# Patient Record
Sex: Male | Born: 1935 | Race: White | Hispanic: No | Marital: Married | Smoking: Former smoker
Health system: Southern US, Community
[De-identification: ages and names within clinical notes are randomized; demographics above are authoritative.]

## PROBLEM LIST (undated history)

## (undated) DIAGNOSIS — Z860100 Personal history of colon polyps, unspecified: Secondary | ICD-10-CM

## (undated) DIAGNOSIS — E782 Mixed hyperlipidemia: Secondary | ICD-10-CM

## (undated) DIAGNOSIS — J449 Chronic obstructive pulmonary disease, unspecified: Secondary | ICD-10-CM

## (undated) DIAGNOSIS — M1990 Unspecified osteoarthritis, unspecified site: Secondary | ICD-10-CM

## (undated) DIAGNOSIS — Q619 Cystic kidney disease, unspecified: Secondary | ICD-10-CM

## (undated) DIAGNOSIS — R079 Chest pain, unspecified: Secondary | ICD-10-CM

## (undated) DIAGNOSIS — I2510 Atherosclerotic heart disease of native coronary artery without angina pectoris: Secondary | ICD-10-CM

## (undated) DIAGNOSIS — Z8719 Personal history of other diseases of the digestive system: Secondary | ICD-10-CM

## (undated) DIAGNOSIS — I219 Acute myocardial infarction, unspecified: Secondary | ICD-10-CM

## (undated) DIAGNOSIS — J42 Unspecified chronic bronchitis: Secondary | ICD-10-CM

## (undated) DIAGNOSIS — C4490 Unspecified malignant neoplasm of skin, unspecified: Secondary | ICD-10-CM

## (undated) DIAGNOSIS — R55 Syncope and collapse: Secondary | ICD-10-CM

## (undated) DIAGNOSIS — G4730 Sleep apnea, unspecified: Secondary | ICD-10-CM

## (undated) DIAGNOSIS — K219 Gastro-esophageal reflux disease without esophagitis: Secondary | ICD-10-CM

## (undated) HISTORY — DX: Personal history of colonic polyps: Z86.010

## (undated) HISTORY — DX: Mixed hyperlipidemia: E78.2

## (undated) HISTORY — DX: Chest pain, unspecified: R07.9

## (undated) HISTORY — PX: CORONARY ANGIOPLASTY: SHX604

## (undated) HISTORY — DX: Personal history of colon polyps, unspecified: Z86.0100

## (undated) HISTORY — PX: SKIN CANCER EXCISION: SHX779

---

## 1996-09-09 HISTORY — PX: CHOLECYSTECTOMY OPEN: SUR202

## 1996-09-09 HISTORY — PX: COLECTOMY: SHX59

## 1999-10-22 ENCOUNTER — Encounter (INDEPENDENT_AMBULATORY_CARE_PROVIDER_SITE_OTHER): Payer: Self-pay | Admitting: Specialist

## 1999-10-22 ENCOUNTER — Other Ambulatory Visit: Admission: RE | Admit: 1999-10-22 | Discharge: 1999-10-22 | Payer: Self-pay | Admitting: Gastroenterology

## 1999-12-05 ENCOUNTER — Inpatient Hospital Stay (HOSPITAL_COMMUNITY): Admission: RE | Admit: 1999-12-05 | Discharge: 1999-12-10 | Payer: Self-pay | Admitting: General Surgery

## 1999-12-05 ENCOUNTER — Encounter (INDEPENDENT_AMBULATORY_CARE_PROVIDER_SITE_OTHER): Payer: Self-pay | Admitting: Specialist

## 2000-01-29 ENCOUNTER — Ambulatory Visit (HOSPITAL_COMMUNITY): Admission: RE | Admit: 2000-01-29 | Discharge: 2000-01-30 | Payer: Self-pay | Admitting: Urology

## 2004-01-27 ENCOUNTER — Encounter: Admission: RE | Admit: 2004-01-27 | Discharge: 2004-01-27 | Payer: Self-pay | Admitting: Orthopedic Surgery

## 2008-04-05 ENCOUNTER — Encounter: Admission: RE | Admit: 2008-04-05 | Discharge: 2008-04-05 | Payer: Self-pay | Admitting: Family Medicine

## 2008-04-25 ENCOUNTER — Ambulatory Visit (HOSPITAL_COMMUNITY): Admission: RE | Admit: 2008-04-25 | Discharge: 2008-04-25 | Payer: Self-pay | Admitting: Urology

## 2008-12-13 ENCOUNTER — Encounter (INDEPENDENT_AMBULATORY_CARE_PROVIDER_SITE_OTHER): Payer: Self-pay | Admitting: *Deleted

## 2009-01-09 ENCOUNTER — Ambulatory Visit: Payer: Self-pay | Admitting: Gastroenterology

## 2009-01-25 ENCOUNTER — Ambulatory Visit: Payer: Self-pay | Admitting: Gastroenterology

## 2010-06-14 ENCOUNTER — Ambulatory Visit: Payer: Self-pay | Admitting: Internal Medicine

## 2010-06-14 DIAGNOSIS — Z86010 Personal history of colonic polyps: Secondary | ICD-10-CM

## 2010-06-14 LAB — CONVERTED CEMR LAB
Albumin: 4.2 g/dL (ref 3.5–5.2)
Alkaline Phosphatase: 64 units/L (ref 39–117)
Basophils Absolute: 0 10*3/uL (ref 0.0–0.1)
Basophils Relative: 0.7 % (ref 0.0–3.0)
CO2: 28 meq/L (ref 19–32)
Calcium: 9.4 mg/dL (ref 8.4–10.5)
Chloride: 103 meq/L (ref 96–112)
Cholesterol: 188 mg/dL (ref 0–200)
Eosinophils Absolute: 0.1 10*3/uL (ref 0.0–0.7)
Glucose, Bld: 100 mg/dL — ABNORMAL HIGH (ref 70–99)
HCT: 41.3 % (ref 39.0–52.0)
Hemoglobin: 14 g/dL (ref 13.0–17.0)
Lymphocytes Relative: 41.5 % (ref 12.0–46.0)
Lymphs Abs: 2.1 10*3/uL (ref 0.7–4.0)
MCHC: 34 g/dL (ref 30.0–36.0)
MCV: 97.9 fL (ref 78.0–100.0)
Monocytes Absolute: 0.4 10*3/uL (ref 0.1–1.0)
Neutro Abs: 2.3 10*3/uL (ref 1.4–7.7)
RBC: 4.22 M/uL (ref 4.22–5.81)
RDW: 13.7 % (ref 11.5–14.6)
Sodium: 138 meq/L (ref 135–145)
TSH: 1.7 microintl units/mL (ref 0.35–5.50)
Total Protein: 6.7 g/dL (ref 6.0–8.3)

## 2010-08-16 ENCOUNTER — Encounter: Payer: Self-pay | Admitting: Internal Medicine

## 2010-09-09 DIAGNOSIS — I219 Acute myocardial infarction, unspecified: Secondary | ICD-10-CM

## 2010-09-09 HISTORY — PX: CORONARY ARTERY BYPASS GRAFT: SHX141

## 2010-09-09 HISTORY — DX: Acute myocardial infarction, unspecified: I21.9

## 2010-09-09 HISTORY — PX: CARDIAC CATHETERIZATION: SHX172

## 2010-09-25 ENCOUNTER — Inpatient Hospital Stay (HOSPITAL_COMMUNITY)
Admission: EM | Admit: 2010-09-25 | Discharge: 2010-10-01 | Payer: Self-pay | Source: Home / Self Care | Attending: Thoracic Surgery (Cardiothoracic Vascular Surgery) | Admitting: Thoracic Surgery (Cardiothoracic Vascular Surgery)

## 2010-09-26 ENCOUNTER — Encounter: Payer: Self-pay | Admitting: Thoracic Surgery (Cardiothoracic Vascular Surgery)

## 2010-09-26 LAB — CK TOTAL AND CKMB (NOT AT ARMC)
CK, MB: 6.2 ng/mL (ref 0.3–4.0)
Relative Index: 3.1 — ABNORMAL HIGH (ref 0.0–2.5)
Total CK: 197 U/L (ref 7–232)

## 2010-09-26 LAB — CBC
HCT: 39.1 % (ref 39.0–52.0)
Hemoglobin: 13.3 g/dL (ref 13.0–17.0)
MCH: 31.6 pg (ref 26.0–34.0)
MCHC: 34 g/dL (ref 30.0–36.0)
MCV: 92.9 fL (ref 78.0–100.0)
Platelets: 164 10*3/uL (ref 150–400)
RBC: 4.21 MIL/uL — ABNORMAL LOW (ref 4.22–5.81)
RDW: 12.9 % (ref 11.5–15.5)
WBC: 5.4 10*3/uL (ref 4.0–10.5)

## 2010-09-26 LAB — DIFFERENTIAL
Basophils Absolute: 0.1 10*3/uL (ref 0.0–0.1)
Basophils Relative: 1 % (ref 0–1)
Eosinophils Absolute: 0.2 10*3/uL (ref 0.0–0.7)
Eosinophils Relative: 3 % (ref 0–5)
Lymphocytes Relative: 35 % (ref 12–46)
Lymphs Abs: 1.9 10*3/uL (ref 0.7–4.0)
Monocytes Absolute: 0.5 10*3/uL (ref 0.1–1.0)
Monocytes Relative: 9 % (ref 3–12)
Neutro Abs: 2.8 10*3/uL (ref 1.7–7.7)
Neutrophils Relative %: 52 % (ref 43–77)

## 2010-09-26 LAB — BASIC METABOLIC PANEL
BUN: 13 mg/dL (ref 6–23)
CO2: 28 mEq/L (ref 19–32)
Calcium: 9 mg/dL (ref 8.4–10.5)
Chloride: 103 mEq/L (ref 96–112)
Creatinine, Ser: 0.77 mg/dL (ref 0.4–1.5)
GFR calc Af Amer: >60 mL/min (ref >60)
GFR calc non Af Amer: >60 mL/min (ref >60)
Glucose, Bld: 117 mg/dL — ABNORMAL HIGH (ref 70–99)
Potassium: 4 mEq/L (ref 3.5–5.1)
Sodium: 138 mEq/L (ref 135–145)

## 2010-09-26 LAB — PROTIME-INR
INR: 0.97 (ref 0.00–1.49)
Prothrombin Time: 13.1 seconds (ref 11.6–15.2)

## 2010-09-26 LAB — APTT: aPTT: 33 seconds (ref 24–37)

## 2010-09-26 LAB — TROPONIN I: Troponin I: 0.08 ng/mL — ABNORMAL HIGH (ref 0.00–0.06)

## 2010-09-28 ENCOUNTER — Encounter: Payer: Self-pay | Admitting: Cardiovascular Disease

## 2010-10-01 LAB — GLUCOSE, CAPILLARY
Glucose-Capillary: 105 mg/dL — ABNORMAL HIGH (ref 70–99)
Glucose-Capillary: 107 mg/dL — ABNORMAL HIGH (ref 70–99)
Glucose-Capillary: 127 mg/dL — ABNORMAL HIGH (ref 70–99)
Glucose-Capillary: 98 mg/dL (ref 70–99)
Glucose-Capillary: 151 mg/dL — ABNORMAL HIGH (ref 70–99)

## 2010-10-01 LAB — POCT I-STAT 4, (NA,K, GLUC, HGB,HCT): Hemoglobin: 8.8 g/dL — ABNORMAL LOW (ref 13.0–17.0)

## 2010-10-01 LAB — POCT I-STAT 3, ART BLOOD GAS (G3+)
Acid-Base Excess: 1 mmol/L (ref 0.0–2.0)
Bicarbonate: 26.4 mEq/L — ABNORMAL HIGH (ref 20.0–24.0)
Bicarbonate: 25.9 mEq/L — ABNORMAL HIGH (ref 20.0–24.0)
O2 Saturation: 99 %
Patient temperature: 37.4
TCO2: 28 mmol/L (ref 0–100)
pCO2 arterial: 43.1 mmHg (ref 35.0–45.0)
pH, Arterial: 7.385 (ref 7.350–7.450)
pO2, Arterial: 161 mmHg — ABNORMAL HIGH (ref 80.0–100.0)

## 2010-10-01 LAB — BASIC METABOLIC PANEL
BUN: 11 mg/dL (ref 6–23)
BUN: 11 mg/dL (ref 6–23)
CO2: 26 mEq/L (ref 19–32)
CO2: 27 mEq/L (ref 19–32)
Calcium: 8.4 mg/dL (ref 8.4–10.5)
Calcium: 8.6 mg/dL (ref 8.4–10.5)
Chloride: 103 mEq/L (ref 96–112)
Creatinine, Ser: 0.86 mg/dL (ref 0.4–1.5)
GFR calc Af Amer: >60 mL/min (ref >60)
GFR calc non Af Amer: >60 mL/min (ref >60)
Glucose, Bld: 123 mg/dL — ABNORMAL HIGH (ref 70–99)
Glucose, Bld: 135 mg/dL — ABNORMAL HIGH (ref 70–99)
Potassium: 4 mEq/L (ref 3.5–5.1)
Sodium: 140 mEq/L (ref 135–145)
Sodium: 139 mEq/L (ref 135–145)

## 2010-10-01 LAB — BLOOD GAS, ARTERIAL
Acid-Base Excess: 2.4 mmol/L — ABNORMAL HIGH (ref 0.0–2.0)
Bicarbonate: 26.8 mEq/L — ABNORMAL HIGH (ref 20.0–24.0)
Drawn by: 331001
FIO2: 0.21 %
O2 Saturation: 80.3 %
Patient temperature: 98.6
TCO2: 28.1 mmol/L (ref 0–100)
pCO2 arterial: 44.2 mmHg (ref 35.0–45.0)
pH, Arterial: 7.399 (ref 7.350–7.450)
pO2, Arterial: 44.5 mmHg — ABNORMAL LOW (ref 80.0–100.0)

## 2010-10-01 LAB — COMPREHENSIVE METABOLIC PANEL
ALT: 17 U/L (ref 0–53)
AST: 19 U/L (ref 0–37)
Albumin: 4.2 g/dL (ref 3.5–5.2)
Alkaline Phosphatase: 63 U/L (ref 39–117)
BUN: 14 mg/dL (ref 6–23)
CO2: 29 mEq/L (ref 19–32)
Calcium: 9.6 mg/dL (ref 8.4–10.5)
Chloride: 101 mEq/L (ref 96–112)
Creatinine, Ser: 0.94 mg/dL (ref 0.4–1.5)
GFR calc Af Amer: >60 mL/min (ref >60)
GFR calc non Af Amer: >60 mL/min (ref >60)
Glucose, Bld: 122 mg/dL — ABNORMAL HIGH (ref 70–99)
Potassium: 4.7 mEq/L (ref 3.5–5.1)
Sodium: 141 mEq/L (ref 135–145)
Total Bilirubin: 0.7 mg/dL (ref 0.3–1.2)
Total Protein: 6.9 g/dL (ref 6.0–8.3)

## 2010-10-01 LAB — URINALYSIS, ROUTINE W REFLEX MICROSCOPIC
Bilirubin Urine: NEGATIVE
Hgb urine dipstick: NEGATIVE
Ketones, ur: NEGATIVE mg/dL
Nitrite: NEGATIVE
Protein, ur: NEGATIVE mg/dL
Specific Gravity, Urine: 1.012 (ref 1.005–1.030)
Urine Glucose, Fasting: NEGATIVE mg/dL
Urobilinogen, UA: 0.2 mg/dL (ref 0.0–1.0)
pH: 6 (ref 5.0–8.0)

## 2010-10-01 LAB — CBC
HCT: 42.8 % (ref 39.0–52.0)
HCT: 25.9 % — ABNORMAL LOW (ref 39.0–52.0)
HCT: 25.4 % — ABNORMAL LOW (ref 39.0–52.0)
HCT: 29 % — ABNORMAL LOW (ref 39.0–52.0)
HCT: 27.8 % — ABNORMAL LOW (ref 39.0–52.0)
HCT: 29.8 % — ABNORMAL LOW (ref 39.0–52.0)
Hemoglobin: 14.4 g/dL (ref 13.0–17.0)
Hemoglobin: 8.8 g/dL — ABNORMAL LOW (ref 13.0–17.0)
Hemoglobin: 8.6 g/dL — ABNORMAL LOW (ref 13.0–17.0)
Hemoglobin: 9.4 g/dL — ABNORMAL LOW (ref 13.0–17.0)
Hemoglobin: 10.2 g/dL — ABNORMAL LOW (ref 13.0–17.0)
MCH: 31.5 pg (ref 26.0–34.0)
MCH: 31.7 pg (ref 26.0–34.0)
MCH: 31.5 pg (ref 26.0–34.0)
MCH: 31.4 pg (ref 26.0–34.0)
MCH: 31.8 pg (ref 26.0–34.0)
MCH: 32.5 pg (ref 26.0–34.0)
MCHC: 33.6 g/dL (ref 30.0–36.0)
MCHC: 34 g/dL (ref 30.0–36.0)
MCHC: 33.9 g/dL (ref 30.0–36.0)
MCHC: 33.8 g/dL (ref 30.0–36.0)
MCHC: 33.8 g/dL (ref 30.0–36.0)
MCHC: 34.2 g/dL (ref 30.0–36.0)
MCV: 93.7 fL (ref 78.0–100.0)
MCV: 93.2 fL (ref 78.0–100.0)
MCV: 93 fL (ref 78.0–100.0)
MCV: 92.9 fL (ref 78.0–100.0)
MCV: 93.9 fL (ref 78.0–100.0)
MCV: 94.9 fL (ref 78.0–100.0)
Platelets: 183 10*3/uL (ref 150–400)
Platelets: 87 10*3/uL — ABNORMAL LOW (ref 150–400)
Platelets: 109 10*3/uL — ABNORMAL LOW (ref 150–400)
Platelets: 117 10*3/uL — ABNORMAL LOW (ref 150–400)
RBC: 4.57 MIL/uL (ref 4.22–5.81)
RBC: 2.78 MIL/uL — ABNORMAL LOW (ref 4.22–5.81)
RBC: 3.14 MIL/uL — ABNORMAL LOW (ref 4.22–5.81)
RDW: 12.9 % (ref 11.5–15.5)
RDW: 12.8 % (ref 11.5–15.5)
RDW: 12.8 % (ref 11.5–15.5)
RDW: 12.9 % (ref 11.5–15.5)
RDW: 13.1 % (ref 11.5–15.5)
RDW: 13.3 % (ref 11.5–15.5)
WBC: 5.2 10*3/uL (ref 4.0–10.5)
WBC: 4.9 10*3/uL (ref 4.0–10.5)
WBC: 7.2 10*3/uL (ref 4.0–10.5)
WBC: 8.4 10*3/uL (ref 4.0–10.5)

## 2010-10-01 LAB — LIPID PANEL
Cholesterol: 161 mg/dL (ref 0–200)
Cholesterol: 178 mg/dL (ref 0–200)
HDL: 46 mg/dL (ref >39)
HDL: 52 mg/dL (ref >39)
LDL Cholesterol: 109 mg/dL — ABNORMAL HIGH (ref 0–99)
LDL Cholesterol: 106 mg/dL — ABNORMAL HIGH (ref 0–99)
Total CHOL/HDL Ratio: 3.5 RATIO
Total CHOL/HDL Ratio: 3.4 RATIO
Triglycerides: 30 mg/dL (ref <150)
Triglycerides: 99 mg/dL (ref <150)
VLDL: 6 mg/dL (ref 0–40)
VLDL: 20 mg/dL (ref 0–40)

## 2010-10-01 LAB — HEMOGLOBIN A1C
Hgb A1c MFr Bld: 6.4 % — ABNORMAL HIGH (ref <5.7)
Hgb A1c MFr Bld: 6.2 % — ABNORMAL HIGH (ref <5.7)
Mean Plasma Glucose: 137 mg/dL — ABNORMAL HIGH (ref <117)
Mean Plasma Glucose: 131 mg/dL — ABNORMAL HIGH (ref <117)

## 2010-10-01 LAB — POCT I-STAT, CHEM 8
BUN: 9 mg/dL (ref 6–23)
BUN: 14 mg/dL (ref 6–23)
Calcium, Ion: 1.22 mmol/L (ref 1.12–1.32)
Calcium, Ion: 1.31 mmol/L (ref 1.12–1.32)
Chloride: 106 mEq/L (ref 96–112)
Chloride: 100 mEq/L (ref 96–112)
Creatinine, Ser: 0.6 mg/dL (ref 0.4–1.5)
Glucose, Bld: 145 mg/dL — ABNORMAL HIGH (ref 70–99)
TCO2: 25 mmol/L (ref 0–100)

## 2010-10-01 LAB — PREPARE PLATELETS: Unit division: 0

## 2010-10-01 LAB — MAGNESIUM: Magnesium: 2.2 mg/dL (ref 1.5–2.5)

## 2010-10-01 LAB — TYPE AND SCREEN
ABO/RH(D): O POS
Antibody Screen: NEGATIVE

## 2010-10-01 LAB — CARDIAC PANEL(CRET KIN+CKTOT+MB+TROPI)
CK, MB: 5.5 ng/mL — ABNORMAL HIGH (ref 0.3–4.0)
Relative Index: 3.3 — ABNORMAL HIGH (ref 0.0–2.5)
Total CK: 168 U/L (ref 7–232)
Troponin I: 0.19 ng/mL — ABNORMAL HIGH (ref 0.00–0.06)

## 2010-10-01 LAB — HEMOGLOBIN AND HEMATOCRIT, BLOOD
HCT: 26.6 % — ABNORMAL LOW (ref 39.0–52.0)
Hemoglobin: 9 g/dL — ABNORMAL LOW (ref 13.0–17.0)

## 2010-10-01 LAB — ABO/RH: ABO/RH(D): O POS

## 2010-10-01 LAB — PROTIME-INR
INR: 1.37 (ref 0.00–1.49)
Prothrombin Time: 17.1 seconds — ABNORMAL HIGH (ref 11.6–15.2)

## 2010-10-01 LAB — PLATELET COUNT: Platelets: 99 10*3/uL — ABNORMAL LOW (ref 150–400)

## 2010-10-01 LAB — APTT: aPTT: 37 seconds (ref 24–37)

## 2010-10-01 NOTE — Op Note (Signed)
Michael Johnston, Michael Johnston             ACCOUNT NO.:  [id number]  MEDICAL RECORD NO.:  [id number]          PATIENT TYPE:  INP  LOCATION:  2313                         FACILITY:  [facility]  PHYSICIAN:  Salvatore Decent. Cornelius Moras, M.D. DATE OF BIRTH:  25-Jan-1932  DATE OF PROCEDURE:  09/27/2010 DATE OF DISCHARGE:                              OPERATIVE REPORT   PREOPERATIVE DIAGNOSIS:  Severe two-vessel coronary artery disease with acute coronary syndrome.  POSTOPERATIVE DIAGNOSIS:  Severe two-vessel coronary artery disease with acute coronary syndrome.  PROCEDURE:  Median sternotomy for coronary artery bypass grafting x3 (left internal mammary artery to distal left anterior descending coronary artery, saphenous vein graft to first diagonal branch, saphenous vein graft to first obtuse marginal branch, endoscopic saphenous vein harvest from right thigh).  SURGEON:  Salvatore Decent. Cornelius Moras, MD  ASSISTANT:  Rowe Clack, PA-C  ANESTHESIA:  Guadalupe Maple, MD  BRIEF CLINICAL NOTE:  The patient is a 77 year old gentleman with no previous cardiac risk factors who presents with unstable angina.  The patient is admitted to the hospital and cardiac enzymes ruled in for a mild non-ST-segment elevation myocardial infarction.  Cardiac catheterization performed by Dr. Lorine Bears demonstrates severe two- vessel coronary artery disease with preserved left ventricular function. Coronary anatomy is relatively unfavorable for percutaneous coronary intervention.  A full consultation note has been dictated previously. The patient and his family understand and accept all associated risks of surgery and desire to proceed as described.  OPERATIVE FINDINGS: 1. Normal left ventricular function. 2. Good-quality left internal mammary artery and saphenous vein     conduit for grafting. 3. Good-quality target vessels for grafting.  OPERATIVE PROCEDURE IN DETAIL:  The patient was brought to the operating room on the  above-mentioned date and central monitoring was established by the Anesthesia Team under the care and direction of Dr. Kipp Brood. Specifically, a Swan-Ganz catheter was placed through the right internal jugular approach.  A radial arterial line was placed.  Intravenous antibiotics were administered.  Following induction with general endotracheal anesthesia, a Foley catheter was placed.  The patient's chest, abdomen, both groins, and both lower extremities were prepared and draped in a sterile manner.  Baseline transesophageal echocardiogram was performed by Dr. Noreene Larsson.  This demonstrates normal left ventricular function.  No other significant abnormalities are noted.  A median sternotomy incision was performed and the left internal mammary artery was dissected from the chest wall and prepared for bypass grafting.  The left internal mammary artery was a good-quality conduit. Simultaneously, saphenous vein was obtained from the patient's right thigh using endoscopic vein harvest technique through a small incision made just below the right knee.  The saphenous vein was a good-quality conduit.  After the saphenous vein has been removed, the small incision just below the knee was closed with absorbable suture.  Following systemic heparinization, the left internal mammary artery was transected distally and noted to have excellent flow.  The pericardium was opened.  The ascending aorta was normal in appearance.  The ascending aorta and right atrium were cannulated for cardiopulmonary bypass.  Cardiopulmonary bypass was begun and the surface of the heart was  inspected.  Distal target vessels were selected for coronary bypass grafting.  A temperature probe was placed in the left ventricular septum.  A cardioplegic cannula was placed in the ascending aorta.  The patient is allowed to cool passively to 32 degrees systemic temperature.  The aortic crossclamp was applied and cold  blood cardioplegia was delivered in antegrade fashion through the aortic root. Iced saline slush was applied for topical hypothermia.  The initial cardioplegic arrest and myocardial cooling was notably excellent. Repeat doses of cardioplegia are administered intermittently throughout the entire crossclamp portion of the operation through the aortic root and down the subsequently placed vein graft to maintain completely flat electrocardiogram and left ventricular septal myocardial temperature below 15 degrees centigrade.  Myocardial protection was felt to be excellent.  The following distal coronary anastomoses were performed: 1. The first obtuse marginal branch of the left circumflex coronary     artery was grafted with a saphenous vein graft in an end-to-side     fashion.  This vessel was a large bifurcating obtuse marginal     branch.  The medial subbranch was grafted.  The site of distal     grafting measures 2 mm in diameter and was of good quality. 2. The first diagonal branch of the left anterior descending coronary     artery was grafted with a saphenous vein graft in an end-to-side     fashion.  This vessel measured 1.5 mm in diameter and was a fair-     quality target vessel for grafting.  There was diffuse plaque     posteriorly. 3. The distal left anterior descending coronary artery was grafted     with left internal mammary artery in an end-to-side fashion.  This     vessel measures 2.0 mm in diameter and was a good-quality target     vessel for grafting.  Both proximal saphenous vein anastomoses were performed directly to the ascending aorta prior to removal of the aortic crossclamp.  The left ventricular septal temperature rises rapidly with reperfusion of the left internal mammary artery graft.  The aortic crossclamp was removed after total crossclamp time of 52 minutes.  The heart was cardioverted and normal sinus rhythm was resumed.  All proximal and distal  coronary anastomoses were inspected for hemostasis and appropriate graft orientation.  Epicardial pacing wires were fixed to the right ventricular outflow tract into the right atrial appendage.  The patient was rewarmed to 37 degrees centigrade temperature.  The patient was weaned from cardiopulmonary bypass without difficulty.  The patient's rhythm at separation from bypass is normal sinus rhythm.  Atrial pacing was employed to increase the heart rate.  No inotropic support was required.  Total cardiopulmonary bypass time for the operation was 68 minutes.  Followup transesophageal echocardiogram performed by Dr. Noreene Larsson after separation from bypass demonstrates normal left ventricular function.  The venous and arterial cannulae were removed uneventfully.  Protamine was administered to reverse anticoagulation.  The mediastinum and the left chest were irrigated with saline solution.  Meticulous surgical hemostasis was ascertained.  There was mild coagulopathy.  The patient was transfused 1 pack of platelets due to coagulopathy with thrombocytopenia.  The mediastinum and left pleural space were drained with 3 chest tubes placed through separate stab incisions inferiorly.  The median sternotomy was closed in routine fashion.  The sternum was closed using double-strength sternal wire.  Soft tissues anterior to the sternum were closed in multiple layers and the skin was closed with a  running subcuticular skin closure.  The patient tolerated the procedure well and was transported to Surgical Intensive Care Unit in stable condition.  There were no intraoperative complications.  All sponge, instrument, and needle counts were verified correct at completion of the operation.     Salvatore Decent. Cornelius Moras, M.D.     CHO/MEDQ  D:  09/27/2010  T:  09/28/2010  Job:  [id number]  cc:   Lorine Bears, MD Noralyn Pick. Eden Emms, MD, [facility] Gordy Savers, MD  Electronically Signed by Tressie Stalker M.D. on  10/01/2010 07:49:33 AM

## 2010-10-02 LAB — BASIC METABOLIC PANEL
BUN: 12 mg/dL (ref 6–23)
BUN: 16 mg/dL (ref 6–23)
BUN: 18 mg/dL (ref 6–23)
BUN: 18 mg/dL (ref 6–23)
CO2: 28 mEq/L (ref 19–32)
CO2: 29 mEq/L (ref 19–32)
CO2: 29 mEq/L (ref 19–32)
CO2: 29 mEq/L (ref 19–32)
Calcium: 8.2 mg/dL — ABNORMAL LOW (ref 8.4–10.5)
Calcium: 8.4 mg/dL (ref 8.4–10.5)
Calcium: 8.4 mg/dL (ref 8.4–10.5)
Chloride: 101 mEq/L (ref 96–112)
Chloride: 102 mEq/L (ref 96–112)
Chloride: 103 mEq/L (ref 96–112)
Chloride: 104 mEq/L (ref 96–112)
Creatinine, Ser: 0.81 mg/dL (ref 0.4–1.5)
Creatinine, Ser: 0.95 mg/dL (ref 0.4–1.5)
Creatinine, Ser: 0.88 mg/dL (ref 0.4–1.5)
Creatinine, Ser: 0.86 mg/dL (ref 0.4–1.5)
GFR calc Af Amer: >60 mL/min (ref >60)
GFR calc Af Amer: >60 mL/min (ref >60)
GFR calc Af Amer: >60 mL/min (ref >60)
GFR calc non Af Amer: >60 mL/min (ref >60)
GFR calc non Af Amer: >60 mL/min (ref >60)
GFR calc non Af Amer: >60 mL/min (ref >60)
Glucose, Bld: 123 mg/dL — ABNORMAL HIGH (ref 70–99)
Glucose, Bld: 162 mg/dL — ABNORMAL HIGH (ref 70–99)
Glucose, Bld: 178 mg/dL — ABNORMAL HIGH (ref 70–99)
Potassium: 4 mEq/L (ref 3.5–5.1)
Potassium: 4.3 mEq/L (ref 3.5–5.1)
Potassium: 4.1 mEq/L (ref 3.5–5.1)
Sodium: 137 mEq/L (ref 135–145)
Sodium: 138 mEq/L (ref 135–145)
Sodium: 138 mEq/L (ref 135–145)

## 2010-10-02 LAB — POCT I-STAT 4, (NA,K, GLUC, HGB,HCT)
Glucose, Bld: 100 mg/dL — ABNORMAL HIGH (ref 70–99)
Glucose, Bld: 159 mg/dL — ABNORMAL HIGH (ref 70–99)
HCT: 36 % — ABNORMAL LOW (ref 39.0–52.0)
HCT: 36 % — ABNORMAL LOW (ref 39.0–52.0)
HCT: 26 % — ABNORMAL LOW (ref 39.0–52.0)
HCT: 28 % — ABNORMAL LOW (ref 39.0–52.0)
Hemoglobin: 12.2 g/dL — ABNORMAL LOW (ref 13.0–17.0)
Hemoglobin: 8.5 g/dL — ABNORMAL LOW (ref 13.0–17.0)
Hemoglobin: 8.8 g/dL — ABNORMAL LOW (ref 13.0–17.0)
Potassium: 4.1 mEq/L (ref 3.5–5.1)
Potassium: 4.2 mEq/L (ref 3.5–5.1)
Potassium: 4.8 mEq/L (ref 3.5–5.1)
Sodium: 138 mEq/L (ref 135–145)
Sodium: 137 mEq/L (ref 135–145)
Sodium: 136 mEq/L (ref 135–145)

## 2010-10-02 LAB — CBC
HCT: 25 % — ABNORMAL LOW (ref 39.0–52.0)
HCT: 29.1 % — ABNORMAL LOW (ref 39.0–52.0)
HCT: 26.5 % — ABNORMAL LOW (ref 39.0–52.0)
Hemoglobin: 8.3 g/dL — ABNORMAL LOW (ref 13.0–17.0)
Hemoglobin: 9.5 g/dL — ABNORMAL LOW (ref 13.0–17.0)
Hemoglobin: 9.1 g/dL — ABNORMAL LOW (ref 13.0–17.0)
Hemoglobin: 8.9 g/dL — ABNORMAL LOW (ref 13.0–17.0)
MCH: 31.8 pg (ref 26.0–34.0)
MCH: 31.3 pg (ref 26.0–34.0)
MCH: 31.7 pg (ref 26.0–34.0)
MCH: 32 pg (ref 26.0–34.0)
MCHC: 33.2 g/dL (ref 30.0–36.0)
MCHC: 32.6 g/dL (ref 30.0–36.0)
MCHC: 33.6 g/dL (ref 30.0–36.0)
MCV: 95.8 fL (ref 78.0–100.0)
MCV: 95.7 fL (ref 78.0–100.0)
MCV: 94.1 fL (ref 78.0–100.0)
MCV: 95.3 fL (ref 78.0–100.0)
Platelets: 98 10*3/uL — ABNORMAL LOW (ref 150–400)
Platelets: 118 10*3/uL — ABNORMAL LOW (ref 150–400)
Platelets: 115 10*3/uL — ABNORMAL LOW (ref 150–400)
Platelets: 135 10*3/uL — ABNORMAL LOW (ref 150–400)
RBC: 2.61 MIL/uL — ABNORMAL LOW (ref 4.22–5.81)
RBC: 3.04 MIL/uL — ABNORMAL LOW (ref 4.22–5.81)
RBC: 2.87 MIL/uL — ABNORMAL LOW (ref 4.22–5.81)
RBC: 2.78 MIL/uL — ABNORMAL LOW (ref 4.22–5.81)
RDW: 13.3 % (ref 11.5–15.5)
RDW: 13.1 % (ref 11.5–15.5)
RDW: 13 % (ref 11.5–15.5)
WBC: 7.1 10*3/uL (ref 4.0–10.5)
WBC: 7 10*3/uL (ref 4.0–10.5)
WBC: 5.8 10*3/uL (ref 4.0–10.5)

## 2010-10-02 LAB — GLUCOSE, CAPILLARY: Glucose-Capillary: 160 mg/dL — ABNORMAL HIGH (ref 70–99)

## 2010-10-02 LAB — POCT I-STAT 3, ART BLOOD GAS (G3+)
TCO2: 26 mmol/L (ref 0–100)
pH, Arterial: 7.424 (ref 7.350–7.450)

## 2010-10-04 LAB — POCT ACTIVATED CLOTTING TIME: Activated Clotting Time: 270 seconds

## 2010-10-11 NOTE — Letter (Signed)
Summary: [facility]  [facility]   Imported By: Lennie Odor 08/29/2010 14:57:17  _____________________________________________________________________  External Attachment:    Type:   Image     Comment:   External Document

## 2010-10-11 NOTE — Letter (Signed)
Summary: [facility]  [facility]   Imported By: Maryln Gottron 09/28/2010 13:25:09  _____________________________________________________________________  External Attachment:    Type:   Image     Comment:   External Document

## 2010-10-11 NOTE — Assessment & Plan Note (Signed)
Summary: NEW MEDICARE PT//[facility]   Vital Signs:  Patient profile:   77 year old male Height:      71.5 inches Weight:      201 pounds BMI:     27.74 Temp:     98.2 degrees F oral BP sitting:   110 / 74  (right arm) Cuff size:   regular  Vitals Entered By: Duard Brady LPN (June 14, 2010 1:04 PM) CC: new to establish  Is Patient Diabetic? No   CC:  new to establish .  History of Present Illness: 34 -year-old patient who is seen today to establish with our practice.  He enjoys excellent health and takes no chronic medications.  He has not followed previously by [facility] family practice.  No concerns or complaints today.  Here for Medicare AWV:  1.   Risk factors based on Past M, S, F history:  no cardiovascular risk factors 2.   Physical Activities: remains quite active, plays occasional golf 3.   Depression/mood: no history of depression or mood disorder 4.   Hearing: no deficits 5.   ADL's: independent in all aspects of daily living 6.   Fall Risk: low 7.   Home Safety: no problems identified 8.   Height, weight, &visual acuity:height and weight are stable.  No change in visual acuity 9.   Counseling: heart healthy diet regular.  Exercise encouraged 10.   Labs ordered based on risk factors:  laboratory profile will be reviewed 11.           Referral Coordination- urology follow-up later this month as scheduled 12.           Care Plan- heart healthy diet, exercise regimen encouraged 13.            Cognitive Assessment- alert and oriented, with normal affect.  No history of cognitive impairment able to handle all executive functions without difficulty   Preventive Screening-Counseling & Management  Alcohol-Tobacco     Smoking Status: never  Allergies (verified): No Known Drug Allergies  Past History:  Past Medical History: Colonic polyps, hx of  Past Surgical History: status post partial colectomy and cholecystectomy, 1998 Derrell Lolling) colonoscopy.  2009  Jarold Motto) history penile implant Earlene Plater)  Family History: Reviewed history and no changes required.  father died age 55 mother died age 70 one sister died in her 24s due to lung cancer  Social History: Reviewed history and no changes required. Married Never Smoked ( no tobacco since teenage years) 6 children 15 grandchildren and 3 great-grandchildren Smoking Status:  never  Review of Systems  The patient denies anorexia, fever, weight loss, weight gain, vision loss, decreased hearing, hoarseness, chest pain, syncope, dyspnea on exertion, peripheral edema, prolonged cough, headaches, hemoptysis, abdominal pain, melena, hematochezia, severe indigestion/heartburn, hematuria, incontinence, genital sores, muscle weakness, suspicious skin lesions, transient blindness, difficulty walking, depression, unusual weight change, abnormal bleeding, enlarged lymph nodes, angioedema, breast masses, and testicular masses.    Physical Exam  General:  Well-developed,well-nourished,in no acute distress; alert,appropriate and cooperative throughout examination Head:  Normocephalic and atraumatic without obvious abnormalities. No apparent alopecia or balding. Eyes:  No corneal or conjunctival inflammation noted. EOMI. Perrla. Funduscopic exam benign, without hemorrhages, exudates or papilledema. Vision grossly normal. Ears:  External ear exam shows no significant lesions or deformities.  Otoscopic examination reveals clear canals, tympanic membranes are intact bilaterally without bulging, retraction, inflammation or discharge. Hearing is grossly normal bilaterally. Mouth:  Oral mucosa and oropharynx without lesions or exudates.  Teeth  in good repair. Neck:  No deformities, masses, or tenderness noted. Chest Wall:  No deformities, masses, tenderness or gynecomastia noted. Breasts:  No masses or gynecomastia noted Lungs:  Normal respiratory effort, chest expands symmetrically. Lungs are clear to  auscultation, no crackles or wheezes. Heart:  Normal rate and regular rhythm. S1 and S2 normal without gallop, murmur, click, rub or other extra sounds. Abdomen:  Bowel sounds positive,abdomen soft and non-tender without masses, organomegaly or hernias noted. Msk:  No deformity or scoliosis noted of thoracic or lumbar spine.   Pulses:  R and L carotid,radial,femoral,dorsalis pedis and posterior tibial pulses are full and equal bilaterally Extremities:  No clubbing, cyanosis, edema, or deformity noted with normal full range of motion of all joints.   Neurologic:  alert & oriented X3, strength normal in all extremities, sensation intact to light touch, gait normal, and finger-to-nose normal.   Skin:  Intact without suspicious lesions or rashes Cervical Nodes:  No lymphadenopathy noted Axillary Nodes:  No palpable lymphadenopathy Inguinal Nodes:  No significant adenopathy Psych:  Cognition and judgment appear intact. Alert and cooperative with normal attention span and concentration. No apparent delusions, illusions, hallucinations   Impression & Recommendations:  Problem # 1:  Preventive Health Care (ICD-V70.0)  Orders: Medicare -1st Annual Wellness Visit [phone]) Venipuncture (95621) TLB-BMP (Basic Metabolic Panel-BMET) (80048-METABOL) TLB-CBC Platelet - w/Differential (85025-CBCD) TLB-Hepatic/Liver Function Pnl (80076-HEPATIC) TLB-TSH (Thyroid Stimulating Hormone) (84443-TSH) TLB-Cholesterol, Total (82465-CHO) Specimen Handling (30865)  Complete Medication List: 1)  No Current Rx Meds   Other Orders: Influenza Vaccine MCR (78469)  Patient Instructions: 1)  urology follow-up as scheduled 2)  Limit your Sodium (Salt). 3)  It is important that you exercise regularly at least 20 minutes 5 times a week. If you develop chest pain, have severe difficulty breathing, or feel very tired , stop exercising immediately and seek medical attention.   Immunizations Administered:  Influenza  Vaccine # 1:    Vaccine Type: Fluvax MCR    Site: left deltoid    Mfr: GlaxoSmithKline    Dose: 0.5 ml    Route: IM    Given by: Duard Brady LPN    Exp. Date: 03/09/2011    Lot #: [id number]    VIS given: 04/03/10 version given June 14, 2010.    Physician counseled: yes  Flu Vaccine Consent Questions:    Do you have a history of severe allergic reactions to this vaccine? no    Any prior history of allergic reactions to egg and/or gelatin? no    Do you have a sensitivity to the preservative Thimersol? no    Do you have a past history of Guillan-Barre Syndrome? no    Do you currently have an acute febrile illness? no    Have you ever had a severe reaction to latex? no    Vaccine information given and explained to patient? yes

## 2010-10-19 ENCOUNTER — Ambulatory Visit
Admission: RE | Admit: 2010-10-19 | Discharge: 2010-10-19 | Disposition: A | Discharge status: Home / Self Care | Payer: MEDICARE | Source: Ambulatory Visit | Attending: Thoracic Surgery (Cardiothoracic Vascular Surgery) | Admitting: Thoracic Surgery (Cardiothoracic Vascular Surgery)

## 2010-10-19 ENCOUNTER — Other Ambulatory Visit: Payer: Self-pay | Admitting: Thoracic Surgery (Cardiothoracic Vascular Surgery)

## 2010-10-19 ENCOUNTER — Encounter: Payer: Self-pay | Admitting: Cardiovascular Disease

## 2010-10-19 ENCOUNTER — Encounter (INDEPENDENT_AMBULATORY_CARE_PROVIDER_SITE_OTHER): Payer: Self-pay | Admitting: Thoracic Surgery (Cardiothoracic Vascular Surgery)

## 2010-10-19 DIAGNOSIS — Z951 Presence of aortocoronary bypass graft: Secondary | ICD-10-CM

## 2010-10-19 DIAGNOSIS — I2510 Atherosclerotic heart disease of native coronary artery without angina pectoris: Secondary | ICD-10-CM

## 2010-10-19 NOTE — Assessment & Plan Note (Signed)
OFFICE VISIT  Michael, Johnston DOB:  August 24, 1932                                        October 19, 2010 CHART #:  [id number]  HISTORY OF PRESENT ILLNESS:  Michael Johnston returns for routine followup, status post coronary artery bypass grafting x3 on September 27, 2010, for severe two-vessel coronary artery disease with acute coronary syndrome. His postoperative recovery has been uncomplicated.  Since hospital discharge he is continued to improve uneventfully and he returns for routine followup today.  He has not yet seen Dr. Eden Emms, for followup office visit, but he has an appointment scheduled next week.  Medications remain unchanged from the time of hospital discharge and include aspirin, Lopressor, and Crestor.  Michael Johnston has not been using any sort of pain relievers other than Tylenol.  PHYSICAL EXAMINATION:  GENERAL:  Notable for a well-appearing male. VITAL SIGNS:  Blood pressure 111/76, pulse 62 and regular, and oxygen saturation 97% on room air. HEENT:  Unrevealing. CHEST:  Examination of chest is notable for median sternotomy incision that is healing nicely.  The sternum is stable on palpation.  Breath sounds are clear to auscultation and symmetrical bilaterally.  No wheezes, rales, or rhonchi noted. CARDIOVASCULAR:  Regular rate and rhythm.  No murmurs, rubs, or gallops are appreciated. ABDOMEN:  Soft and nontender. EXTREMITIES:  Warm and well perfused.  The small incision around the right knee from endoscopic vein harvest is healing nicely.  There is no lower extremity edema.  The remainder of his physical exam is unremarkable.  DIAGNOSTIC TEST:  Chest x-ray performed today at [facility], is reviewed.  This reveals clear lung fields bilaterally.  There are no significant pleural effusions.  All the sternal wires appear intact.  No other abnormalities are noted.  IMPRESSION:  Excellent progress following recent coronary  artery bypass grafting.  PLAN:  I have encouraged Michael Johnston to continue to gradually increase his physical activity with his primary limitation at this point remaining that he refrain from heavy lifting or strenuous use of his arms or shoulders for least another 2 months.  I have strongly encouraged him to get involve in the cardiac rehab program.  I think this would do him some good.  We have not made any changes in his current medications.  All of his questions have been addressed.  Overall he seems to be getting along quite nicely.  In the future, Michael Johnston, will call or return to see [place] as needed.  Salvatore Decent. Cornelius Moras, M.D. Electronically Signed  CHO/MEDQ  D:  10/19/2010  T:  10/19/2010  Job:  [id number]  cc:   Noralyn Pick. Eden Emms, MD, [facility] Lorine Bears, MD Gordy Savers, MD

## 2010-10-22 ENCOUNTER — Encounter: Payer: Self-pay | Admitting: Thoracic Surgery (Cardiothoracic Vascular Surgery)

## 2010-10-22 DIAGNOSIS — R079 Chest pain, unspecified: Secondary | ICD-10-CM | POA: Insufficient documentation

## 2010-10-22 DIAGNOSIS — K219 Gastro-esophageal reflux disease without esophagitis: Secondary | ICD-10-CM | POA: Insufficient documentation

## 2010-10-23 ENCOUNTER — Encounter (INDEPENDENT_AMBULATORY_CARE_PROVIDER_SITE_OTHER): Payer: MEDICARE | Admitting: Cardiovascular Disease

## 2010-10-23 ENCOUNTER — Encounter: Payer: Self-pay | Admitting: Cardiovascular Disease

## 2010-10-23 DIAGNOSIS — E782 Mixed hyperlipidemia: Secondary | ICD-10-CM | POA: Insufficient documentation

## 2010-10-23 DIAGNOSIS — I2510 Atherosclerotic heart disease of native coronary artery without angina pectoris: Secondary | ICD-10-CM

## 2010-10-31 NOTE — Assessment & Plan Note (Signed)
Summary: eph/2 weeks/per dr owens/post open heart surgery/mt appt conf...   CC:  check up.  History of Present Illness: Michael Johnston is seen post hosptial D/C for SSCP/SEMI after getting injections at the dentist office  September 27, 2010.  He was taken to the operating room wherehe underwent coronary artery bypass grafting x3, the following graftswere placed:    Left internal mammary artery to the distal LAD, coronary artery a     saphenous vein graft to the first diagonal branch, and a saphenous     vein graft to the first obtuse marginal branch.  He had endoscopic     saphenous vein harvest from the right thigh.  He tolerated     procedure well, was taken to the surgical intensive care unit in     stable condition.  Operative findings showed normal left     ventricular function, good-quality conduits, good quality targets.  Post op course was unremarkable.  Has had some bronchitis.  Needs lipid and liver in 3 months.  Denies SSCP, palpitaitons, edema or dyspnea.  Has seen Dr. Cornelius Moras in F/u with normal post op CXR  Current Problems (verified): 1)  Chest Pain  (ICD-786.50) 2)  Hiatal Hernia, Hx of  (ICD-V12.79) 3)  Preventive Health Care  (ICD-V70.0) 4)  Colonic Polyps, Hx of  (ICD-V12.72)  Current Medications (verified): 1)  Crestor 10 Mg Tabs (Rosuvastatin Calcium) .... Take One Tablet By Mouth Daily. 2)  Aspirin Ec 325 Mg Tbec (Aspirin) .... Take One Tablet By Mouth Daily 3)  Metoprolol Tartrate 25 Mg Tabs (Metoprolol Tartrate) .... Take One Tablet By Mouth Twice A Day  Allergies (verified): No Known Drug Allergies  Past History:  Past Medical History: Last updated: 10/22/2010 CHEST PAIN  HIATAL HERNIA, HX OF PREVENTIVE HEALTH CARE  COLONIC POLYPS, HX OF   Past Surgical History: Last updated: 10/22/2010 Median sternotomy for coronary artery bypass grafting x3  (left internal mammary artery to distal left anterior descending  coronary artery, saphenous vein graft to first  diagonal branch,  saphenous vein graft to first obtuse marginal branch, endoscopic  saphenous vein harvest from right thigh). SURGEON:  Salvatore Decent. Cornelius Moras, MD  ASSISTANT:  Rowe Clack, PA-C  ANESTHESIA:  Guadalupe Maple, MD   status post partial colectomy and cholecystectomy, 1998 Derrell Lolling) colonoscopy.  2009 Jarold Motto) history penile implant Earlene Plater)  Family History: Last updated: 2010/07/07  father died age 77 mother died age 51 one sister died in her 74s due to lung cancer  Social History: Last updated: July 07, 2010 Married Never Smoked ( no tobacco since teenage years) 6 children 15 grandchildren and 3 great-grandchildren  Review of Systems       Denies fever, malais, weight loss, blurry vision, decreased visual acuity, cough, sputum, SOB, hemoptysis, pleuritic pain, palpitaitons, heartburn, abdominal pain, melena, lower extremity edema, claudication, or rash.   Vital Signs:  Patient profile:   77 year old male Height:      71 inches Weight:      199 pounds BMI:     27.86 Pulse rate:   61 / minute Resp:     14 per minute BP sitting:   119 / 73  (left arm)  Vitals Entered By: Kem Parkinson (October 23, 2010 1:32 PM)  Physical Exam  General:  Affect appropriate Healthy:  appears stated age HEENT: normal Neck supple with no adenopathy JVP normal no bruits no thyromegaly Lungs clear with no wheezing and good diaphragmatic motion Heart:  S1/S2 no murmur,rub, gallop or  click PMI normal Abdomen: benighn, BS positve, no tenderness, no AAA no bruit.  No HSM or HJR Distal pulses intact with no bruits No edema Neuro non-focal Skin warm and dry Rigth vein harvest site A    Impression & Recommendations:  Problem # 1:  CHEST PAIN (ICD-786.50) Assessment Unchanged Severe 2VD wit CABG 1/19 by Dr Cornelius Moras.  Continue ASA and BB His updated medication list for this problem includes:    Aspirin Ec 325 Mg Tbec (Aspirin) .Marland Kitchen... Take one tablet by mouth daily    Metoprolol  Tartrate 25 Mg Tabs (Metoprolol tartrate) .Marland Kitchen... Take one tablet by mouth twice a day  Problem # 2:  MIXED HYPERLIPIDEMIA (ICD-272.2) Continue statin.  Target LDL 75 or less.  Labs in 3 months His updated medication list for this problem includes:    Crestor 10 Mg Tabs (Rosuvastatin calcium) .Marland Kitchen... Take one tablet by mouth daily.  Patient Instructions: 1)  Your physician recommends that you schedule a follow-up appointment in: 3 MONTHS

## 2010-11-01 NOTE — Procedures (Signed)
NAMEBERNABE, DORCE             ACCOUNT NO.:  [id number]  MEDICAL RECORD NO.:  [id number]          PATIENT TYPE:  INP  LOCATION:  6531                         FACILITY:  [facility]  PHYSICIAN:  Lorine Bears, MD     DATE OF BIRTH:  [birth year]  DATE OF PROCEDURE:  09/26/2010 DATE OF DISCHARGE:                           CARDIAC CATHETERIZATION   PROCEDURES PERFORMED: 1. Left heart catheterization. 2. Coronary angiography. 3. Left ventricular angiography. 4. LAD pressure flow wire analysis.  DIAGNOSIS AND CLINICAL HISTORY:  Mr. Current is a 77 year old gentleman with no previous cardiac history.  He is a previous smoker and otherwise does not have any other chronic medical conditions.  He presented to the emergency room with classic symptoms of substernal chest tightness, which lasted for more than an hour.  His electrocardiogram showed no acute changes.  He was ultimately found to have elevated troponin consistent with non-ST elevation myocardial infarction.  Due to his presentation, cardiac catheterization and possible coronary intervention were recommended.  Risks, benefits, and alternatives were discussed with the patient.  ACCESS:  Right radial artery.  STUDY DETAILS:  A standard informed consent was obtained.  The patient was given fentanyl and Versed for sedation.  The right radial area was prepped in a sterile fashion.  It was anesthetized with 1% lidocaine.  A 5-French sheath was placed in the right radial artery.  He was given 4000 units of unfractionated heparin intravenously.  3 mg of verapamil was given through the arterial sheath.  Coronary angiography was performed with TIG catheter, which involved the left main coronary artery, but could not engage the right coronary artery with it.  Thus, for the right coronary artery, I used a JR-4 catheter.  A Pigtail catheter was used for LV angiography.  All catheter exchanges were done over the wire.  Left ventricular  angiography was performed in the RAO 30 position.  Central pressures were recorded in the left ventricle and aorta.  LAD pressure flow wire analysis details:  The patient was found to have borderline significant distal left main stenosis as well as ostial LAD disease.  Thus, it was decided to proceed with a pressure flow wire analysis.  The sheath was exchanged to a 6-French sheath.  He was given additional 3000 units of unfractionated heparin to achieve a therapeutic ACT.  An XB 3.0 guiding catheter was used to engage the left main coronary artery.  A Volcano pressure wire was used in the usual fashion to cross the lesion relatively easily.  The patient was started on IV adenosine 140 mcg/kg per minute.  Interrogation revealed an FFR ratio of 0.77, which indicates physiologic significance.  STUDY FINDINGS:  Hemodynamic findings:  Aortic pressure was 129/68 with a mean pressure of 93 mmHg.  Left ventricular pressure was 134/15 with a left ventricular end-diastolic pressure of 20 mmHg.  Coronary angiography:  Left main coronary artery:  The vessel was normal in size and tapers significantly in the distal segment.  Distally, there is 50% stenosis leading to the left circumflex and LAD.  Left anterior descending artery:  The vessel was normal in size and moderately calcified in the  proximal segment.  In the ostial and proximal LAD, there is a 70% stenosis.  In the mid LAD, there is a diffuse 50% disease.  The distal LAD is relatively free of any significant disease.  First diagonal is a large-sized vessel with diffuse 80% ostial and proximal stenosis.  The vessel gives three medium- sized branches.  It is suitable for grafting.  The second diagonal is a small-sized vessel with an 80% ostial stenosis.  Third diagonal is very small.  Left circumflex artery:  The vessel was normal in size and mildly to moderately calcified in the proximal segment.  It has mild diffuse atherosclerosis.   OM1 is a small-sized vessel and free of significant disease.  OM2 is a very large size vessel and branches into two medium- size vessels.  There is a 90% stenosis in the mid segment before it branches.  Third obtuse marginal is normal size vessel and free of significant disease.  Right coronary artery:  The vessel was normal in size.  There was 40% tubular stenosis in the mid segment and otherwise no obstructive disease.  The vessel is mildly calcified in the mid segment.  STUDY CONCLUSIONS: 1. Significant two-vessel coronary artery disease with borderline     significant left main stenosis. 2. 70% ostial/ proximal LAD stenosis, which was interrogated with     a pressure flow wire.  The FFR ratio was 0.77, which indicates     physiologic significance. 3. Normal LV systolic function.  RECOMMENDATIONS:  Due to the presence of borderline significant left main stenosis as well as ostial and proximal LAD disease, I recommend evaluation for coronary artery bypass graft surgery, which will likely give him the best long-term results.  Targets for grafting include LAD, first diagonal, OM2, and possibly OM3.     Lorine Bears, MD    MA/MEDQ  D:  09/26/2010  T:  09/27/2010  Job:  [id number]  Electronically Signed by Lorine Bears MD on 11/01/2010 11:11:23 AM

## 2010-11-06 NOTE — Progress Notes (Signed)
Summary: [facility]: Office Visit  [facility]: Office Visit   Imported By: Earl Many 10/30/2010 18:09:59  _____________________________________________________________________  External Attachment:    Type:   Image     Comment:   External Document

## 2010-11-12 NOTE — Op Note (Signed)
NAMEAVERIE, MEINER             ACCOUNT NO.:  [id number]  MEDICAL RECORD NO.:  [id number]           PATIENT TYPE:  LOCATION:                                 FACILITY:  PHYSICIAN:  Guadalupe Maple, M.D.  DATE OF BIRTH:  December 30, 1931  DATE OF PROCEDURE:  09/27/2010 DATE OF DISCHARGE:                              OPERATIVE REPORT   PROCEDURE:  Intraoperative transesophageal echocardiography.  Mr. Jacque Garrels is a 77 year old white male admitted on September 26, 2010, with chest pain.  He was found to have severe two-vessel coronary artery disease and normal left ventricular function.  He is now scheduled to undergo coronary artery bypass grafting by Dr. Cornelius Moras. Intraoperative transesophageal echocardiography was requested to evaluate the left and right ventricular function and to serve as a monitor for intraoperative volume status and to assess for any valvular pathology.  The patient was brought to the operating room at [facility] and general anesthesia was induced without difficulty.  The trachea was intubated without difficulty.  Following orogastric suctioning, the transesophageal echocardiography probe was then inserted into the esophagus without difficulty.  IMPRESSION:  Prebypass findings: 1. Aortic valve.  The aortic valve was trileaflet.  The leaflets     opened normally.  There was no restriction to opening and there was     no aortic insufficiency.  No calcification of the leaflets. 2. Mitral valve.  The mitral leaflets coapted well without prolapse or     fluttering.  There was trace mitral insufficiency and a normal-     appearing mitral valve. 3. Left ventricle.  There was normal-appearing left ventricular     function with good contractility in all segments interrogated.     Ejection fraction was estimated at 60-65%.  There was normal left     ventricular wall thickness which measured 0.8-0.95 cm at the mid     papillary level in the short axis view at  end-diastole.  Left     ventricular end-diastolic diameter in the short axis view at the     mid papillary level measured 4.74 cm, biventricular end-systolic     diameter measured 3.45 cm.  There was no thrombus noted in the left     ventricular apex. 4. Right ventricle.  There was normal-appearing right ventricular     function with good contractility of the right ventricular free     wall. 5. Tricuspid valve.  There was trace tricuspid insufficiency and     structurally normal-appearing tricuspid valve. 6. Interatrial septum.  Interatrial septum was intact without evidence     of patent foramen ovale or atrial septal defect by color Doppler     and bubble study. 7. Left atrium.  There was normal-appearing left atrial size with no     thrombus noted in the left atrium or left atrial appendage. 8. Ascending aorta.  The ascending aorta showed mild thickening with     no severe atheromatous disease noted and     no aneurysmal dilatation of the aortic root or proximal ascending     aorta. 9. Descending aorta.  There was mild atheromatous disease noted  in the     descending aorta.  The aorta measured 2.5 cm in diameter.          ______________________________ Guadalupe Maple, M.D.     DCJ/MEDQ  D:  09/27/2010  T:  09/28/2010  Job:  [id number]  Electronically Signed by Kipp Brood M.D. on 11/12/2010 01:55:00 PM

## 2010-12-06 NOTE — Letter (Signed)
Summary: [facility] Office Visit Note    TCT Office Visit Note   Imported By: Roderic Ovens 11/28/2010 10:45:04  _____________________________________________________________________  External Attachment:    Type:   Image     Comment:   External Document

## 2011-01-02 ENCOUNTER — Other Ambulatory Visit: Payer: Self-pay | Admitting: *Deleted

## 2011-01-02 ENCOUNTER — Telehealth: Payer: Self-pay | Admitting: Cardiovascular Disease

## 2011-01-02 MED ORDER — METOPROLOL TARTRATE 25 MG PO TABS: 25.0000 mg | ORAL_TABLET | Freq: Two times a day (BID) | ORAL | Status: DC

## 2011-01-02 NOTE — Telephone Encounter (Signed)
Lopressor 25 mg. [facility] in [place] , [place]

## 2011-01-02 NOTE — Telephone Encounter (Signed)
Walk in Pt Form " Needs Refill on Metoprololl 25mg " sent to Message Nurse 01/02/11/KM

## 2011-01-22 ENCOUNTER — Encounter: Payer: Self-pay | Admitting: *Deleted

## 2011-01-22 ENCOUNTER — Encounter: Payer: Self-pay | Admitting: Cardiovascular Disease

## 2011-01-25 NOTE — Op Note (Signed)
[place]  Patient:    Michael Johnston, Michael Johnston                    MRN: [id number] Proc. Date: 01/29/00 Adm. Date:  [id number] Disc. Date: [id number] Attending:  Nelma Rothman Iii CC:         Vernon Prey, M.D., [place], [place].                           Operative Report  PREOPERATIVE DIAGNOSIS:   Organic impotence.  POSTOPERATIVE DIAGNOSIS:  Organic impotence.  PROCEDURE:  Placement of infrapubic Mentor Alpha I penile prosthesis with 18 cm  cylinders, 2 cm rear-tip extenders bilaterally.  SURGEON:  Lucrezia Starch. Ovidio Hanger, M.D.  ANESTHESIA:  General endotracheal anesthesia  ESTIMATED BLOOD LOSS:  150 cc.  TUBES:  #16-French Foley.  COMPLICATIONS:  None.  INDICATIONS:  The patient is a very nice 77 year old white male who presents with organic impotence.  He has proven blood flow problems to that area.  He has attempted multiple therapies, including Viagra, vacuum erection device, injection therapy, etc. with prostaglandin; and none were to satisfaction.  We have had extensive discussions regarding prosthesis.  He has seen the various ones demonstrated, prototypical models.  After understanding the risks, benefits and  alternatives, he has elected to proceed with placement of the Mentor Alpha I prosthesis, 3-piece inflatable.  DESCRIPTION OF PROCEDURE:  The patient was placed in supine position after proper general endotracheal anesthesia, was prepped and draped with a full 10 minute scrub and prep with Betadine, both scrub and solution. A 16-French Foley catheter was  inserted and the bladder was drained.  A transverse infrapubic incision was made approximately 5 cm in length, and sharp dissection was carried down to the subcutaneous tissue.  The retropubic fascia was then dissected out, as was the corpora of the penis bilaterally.  Incision was made in the linea alba and the space of Retzius was entered, and a pouch was created for the  reservoir.  Next, the two corpora were incised. Each were serially dilated with Hegar dilators to 13 [place], and were measured in length. It was noted that they were approximately 0 cm in length, with 9 cm proximally and 11 cm distally.  It was felt an 18 cm Alpha I penile prosthesis was probably appropriate, with 2 cm RTEs bilaterally.  This was felt to be the appropriate size. Again, it was remeasured several times, and all sizes corroborated.  A scrotal pouch was created on the right side (he is right-handed) and this was created in a sub-Dartos fashion.  The penile prosthesis was then placed with the furlough device in seated position distally and proximally. It was inflated and noted to seat well.  There was absolutely no buckling noted, and we were comfortable with their position and functionality. he corporotomies were then closed with running 3-0 PDS suture, and the fascial incision for the reservoir pump was closed in a similar manner. The scrotal pump was placed in a right sub-Dartos pouch, and noted to be in good position. Appropriate connections were made, 65 cc was placed in the reservoir but it only took 40 cc to completely inflate the prosthesis, and no back-pressure was noted to be on the syringe after the reservoir had been inflated.  Irrigation was performed with antibiotic solution. Good hemostasis was noted to be present. Subcutaneous  tissue was approximated with interrupted 3-0 chromic catgut.  Skin was closed with skin staples. The device was inflated and deflated, and noted to be in excellent position.  All wounds were dressed sterilely. Foley catheter was maintained in position. The bladder was drained, urine was clear.  The patient was taken to the recovery room, stable. DD:  01/29/00 TD:  02/03/00 Job: [id number]

## 2011-01-25 NOTE — H&P (Signed)
[place]  Patient:    Michael Johnston, Michael Johnston                    MRN: [id number] Adm. Date:  [id number] Attending:  Nelma Rothman Iii                         History and Physical  CHIEF COMPLAINT:  "I cant have erections."  HISTORY OF PRESENT ILLNESS:  Mr. Rindfleisch is a very nice 77 year old white male who has had progressive organic impotence.  He has undergone treatment with Viagra, injection therapy, vacuum erection device, and other options and has not been satisfied with any of these options.  He has considered all options carefully and has elected to proceed with a 3-piece penile prosthesis.  We have undergone extensive discussion.  His wife is aware of the current situation and is okay with it, and he has looked at various devices and picked the Mentor 3-piece inflatable penile prosthesis.  PAST MEDICAL HISTORY:  ALLERGIES:  No known allergies.  MEDICATIONS:  None.  ILLNESSES:  He recently had a colon resection.  He has had gallbladder surgery also.  He has a history of a hiatal hernia and has cysts of the kidney, but is otherwise without significant problems.  SOCIAL HISTORY:  Negative smoker, negative drinker.  FAMILY HISTORY:  Not pertinent.  REVIEW OF SYSTEMS:  He has no shortness of breath, dyspnea on exertion, chest pain, or GI complaints and he is without significant pain, diaphoresis, diabetes, etc.  PHYSICAL EXAMINATION:  VITAL SIGNS:  Blood pressure 126/72, pulse 68, respirations 18, temperature 97.4 degrees Fahrenheit.  GENERAL:  He is well-nourished, well-developed, in no acute distress. Oriented x 3.  HEENT:  PERRLA.  Ears, nose and throat clear.  NECK:  Without masses or thyromegaly.  CHEST:  Clear anterior and posterior, without rales or rhonchi.  ABDOMEN:  Soft and nontender with no mass or organomegaly.  EXTREMITIES:  Normal.  NEUROLOGIC:  Intact.  SKIN:  Normal.  GENITALIA:  Penis, meatus, scrotum,  testicle, adnexa, anus, and perineum normal.  RECTAL:  Rectal vault is empty.  Prostate is 25 gm, benign.  IMPRESSION:  Organic impotence.  PLAN:  Mentor penile 3-piece inflatable penile prosthesis implantation. DD:  01/29/00 TD:  01/29/00 Job: [id number]

## 2011-01-28 ENCOUNTER — Encounter: Payer: Self-pay | Admitting: Cardiovascular Disease

## 2011-01-28 ENCOUNTER — Ambulatory Visit (INDEPENDENT_AMBULATORY_CARE_PROVIDER_SITE_OTHER): Payer: Medicare Other | Admitting: Cardiovascular Disease

## 2011-01-28 VITALS — BP 130/74 | HR 56 | Resp 12 | Ht 72.0 in | Wt 192.0 lb

## 2011-01-28 DIAGNOSIS — E782 Mixed hyperlipidemia: Secondary | ICD-10-CM

## 2011-01-28 DIAGNOSIS — E785 Hyperlipidemia, unspecified: Secondary | ICD-10-CM

## 2011-01-28 DIAGNOSIS — Z8249 Family history of ischemic heart disease and other diseases of the circulatory system: Secondary | ICD-10-CM | POA: Insufficient documentation

## 2011-01-28 MED ORDER — SIMVASTATIN 40 MG PO TABS: 40.0000 mg | ORAL_TABLET | Freq: Every evening | ORAL | Status: DC

## 2011-01-28 NOTE — Patient Instructions (Signed)
Your physician recommends that you schedule a follow-up appointment in: YEAR WITH DR [facility]   Your physician has recommended you make the following change in your medication: STOP CRESTOR START SIMVASTATIN  40 MG 1 EVERY DAY   Your physician recommends that you return for lab work in: FASTING  LIPID LIVER IN 6 MONTHS DX 272.4 V58.69

## 2011-01-28 NOTE — Assessment & Plan Note (Signed)
Change to generic statin  Labs in 6 months with Dr Kirtland Bouchard or [place]

## 2011-01-28 NOTE — Assessment & Plan Note (Signed)
Stable no angina  Continue asa and beta blocker

## 2011-01-28 NOTE — Progress Notes (Signed)
Michael Johnston is seen post hosptial D/C for SSCP/SEMI after getting injections at the dentist office  September 27, 2010.  He was taken to the operating room wherehe underwent coronary artery bypass grafting x3, the following graftswere placed:    Left internal mammary artery to the distal LAD, coronary artery a     saphenous vein graft to the first diagonal branch, and a saphenous     vein graft to the first obtuse marginal branch.  He had endoscopic     saphenous vein harvest from the right thigh.  He tolerated     procedure well, was taken to the surgical intensive care unit in     stable condition.  Operative findings showed normal left     ventricular function, good-quality conduits, good quality targets.  Post op course was unremarkable.  Has had some bronchitis.  Needs lipid and liver in 3 months.  Denies SSCP, palpitaitons, edema or dyspnea.  Has seen Dr. Cornelius Johnston in F/u with normal post op CXR  Has some arthritic right shoulder pain.  Wants to golf and due some tilling in the yeard which I thought was ok.    Will switch to generic statin as crestor too expensive  ROS: Denies fever, malais, weight loss, blurry vision, decreased visual acuity, cough, sputum, SOB, hemoptysis, pleuritic pain, palpitaitons, heartburn, abdominal pain, melena, lower extremity edema, claudication, or rash.   General: Affect appropriate Healthy:  appears stated age HEENT: normal Neck supple with no adenopathy JVP normal no bruits no thyromegaly Lungs clear with no wheezing and good diaphragmatic motion Heart:  S1/S2 no murmur,rub, gallop or click PMI normal Abdomen: benighn, BS positve, no tenderness, no AAA no bruit.  No HSM or HJR Distal pulses intact with no bruits No edema Neuro non-focal Skin warm and dry No muscular weakness   Current Outpatient Prescriptions  Medication Sig Dispense Refill  . aspirin 325 MG tablet Take 325 mg by mouth daily.        . metoprolol tartrate (LOPRESSOR) 25 MG tablet Take  1 tablet (25 mg total) by mouth 2 (two) times daily.  60 tablet  11  . DISCONTD: rosuvastatin (CRESTOR) 10 MG tablet Take 10 mg by mouth daily.        . simvastatin (ZOCOR) 40 MG tablet Take 1 tablet (40 mg total) by mouth every evening.  30 tablet  11    Allergies  Review of patient's allergies indicates no known allergies.  Electrocardiogram:  Assessment and Plan

## 2011-07-05 ENCOUNTER — Encounter: Payer: Self-pay | Admitting: Internal Medicine

## 2011-07-05 ENCOUNTER — Ambulatory Visit (INDEPENDENT_AMBULATORY_CARE_PROVIDER_SITE_OTHER): Payer: Medicare Other | Admitting: Internal Medicine

## 2011-07-05 VITALS — BP 120/80 | HR 60 | Temp 97.6°F | Resp 16 | Ht 72.0 in | Wt 194.0 lb

## 2011-07-05 DIAGNOSIS — E782 Mixed hyperlipidemia: Secondary | ICD-10-CM

## 2011-07-05 DIAGNOSIS — Z8249 Family history of ischemic heart disease and other diseases of the circulatory system: Secondary | ICD-10-CM

## 2011-07-05 DIAGNOSIS — Z0000 Encounter for general adult medical examination without abnormal findings: Secondary | ICD-10-CM

## 2011-07-05 DIAGNOSIS — Z23 Encounter for immunization: Secondary | ICD-10-CM

## 2011-07-05 DIAGNOSIS — E785 Hyperlipidemia, unspecified: Secondary | ICD-10-CM

## 2011-07-05 DIAGNOSIS — Z86010 Personal history of colonic polyps: Secondary | ICD-10-CM

## 2011-07-05 DIAGNOSIS — I2510 Atherosclerotic heart disease of native coronary artery without angina pectoris: Secondary | ICD-10-CM

## 2011-07-05 LAB — COMPREHENSIVE METABOLIC PANEL
Albumin: 4.2 g/dL (ref 3.5–5.2)
Alkaline Phosphatase: 59 U/L (ref 39–117)
BUN: 21 mg/dL (ref 6–23)
Calcium: 9.2 mg/dL (ref 8.4–10.5)
Chloride: 107 mEq/L (ref 96–112)
Glucose, Bld: 107 mg/dL — ABNORMAL HIGH (ref 70–99)
Potassium: 5.2 mEq/L — ABNORMAL HIGH (ref 3.5–5.1)
Sodium: 142 mEq/L (ref 135–145)
Total Protein: 6.8 g/dL (ref 6.0–8.3)

## 2011-07-05 LAB — LIPID PANEL
Cholesterol: 119 mg/dL (ref 0–200)
VLDL: 9.8 mg/dL (ref 0.0–40.0)

## 2011-07-05 LAB — CBC WITH DIFFERENTIAL/PLATELET
Eosinophils Absolute: 0.2 10*3/uL (ref 0.0–0.7)
Eosinophils Relative: 3.1 % (ref 0.0–5.0)
Lymphocytes Relative: 35 % (ref 12.0–46.0)
MCHC: 33.6 g/dL (ref 30.0–36.0)
MCV: 98.4 fl (ref 78.0–100.0)
Monocytes Absolute: 0.5 10*3/uL (ref 0.1–1.0)
Neutrophils Relative %: 52.3 % (ref 43.0–77.0)
Platelets: 173 10*3/uL (ref 150.0–400.0)
WBC: 5 10*3/uL (ref 4.5–10.5)

## 2011-07-05 LAB — TSH: TSH: 0.78 u[IU]/mL (ref 0.35–5.50)

## 2011-07-05 MED ORDER — SIMVASTATIN 40 MG PO TABS: 40.0000 mg | ORAL_TABLET | Freq: Every evening | ORAL | Status: DC

## 2011-07-05 MED ORDER — METOPROLOL TARTRATE 25 MG PO TABS: 25.0000 mg | ORAL_TABLET | Freq: Two times a day (BID) | ORAL | Status: DC

## 2011-07-05 NOTE — Patient Instructions (Signed)
Limit your sodium (Salt) intake    It is important that you exercise regularly, at least 20 minutes 3 to 4 times per week.  If you develop chest pain or shortness of breath seek  medical attention.  Return in one year for follow-up   

## 2011-07-05 NOTE — Progress Notes (Signed)
Subjective:    Patient ID: Michael Johnston, male    DOB: 02-13-1932, 77 y.o.   MRN: [id number]  HPI  77 year old patient who is seen today for a wellness exam. He is followed closely by cardiology and is status post CABG in January of this year. No concerns or complaints.  He is followed by urology semiannually as well as cardiology and GI Here for Medicare AWV:   1. Risk factors based on Past M, S, F history:  cardiovascular risk factors include dyslipidemia and remote tobacco use. He has known coronary artery disease status post CABG 2. Physical Activities: remains quite active, plays occasional golf  3. Depression/mood: no history of depression or mood disorder  4. Hearing: no deficits  5. ADL's: independent in all aspects of daily living  6. Fall Risk: low  7. Home Safety: no problems identified  8. Height, weight, &visual acuity:height and weight are stable. No change in visual acuity  9. Counseling: heart healthy diet regular. Exercise encouraged  10. Labs ordered based on risk factors: laboratory profile will be reviewed  11. Referral Coordination- urology follow-up later this month as scheduled  12. Care Plan- heart healthy diet, exercise regimen encouraged  13. Cognitive Assessment- alert and oriented, with normal affect. No history of cognitive impairment able to handle all executive functions without difficulty  Preventive Screening-Counseling & Management   Alcohol-Tobacco  Smoking Status: remote   Allergies (verified):  No Known Drug Allergies   Past History:  Past Medical History:  Colonic polyps, hx of  Past Surgical History:  status post partial colectomy and cholecystectomy, 1998 Derrell Lolling)  colonoscopy. 2009 Jarold Motto)  history penile implant Earlene Plater)   Family History:  Reviewed history and no changes required.  father died age 21  mother died age 39  one sister died in her 59s due to lung cancer   Social History:  Reviewed history and no changes required.   Married  Never Smoked ( no tobacco since teenage years)  6 children 15 grandchildren and 3 great-grandchildren  Smoking Status: never       Review of Systems  Constitutional: Negative for fever, chills, activity change, appetite change and fatigue.  HENT: Negative for hearing loss, ear pain, congestion, rhinorrhea, sneezing, mouth sores, trouble swallowing, neck pain, neck stiffness, dental problem, voice change, sinus pressure and tinnitus.   Eyes: Negative for photophobia, pain, redness and visual disturbance.  Respiratory: Negative for apnea, cough, choking, chest tightness, shortness of breath and wheezing.   Cardiovascular: Negative for chest pain, palpitations and leg swelling.  Gastrointestinal: Negative for nausea, vomiting, abdominal pain, diarrhea, constipation, blood in stool, abdominal distention, anal bleeding and rectal pain.  Genitourinary: Negative for dysuria, urgency, frequency, hematuria, flank pain, decreased urine volume, discharge, penile swelling, scrotal swelling, difficulty urinating, genital sores and testicular pain.  Musculoskeletal: Negative for myalgias, back pain, joint swelling, arthralgias and gait problem.  Skin: Negative for color change, rash and wound.  Neurological: Negative for dizziness, tremors, seizures, syncope, facial asymmetry, speech difficulty, weakness, light-headedness, numbness and headaches.  Hematological: Negative for adenopathy. Does not bruise/bleed easily.  Psychiatric/Behavioral: Negative for suicidal ideas, hallucinations, behavioral problems, confusion, sleep disturbance, self-injury, dysphoric mood, decreased concentration and agitation. The patient is not nervous/anxious.        Objective:   Physical Exam  Constitutional: He appears well-developed and well-nourished.  HENT:  Head: Normocephalic and atraumatic.  Right Ear: External ear normal.  Left Ear: External ear normal.  Nose: Nose normal.  Mouth/Throat: Oropharynx is  clear and moist.  Eyes: Conjunctivae and EOM are normal. Pupils are equal, round, and reactive to light. No scleral icterus.  Neck: Normal range of motion. Neck supple. No JVD present. No thyromegaly present.  Cardiovascular: Regular rhythm, normal heart sounds and intact distal pulses.  Exam reveals no gallop and no friction rub.   No murmur heard. Pulmonary/Chest: Effort normal and breath sounds normal. He exhibits no tenderness.  Abdominal: Soft. Bowel sounds are normal. He exhibits no distension and no mass. There is no tenderness.       Liver is palpable on inspiration  Genitourinary:       Status post penile implant  Musculoskeletal: Normal range of motion. He exhibits no edema and no tenderness.  Lymphadenopathy:    He has no cervical adenopathy.  Neurological: He is alert. He has normal reflexes. No cranial nerve deficit. Coordination normal.       Diminished sensation distal to the knees  Skin: Skin is warm and dry. No rash noted.       Status post recent cryotherapy helix left ear Status post biopsy left nares. Scheduled for Mohs surgery  Psychiatric: He has a normal mood and affect. His behavior is normal.          Assessment & Plan:   Preventive health care. Laboratory update including lipid panel will be reviewed Coronary artery disease status post CABG. Followup cardiology Dyslipidemia. Lipid profile reviewed  Recheck one year

## 2011-07-08 ENCOUNTER — Telehealth: Payer: Self-pay | Admitting: *Deleted

## 2011-07-08 NOTE — Telephone Encounter (Signed)
Spoke with pt wife, dr Eden Emms had received a call from pts dentist and he may need some dental work done. The last time the pt was with the dentist he had a MI. Per dr Eden Emms pt needs to take imdur 30 mg daily starting two days prior and day of dental appt. According to pts wife he is not needing anything at this time, they will call if needed in the future Michael Johnston

## 2011-07-15 ENCOUNTER — Telehealth: Payer: Self-pay | Admitting: Internal Medicine

## 2011-07-15 NOTE — Telephone Encounter (Signed)
Pt requesting results of lab work.

## 2011-07-15 NOTE — Telephone Encounter (Signed)
Attempt to call - ans mach at hm# - LMTCB if questions - labs WNL

## 2011-08-08 ENCOUNTER — Ambulatory Visit (INDEPENDENT_AMBULATORY_CARE_PROVIDER_SITE_OTHER): Payer: Medicare Other | Admitting: Internal Medicine

## 2011-08-08 ENCOUNTER — Encounter: Payer: Self-pay | Admitting: Internal Medicine

## 2011-08-08 VITALS — BP 110/70 | Temp 97.9°F | Wt 196.0 lb

## 2011-08-08 DIAGNOSIS — M25559 Pain in unspecified hip: Secondary | ICD-10-CM

## 2011-08-08 DIAGNOSIS — M25552 Pain in left hip: Secondary | ICD-10-CM

## 2011-08-08 NOTE — Patient Instructions (Signed)
Take 400-600 mg of ibuprofen ( Advil, Motrin) with food every 4 to 6 hours as needed for pain relief or control of fever  You  may move around, but avoid painful motions and activities.  Apply ice to the sore area for 15 to 20 minutes 3 or 4 times daily for the next two to 3 days.  Call or return to clinic prn if these symptoms worsen or fail to improve as anticipated.  

## 2011-08-08 NOTE — Progress Notes (Signed)
  Subjective:    Patient ID: Michael Johnston, male    DOB: 25-Nov-1931, 77 y.o.   MRN: [id number]  HPI  77 year old patient who has a history of dyslipidemia and coronary artery disease. He presents with a two-week history of intermittent left hip pain. Symptoms intensified yesterday morning but he still walks 1 mile. Today he had a 2 mile walk. He has had no falls or trauma. He has been taking no medications no history of prior pain    Review of Systems  Musculoskeletal: Gait problem: left hip pain.       Objective:   Physical Exam  Constitutional: He appears well-developed and well-nourished. No distress.  Musculoskeletal: Normal range of motion. He exhibits no edema and no tenderness.       No tenderness over the left hip area Range of motion of the left hip appear to be intact. Straight leg testing negative          Assessment & Plan:   Left hip pain. We'll attempt to moderate his activities. We'll try to follow and he'll short term. If he does not improve we'll consider radiologic evaluation

## 2011-08-19 DIAGNOSIS — N529 Male erectile dysfunction, unspecified: Secondary | ICD-10-CM | POA: Insufficient documentation

## 2011-10-16 ENCOUNTER — Telehealth: Payer: Self-pay | Admitting: Internal Medicine

## 2011-10-16 NOTE — Telephone Encounter (Signed)
Pt states he had this problem about 4 to 6 years ago and would like to have another MRI.  Pt states he would like to have an MRI to see if disc have bulged more. Pt would like to hold off on office visit until Dr. Amador Cunas advises.  Pt offered ov.

## 2011-10-16 NOTE — Telephone Encounter (Signed)
Patient called and is having some back pain.  Patient would like a referral for an MRI.  Please advise.

## 2011-10-16 NOTE — Telephone Encounter (Signed)
Left a message for pt to return call 

## 2011-10-16 NOTE — Telephone Encounter (Signed)
Please schedule ROV 

## 2011-11-11 ENCOUNTER — Telehealth: Payer: Self-pay | Admitting: Cardiovascular Disease

## 2011-11-11 NOTE — Telephone Encounter (Signed)
Pt's wife calling re pt has dentist appt before tooth extraction does he need med? uses [facility] [place]

## 2011-11-11 NOTE — Telephone Encounter (Signed)
SPOKE WITH WIFE AFTER REVIEWING PT'S RECORD  HAS ONLY HAD CABG NO VALVE REPAIR OR REPLACEMENTS DOES NOT REQUIRE  ANTIBIOTIC THERAPY AT THIS TIME  PRIOR TO  TOOTH EXTRACTION./CY

## 2011-12-19 ENCOUNTER — Other Ambulatory Visit: Payer: Self-pay

## 2011-12-19 DIAGNOSIS — E785 Hyperlipidemia, unspecified: Secondary | ICD-10-CM

## 2011-12-19 MED ORDER — METOPROLOL TARTRATE 25 MG PO TABS: 25.0000 mg | ORAL_TABLET | Freq: Two times a day (BID) | ORAL | Status: DC

## 2011-12-19 MED ORDER — SIMVASTATIN 40 MG PO TABS: 40.0000 mg | ORAL_TABLET | Freq: Every evening | ORAL | Status: DC

## 2011-12-24 ENCOUNTER — Telehealth: Payer: Self-pay | Admitting: Cardiovascular Disease

## 2011-12-24 NOTE — Telephone Encounter (Signed)
[facility] [place] [facility] for refill of Clobetasol solution.

## 2011-12-25 ENCOUNTER — Other Ambulatory Visit: Payer: Self-pay | Admitting: Internal Medicine

## 2011-12-25 NOTE — Telephone Encounter (Signed)
PHARMACISTS NOTIFIED TO CALL [facility]D FOR  CLOBETASOL REFILL./CY

## 2012-01-17 ENCOUNTER — Encounter: Payer: Self-pay | Admitting: *Deleted

## 2012-01-17 ENCOUNTER — Ambulatory Visit: Payer: Medicare Other | Admitting: Cardiovascular Disease

## 2012-01-20 ENCOUNTER — Encounter: Payer: Self-pay | Admitting: Cardiovascular Disease

## 2012-01-20 ENCOUNTER — Ambulatory Visit (INDEPENDENT_AMBULATORY_CARE_PROVIDER_SITE_OTHER): Payer: Medicare Other | Admitting: Cardiovascular Disease

## 2012-01-20 VITALS — BP 129/65 | HR 55 | Wt 195.0 lb

## 2012-01-20 DIAGNOSIS — I2510 Atherosclerotic heart disease of native coronary artery without angina pectoris: Secondary | ICD-10-CM

## 2012-01-20 DIAGNOSIS — Z8719 Personal history of other diseases of the digestive system: Secondary | ICD-10-CM

## 2012-01-20 DIAGNOSIS — Z8249 Family history of ischemic heart disease and other diseases of the circulatory system: Secondary | ICD-10-CM

## 2012-01-20 DIAGNOSIS — E782 Mixed hyperlipidemia: Secondary | ICD-10-CM

## 2012-01-20 NOTE — Assessment & Plan Note (Signed)
Cholesterol is at goal.  Continue current dose of statin and diet Rx.  No myalgias or side effects.  F/U  LFT's in 6 months. Lab Results  Component Value Date   LDLCALC 51 07/05/2011   Will have F/U chol and liver in 10/13

## 2012-01-20 NOTE — Patient Instructions (Signed)
Your physician wants you to follow-up in: YEAR WITH DR NISHAN  You will receive a reminder letter in the mail two months in advance. If you don't receive a letter, please call our office to schedule the follow-up appointment.  Your physician recommends that you continue on your current medications as directed. Please refer to the Current Medication list given to you today. 

## 2012-01-20 NOTE — Assessment & Plan Note (Signed)
Stable.  Continue as needed H2 blockers. Avoid carbs and late night meals with recumbancy

## 2012-01-20 NOTE — Assessment & Plan Note (Signed)
No angina Has nitro.  CABG 1/12  RCA not bypassed  ASA

## 2012-01-20 NOTE — Progress Notes (Signed)
Patient ID: Michael Johnston, male   DOB: 08/10/1932, 78 y.o.   MRN: [id number] Michael Johnston is seen for F/U CAD  SEMI after getting injections at the dentist office September 27, 2010. Subsequently found to have 3VD needing CABG: Note the RCA was not grafted  1/12 Left internal mammary artery to the distal LAD, coronary artery a  saphenous vein graft to the first diagonal branch, and a saphenous  vein graft to the first obtuse marginal branch. He had endoscopic  saphenous vein harvest from the right thigh. He tolerated  procedure well, was taken to the surgical intensive care unit in  stable condition. Operative findings showed normal left  ventricular function, good-quality conduits, good quality targets.  ROS: Denies fever, malais, weight loss, blurry vision, decreased visual acuity, cough, sputum, SOB, hemoptysis, pleuritic pain, palpitaitons, heartburn, abdominal pain, melena, lower extremity edema, claudication, or rash.  All other systems reviewed and negative  Changed to generic statin last visit.  General: Affect appropriate Healthy:  appears stated age HEENT: normal Neck supple with no adenopathy JVP normal no bruits no thyromegaly Lungs clear with no wheezing and good diaphragmatic motion Heart:  S1/S2 no murmur, no rub, gallop or click PMI normal Abdomen: benighn, BS positve, no tenderness, no AAA no bruit.  No HSM or HJR Distal pulses intact with no bruits No edema Neuro non-focal Skin warm and dry No muscular weakness   Current Outpatient Prescriptions  Medication Sig Dispense Refill  . aspirin 325 MG tablet Take 325 mg by mouth daily.        . clobetasol (TEMOVATE) 0.05 % external solution APPLY TO SCALP EVERY DAY  50 mL  3  . ketoconazole (NIZORAL) 2 % shampoo As directed      . metoprolol tartrate (LOPRESSOR) 25 MG tablet Take 1 tablet (25 mg total) by mouth 2 (two) times daily.  60 tablet  5  . simvastatin (ZOCOR) 40 MG tablet Take 1 tablet (40 mg total) by mouth every  evening.  30 tablet  5    Allergies  Review of patient's allergies indicates no known allergies.  Electrocardiogram:  SR rate 56  Normal ECG  Assessment and Plan

## 2012-05-14 ENCOUNTER — Inpatient Hospital Stay (HOSPITAL_COMMUNITY)
Admission: EM | Admit: 2012-05-14 | Discharge: 2012-05-16 | DRG: 641 | Disposition: A | Discharge status: Home / Self Care | Payer: Medicare Other | Attending: Internal Medicine | Admitting: Internal Medicine

## 2012-05-14 ENCOUNTER — Emergency Department (HOSPITAL_COMMUNITY): Payer: Medicare Other

## 2012-05-14 ENCOUNTER — Encounter (HOSPITAL_COMMUNITY): Payer: Self-pay | Admitting: *Deleted

## 2012-05-14 DIAGNOSIS — Z860100 Personal history of colon polyps, unspecified: Secondary | ICD-10-CM

## 2012-05-14 DIAGNOSIS — I9589 Other hypotension: Secondary | ICD-10-CM | POA: Diagnosis present

## 2012-05-14 DIAGNOSIS — R079 Chest pain, unspecified: Secondary | ICD-10-CM

## 2012-05-14 DIAGNOSIS — Z79899 Other long term (current) drug therapy: Secondary | ICD-10-CM

## 2012-05-14 DIAGNOSIS — Z951 Presence of aortocoronary bypass graft: Secondary | ICD-10-CM

## 2012-05-14 DIAGNOSIS — E785 Hyperlipidemia, unspecified: Secondary | ICD-10-CM

## 2012-05-14 DIAGNOSIS — Z8249 Family history of ischemic heart disease and other diseases of the circulatory system: Secondary | ICD-10-CM

## 2012-05-14 DIAGNOSIS — Z8719 Personal history of other diseases of the digestive system: Secondary | ICD-10-CM

## 2012-05-14 DIAGNOSIS — E860 Dehydration: Principal | ICD-10-CM | POA: Diagnosis present

## 2012-05-14 DIAGNOSIS — Y92009 Unspecified place in unspecified non-institutional (private) residence as the place of occurrence of the external cause: Secondary | ICD-10-CM

## 2012-05-14 DIAGNOSIS — I2510 Atherosclerotic heart disease of native coronary artery without angina pectoris: Secondary | ICD-10-CM | POA: Diagnosis present

## 2012-05-14 DIAGNOSIS — I6529 Occlusion and stenosis of unspecified carotid artery: Secondary | ICD-10-CM | POA: Diagnosis present

## 2012-05-14 DIAGNOSIS — R55 Syncope and collapse: Secondary | ICD-10-CM

## 2012-05-14 DIAGNOSIS — Z9049 Acquired absence of other specified parts of digestive tract: Secondary | ICD-10-CM

## 2012-05-14 DIAGNOSIS — Z7982 Long term (current) use of aspirin: Secondary | ICD-10-CM

## 2012-05-14 DIAGNOSIS — I658 Occlusion and stenosis of other precerebral arteries: Secondary | ICD-10-CM | POA: Diagnosis present

## 2012-05-14 DIAGNOSIS — X30XXXA Exposure to excessive natural heat, initial encounter: Secondary | ICD-10-CM | POA: Diagnosis present

## 2012-05-14 DIAGNOSIS — Z87891 Personal history of nicotine dependence: Secondary | ICD-10-CM

## 2012-05-14 DIAGNOSIS — E782 Mixed hyperlipidemia: Secondary | ICD-10-CM | POA: Diagnosis present

## 2012-05-14 DIAGNOSIS — Z9089 Acquired absence of other organs: Secondary | ICD-10-CM

## 2012-05-14 DIAGNOSIS — I959 Hypotension, unspecified: Secondary | ICD-10-CM

## 2012-05-14 LAB — URINALYSIS, ROUTINE W REFLEX MICROSCOPIC
Hgb urine dipstick: NEGATIVE
Specific Gravity, Urine: 1.015 (ref 1.005–1.030)
Urobilinogen, UA: 1 mg/dL (ref 0.0–1.0)
pH: 7 (ref 5.0–8.0)

## 2012-05-14 LAB — COMPREHENSIVE METABOLIC PANEL
ALT: 15 U/L (ref 0–53)
AST: 19 U/L (ref 0–37)
Albumin: 3.9 g/dL (ref 3.5–5.2)
Alkaline Phosphatase: 67 U/L (ref 39–117)
Chloride: 105 mEq/L (ref 96–112)
Creatinine, Ser: 1.01 mg/dL (ref 0.50–1.35)
Potassium: 4.1 mEq/L (ref 3.5–5.1)
Sodium: 142 mEq/L (ref 135–145)
Total Bilirubin: 0.3 mg/dL (ref 0.3–1.2)

## 2012-05-14 LAB — POCT I-STAT TROPONIN I: Troponin i, poc: 0 ng/mL (ref 0.00–0.08)

## 2012-05-14 LAB — CBC WITH DIFFERENTIAL/PLATELET
Basophils Absolute: 0 10*3/uL (ref 0.0–0.1)
Lymphocytes Relative: 18 % (ref 12–46)
Lymphs Abs: 1.3 10*3/uL (ref 0.7–4.0)
Neutro Abs: 5.7 10*3/uL (ref 1.7–7.7)
Neutrophils Relative %: 78 % — ABNORMAL HIGH (ref 43–77)
Platelets: 131 10*3/uL — ABNORMAL LOW (ref 150–400)
RBC: 4.14 MIL/uL — ABNORMAL LOW (ref 4.22–5.81)
RDW: 13.3 % (ref 11.5–15.5)
WBC: 7.3 10*3/uL (ref 4.0–10.5)

## 2012-05-14 LAB — PROTIME-INR: Prothrombin Time: 13.9 seconds (ref 11.6–15.2)

## 2012-05-14 LAB — PRO B NATRIURETIC PEPTIDE: Pro B Natriuretic peptide (BNP): 205.3 pg/mL (ref 0–450)

## 2012-05-14 MED ORDER — ASPIRIN 325 MG PO TABS
325.0000 mg | ORAL_TABLET | Freq: Every day | ORAL | Status: DC
  Administered 2012-05-14 – 2012-05-16 (×3): 325 mg via ORAL
  Filled 2012-05-14 (×3): qty 1

## 2012-05-14 MED ORDER — SODIUM CHLORIDE 0.9 % IV BOLUS (SEPSIS)
1000.0000 mL | INTRAVENOUS | Status: AC
  Administered 2012-05-14: 1000 mL via INTRAVENOUS

## 2012-05-14 MED ORDER — SIMVASTATIN 40 MG PO TABS
40.0000 mg | ORAL_TABLET | Freq: Every evening | ORAL | Status: DC
  Administered 2012-05-14 – 2012-05-15 (×2): 40 mg via ORAL
  Filled 2012-05-14 (×4): qty 1

## 2012-05-14 MED ORDER — ALUM & MAG HYDROXIDE-SIMETH 200-200-20 MG/5ML PO SUSP
30.0000 mL | Freq: Four times a day (QID) | ORAL | Status: DC | PRN
  Administered 2012-05-15: 30 mL via ORAL
  Filled 2012-05-14: qty 30

## 2012-05-14 MED ORDER — ONDANSETRON HCL 4 MG PO TABS: 4.0000 mg | ORAL_TABLET | Freq: Four times a day (QID) | ORAL | Status: DC | PRN

## 2012-05-14 MED ORDER — ONDANSETRON HCL 4 MG/2ML IJ SOLN
INTRAMUSCULAR | Status: AC
  Filled 2012-05-14: qty 2

## 2012-05-14 MED ORDER — SODIUM CHLORIDE 0.9 % IV SOLN
INTRAVENOUS | Status: AC
  Administered 2012-05-14: 22:00:00 via INTRAVENOUS

## 2012-05-14 MED ORDER — ONDANSETRON HCL 4 MG/2ML IJ SOLN: 4.0000 mg | Freq: Once | INTRAMUSCULAR | Status: DC

## 2012-05-14 MED ORDER — SODIUM CHLORIDE 0.9 % IJ SOLN
3.0000 mL | Freq: Two times a day (BID) | INTRAMUSCULAR | Status: DC
  Administered 2012-05-14: 3 mL via INTRAVENOUS

## 2012-05-14 MED ORDER — HEPARIN SODIUM (PORCINE) 5000 UNIT/ML IJ SOLN
5000.0000 [IU] | Freq: Three times a day (TID) | INTRAMUSCULAR | Status: DC
  Administered 2012-05-14 – 2012-05-16 (×5): 5000 [IU] via SUBCUTANEOUS
  Filled 2012-05-14 (×8): qty 1

## 2012-05-14 MED ORDER — NALOXONE HCL 0.4 MG/ML IJ SOLN: 0.4000 mg | Freq: Once | INTRAMUSCULAR | Status: DC

## 2012-05-14 MED ORDER — ONDANSETRON HCL 4 MG/2ML IJ SOLN: 4.0000 mg | Freq: Four times a day (QID) | INTRAMUSCULAR | Status: DC | PRN

## 2012-05-14 MED ORDER — SODIUM CHLORIDE 0.9 % IV SOLN
INTRAVENOUS | Status: DC
  Administered 2012-05-15: 16:00:00 via INTRAVENOUS

## 2012-05-14 MED ORDER — METOPROLOL TARTRATE 25 MG PO TABS
25.0000 mg | ORAL_TABLET | Freq: Two times a day (BID) | ORAL | Status: DC
  Administered 2012-05-14 – 2012-05-16 (×4): 25 mg via ORAL
  Filled 2012-05-14 (×5): qty 1

## 2012-05-14 NOTE — ED Notes (Signed)
EDP/Resident at bedside. 

## 2012-05-14 NOTE — ED Provider Notes (Signed)
History     CSN: [id number]  Arrival date & time 05/14/12  1640   First MD Initiated Contact with Patient 05/14/12 1649      Chief Complaint  Patient presents with  . Loss of Consciousness    (Consider location/radiation/quality/duration/timing/severity/associated sxs/prior treatment) Patient is a 78 y.o. male presenting with syncope. The history is provided by the patient.  Loss of Consciousness This is a new problem. The current episode started today. Episode frequency: twice. The problem has been resolved. Associated symptoms include nausea. Pertinent negatives include no abdominal pain, chest pain, coughing, fever, headaches, neck pain, numbness or vomiting. The symptoms are aggravated by exertion. He has tried nothing for the symptoms. The treatment provided no relief.    Past Medical History  Diagnosis Date  . COLONIC POLYPS, HX OF   . Mixed hyperlipidemia   . CHEST PAIN   . HIATAL HERNIA, HX OF     Past Surgical History  Procedure Date  . Coronary artery bypass graft     Median sternotomy for coronary artery bypass grafting x3  (left internal mammary artery to distal left anterior  descending  coronary artery, saphenous vein graft to first diagonal branch,  saphenous vein graft to first  obtuse marginal branch, endoscopic  saphenous vein harvest from right thigh). SURGEON:  Salvatore Decent.  Cornelius Moras, MD  ASSISTANT:  Rowe Clack, PA-C  ANESTHESIA:  Guadalupe Maple, MD   . Status post partial colectomy and cholecystectomy, 1998 (ingram)     Family History  Problem Relation Age of Onset  . Lung cancer Sister     History  Substance Use Topics  . Smoking status: Former Games developer  . Smokeless tobacco: Never Used  . Alcohol Use: No      Review of Systems  Constitutional: Negative for fever.  HENT: Negative for rhinorrhea, drooling and neck pain.   Eyes: Negative for pain.  Respiratory: Negative for cough and shortness of breath.   Cardiovascular: Positive for syncope.  Negative for chest pain and leg swelling.  Gastrointestinal: Positive for nausea. Negative for vomiting, abdominal pain and diarrhea.  Genitourinary: Negative for dysuria and hematuria.  Musculoskeletal: Negative for gait problem.  Skin: Negative for color change.  Neurological: Negative for numbness and headaches.  Hematological: Negative for adenopathy.  Psychiatric/Behavioral: Negative for behavioral problems.  All other systems reviewed and are negative.    Allergies  Review of patient's allergies indicates no known allergies.  Home Medications   No current outpatient prescriptions on file.  BP 142/62  Pulse 73  Temp 97.7 F (36.5 C) (Oral)  Resp 18  Ht 6' (1.829 m)  Wt 188 lb 11.4 oz (85.6 kg)  BMI 25.59 kg/m2  SpO2 99%  Physical Exam  Nursing note and vitals reviewed. Constitutional: He is oriented to person, place, and time. He appears well-developed and well-nourished.  HENT:  Head: Normocephalic and atraumatic.  Right Ear: External ear normal.  Left Ear: External ear normal.  Nose: Nose normal.  Mouth/Throat: Oropharynx is clear and moist. No oropharyngeal exudate.  Eyes: Conjunctivae and EOM are normal. Pupils are equal, round, and reactive to light.  Neck: Normal range of motion. Neck supple.  Cardiovascular: Normal rate, regular rhythm, normal heart sounds and intact distal pulses.  Exam reveals no gallop and no friction rub.   No murmur heard. Pulmonary/Chest: Effort normal and breath sounds normal. No respiratory distress. He has no wheezes.  Abdominal: Soft. Bowel sounds are normal. He exhibits no distension. There is no  tenderness. There is no rebound and no guarding.  Musculoskeletal: Normal range of motion. He exhibits no edema and no tenderness.  Neurological: He is alert and oriented to person, place, and time. He has normal strength. No cranial nerve deficit or sensory deficit. Coordination normal.  Skin: Skin is warm and dry.  Psychiatric: He has a  normal mood and affect. His behavior is normal.    ED Course  Procedures (including critical care time)  Labs Reviewed  CBC WITH DIFFERENTIAL - Abnormal; Notable for the following:    RBC 4.14 (*)     Platelets 131 (*)     Neutrophils Relative 78 (*)     All other components within normal limits  COMPREHENSIVE METABOLIC PANEL - Abnormal; Notable for the following:    Glucose, Bld 115 (*)     BUN 25 (*)     GFR calc non Af Amer 70 (*)     GFR calc Af Amer 81 (*)     All other components within normal limits  PRO B NATRIURETIC PEPTIDE  LACTIC ACID, PLASMA  PROTIME-INR  URINALYSIS, ROUTINE W REFLEX MICROSCOPIC  POCT I-STAT TROPONIN I  CBC  BASIC METABOLIC PANEL  TROPONIN I  TROPONIN I  MAGNESIUM  PHOSPHORUS   Ct Head Wo Contrast  05/14/2012  *RADIOLOGY REPORT*  Clinical Data: Loss of consciousness.  Unresponsive.  CT HEAD WITHOUT CONTRAST  Technique:  Contiguous axial images were obtained from the base of the skull through the vertex without contrast.  Comparison: None.  Findings: No acute cortical infarct, hemorrhage, mass lesion is present.  Mild generalized atrophy is likely within normal limits for age.  The ventricles are of normal size.  No significant extra- axial fluid collection is present.  Atherosclerotic calcifications are present within the cavernous carotid arteries and at the dural margin of the vertebral arteries bilaterally.  The paranasal sinuses and mastoid air cells are clear.  The osseous skull is intact.  IMPRESSION:  1.  Normal CT appearance the brain. 2.  Atherosclerosis.   Original Report Authenticated By: Jamesetta Orleans. MATTERN, M.D.    Dg Chest Port 1 View  05/14/2012  *RADIOLOGY REPORT*  Clinical Data: Syncope  PORTABLE CHEST - 1 VIEW  Comparison: 10/19/2010  Findings: Increased interstitial markings without frank interstitial edema.  No focal consolidation. No pleural effusion or pneumothorax.  Cardiomediastinal silhouette is within normal limits.  Postsurgical changes related to prior CABG.  IMPRESSION: Increased interstitial markings without frank interstitial edema.   Original Report Authenticated By: Charline Bills, M.D.      1. Syncope   2. Other and unspecified hyperlipidemia   3. CAD (coronary artery disease)   4. Mixed hyperlipidemia      Date: 05/14/2012  Rate: 70  Rhythm: normal sinus rhythm  QRS Axis: left  Intervals: QT prolonged  ST/T Wave abnormalities: Non-specific ST changes  Conduction Disutrbances:none  Narrative Interpretation: Prominent ST elev in V2-V5 now much less prominent   Old EKG Reviewed: changes noted    MDM  12:41 AM 78 y.o. male w hx of CABG on ASA pw syncope. Per EMS report pt was outside mowing today and upon return inside he had witnessed syncope while resting on the couch. His wife states he was unarousable. EMS notes the pt was talkitive en route and again syncopized. Pt AFVSS here, mentating well on exam. Ddx includes syncope, seizure, heat exhaustion, dehydration, orthostatic hypotension. Will get labs, IVF, CT head.   Will admit to hospitalist to workup syncope.  Clinical Impression 1. Syncope   2. Other and unspecified hyperlipidemia   3. CAD (coronary artery disease)   4. Mixed hyperlipidemia        Purvis Sheffield, MD 05/15/12 0041

## 2012-05-14 NOTE — H&P (Signed)
[facility]                                                                                    Patient Demographics  Michael Johnston, is a 78 y.o. male  CSN: [id number]  MRN: [id number]  DOB - [birth year]  Admit Date - 05/14/2012  Outpatient Primary MD for the patient is [name], MD   With History of -  Past Medical History  Diagnosis Date  . COLONIC POLYPS, HX OF   . Mixed hyperlipidemia   . CHEST PAIN   . HIATAL HERNIA, HX OF       Past Surgical History  Procedure Date  . Coronary artery bypass graft     Median sternotomy for coronary artery bypass grafting x3  (left internal mammary artery to distal left anterior  descending  coronary artery, saphenous vein graft to first diagonal branch,  saphenous vein graft to first  obtuse marginal branch, endoscopic  saphenous vein harvest from right thigh). SURGEON:  Salvatore Decent.  Cornelius Moras, MD  ASSISTANT:  Rowe Clack, PA-C  ANESTHESIA:  Guadalupe Maple, MD   . Status post partial colectomy and cholecystectomy, 1998 (ingram)     in for   Chief Complaint  Patient presents with  . Loss of Consciousness     HPI  Michael Johnston  is a 78 y.o. male, with significant past medical history of coronary artery status post CABG, presents 4 episodes of loss of consciousness/syncope, patient was accompanied by his family at bedside, he reports he has been working at his yard this morning for a few hours, as well reports he has been mowing the yard with right long lower for couple hours, and reports when he entered home sat on the chair he felt lightheaded and dizzy and then he does not remember what happened, family were at the bedside and they reported they thought he wasn't sleeping as patient started to snore, when they tried to wake him up he wouldn't arouse which prompted them to call EMS, patient was given fluid by paramedics where he started to become more responsive, and it's unclear if patient was hypotensive  or not in ambulance, on presentation to ED patient's symptoms totally resolved where he was back at baseline, patient denies any focal neurological deficits, any weakness, tingling or numbness, as well as there was no tongue biting urinary or stool incontinence, or seizure-like activity, patient was giving total of 1 L of normal saline in ED which he reports he is feeling much better now, patient denies any chest pain or chest pressure or palpitation or shortness of breath, CT brain did not show any acute finding, and as well EKG did not show any acute changes.    Review of Systems    In addition to the HPI above,  No Fever-chills, No Headache, No changes with Vision or hearing, No problems swallowing food or Liquids, No Chest pain, Cough or Shortness of Breath, No Abdominal pain, No Nausea or Vommitting, Bowel movements are regular, No Blood in stool or Urine, No dysuria, No new skin rashes or bruises, No new joints pains-aches,  No new weakness, tingling, numbness in  any extremity, patient had an episode of loss of consciousness. No recent weight gain or loss, No polyuria, polydypsia or polyphagia, No significant Mental Stressors.  A full 10 point Review of Systems was done, except as stated above, all other Review of Systems were negative.   Social History History  Substance Use Topics  . Smoking status: Former Games developer  . Smokeless tobacco: Never Used  . Alcohol Use: No   \ Family History Family History  Problem Relation Age of Onset  . Lung cancer Sister    Prior to Admission medications   Medication Sig Start Date End Date Taking? Authorizing Provider  aspirin 325 MG tablet Take 325 mg by mouth daily.     Yes Historical Provider, MD  clobetasol (TEMOVATE) 0.05 % external solution Apply 1 application topically 2 (two) times daily.   Yes Historical Provider, MD  ketoconazole (NIZORAL) 2 % shampoo Apply 1 application topically once a week. As directed 11/04/11  Yes Historical  Provider, MD  metoprolol tartrate (LOPRESSOR) 25 MG tablet Take 25 mg by mouth 2 (two) times daily. 12/19/11 12/18/12 Yes Gordy Savers, MD  simvastatin (ZOCOR) 40 MG tablet Take 40 mg by mouth every evening. 12/19/11 12/18/12 Yes Gordy Savers, MD    No Known Allergies  Physical Exam  Vitals  Blood pressure 142/70, pulse 71, temperature 97.4 F (36.3 C), temperature source Oral, resp. rate 17, SpO2 99.00%.   1. General well-nourished elderly male lying in bed in NAD,   2. Normal affect and insight, Not Suicidal or Homicidal, Awake Alert, Oriented X 3.  3. No F.N deficits, ALL C.Nerves Intact, Strength 5/5 all 4 extremities, Sensation intact all 4 extremities, Plantars down going.  4. Ears and Eyes appear Normal, Conjunctivae clear, PERRLA. Moist Oral Mucosa.  5. Supple Neck, No JVD, No cervical lymphadenopathy appriciated, No Carotid Bruits.  6. Symmetrical Chest wall movement, Good air movement bilaterally, CTAB.  7. RRR, No Gallops, Rubs or Murmurs, No Parasternal Heave.  8. Positive Bowel Sounds, Abdomen Soft, Non tender, No organomegaly appriciated,No rebound -guarding or rigidity.  9.  No Cyanosis, Normal Skin Turgor, No Skin Rash or Bruise.  10. Good muscle tone,  joints appear normal , no effusions, Normal ROM.  11. No Palpable Lymph Nodes in Neck or Axillae    Data Review  CBC  Lab 05/14/12 1712  WBC 7.3  HGB 13.5  HCT 39.7  PLT 131*  MCV 95.9  MCH 32.6  MCHC 34.0  RDW 13.3  LYMPHSABS 1.3  MONOABS 0.2  EOSABS 0.1  BASOSABS 0.0  BANDABS --   ------------------------------------------------------------------------------------------------------------------  Chemistries   Lab 05/14/12 1712  NA 142  K 4.1  CL 105  CO2 25  GLUCOSE 115*  BUN 25*  CREATININE 1.01  CALCIUM 9.4  MG --  AST 19  ALT 15  ALKPHOS 67  BILITOT 0.3    ------------------------------------------------------------------------------------------------------------------ CrCl is unknown because both a height and weight (above a minimum accepted value) are required for this calculation. ------------------------------------------------------------------------------------------------------------------ No results found for this basename: TSH,T4TOTAL,FREET3,T3FREE,THYROIDAB in the last 72 hours   Coagulation profile  Lab 05/14/12 1712  INR 1.05  PROTIME --   ------------------------------------------------------------------------------------------------------------------- No results found for this basename: DDIMER:2 in the last 72 hours -------------------------------------------------------------------------------------------------------------------  Cardiac Enzymes No results found for this basename: CK:3,CKMB:3,TROPONINI:3,MYOGLOBIN:3 in the last 168 hours ------------------------------------------------------------------------------------------------------------------ No components found with this basename: POCBNP:3   ---------------------------------------------------------------------------------------------------------------  Urinalysis    Component Value Date/Time   COLORURINE YELLOW 05/14/2012 1802  APPEARANCEUR CLEAR 05/14/2012 1802   LABSPEC 1.015 05/14/2012 1802   PHURINE 7.0 05/14/2012 1802   GLUCOSEU NEGATIVE 05/14/2012 1802   HGBUR NEGATIVE 05/14/2012 1802   BILIRUBINUR NEGATIVE 05/14/2012 1802   KETONESUR NEGATIVE 05/14/2012 1802   PROTEINUR NEGATIVE 05/14/2012 1802   UROBILINOGEN 1.0 05/14/2012 1802   NITRITE NEGATIVE 05/14/2012 1802   LEUKOCYTESUR NEGATIVE 05/14/2012 1802    ----------------------------------------------------------------------------------------------------------------   Imaging results:   Ct Head Wo Contrast  05/14/2012  *RADIOLOGY REPORT*  Clinical Data: Loss of consciousness.  Unresponsive.  CT HEAD WITHOUT  CONTRAST  Technique:  Contiguous axial images were obtained from the base of the skull through the vertex without contrast.  Comparison: None.  Findings: No acute cortical infarct, hemorrhage, mass lesion is present.  Mild generalized atrophy is likely within normal limits for age.  The ventricles are of normal size.  No significant extra- axial fluid collection is present.  Atherosclerotic calcifications are present within the cavernous carotid arteries and at the dural margin of the vertebral arteries bilaterally.  The paranasal sinuses and mastoid air cells are clear.  The osseous skull is intact.  IMPRESSION:  1.  Normal CT appearance the brain. 2.  Atherosclerosis.   Original Report Authenticated By: Jamesetta Orleans. MATTERN, M.D.    Dg Chest Port 1 View  05/14/2012  *RADIOLOGY REPORT*  Clinical Data: Syncope  PORTABLE CHEST - 1 VIEW  Comparison: 10/19/2010  Findings: Increased interstitial markings without frank interstitial edema.  No focal consolidation. No pleural effusion or pneumothorax.  Cardiomediastinal silhouette is within normal limits. Postsurgical changes related to prior CABG.  IMPRESSION: Increased interstitial markings without frank interstitial edema.   Original Report Authenticated By: Charline Bills, M.D.     EKG reviewed showing ventricular rhythm at 70 beats per minute, normal sinus rhythm, no new ST or T wave changes.  Assessment & Plan  Active Problems:  Syncope  CAD (coronary artery disease)  Mixed hyperlipidemia    1. Syncope: This is most likely related to exertion or heat syncope, will check orthostasis, we'll continue with fluid resuscitation, as well we'll admit patient to telemetry unit, monitor for any arrhythmias, will check 2-D echo was, and will check bilateral carotid Dopplers, and we'll cycle 3 sets of troponins.  2. Coronary artery disease: Patient denies any chest pain, chest pressure or shortness of breath or palpitation, has negative troponins no EKG  changes, we'll continue him on aspirin beta blockers and statin, and if patient is orthostatic we'll have to hold his metoprolol.  3. Hyperlipidemia. Will continue patient on statin.   DVT Prophylaxis Heparin   AM Labs Ordered, also please review Full Orders  Family Communication: Admission, patients condition and plan of care including tests being ordered have been discussed with the patient and family at bedside who indicate understanding and agree with the plan and Code Status.  Code Status full code  Disposition Plan: Home  Time spent in minutes : 50 minutes  Condition GUARDED  Randol Kern, Fread Kottke M.D on 05/14/2012 at 8:41 PM   [facility] Office  [phone]

## 2012-05-14 NOTE — ED Notes (Addendum)
Per EMS pt was outside working all day and noted by family to be unresponsive, but breathing on his own. EMS arrived and noted the same. Pt was given fluid bolus and became more responsive. En route patient head of bed was raised and patient then became unresponsive. Another fluid bolus was given. On arrival to ED patient is a/ox3. EMS CBG 103.

## 2012-05-15 DIAGNOSIS — R55 Syncope and collapse: Secondary | ICD-10-CM

## 2012-05-15 LAB — CBC
HCT: 38.4 % — ABNORMAL LOW (ref 39.0–52.0)
Hemoglobin: 13 g/dL (ref 13.0–17.0)
MCH: 32.6 pg (ref 26.0–34.0)
MCHC: 33.9 g/dL (ref 30.0–36.0)
MCV: 96.2 fL (ref 78.0–100.0)
Platelets: 167 10*3/uL (ref 150–400)
RBC: 3.99 MIL/uL — ABNORMAL LOW (ref 4.22–5.81)
RDW: 13.4 % (ref 11.5–15.5)
WBC: 7.6 10*3/uL (ref 4.0–10.5)

## 2012-05-15 LAB — PHOSPHORUS: Phosphorus: 4.1 mg/dL (ref 2.3–4.6)

## 2012-05-15 LAB — BASIC METABOLIC PANEL
CO2: 30 mEq/L (ref 19–32)
GFR calc non Af Amer: 84 mL/min — ABNORMAL LOW (ref >90)
Glucose, Bld: 100 mg/dL — ABNORMAL HIGH (ref 70–99)
Potassium: 5.1 mEq/L (ref 3.5–5.1)
Sodium: 143 mEq/L (ref 135–145)

## 2012-05-15 LAB — MAGNESIUM: Magnesium: 2.3 mg/dL (ref 1.5–2.5)

## 2012-05-15 LAB — TROPONIN I: Troponin I: <0.3 ng/mL (ref <0.30)

## 2012-05-15 NOTE — Progress Notes (Signed)
*  PRELIMINARY RESULTS* Vascular Ultrasound Carotid Duplex (Doppler) has been completed.  Preliminary findings: Right= 40-59% ICA stenosis by velocity only. No significant plaque morphology. Left= 40-59% ICA stenosis. Bilateral antegrade vertebral flow.  Farrel Demark, RDMS, RVT  05/15/2012, 10:18 AM

## 2012-05-15 NOTE — Progress Notes (Signed)
  Echocardiogram 2D Echocardiogram has been performed.  Michael Johnston 05/15/2012, 6:11 PM

## 2012-05-15 NOTE — Progress Notes (Addendum)
[facility] PROGRESS NOTE  Michael Johnston:[id number] DOB: Feb 25, 1932 DOA: 05/14/2012 PCP: [name], MD  Assessment/Plan: Active Problems:  Mixed hyperlipidemia  Syncope  CAD (coronary artery disease)  1. Syncope: Possibly secondary to physical exertion, dehydration or heat syncope. Patient has had no prior such episodes. He denies any symptoms today. Cardiac enzymes are negative. Will follow 2-D echo. Carotid Doppler showed bilateral 40-59% ICA stenosis (will discuss with [facility]). Orthostatics negative. 2. CAD, status post CABG: Asymptomatic of chest pain or dyspnea. Cardiac enzymes negative. Continue metoprolol. 3. Hyperlipidemia: Continue statins.  Code Status: Full Family Communication: Discussed in detail with patient's son Mr. [name], updated care and answered questions. Disposition Plan: Home after completing workup.   Brief narrative: 78 y.o. male, with significant past medical history of coronary artery status post CABG, presents 4 episodes of loss of consciousness/syncope. He reports he has been working at his yard on morning of admission for a few hours, as well reports he has been mowing the yard for couple hours, and reports when he entered home sat on the chair he felt lightheaded and dizzy and then he does not remember what happened, family were at the bedside and they reported they thought he wasn't sleeping as patient started to snore, when they tried to wake him up he wouldn't arouse which prompted them to call EMS, patient was given fluid by paramedics where he started to become more responsive, and it's unclear if patient was hypotensive or not in ambulance, on presentation to ED patient's symptoms totally resolved where he was back at baseline, patient denies any focal neurological deficits, any weakness, tingling or numbness, as well as there was no tongue biting urinary or stool incontinence, or seizure-like activity, patient was giving total of 1 L  of normal saline in ED which he reports he is feeling much better now, patient denies any chest pain or chest pressure or palpitation or shortness of breath, CT brain did not show any acute finding, and as well EKG did not show any acute changes.   Consultants:  None  Procedures:  None  Antibiotics:  None  HPI/Subjective: Denies complaints. No chest pain, dyspnea, palpitations, headache, dizziness or lightheadedness. Great appetite.  Objective: Filed Vitals:   05/14/12 2047 05/14/12 2104 05/15/12 0414 05/15/12 1349  BP: 153/79 142/62 139/78 118/55  Pulse: 79 73  62  Temp:  97.7 F (36.5 C)  98.1 F (36.7 C)  TempSrc:  Oral  Oral  Resp:  18  18  Height:  6' (1.829 m)    Weight:  85.6 kg (188 lb 11.4 oz)    SpO2:  99% 98% 99%    Intake/Output Summary (Last 24 hours) at 05/15/12 1656 Last data filed at 05/15/12 0730  Gross per 24 hour  Intake    240 ml  Output      0 ml  Net    240 ml   Filed Weights   05/14/12 2104  Weight: 85.6 kg (188 lb 11.4 oz)    Exam:   General exam: Moderately built and nourished male patient sitting up in chair without any distress.  Respiratory system: Clear to auscultation. No increased work of breathing.  Cardiovascular system: S1-S2 heard, regular rate and rhythm. No JVD, murmurs, carotid bruits, gallops or pedal edema. Telemetry shows sinus bradycardia greater than 55 to sinus rhythm in the 70s.  Gastrointestinal system: Abdomen is nondistended, soft and nontender. Normal bowel sounds heard.  Central nervous system: Alert and oriented x3. No focal  neurological deficits.  Extremities: Symmetric 5 x 5 power.  Data Reviewed: Basic Metabolic Panel:  Lab 05/15/12 1610 05/14/12 1712  NA 143 142  K 5.1 4.1  CL 107 105  CO2 30 25  GLUCOSE 100* 115*  BUN 21 25*  CREATININE 0.79 1.01  CALCIUM 9.2 9.4  MG 2.3 --  PHOS 4.1 --   Liver Function Tests:  Lab 05/14/12 1712  AST 19  ALT 15  ALKPHOS 67  BILITOT 0.3  PROT 6.5    ALBUMIN 3.9   No results found for this basename: LIPASE:5,AMYLASE:5 in the last 168 hours No results found for this basename: AMMONIA:5 in the last 168 hours CBC:  Lab 05/15/12 0650 05/14/12 1712  WBC 7.6 7.3  NEUTROABS -- 5.7  HGB 13.0 13.5  HCT 38.4* 39.7  MCV 96.2 95.9  PLT 167 131*   Cardiac Enzymes:  Lab 05/15/12 0650 05/15/12 0056  CKTOTAL -- --  CKMB -- --  CKMBINDEX -- --  TROPONINI <0.30 <0.30   BNP (last 3 results)  Basename 05/14/12 1714  PROBNP 205.3   CBG: No results found for this basename: GLUCAP:5 in the last 168 hours  No results found for this or any previous visit (from the past 240 hour(s)).   Studies: Ct Head Wo Contrast  05/14/2012  *RADIOLOGY REPORT*  Clinical Data: Loss of consciousness.  Unresponsive.  CT HEAD WITHOUT CONTRAST  Technique:  Contiguous axial images were obtained from the base of the skull through the vertex without contrast.  Comparison: None.  Findings: No acute cortical infarct, hemorrhage, mass lesion is present.  Mild generalized atrophy is likely within normal limits for age.  The ventricles are of normal size.  No significant extra- axial fluid collection is present.  Atherosclerotic calcifications are present within the cavernous carotid arteries and at the dural margin of the vertebral arteries bilaterally.  The paranasal sinuses and mastoid air cells are clear.  The osseous skull is intact.  IMPRESSION:  1.  Normal CT appearance the brain. 2.  Atherosclerosis.   Original Report Authenticated By: Jamesetta Orleans. MATTERN, M.D.    Dg Chest Port 1 View  05/14/2012  *RADIOLOGY REPORT*  Clinical Data: Syncope  PORTABLE CHEST - 1 VIEW  Comparison: 10/19/2010  Findings: Increased interstitial markings without frank interstitial edema.  No focal consolidation. No pleural effusion or pneumothorax.  Cardiomediastinal silhouette is within normal limits. Postsurgical changes related to prior CABG.  IMPRESSION: Increased interstitial markings  without frank interstitial edema.   Original Report Authenticated By: Charline Bills, M.D.    Carotid Dopplers  Preliminary findings: Right= 40-59% ICA stenosis by velocity only. No significant plaque morphology. Left= 40-59% ICA stenosis. Bilateral antegrade vertebral flow.  Scheduled Meds:    . sodium chloride   Intravenous STAT  . aspirin  325 mg Oral Daily  . heparin  5,000 Units Subcutaneous Q8H  . metoprolol tartrate  25 mg Oral BID  . ondansetron (ZOFRAN) IV  4 mg Intravenous Once  . simvastatin  40 mg Oral QPM  . sodium chloride  1,000 mL Intravenous STAT  . sodium chloride  3 mL Intravenous Q12H  . DISCONTD: naloxone (NARCAN) injection  0.4 mg Intravenous Once   Continuous Infusions:    . sodium chloride 75 mL/hr at 05/15/12 1539    Time spent: 20 minutes.    [facility]  [facility] Pager [phone]. If 8PM-8AM, please contact night-coverage at [email or web address], password [facility] 05/15/2012, 4:56 PM  LOS: 1 day

## 2012-05-16 DIAGNOSIS — E860 Dehydration: Principal | ICD-10-CM | POA: Diagnosis present

## 2012-05-16 DIAGNOSIS — I959 Hypotension, unspecified: Secondary | ICD-10-CM | POA: Diagnosis not present

## 2012-05-16 NOTE — Discharge Summary (Signed)
Physician Discharge Summary  Michael Johnston WGN:[id number] DOB: [birth year] DOA: 05/14/2012  PCP: [name], MD  Admit date: 05/14/2012 Discharge date: 05/16/2012  Recommendations for Outpatient Follow-up:  1. Followup with Primary Medical Doctor in 5 days from hospital discharge. PCP may consider performing outpatient EEG. 2. [place] cardiology offices will arrange for outpatient heart monitor. Discussed with [facility] cardiology. 3. Followup with Dr. Charlton Haws: Patient has a prior appointment for October 2013. 4. Recommend repeating carotid Dopplers in 6 months.  Discharge Diagnoses:  Active Problems:  Mixed hyperlipidemia  Syncope  CAD (coronary artery disease)   Discharge Condition: Improved and stable.  Diet recommendation: Heart healthy.  Filed Weights   05/14/12 2104  Weight: 85.6 kg (188 lb 11.4 oz)    History of present illness:  78 year old male with history of CAD, CABG was brought to the emergency department by EMS with history of syncope. 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 This was about noon time. He then came in, took a glass of water and sat on a reclining chair. He started snoring but his wife realized that he was not responding. EMS was called. When they got there apparently he was pale, breathing shallow and had systolic blood pressure in the 60s. They've placed him on oxygen and he started coming around. He was placed in the ambulance and en route he passed out again and apparently his blood pressure was not recordable. He was resuscitated with IV fluids. By the time he arrived to the emergency department, his symptoms had totally resolved and he was back to baseline. He denied symptoms and was admitted for further evaluation and management. He apparently had urinary incontinence during this episode.  Hospital Course:  1. Syncope: Possibly due to  hypotension from dehydration and heat exposure. Admitted to telemetry without any arrhythmia alarms. Cardiac enzymes cycled and negative. EKG did not show acute changes. CT head negative. Carotid Dopplers showed bilateral 40-59% stenosis-discussed with vascular surgeon on call who indicated that this was less likely to be the primary cause for his symptoms and recommended outpatient followup with repeat carotid Dopplers in 6 months. 2-D echo shows  LVEF of 50-55%. Seizures seem less likely but may consider outpatient EEG. Obviously if patient has recurrent episodes then he will need further workup such as MRI brain and EEG. Discussed with [facility] cardiology who will assist with arranging outpatient heart monitor. Treated with IV fluids. 2. Hypotension: Likely secondary to heat exposure and dehydration. Resolved with IV fluids. No orthostatic blood pressure changes in hospital. 3. CAD, status post CABG: Asymptomatic of chest pain or dyspnea. Cardiac enzymes negative. Continue metoprolol. 4. Hyperlipidemia: Continue statins.  Procedures:  None  Consultations:  None  Discharge Exam:  Complaints: Patient denies complaints and is eager to go home. No dizziness, lightheadedness, chest pain or dyspnea. Ambulated halls yesterday without complaints.  Filed Vitals:   05/15/12 1349 05/15/12 1958 05/15/12 2111 05/16/12 0510  BP: 118/55 126/58  129/72  Pulse: 62 57 61 56  Temp: 98.1 F (36.7 C) 97.8 F (36.6 C)  97.7 F (36.5 C)  TempSrc: Oral Oral  Oral  Resp: 18 20  18   Height:      Weight:      SpO2: 99% 98%  98%    General exam: Moderately built and nourished male, comfortable.  Respiratory system: Clear to auscultation. No increased work of breathing.  Cardiovascular  system: S1-S2 heard, regular rate and rhythm. No JVD, murmurs, carotid bruits, gallops or pedal edema. Telemetry shows sinus bradycardia greater than 55 to sinus rhythm in the 60s.  Gastrointestinal system: Abdomen is  nondistended, soft and nontender. Normal bowel sounds heard.  Central nervous system: Alert and oriented x3. No focal neurological deficits.  Extremities: Symmetric 5 x 5 power.  Discharge Instructions  Discharge Orders    Future Appointments: Provider: Department: Dept Phone: Center:   07/07/2012 8:45 AM Gordy Savers, MD [facility] [phone] [facility]     Future Orders Please Complete By Expires   Diet - low sodium heart healthy      Increase activity slowly      Driving Restrictions      Comments:   Do not drive until seen and cleared by Primary Medical Doctor.   Call MD for:  persistant dizziness or light-headedness      Call MD for:  extreme fatigue        Medication List  As of 05/16/2012  1:18 PM   TAKE these medications         aspirin 325 MG tablet   Take 325 mg by mouth daily.      clobetasol 0.05 % external solution   Commonly known as: TEMOVATE   Apply 1 application topically 2 (two) times daily.      ketoconazole 2 % shampoo   Commonly known as: NIZORAL   Apply 1 application topically once a week. As directed      metoprolol tartrate 25 MG tablet   Commonly known as: LOPRESSOR   Take 25 mg by mouth 2 (two) times daily.      simvastatin 40 MG tablet   Commonly known as: ZOCOR   Take 40 mg by mouth every evening.           Follow-up Information    Follow up with [name], MD. Schedule an appointment as soon as possible for a visit in 5 days.   Contact information:   [address] [place] [address] [phone]       Schedule an appointment as soon as possible for a visit with Charlton Haws, MD. (MDs office will call to arrange heart monitor.)    Contact information:   [address] [place] [address] [phone]           The results of significant diagnostics from this hospitalization (including imaging, microbiology, ancillary and  laboratory) are listed below for reference.    Significant Diagnostic Studies: Ct Head Wo Contrast  05/14/2012  *RADIOLOGY REPORT*  Clinical Data: Loss of consciousness.  Unresponsive.  CT HEAD WITHOUT CONTRAST  Technique:  Contiguous axial images were obtained from the base of the skull through the vertex without contrast.  Comparison: None.  Findings: No acute cortical infarct, hemorrhage, mass lesion is present.  Mild generalized atrophy is likely within normal limits for age.  The ventricles are of normal size.  No significant extra- axial fluid collection is present.  Atherosclerotic calcifications are present within the cavernous carotid arteries and at the dural margin of the vertebral arteries bilaterally.  The paranasal sinuses and mastoid air cells are clear.  The osseous skull is intact.  IMPRESSION:  1.  Normal CT appearance the brain. 2.  Atherosclerosis.   Original Report Authenticated By: Jamesetta Orleans. MATTERN, M.D.    Dg Chest Port 1 View  05/14/2012  *RADIOLOGY REPORT*  Clinical Data: Syncope  PORTABLE CHEST - 1 VIEW  Comparison: 10/19/2010  Findings: Increased interstitial markings without frank interstitial edema.  No focal consolidation. No pleural effusion or pneumothorax.  Cardiomediastinal silhouette is within normal limits. Postsurgical changes related to prior CABG.  IMPRESSION: Increased interstitial markings without frank interstitial edema.   Original Report Authenticated By: Charline Bills, M.D.    2-D echo  Study Conclusions  - Left ventricle: The cavity size was normal. Wall thickness was normal. Systolic function was normal. The estimated ejection fraction was in the range of 50% to 55%. Wall motion was normal; there were no regional wall motion abnormalities. Doppler parameters are consistent with abnormal left ventricular relaxation (grade 1 diastolic dysfunction). - Mitral valve: Calcified annulus. - No AS   Carotid Dopplers  Summary: Right = 40-59% internal  carotid artery stenosis, by velocity only. No significant plaque morphology noted.  Left= 40-59% internal carotid artery stenosis, low to mid end of scale. Bilateral antegrade vertebral flow.    Microbiology: No results found for this or any previous visit (from the past 240 hour(s)).   Labs: Basic Metabolic Panel:  Lab 05/15/12 1610 05/14/12 1712  NA 143 142  K 5.1 4.1  CL 107 105  CO2 30 25  GLUCOSE 100* 115*  BUN 21 25*  CREATININE 0.79 1.01  CALCIUM 9.2 9.4  MG 2.3 --  PHOS 4.1 --   Liver Function Tests:  Lab 05/14/12 1712  AST 19  ALT 15  ALKPHOS 67  BILITOT 0.3  PROT 6.5  ALBUMIN 3.9   No results found for this basename: LIPASE:5,AMYLASE:5 in the last 168 hours No results found for this basename: AMMONIA:5 in the last 168 hours CBC:  Lab 05/15/12 0650 05/14/12 1712  WBC 7.6 7.3  NEUTROABS -- 5.7  HGB 13.0 13.5  HCT 38.4* 39.7  MCV 96.2 95.9  PLT 167 131*   Cardiac Enzymes:  Lab 05/15/12 0650 05/15/12 0056  CKTOTAL -- --  CKMB -- --  CKMBINDEX -- --  TROPONINI <0.30 <0.30   BNP: BNP (last 3 results)  Basename 05/14/12 1714  PROBNP 205.3   CBG: No results found for this basename: GLUCAP:5 in the last 168 hours  Other lab data:  1. Pro BNP: 205.3 2. Venous lactate: 2.1 3. UA: Negative for features of urinary tract infection.   Time coordinating discharge: greater than 30 minutes  Signed:  Valeda Corzine  [facility] 05/16/2012, 1:18 PM

## 2012-05-18 NOTE — ED Provider Notes (Signed)
I saw and evaluated the patient, reviewed the resident's note and I agree with the findings and plan.   Dalin Caldera, MD 05/18/12 0817 

## 2012-05-22 ENCOUNTER — Encounter: Payer: Self-pay | Admitting: Internal Medicine

## 2012-05-22 ENCOUNTER — Ambulatory Visit (INDEPENDENT_AMBULATORY_CARE_PROVIDER_SITE_OTHER): Payer: Medicare Other | Admitting: Internal Medicine

## 2012-05-22 VITALS — BP 110/70 | Temp 97.7°F | Wt 193.0 lb

## 2012-05-22 DIAGNOSIS — E860 Dehydration: Secondary | ICD-10-CM

## 2012-05-22 DIAGNOSIS — I6529 Occlusion and stenosis of unspecified carotid artery: Secondary | ICD-10-CM | POA: Insufficient documentation

## 2012-05-22 DIAGNOSIS — Z23 Encounter for immunization: Secondary | ICD-10-CM

## 2012-05-22 DIAGNOSIS — E782 Mixed hyperlipidemia: Secondary | ICD-10-CM

## 2012-05-22 DIAGNOSIS — I2510 Atherosclerotic heart disease of native coronary artery without angina pectoris: Secondary | ICD-10-CM

## 2012-05-22 NOTE — Progress Notes (Signed)
Subjective:    Patient ID: Michael Johnston, male    DOB: 23-May-1932, 78 y.o.   MRN: [id number]  HPI  78 year old patient who is seen today post hospital discharge. He was discharged 7 days ago after an episode of documented hypotension associated with syncope. This occurred after prolonged exposure to midday outdoor ambient temperatures.  He has felt well since his discharge without any orthostatic symptoms. Hospital evaluation included carotid artery duplex study that revealed 40-59% ICA stenoses. 2-D echocardiogram revealed an ejection fraction of 50-55%. No arrhythmia noted on telemetry monitoring. Head CT was negative. He was discharged on his preadmission medications that includes metoprolol 25 mg twice a day. He has coronary artery disease and is status post CABG. He feels that he is back to baseline and has resumed driving. He is scheduled for cardiology followup next month  Past Medical History  Diagnosis Date  . COLONIC POLYPS, HX OF   . Mixed hyperlipidemia   . CHEST PAIN   . HIATAL HERNIA, HX OF     History   Social History  . Marital Status: Married    Spouse Name: N/A    Number of Children: N/A  . Years of Education: N/A   Occupational History  . Not on file.   Social History Main Topics  . Smoking status: Former Games developer  . Smokeless tobacco: Never Used  . Alcohol Use: No  . Drug Use: No  . Sexually Active: Not on file   Other Topics Concern  . Not on file   Social History Narrative  . No narrative on file    Past Surgical History  Procedure Date  . Coronary artery bypass graft     Median sternotomy for coronary artery bypass grafting x3  (left internal mammary artery to distal left anterior  descending  coronary artery, saphenous vein graft to first diagonal branch,  saphenous vein graft to first  obtuse marginal branch, endoscopic  saphenous vein harvest from right thigh). SURGEON:  Salvatore Decent.  Cornelius Moras, MD  ASSISTANT:  Rowe Clack, PA-C  ANESTHESIA:  Guadalupe Maple, MD   . Status post partial colectomy and cholecystectomy, 1998 (ingram)     Family History  Problem Relation Age of Onset  . Lung cancer Sister     No Known Allergies  Current Outpatient Prescriptions on File Prior to Visit  Medication Sig Dispense Refill  . aspirin 325 MG tablet Take 325 mg by mouth daily.        . clobetasol (TEMOVATE) 0.05 % external solution Apply 1 application topically 2 (two) times daily.      Marland Kitchen ketoconazole (NIZORAL) 2 % shampoo Apply 1 application topically once a week. As directed      . metoprolol tartrate (LOPRESSOR) 25 MG tablet Take 25 mg by mouth 2 (two) times daily.      . simvastatin (ZOCOR) 40 MG tablet Take 40 mg by mouth every evening.      Marland Kitchen DISCONTD: metoprolol tartrate (LOPRESSOR) 25 MG tablet Take 1 tablet (25 mg total) by mouth 2 (two) times daily.  60 tablet  5  . DISCONTD: simvastatin (ZOCOR) 40 MG tablet Take 1 tablet (40 mg total) by mouth every evening.  30 tablet  5    BP 110/70  Temp 97.7 F (36.5 C) (Oral)  Wt 193 lb (87.544 kg)       Review of Systems  Constitutional: Negative for fever, chills, appetite change and fatigue.  HENT: Negative for hearing loss, ear  pain, congestion, sore throat, trouble swallowing, neck stiffness, dental problem, voice change and tinnitus.   Eyes: Negative for pain, discharge and visual disturbance.  Respiratory: Negative for cough, chest tightness, wheezing and stridor.   Cardiovascular: Negative for chest pain, palpitations and leg swelling.  Gastrointestinal: Negative for nausea, vomiting, abdominal pain, diarrhea, constipation, blood in stool and abdominal distention.  Genitourinary: Negative for urgency, hematuria, flank pain, discharge, difficulty urinating and genital sores.  Musculoskeletal: Negative for myalgias, back pain, joint swelling, arthralgias and gait problem.  Skin: Negative for rash.  Neurological: Negative for dizziness, syncope, speech difficulty, weakness,  numbness and headaches.  Hematological: Negative for adenopathy. Does not bruise/bleed easily.  Psychiatric/Behavioral: Negative for behavioral problems and dysphoric mood. The patient is not nervous/anxious.        Objective:   Physical Exam  Constitutional: He is oriented to person, place, and time. He appears well-developed.       Blood pressure 130/70 sitting and standing  HENT:  Head: Normocephalic.  Right Ear: External ear normal.  Left Ear: External ear normal.  Eyes: Conjunctivae normal and EOM are normal.  Neck: Normal range of motion.  Cardiovascular: Normal rate and normal heart sounds.   Pulmonary/Chest: Breath sounds normal.  Abdominal: Bowel sounds are normal.  Musculoskeletal: Normal range of motion. He exhibits no edema and no tenderness.  Neurological: He is alert and oriented to person, place, and time.  Psychiatric: He has a normal mood and affect. His behavior is normal.          Assessment & Plan:   History of syncope related to hypotension in the setting of volume contraction Noncritical coronary artery disease CAD stable Dyslipidemia  Patient has followup with cardiology next month. We'll see here in one year or as needed. Will consider follow carotid artery Doppler study in one year  Flu Vac

## 2012-05-22 NOTE — Patient Instructions (Signed)
Limit your sodium (Salt) intake    It is important that you exercise regularly, at least 20 minutes 3 to 4 times per week.  If you develop chest pain or shortness of breath seek  medical attention. 

## 2012-06-08 ENCOUNTER — Ambulatory Visit (INDEPENDENT_AMBULATORY_CARE_PROVIDER_SITE_OTHER): Payer: Medicare Other | Admitting: Family Medicine

## 2012-06-08 ENCOUNTER — Ambulatory Visit (INDEPENDENT_AMBULATORY_CARE_PROVIDER_SITE_OTHER): Payer: Medicare Other | Admitting: Cardiovascular Disease

## 2012-06-08 ENCOUNTER — Encounter: Payer: Self-pay | Admitting: Cardiovascular Disease

## 2012-06-08 ENCOUNTER — Encounter: Payer: Self-pay | Admitting: Family Medicine

## 2012-06-08 VITALS — BP 140/74 | Temp 97.7°F | Wt 193.0 lb

## 2012-06-08 VITALS — BP 148/75 | HR 54 | Wt 193.0 lb

## 2012-06-08 DIAGNOSIS — R55 Syncope and collapse: Secondary | ICD-10-CM

## 2012-06-08 DIAGNOSIS — I2510 Atherosclerotic heart disease of native coronary artery without angina pectoris: Secondary | ICD-10-CM

## 2012-06-08 DIAGNOSIS — E782 Mixed hyperlipidemia: Secondary | ICD-10-CM

## 2012-06-08 DIAGNOSIS — I6529 Occlusion and stenosis of unspecified carotid artery: Secondary | ICD-10-CM

## 2012-06-08 DIAGNOSIS — H6120 Impacted cerumen, unspecified ear: Secondary | ICD-10-CM

## 2012-06-08 NOTE — Progress Notes (Signed)
Patient ID: Michael Johnston, male   DOB: 01-Sep-1932, 78 y.o.   MRN: [id number] History of present illness:  78 year old male with history of CAD, CABG was brought to the emergency department by EMS with history of syncope. Per subsequent history gathered from patient and his son Mr. 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 This was about noon time. He then came in, took a glass of water and sat on a reclining chair. He started snoring but his wife realized that he was not responding. EMS was called. When they got there apparently he was pale, breathing shallow and had systolic blood pressure in the 60s. They've placed him on oxygen and he started coming around. He was placed in the ambulance and en route he passed out again and apparently his blood pressure was not recordable. He was resuscitated with IV fluids. By the time he arrived to the emergency department, his symptoms had totally resolved and he was back to baseline. He denied symptoms and was admitted for further evaluation and management. He apparently had urinary incontinence during this episode.  Hospital Course:  1. Syncope: Possibly due to hypotension from dehydration and heat exposure. Admitted to telemetry without any arrhythmia alarms. Cardiac enzymes cycled and negative. EKG did not show acute changes. CT head negative. Carotid Dopplers showed bilateral 40-59% stenosis-discussed with vascular surgeon on call who indicated that this was less likely to be the primary cause for his symptoms and recommended outpatient followup with repeat carotid Dopplers in 6 months. 2-D echo shows LVEF of 50-55%. Seizures seem less likely but may consider outpatient EEG. Obviously if patient has recurrent episodes then he will need further workup such as MRI brain and EEG. Discussed with [facility] cardiology who will assist with arranging outpatient heart monitor. Treated with IV fluids. 2. Hypotension: Likely secondary to  heat exposure and dehydration. Resolved with IV fluids. No orthostatic blood pressure changes in hospital. 3. CAD, status post CABG: Asymptomatic of chest pain or dyspnea. Cardiac enzymes negative. Continue metoprolol. 4. Hyperlipidemia: Continue statins.   Since D/C doing fine No need for monitor as he has no palpitations.  No chest pain. No recurrence  His left ear "closes up on him"  ROS: Denies fever, malais, weight loss, blurry vision, decreased visual acuity, cough, sputum, SOB, hemoptysis, pleuritic pain, palpitaitons, heartburn, abdominal pain, melena, lower extremity edema, claudication, or rash.  All other systems reviewed and negative  General: Affect appropriate Healthy:  appears stated age HEENT: normal Neck supple with no adenopathy JVP normal no bruits no thyromegaly Lungs clear with no wheezing and good diaphragmatic motion Heart:  S1/S2 no murmur, no rub, gallop or click PMI normal Abdomen: benighn, BS positve, no tenderness, no AAA no bruit.  No HSM or HJR Distal pulses intact with no bruits No edema Neuro non-focal Skin warm and dry No muscular weakness   Current Outpatient Prescriptions  Medication Sig Dispense Refill  . aspirin 325 MG tablet Take 325 mg by mouth daily.        . clobetasol (TEMOVATE) 0.05 % external solution Apply 1 application topically 2 (two) times daily.      Marland Kitchen ketoconazole (NIZORAL) 2 % shampoo Apply 1 application topically once a week. As directed      . metoprolol tartrate (LOPRESSOR) 25 MG tablet Take 25 mg by mouth 2 (two) times daily.      . simvastatin (ZOCOR) 40 MG tablet Take 40 mg by mouth every evening.      Marland Kitchen  DISCONTD: metoprolol tartrate (LOPRESSOR) 25 MG tablet Take 1 tablet (25 mg total) by mouth 2 (two) times daily.  60 tablet  5  . DISCONTD: simvastatin (ZOCOR) 40 MG tablet Take 1 tablet (40 mg total) by mouth every evening.  30 tablet  5    Allergies  Review of patient's allergies indicates no known  allergies.  Electrocardiogram:  Assessment and Plan

## 2012-06-08 NOTE — Progress Notes (Addendum)
Chief Complaint  Patient presents with  . ear irrigation    HPI:  Has R ear problem: -feels like ear is wash out -this sometimes is a problem with him -has had his ears washed out before -reports has had a little dizziness a few times when turns head a certain way all of a sudden  - but saw his doctor and his cardiologist recently -saw cards today and told to come here to clean out his ears -no change in hearing or roaring or pulsing in ears, no pain in ears, mild nasal congestion   Past Medical History  Diagnosis Date  . COLONIC POLYPS, HX OF   . Mixed hyperlipidemia   . CHEST PAIN   . HIATAL HERNIA, HX OF     Family History  Problem Relation Age of Onset  . Lung cancer Sister     History   Social History  . Marital Status: Married    Spouse Name: N/A    Number of Children: N/A  . Years of Education: N/A   Social History Main Topics  . Smoking status: Former Games developer  . Smokeless tobacco: Never Used  . Alcohol Use: No  . Drug Use: No  . Sexually Active: None   Other Topics Concern  . None   Social History Narrative  . None    Current outpatient prescriptions:aspirin 325 MG tablet, Take 325 mg by mouth daily.  , Disp: , Rfl: ;  clobetasol (TEMOVATE) 0.05 % external solution, Apply 1 application topically 2 (two) times daily., Disp: , Rfl: ;  ketoconazole (NIZORAL) 2 % shampoo, Apply 1 application topically once a week. As directed, Disp: , Rfl: ;  metoprolol tartrate (LOPRESSOR) 25 MG tablet, Take 25 mg by mouth 2 (two) times daily., Disp: , Rfl:  simvastatin (ZOCOR) 40 MG tablet, Take 40 mg by mouth every evening., Disp: , Rfl: ;  DISCONTD: metoprolol tartrate (LOPRESSOR) 25 MG tablet, Take 1 tablet (25 mg total) by mouth 2 (two) times daily., Disp: 60 tablet, Rfl: 5;  DISCONTD: simvastatin (ZOCOR) 40 MG tablet, Take 1 tablet (40 mg total) by mouth every evening., Disp: 30 tablet, Rfl: 5  EXAM:  Filed Vitals:   06/08/12 1035  BP: 140/74  Temp: 97.7 F (36.5  C)    There is no height on file to calculate BMI.  GENERAL: vitals reviewed and listed below, alert, oriented, appears well hydrated and in no acute distress  HEENT: atraumatic, conjucntiva clear, no obvious abnormalities on inspection of external nose and ears, soft wax bilaterally in ear canals  NECK: no masses on inspection  MS: moves all extremities without noticeable abnormality  PSYCH: pleasant and cooperative, no obvious depression or anxiety  ASSESSMENT AND PLAN:  Discussed the following assessment and plan:  1. Cerumen impaction  Ear wax removal   -ear wax removed with warm water flushing and curette and patient reports feeling better - pt did not tolerate curette well which cause him to cough so this was stopped with a small amount of cerumen remaining in R ear canal - debrox recommended for this -follow up with PCP if concerns  Orders Placed This Encounter  Procedures  . Ear wax removal    There are no Patient Instructions on file for this visit.  Return to clinic immediately if symptoms worsen or persist or new concerns.  No Follow-up on file.  Kriste Basque R.

## 2012-06-08 NOTE — Assessment & Plan Note (Signed)
No obvious cardiac etiology Observe  Stay hydrated

## 2012-06-08 NOTE — Patient Instructions (Addendum)
-  please mix hydrogen peroxide half and half with distilled water and put 2 drops in R ear daily for ear wax OR you can use debrox ear cleaning solution which you can buy at most drugstores  -please follow up with your doctor if continued symptoms

## 2012-06-08 NOTE — Assessment & Plan Note (Signed)
Cholesterol is at goal.  Continue current dose of statin and diet Rx.  No myalgias or side effects.  F/U  LFT's in 6 months. Lab Results  Component Value Date   LDLCALC 51 07/05/2011

## 2012-06-08 NOTE — Assessment & Plan Note (Signed)
Stable with no angina and good activity level.  Continue medical Rx  

## 2012-06-08 NOTE — Assessment & Plan Note (Signed)
40-59% disease bilaterally not hemodynamically significant.  F/U duplex in 6 months

## 2012-06-08 NOTE — Patient Instructions (Signed)
Your physician recommends that you schedule a follow-up appointment in:  3 MONTHS WITH  DR NISHAN  Your physician recommends that you continue on your current medications as directed. Please refer to the Current Medication list given to you today.  

## 2012-06-30 ENCOUNTER — Other Ambulatory Visit: Payer: Self-pay | Admitting: Internal Medicine

## 2012-07-07 ENCOUNTER — Encounter: Payer: Medicare Other | Admitting: Internal Medicine

## 2012-07-21 ENCOUNTER — Other Ambulatory Visit: Payer: Self-pay | Admitting: Internal Medicine

## 2012-07-21 NOTE — Telephone Encounter (Signed)
Med filled.  

## 2012-07-24 ENCOUNTER — Telehealth: Payer: Self-pay | Admitting: *Deleted

## 2012-07-24 NOTE — Telephone Encounter (Signed)
Following up on Mr. Szwed monitor that was ordered by Dr.Nishan sept 2013. Patient refused monitor. Mr. Durkee wants to wait until he talks to Dr. Eden Emms. Per [facility] office note 06/08/12. patient doesn't need monitor.

## 2012-09-01 ENCOUNTER — Encounter: Payer: Self-pay | Admitting: Cardiology

## 2012-09-03 ENCOUNTER — Ambulatory Visit (INDEPENDENT_AMBULATORY_CARE_PROVIDER_SITE_OTHER): Payer: Medicare Other | Admitting: Cardiovascular Disease

## 2012-09-03 ENCOUNTER — Encounter: Payer: Self-pay | Admitting: Cardiovascular Disease

## 2012-09-03 VITALS — BP 136/76 | HR 57 | Resp 18 | Ht 72.0 in | Wt 202.0 lb

## 2012-09-03 DIAGNOSIS — E782 Mixed hyperlipidemia: Secondary | ICD-10-CM

## 2012-09-03 DIAGNOSIS — I6529 Occlusion and stenosis of unspecified carotid artery: Secondary | ICD-10-CM

## 2012-09-03 NOTE — Assessment & Plan Note (Signed)
Stable with no angina and good activity level.  Continue medical Rx  

## 2012-09-03 NOTE — Assessment & Plan Note (Signed)
Asymptomatic Continue statin and asa  Duplex in 3/14  40-59% bilateral disease

## 2012-09-03 NOTE — Progress Notes (Signed)
Patient ID: Michael Johnston, male   DOB: October 04, 1931, 78 y.o.   MRN: [id number] Michael Johnston is seen for F/U CAD SEMI after getting injections at the dentist office September 27, 2010. Subsequently found to have 3VD needing CABG:  Note the RCA was not grafted   1/12  Left internal mammary artery to the distal LAD, coronary artery a  saphenous vein graft to the first diagonal branch, and a saphenous  vein graft to the first obtuse marginal branch. He had endoscopic  saphenous vein harvest from the right thigh. He tolerated  procedure well, was taken to the surgical intensive care unit in  stable condition. Operative findings showed normal left  ventricular function, good-quality conduits, good quality targets.  Duplex in September showed 40-59% bilateral carotid disease.  Needs lipids and liver done   ROS: Denies fever, malais, weight loss, blurry vision, decreased visual acuity, cough, sputum, SOB, hemoptysis, pleuritic pain, palpitaitons, heartburn, abdominal pain, melena, lower extremity edema, claudication, or rash.  All other systems reviewed and negative  General: Affect appropriate Healthy:  appears stated age HEENT: normal Neck supple with no adenopathy JVP normal no bruits no thyromegaly  S/P sternotomy Lungs clear with no wheezing and good diaphragmatic motion Heart:  S1/S2 no murmur, no rub, gallop or click PMI normal Abdomen: benighn, BS positve, no tenderness, no AAA no bruit.  No HSM or HJR Distal pulses intact with no bruits No edema Neuro non-focal Skin warm and dry No muscular weakness   Current Outpatient Prescriptions  Medication Sig Dispense Refill  . aspirin 325 MG tablet Take 325 mg by mouth daily.        . clobetasol (TEMOVATE) 0.05 % external solution Apply 1 application topically 2 (two) times daily.      Marland Kitchen ketoconazole (NIZORAL) 2 % shampoo Apply 1 application topically once a week. As directed      . metoprolol tartrate (LOPRESSOR) 25 MG tablet TAKE 1 TABLET  BY MOUTH 2 TIMES A DAY  60 tablet  5  . simvastatin (ZOCOR) 40 MG tablet Take 40 mg by mouth every evening.      . simvastatin (ZOCOR) 40 MG tablet TAKE ONE TABLET BY MOUTH EVERY EVENING  30 tablet  5  . [DISCONTINUED] metoprolol tartrate (LOPRESSOR) 25 MG tablet Take 1 tablet (25 mg total) by mouth 2 (two) times daily.  60 tablet  5  . [DISCONTINUED] simvastatin (ZOCOR) 40 MG tablet Take 1 tablet (40 mg total) by mouth every evening.  30 tablet  5    Allergies  Review of patient's allergies indicates no known allergies.  Electrocardiogram:   05/26/12  SR rate 70  Normal ECG  Assessment and Plan

## 2012-09-03 NOTE — Patient Instructions (Addendum)
Have fasting lab work January 7,2014: cholesterol & lft.  We will call you with your results.  Your physician has requested that you have a carotid duplex in March 2014. This test is an ultrasound of the carotid arteries in your neck. It looks at blood flow through these arteries that supply the brain with blood. Allow one hour for this exam. There are no restrictions or special instructions.  Your physician recommends that you continue on your current medications as directed. Please refer to the Current Medication list given to you today.  Your physician wants you to follow-up in: 6 months with Dr. Haywood Filler will receive a reminder letter in the mail two months in advance. If you don't receive a letter, please call our office to schedule the follow-up appointment.

## 2012-09-03 NOTE — Assessment & Plan Note (Signed)
Continue statin labs this week

## 2012-09-15 ENCOUNTER — Other Ambulatory Visit: Payer: Medicare Other

## 2012-09-16 ENCOUNTER — Other Ambulatory Visit (INDEPENDENT_AMBULATORY_CARE_PROVIDER_SITE_OTHER): Payer: Medicare Other

## 2012-09-16 DIAGNOSIS — E782 Mixed hyperlipidemia: Secondary | ICD-10-CM

## 2012-09-16 LAB — HEPATIC FUNCTION PANEL
Albumin: 4.3 g/dL (ref 3.5–5.2)
Bilirubin, Direct: 0.3 mg/dL (ref 0.0–0.3)
Total Protein: 7.2 g/dL (ref 6.0–8.3)

## 2012-09-16 LAB — LIPID PANEL
HDL: 57.1 mg/dL (ref >39.00)
Total CHOL/HDL Ratio: 2
Triglycerides: 54 mg/dL (ref 0.0–149.0)

## 2012-12-04 ENCOUNTER — Encounter (INDEPENDENT_AMBULATORY_CARE_PROVIDER_SITE_OTHER): Payer: Medicare Other

## 2012-12-04 DIAGNOSIS — I6529 Occlusion and stenosis of unspecified carotid artery: Secondary | ICD-10-CM

## 2012-12-11 ENCOUNTER — Telehealth: Payer: Self-pay | Admitting: Cardiovascular Disease

## 2012-12-11 NOTE — Telephone Encounter (Signed)
New Problem:    Patient called in returning your call regarding his Carotid results.  Please call back.

## 2012-12-11 NOTE — Telephone Encounter (Signed)
PT AWARE OF CAROTID RESULTS ./CY 

## 2012-12-11 NOTE — Telephone Encounter (Signed)
Follow-up: ° ° ° °Called in returning your call.  Please call back. °

## 2012-12-30 ENCOUNTER — Other Ambulatory Visit: Payer: Self-pay | Admitting: Internal Medicine

## 2013-01-18 ENCOUNTER — Other Ambulatory Visit: Payer: Self-pay | Admitting: Internal Medicine

## 2013-05-31 ENCOUNTER — Other Ambulatory Visit: Payer: Self-pay | Admitting: Internal Medicine

## 2013-06-02 NOTE — Telephone Encounter (Signed)
Pt is out of bp med. Pt has cpx sch for 09-14-2013

## 2013-06-08 ENCOUNTER — Other Ambulatory Visit: Payer: Self-pay

## 2013-06-08 MED ORDER — METOPROLOL TARTRATE 25 MG PO TABS: ORAL_TABLET | ORAL | Status: DC

## 2013-07-14 ENCOUNTER — Other Ambulatory Visit: Payer: Self-pay | Admitting: Internal Medicine

## 2013-07-28 ENCOUNTER — Encounter: Payer: Self-pay | Admitting: Cardiovascular Disease

## 2013-07-28 ENCOUNTER — Ambulatory Visit (INDEPENDENT_AMBULATORY_CARE_PROVIDER_SITE_OTHER): Payer: Medicare Other | Admitting: Cardiovascular Disease

## 2013-07-28 VITALS — BP 132/70 | HR 54 | Ht 72.0 in | Wt 195.0 lb

## 2013-07-28 DIAGNOSIS — I2510 Atherosclerotic heart disease of native coronary artery without angina pectoris: Secondary | ICD-10-CM

## 2013-07-28 DIAGNOSIS — I658 Occlusion and stenosis of other precerebral arteries: Secondary | ICD-10-CM

## 2013-07-28 DIAGNOSIS — R079 Chest pain, unspecified: Secondary | ICD-10-CM

## 2013-07-28 DIAGNOSIS — I6523 Occlusion and stenosis of bilateral carotid arteries: Secondary | ICD-10-CM

## 2013-07-28 DIAGNOSIS — E782 Mixed hyperlipidemia: Secondary | ICD-10-CM

## 2013-07-28 DIAGNOSIS — I6529 Occlusion and stenosis of unspecified carotid artery: Secondary | ICD-10-CM

## 2013-07-28 MED ORDER — NITROGLYCERIN 0.4 MG SL SUBL: 0.4000 mg | SUBLINGUAL_TABLET | SUBLINGUAL | Status: DC | PRN

## 2013-07-28 NOTE — Assessment & Plan Note (Signed)
Chol has been good. However concerns of memory problems on statin Stop for 2 months and reassess Seeing primary in January

## 2013-07-28 NOTE — Assessment & Plan Note (Signed)
F/U duplex 3/15  Known moderate bilateral disease ASA

## 2013-07-28 NOTE — Patient Instructions (Addendum)
Your physician wants you to follow-up in:   6 MONTHS WITH  DR Haywood Filler will receive a reminder letter in the mail two months in advance. If you don't receive a letter, please call our office to schedule the follow-up appointment. Your physician has recommended you make the following change in your medication: HOLD  SIMVASTATIN  Your physician has requested that you have en exercise stress myoview. For further information please visit [email or web address] Please follow instruction sheet, as given. Your physician has requested that you have a carotid duplex. This test is an ultrasound of the carotid arteries in your neck. It looks at blood flow through these arteries that supply the brain with blood. Allow one hour for this exam. There are no restrictions or special instructions. DUE IN [facility]  2015

## 2013-07-28 NOTE — Progress Notes (Signed)
Patient ID: Michael Johnston, male   DOB: 04-Jul-1932, 79 y.o.   MRN: [id number] Michael Johnston is seen for F/U CAD SEMI after getting injections at the dentist office September 27, 2010. Subsequently found to have 3VD needing CABG:  Note the RCA was not grafted  1/12  Left internal mammary artery to the distal LAD, coronary artery a  saphenous vein graft to the first diagonal branch, and a saphenous  vein graft to the first obtuse marginal branch. He had endoscopic  saphenous vein harvest from the right thigh. He tolerated  procedure well, was taken to the surgical intensive care unit in  stable condition. Operative findings showed normal left  ventricular function, good-quality conduits, good quality targets.   Duplex in 12/04/12 reviewed showed 40-59% bilateral carotid disease. Needs lipids and liver done  09/16/12  LDL under 60 with normal LFTls   Having some chest pains. Not always with activity. Central left side ? Indigestion but no help with burbing. Lasts a minute or two Has no nitro at home. Wife wondering if statin making memory worse  ROS: Denies fever, malais, weight loss, blurry vision, decreased visual acuity, cough, sputum, SOB, hemoptysis, pleuritic pain, palpitaitons, heartburn, abdominal pain, melena, lower extremity edema, claudication, or rash.  All other systems reviewed and negative  General: Affect appropriate Healthy:  appears stated age HEENT: normal Neck supple with no adenopathy JVP normal no bruits no thyromegaly Lungs clear with no wheezing and good diaphragmatic motion Heart:  S1/S2 no murmur, no rub, gallop or click PMI normal Abdomen: benighn, BS positve, no tenderness, no AAA no bruit.  No HSM or HJR Distal pulses intact with no bruits No edema Neuro non-focal Skin warm and dry No muscular weakness Missing thumb on right hand   Current Outpatient Prescriptions  Medication Sig Dispense Refill  . aspirin 325 MG tablet Take 325 mg by mouth daily.        .  clobetasol (TEMOVATE) 0.05 % external solution Apply 1 application topically 2 (two) times daily.      Marland Kitchen ketoconazole (NIZORAL) 2 % shampoo Apply 1 application topically once a week. As directed      . metoprolol tartrate (LOPRESSOR) 25 MG tablet TAKE 1 TABLET BY MOUTH 2 TIMES A DAY  60 tablet  0  . simvastatin (ZOCOR) 40 MG tablet TAKE ONE TABLET BY MOUTH EVERY EVENING  30 tablet  3   No current facility-administered medications for this visit.    Allergies  Review of patient's allergies indicates no known allergies.  Electrocardiogram: NSR rate 54 T wave inversion lead 3  Assessment and Plan

## 2013-07-28 NOTE — Assessment & Plan Note (Signed)
Has had some chest pain. T wave change lead 3 RCA not grafted F/U stress myovue Nitro called in Continue ASA and beta blocker

## 2013-08-02 ENCOUNTER — Other Ambulatory Visit: Payer: Self-pay | Admitting: Cardiovascular Disease

## 2013-08-16 ENCOUNTER — Encounter: Payer: Self-pay | Admitting: Cardiology

## 2013-08-17 ENCOUNTER — Ambulatory Visit (HOSPITAL_COMMUNITY): Payer: Medicare Other | Attending: Cardiovascular Disease | Admitting: Radiology

## 2013-08-17 VITALS — BP 135/80 | HR 57 | Ht 72.0 in | Wt 194.0 lb

## 2013-08-17 DIAGNOSIS — R079 Chest pain, unspecified: Secondary | ICD-10-CM | POA: Insufficient documentation

## 2013-08-17 DIAGNOSIS — R9431 Abnormal electrocardiogram [ECG] [EKG]: Secondary | ICD-10-CM | POA: Insufficient documentation

## 2013-08-17 DIAGNOSIS — I252 Old myocardial infarction: Secondary | ICD-10-CM | POA: Insufficient documentation

## 2013-08-17 DIAGNOSIS — E785 Hyperlipidemia, unspecified: Secondary | ICD-10-CM | POA: Insufficient documentation

## 2013-08-17 DIAGNOSIS — I739 Peripheral vascular disease, unspecified: Secondary | ICD-10-CM | POA: Insufficient documentation

## 2013-08-17 DIAGNOSIS — I779 Disorder of arteries and arterioles, unspecified: Secondary | ICD-10-CM | POA: Insufficient documentation

## 2013-08-17 DIAGNOSIS — I10 Essential (primary) hypertension: Secondary | ICD-10-CM | POA: Insufficient documentation

## 2013-08-17 DIAGNOSIS — R42 Dizziness and giddiness: Secondary | ICD-10-CM | POA: Insufficient documentation

## 2013-08-17 DIAGNOSIS — I2510 Atherosclerotic heart disease of native coronary artery without angina pectoris: Secondary | ICD-10-CM

## 2013-08-17 DIAGNOSIS — Z87891 Personal history of nicotine dependence: Secondary | ICD-10-CM | POA: Insufficient documentation

## 2013-08-17 DIAGNOSIS — Z951 Presence of aortocoronary bypass graft: Secondary | ICD-10-CM | POA: Insufficient documentation

## 2013-08-17 MED ORDER — REGADENOSON 0.4 MG/5ML IV SOLN
0.4000 mg | Freq: Once | INTRAVENOUS | Status: AC
  Administered 2013-08-17: 0.4 mg via INTRAVENOUS

## 2013-08-17 MED ORDER — TECHNETIUM TC 99M SESTAMIBI GENERIC - CARDIOLITE
33.0000 | Freq: Once | INTRAVENOUS | Status: AC | PRN
  Administered 2013-08-17: 33 via INTRAVENOUS

## 2013-08-17 MED ORDER — TECHNETIUM TC 99M SESTAMIBI GENERIC - CARDIOLITE
11.0000 | Freq: Once | INTRAVENOUS | Status: AC | PRN
  Administered 2013-08-17: 11 via INTRAVENOUS

## 2013-08-17 NOTE — Progress Notes (Signed)
[facility] SITE 3 NUCLEAR MED [address] [place], [place] [address] [phone]    Cardiology Nuclear Med Study  Michael Johnston is a 79 y.o. male     MRN : [id number]     DOB: [birth year]  Procedure Date: 08/17/2013  Nuclear Med Background Indication for Stress Test:  Evaluation for Ischemia, Graft Patency and Abnormal EKG:T wave changes History:  CAD, MI 2012, CABG 2012, Echo 2013 EF 50-55%, COPD Cardiac Risk Factors: Carotid Disease, History of Smoking, Hypertension, Lipids and PVD  Symptoms:  Chest Pain, Chest Pain with Exertion and Dizziness   Nuclear Pre-Procedure Caffeine/Decaff Intake:  None NPO After: 7:00pm   Lungs:  clear O2 Sat: 98% on room air. IV 0.9% NS with Angio Cath:  20g  IV Site: R Hand  IV Started by:  Cathlyn Parsons, RN  Chest Size (in):  42 Cup Size: n/a  Height: 6' (1.829 m)  Weight:  194 lb (87.998 kg)  BMI:  Body mass index is 26.31 kg/(m^2). Tech Comments:  Lopressor taken at 0735 this am    Nuclear Med Study 1 or 2 day study: 1 day  Stress Test Type:  Treadmill/Lexiscan  Reading MD: Willa Rough, MD  Order Authorizing Provider:  Burna Cash  Resting Radionuclide: Technetium 57m Sestamibi  Resting Radionuclide Dose: 11.0 mCi   Stress Radionuclide:  Technetium 19m Sestamibi  Stress Radionuclide Dose: 33.0 mCi           Stress Protocol Rest HR: 57 Stress HR: 94  Rest BP: 135/80 Stress BP: 169/65  Exercise Time (min): n/a METS: n/a           Dose of Adenosine (mg):  n/a Dose of Lexiscan: 0.4 mg  Dose of Atropine (mg): n/a Dose of Dobutamine: n/a mcg/kg/min (at max HR)  Stress Test Technologist: Nelson Chimes, BS-ES  Nuclear Technologist:  Doyne Keel, CNMT     Rest Procedure:  Myocardial perfusion imaging was performed at rest 45 minutes following the intravenous administration of Technetium 7m Sestamibi. Rest ECG: Normal EKG  Stress Procedure:  The patient received IV Lexiscan 0.4 mg over 15-seconds with  concurrent low level exercise and then Technetium 26m Sestamibi was injected at 30-seconds while the patient continued walking one more minute.  Quantitative spect images were obtained after a 45-minute delay.  Patient attempted Bruce Protocol but aborted test due to SOB and leg fatigue.  Patient c/o SOB with the infusion of Lexiscan.  This resolved in recovery.  Stress ECG: No significant change from baseline ECG  QPS Raw Data Images:  Normal; no motion artifact; normal heart/lung ratio. Stress Images:  Normal homogeneous uptake in all areas of the myocardium. Rest Images:  Normal homogeneous uptake in all areas of the myocardium. Subtraction (SDS):  No evidence of ischemia. Transient Ischemic Dilatation (Normal <1.22):  0.84 Lung/Heart Ratio (Normal <0.45):  0.29  Quantitative Gated Spect Images QGS EDV:  89 ml QGS ESV:  26 ml  Impression Exercise Capacity:  Lexiscan with low level exercise. BP Response:  Normal blood pressure response. Clinical Symptoms:  Patient started walking on the treadmill but had shortness of breath. The study was changed to Lexiscan ECG Impression:  No significant ST segment change suggestive of ischemia. Comparison with Prior Nuclear Study: No previous nuclear study performed  Overall Impression:  Normal stress nuclear study. there is no scar or ischemia. This is a low risk scan.  LV Ejection Fraction: 71%.  LV Wall Motion:  Normal Wall Motion.  Tinnie Gens  Myrtis Ser, MD

## 2013-09-14 ENCOUNTER — Ambulatory Visit (INDEPENDENT_AMBULATORY_CARE_PROVIDER_SITE_OTHER): Payer: Medicare Other | Admitting: Internal Medicine

## 2013-09-14 ENCOUNTER — Encounter: Payer: Self-pay | Admitting: Internal Medicine

## 2013-09-14 VITALS — BP 122/80 | HR 61 | Temp 97.8°F | Resp 20 | Ht 70.75 in | Wt 195.0 lb

## 2013-09-14 DIAGNOSIS — I2510 Atherosclerotic heart disease of native coronary artery without angina pectoris: Secondary | ICD-10-CM

## 2013-09-14 DIAGNOSIS — Z0000 Encounter for general adult medical examination without abnormal findings: Secondary | ICD-10-CM

## 2013-09-14 DIAGNOSIS — I6529 Occlusion and stenosis of unspecified carotid artery: Secondary | ICD-10-CM

## 2013-09-14 DIAGNOSIS — Z23 Encounter for immunization: Secondary | ICD-10-CM

## 2013-09-14 DIAGNOSIS — E782 Mixed hyperlipidemia: Secondary | ICD-10-CM

## 2013-09-14 LAB — COMPREHENSIVE METABOLIC PANEL
ALT: 16 U/L (ref 0–53)
AST: 19 U/L (ref 0–37)
Albumin: 3.8 g/dL (ref 3.5–5.2)
Alkaline Phosphatase: 61 U/L (ref 39–117)
BUN: 19 mg/dL (ref 6–23)
CHLORIDE: 103 meq/L (ref 96–112)
CO2: 28 meq/L (ref 19–32)
CREATININE: 0.8 mg/dL (ref 0.4–1.5)
Calcium: 8.9 mg/dL (ref 8.4–10.5)
GFR: 94.94 mL/min (ref >60.00)
Glucose, Bld: 99 mg/dL (ref 70–99)
Potassium: 4 mEq/L (ref 3.5–5.1)
Sodium: 138 mEq/L (ref 135–145)
TOTAL PROTEIN: 6.6 g/dL (ref 6.0–8.3)
Total Bilirubin: 0.6 mg/dL (ref 0.3–1.2)

## 2013-09-14 LAB — CBC WITH DIFFERENTIAL/PLATELET
BASOS ABS: 0 10*3/uL (ref 0.0–0.1)
Basophils Relative: 0.4 % (ref 0.0–3.0)
EOS ABS: 0.2 10*3/uL (ref 0.0–0.7)
Eosinophils Relative: 3 % (ref 0.0–5.0)
HCT: 39 % (ref 39.0–52.0)
HEMOGLOBIN: 13.3 g/dL (ref 13.0–17.0)
Lymphocytes Relative: 36.1 % (ref 12.0–46.0)
Lymphs Abs: 1.9 10*3/uL (ref 0.7–4.0)
MCHC: 34 g/dL (ref 30.0–36.0)
MCV: 95.9 fl (ref 78.0–100.0)
MONOS PCT: 10.3 % (ref 3.0–12.0)
Monocytes Absolute: 0.5 10*3/uL (ref 0.1–1.0)
NEUTROS PCT: 50.2 % (ref 43.0–77.0)
Neutro Abs: 2.6 10*3/uL (ref 1.4–7.7)
PLATELETS: 185 10*3/uL (ref 150.0–400.0)
RBC: 4.07 Mil/uL — ABNORMAL LOW (ref 4.22–5.81)
RDW: 13.6 % (ref 11.5–14.6)
WBC: 5.2 10*3/uL (ref 4.5–10.5)

## 2013-09-14 LAB — LIPID PANEL
CHOLESTEROL: 165 mg/dL (ref 0–200)
HDL: 39.3 mg/dL (ref >39.00)
LDL CALC: 109 mg/dL — AB (ref 0–99)
Total CHOL/HDL Ratio: 4
Triglycerides: 84 mg/dL (ref 0.0–149.0)
VLDL: 16.8 mg/dL (ref 0.0–40.0)

## 2013-09-14 LAB — TSH: TSH: 4.51 u[IU]/mL (ref 0.35–5.50)

## 2013-09-14 MED ORDER — HYDROCODONE-HOMATROPINE 5-1.5 MG/5ML PO SYRP: 5.0000 mL | ORAL_SOLUTION | Freq: Four times a day (QID) | ORAL | Status: AC | PRN

## 2013-09-14 MED ORDER — ATORVASTATIN CALCIUM 20 MG PO TABS: 20.0000 mg | ORAL_TABLET | Freq: Every day | ORAL | Status: DC

## 2013-09-14 NOTE — Patient Instructions (Addendum)
Limit your sodium (Salt) intake    It is important that you exercise regularly, at least 20 minutes 3 to 4 times per week.  If you develop chest pain or shortness of breath seek  medical attention.  Return in one year for follow-up  Acute bronchitis symptoms are generally not helped by antibiotics.  Take over-the-counter expectorants and cough medications such as  Mucinex DM.  Call if there is no improvement in 5 to 7 days or if he developed worsening cough, fever, or new symptoms, such as shortness of breath or chest pain.

## 2013-09-14 NOTE — Progress Notes (Signed)
Subjective:    Patient ID: Michael Johnston, male    DOB: 1932/05/16, 80 y.o.   MRN: [id number]  HPI 61 -year-old patient who is seen today for a wellness exam. He is followed closely by cardiology and is status post CABG in January 2012. No concerns or complaints.  He is followed by urology semiannually as well as cardiology and GI.  In March 2014 he had followup carotid artery Doppler ultrasounds and in December of 2014 had a normal nuclear stress test. His only complaint today is nonproductive cough of about one week's duration. Statin therapy discontinued by cardiology due to patient's wife's concern about worsening memory.  Here for Medicare AWV:   1. Risk factors based on Past M, S, F history:  cardiovascular risk factors include dyslipidemia and remote tobacco use. He has known coronary artery disease status post CABG 2. Physical Activities: remains quite active, plays occasional golf  3. Depression/mood: no history of depression or mood disorder  4. Hearing: no deficits  5. ADL's: independent in all aspects of daily living  6. Fall Risk: low  7. Home Safety: no problems identified  8. Height, weight, &visual acuity:height and weight are stable. No change in visual acuity  9. Counseling: heart healthy diet regular. Exercise encouraged  10. Labs ordered based on risk factors: laboratory profile will be reviewed  11. Referral Coordination- urology follow-up later this month as scheduled  12. Care Plan- heart healthy diet, exercise regimen encouraged  13. Cognitive Assessment- alert and oriented, with normal affect. No history of cognitive impairment able to handle all executive functions without difficulty  Preventive Screening-Counseling & Management   Alcohol-Tobacco  Smoking Status: remote   Allergies (verified):  No Known Drug Allergies   Past History:  Past Medical History:  Colonic polyps, hx of  Coronary artery disease Carotid artery disease  Past Surgical History:   status post partial colectomy and cholecystectomy, 1998 Dalbert Batman)  colonoscopy. 2009 Sharlett Iles)  history penile implant Rosana Hoes)   Family History:   father died age 25  mother died age 70  one sister died in her 17s due to lung cancer   Social History:    Married  Never Smoked ( no tobacco since teenage years)  6 children 69 grandchildren and 3 great-grandchildren  Smoking Status: never       Review of Systems  Constitutional: Negative for fever, chills, activity change, appetite change and fatigue.  HENT: Negative for congestion, dental problem, ear pain, hearing loss, mouth sores, rhinorrhea, sinus pressure, sneezing, tinnitus, trouble swallowing and voice change.   Eyes: Negative for photophobia, pain, redness and visual disturbance.  Respiratory: Negative for apnea, cough, choking, chest tightness, shortness of breath and wheezing.   Cardiovascular: Negative for chest pain, palpitations and leg swelling.  Gastrointestinal: Negative for nausea, vomiting, abdominal pain, diarrhea, constipation, blood in stool, abdominal distention, anal bleeding and rectal pain.  Genitourinary: Negative for dysuria, urgency, frequency, hematuria, flank pain, decreased urine volume, discharge, penile swelling, scrotal swelling, difficulty urinating, genital sores and testicular pain.  Musculoskeletal: Negative for arthralgias, back pain, gait problem, joint swelling, myalgias, neck pain and neck stiffness.  Skin: Negative for color change, rash and wound.  Neurological: Negative for dizziness, tremors, seizures, syncope, facial asymmetry, speech difficulty, weakness, light-headedness, numbness and headaches.  Hematological: Negative for adenopathy. Does not bruise/bleed easily.  Psychiatric/Behavioral: Negative for suicidal ideas, hallucinations, behavioral problems, confusion, sleep disturbance, self-injury, dysphoric mood, decreased concentration and agitation. The patient is not nervous/anxious.  Objective:   Physical Exam  Constitutional: He appears well-developed and well-nourished.  HENT:  Head: Normocephalic and atraumatic.  Right Ear: External ear normal.  Left Ear: External ear normal.  Nose: Nose normal.  Mouth/Throat: Oropharynx is clear and moist.  Eyes: Conjunctivae and EOM are normal. Pupils are equal, round, and reactive to light. No scleral icterus.  Neck: Normal range of motion. Neck supple. No JVD present. No thyromegaly present.  Cardiovascular: Regular rhythm, normal heart sounds and intact distal pulses.  Exam reveals no gallop and no friction rub.   No murmur heard. Pulmonary/Chest: Effort normal and breath sounds normal. He exhibits no tenderness.  Abdominal: Soft. Bowel sounds are normal. He exhibits no distension and no mass. There is no tenderness.  Genitourinary: Guaiac negative stool.  Status post penile implant  Musculoskeletal: Normal range of motion. He exhibits no edema and no tenderness.  Lymphadenopathy:    He has no cervical adenopathy.  Neurological: He is alert. He has normal reflexes. No cranial nerve deficit. Coordination normal.  Diminished sensation distal to the knees  Skin: Skin is warm and dry. No rash noted.  Psychiatric: He has a normal mood and affect. His behavior is normal.          Assessment & Plan:   Preventive health care. Laboratory update including lipid panel will be reviewed Coronary artery disease status post CABG. Followup cardiology Dyslipidemia. Lipid profile reviewed.  Patient is willing to rechallenge with Lipitor  URI with cough. We'll treat symptomatically   Recheck one year

## 2013-09-14 NOTE — Progress Notes (Signed)
Pre-visit discussion using our clinic review tool. No additional management support is needed unless otherwise documented below in the visit note.  

## 2013-09-28 ENCOUNTER — Telehealth: Payer: Self-pay | Admitting: Internal Medicine

## 2013-09-28 NOTE — Telephone Encounter (Signed)
Pt would like to know the results to his lab work.

## 2013-09-28 NOTE — Telephone Encounter (Signed)
Spoke to pt told him lab result were normal and cholesterol was 165 very good. Pt verbalized understanding.

## 2013-09-28 NOTE — Telephone Encounter (Signed)
Please call/notify patient that lab/test/procedure is normal Cholesterol 165

## 2013-09-28 NOTE — Telephone Encounter (Signed)
Please advise 

## 2013-11-08 ENCOUNTER — Other Ambulatory Visit: Payer: Self-pay | Admitting: Internal Medicine

## 2013-12-15 ENCOUNTER — Other Ambulatory Visit (HOSPITAL_COMMUNITY): Payer: Self-pay | Admitting: *Deleted

## 2013-12-15 DIAGNOSIS — I6529 Occlusion and stenosis of unspecified carotid artery: Secondary | ICD-10-CM

## 2013-12-27 ENCOUNTER — Ambulatory Visit (HOSPITAL_COMMUNITY): Payer: Medicare Other | Attending: Cardiology | Admitting: Cardiology

## 2013-12-27 DIAGNOSIS — I6529 Occlusion and stenosis of unspecified carotid artery: Secondary | ICD-10-CM

## 2013-12-27 NOTE — Progress Notes (Signed)
Carotid duplex performed 

## 2014-01-26 ENCOUNTER — Encounter: Payer: Self-pay | Admitting: Cardiovascular Disease

## 2014-01-26 ENCOUNTER — Ambulatory Visit (INDEPENDENT_AMBULATORY_CARE_PROVIDER_SITE_OTHER): Payer: Medicare Other | Admitting: Cardiovascular Disease

## 2014-01-26 VITALS — BP 122/64 | HR 68 | Ht 70.5 in | Wt 191.8 lb

## 2014-01-26 DIAGNOSIS — Z8249 Family history of ischemic heart disease and other diseases of the circulatory system: Secondary | ICD-10-CM

## 2014-01-26 DIAGNOSIS — E782 Mixed hyperlipidemia: Secondary | ICD-10-CM

## 2014-01-26 DIAGNOSIS — I6529 Occlusion and stenosis of unspecified carotid artery: Secondary | ICD-10-CM

## 2014-01-26 DIAGNOSIS — I2510 Atherosclerotic heart disease of native coronary artery without angina pectoris: Secondary | ICD-10-CM

## 2014-01-26 NOTE — Patient Instructions (Signed)
Your physician wants you to follow-up in: YEAR WITH DR NISHAN  You will receive a reminder letter in the mail two months in advance. If you don't receive a letter, please call our office to schedule the follow-up appointment.  Your physician recommends that you continue on your current medications as directed. Please refer to the Current Medication list given to you today. 

## 2014-01-26 NOTE — Assessment & Plan Note (Signed)
Well controlled.  Continue current medications and low sodium Dash type diet.   Normal myovue 11/14

## 2014-01-26 NOTE — Assessment & Plan Note (Signed)
Cholesterol is at goal.  Continue current dose of statin and diet Rx.  No myalgias or side effects.  F/U  LFT's in 6 months. Lab Results  Component Value Date   LDLCALC 109* 09/14/2013

## 2014-01-26 NOTE — Assessment & Plan Note (Signed)
Stable with no angina and good activity level.  Continue medical Rx  

## 2014-01-26 NOTE — Assessment & Plan Note (Signed)
Reviewed duplex  12/27/13  40-50% bilateral disease f/u duplex 4/16  Continue ASA

## 2014-01-26 NOTE — Progress Notes (Signed)
Patient ID: Michael Johnston, male   DOB: Jan 10, 1932, 80 y.o.   MRN: [id number] Michael Johnston is seen for F/U CAD SEMI after getting injections at the dentist office September 27, 2010. Subsequently found to have 3VD needing CABG:  Note the RCA was not grafted  1/12  Left internal mammary artery to the distal LAD, coronary artery a  saphenous vein graft to the first diagonal branch, and a saphenous  vein graft to the first obtuse marginal branch. He had endoscopic  saphenous vein harvest from the right thigh. He tolerated  procedure well, was taken to the surgical intensive care unit in  stable condition. Operative findings showed normal left  ventricular function, good-quality conduits, good quality targets.  Duplex in 12/04/12 reviewed showed 40-59% bilateral carotid disease. Needs lipids and liver done   09/16/12 LDL under 60 with normal LFTls   Having some chest pains. Not always with activity. Central left side ? Indigestion but no help with burbing. Lasts a minute or two  Has no nitro at home. Wife wondering if statin making memory worse  F/U myovue 11/14 normal EF 71% Stopping statin made no difference with memory so he is back on it       ROS: Denies fever, malais, weight loss, blurry vision, decreased visual acuity, cough, sputum, SOB, hemoptysis, pleuritic pain, palpitaitons, heartburn, abdominal pain, melena, lower extremity edema, claudication, or rash.  All other systems reviewed and negative  General: Affect appropriate Healthy:  appears stated age 14: normal Neck supple with no adenopathy JVP normal no bruits no thyromegaly Lungs clear with no wheezing and good diaphragmatic motion Heart:  S1/S2 no murmur, no rub, gallop or click PMI normal Abdomen: benighn, BS positve, no tenderness, no AAA no bruit.  No HSM or HJR Distal pulses intact with no bruits No edema Neuro non-focal Skin warm and dry No muscular weakness   Current Outpatient Prescriptions  Medication Sig  Dispense Refill  . aspirin 325 MG tablet Take 325 mg by mouth daily.        Marland Kitchen atorvastatin (LIPITOR) 20 MG tablet Take 1 tablet (20 mg total) by mouth daily.  90 tablet  3  . clobetasol (TEMOVATE) 0.05 % external solution Apply 1 application topically 2 (two) times daily.      Marland Kitchen ketoconazole (NIZORAL) 2 % shampoo APPLY EVERY OTHER DAY TO SCALP--LATHER 5 MINUTES,THEN RINSE OUT  120 mL  0  . metoprolol tartrate (LOPRESSOR) 25 MG tablet TAKE 1 TABLET BY MOUTH 2 TIMES A DAY  60 tablet  5  . nitroGLYCERIN (NITROSTAT) 0.4 MG SL tablet Place 1 tablet (0.4 mg total) under the tongue every 5 (five) minutes as needed for chest pain.  25 tablet  4   No current facility-administered medications for this visit.    Allergies  Review of patient's allergies indicates no known allergies.  Electrocardiogram:  SR rate 54 normal   Assessment and Plan

## 2014-02-10 ENCOUNTER — Other Ambulatory Visit: Payer: Self-pay | Admitting: Internal Medicine

## 2014-03-05 ENCOUNTER — Other Ambulatory Visit: Payer: Self-pay | Admitting: Internal Medicine

## 2014-05-19 ENCOUNTER — Encounter (INDEPENDENT_AMBULATORY_CARE_PROVIDER_SITE_OTHER): Payer: Medicare Other | Admitting: Ophthalmology

## 2014-05-19 DIAGNOSIS — H43819 Vitreous degeneration, unspecified eye: Secondary | ICD-10-CM

## 2014-05-19 DIAGNOSIS — H2510 Age-related nuclear cataract, unspecified eye: Secondary | ICD-10-CM

## 2014-05-19 DIAGNOSIS — H3530 Unspecified macular degeneration: Secondary | ICD-10-CM

## 2014-06-25 ENCOUNTER — Other Ambulatory Visit: Payer: Self-pay | Admitting: Internal Medicine

## 2014-07-14 ENCOUNTER — Other Ambulatory Visit: Payer: Self-pay | Admitting: Physician Assistant

## 2014-07-14 ENCOUNTER — Telehealth: Payer: Self-pay | Admitting: Internal Medicine

## 2014-07-14 MED ORDER — ATORVASTATIN CALCIUM 20 MG PO TABS: 20.0000 mg | ORAL_TABLET | Freq: Every day | ORAL | Status: DC

## 2014-07-14 NOTE — Telephone Encounter (Signed)
[id number] - [place], [place] [address] is requesting 90 day re-fill on metoprolol tartrate (LOPRESSOR) 25 MG tablet

## 2014-07-14 NOTE — Telephone Encounter (Signed)
Rx sent 

## 2014-08-02 ENCOUNTER — Ambulatory Visit (INDEPENDENT_AMBULATORY_CARE_PROVIDER_SITE_OTHER): Payer: Medicare Other | Admitting: Internal Medicine

## 2014-08-02 ENCOUNTER — Encounter: Payer: Self-pay | Admitting: Internal Medicine

## 2014-08-02 VITALS — BP 120/76 | HR 70 | Temp 97.6°F | Wt 197.0 lb

## 2014-08-02 DIAGNOSIS — E782 Mixed hyperlipidemia: Secondary | ICD-10-CM

## 2014-08-02 DIAGNOSIS — R1013 Epigastric pain: Secondary | ICD-10-CM

## 2014-08-02 DIAGNOSIS — I2583 Coronary atherosclerosis due to lipid rich plaque: Principal | ICD-10-CM

## 2014-08-02 DIAGNOSIS — I2510 Atherosclerotic heart disease of native coronary artery without angina pectoris: Secondary | ICD-10-CM

## 2014-08-02 LAB — COMPREHENSIVE METABOLIC PANEL
ALK PHOS: 71 U/L (ref 39–117)
ALT: 22 U/L (ref 0–53)
AST: 26 U/L (ref 0–37)
Albumin: 4.2 g/dL (ref 3.5–5.2)
BUN: 22 mg/dL (ref 6–23)
CO2: 31 mEq/L (ref 19–32)
Calcium: 9.5 mg/dL (ref 8.4–10.5)
Chloride: 102 mEq/L (ref 96–112)
Creatinine, Ser: 0.8 mg/dL (ref 0.4–1.5)
GFR: 97.43 mL/min (ref >60.00)
Glucose, Bld: 125 mg/dL — ABNORMAL HIGH (ref 70–99)
POTASSIUM: 5 meq/L (ref 3.5–5.1)
SODIUM: 142 meq/L (ref 135–145)
TOTAL PROTEIN: 6.8 g/dL (ref 6.0–8.3)
Total Bilirubin: 0.5 mg/dL (ref 0.2–1.2)

## 2014-08-02 LAB — CBC WITH DIFFERENTIAL/PLATELET
Basophils Absolute: 0 10*3/uL (ref 0.0–0.1)
Basophils Relative: 0.5 % (ref 0.0–3.0)
EOS PCT: 3.1 % (ref 0.0–5.0)
Eosinophils Absolute: 0.2 10*3/uL (ref 0.0–0.7)
HEMATOCRIT: 41.1 % (ref 39.0–52.0)
Hemoglobin: 13.5 g/dL (ref 13.0–17.0)
LYMPHS ABS: 2.2 10*3/uL (ref 0.7–4.0)
Lymphocytes Relative: 36.8 % (ref 12.0–46.0)
MCHC: 32.9 g/dL (ref 30.0–36.0)
MCV: 98.2 fl (ref 78.0–100.0)
Monocytes Absolute: 0.5 10*3/uL (ref 0.1–1.0)
Monocytes Relative: 8.9 % (ref 3.0–12.0)
Neutro Abs: 3.1 10*3/uL (ref 1.4–7.7)
Neutrophils Relative %: 50.7 % (ref 43.0–77.0)
Platelets: 191 10*3/uL (ref 150.0–400.0)
RBC: 4.19 Mil/uL — ABNORMAL LOW (ref 4.22–5.81)
RDW: 13.6 % (ref 11.5–15.5)
WBC: 6.1 10*3/uL (ref 4.0–10.5)

## 2014-08-02 MED ORDER — OMEPRAZOLE 40 MG PO CPDR: 40.0000 mg | DELAYED_RELEASE_CAPSULE | Freq: Every day | ORAL | Status: DC

## 2014-08-02 NOTE — Progress Notes (Signed)
Subjective:    Patient ID: Michael Johnston, male    DOB: 19-Feb-1932, 80 y.o.   MRN: [id number]  HPI Wt Readings from Last 3 Encounters:  08/02/14 197 lb (89.359 kg)  01/26/14 191 lb 12.8 oz (87 kg)  09/14/13 195 lb (88.49 kg)   80 year old patient who presents with a 4 week history of epigastric discomfort.  He describes this as mild indigestion and is usually worsened postprandially.  There is been no nausea, vomiting, and he states that he has maintained his appetite.  There has been no weight loss.  Last colonoscopy 2010.  He has been using Rolaids without much benefit.  Symptoms are described as mild but persistent.  Indigestion.  Has never lasted as long as 4 weeks.  He has cut his caffeine load without benefit.  No significant nocturnal symptoms.  No back pain.  He does have a history of prior hiatal hernia.  Past Medical History  Diagnosis Date  . COLONIC POLYPS, HX OF   . Mixed hyperlipidemia   . CHEST PAIN   . HIATAL HERNIA, HX OF     History   Social History  . Marital Status: Married    Spouse Name: N/A    Number of Children: N/A  . Years of Education: N/A   Occupational History  . Not on file.   Social History Main Topics  . Smoking status: Former Research scientist (life sciences)  . Smokeless tobacco: Never Used  . Alcohol Use: No  . Drug Use: No  . Sexual Activity: Not on file   Other Topics Concern  . Not on file   Social History Narrative    Past Surgical History  Procedure Laterality Date  . Coronary artery bypass graft      Median sternotomy for coronary artery bypass grafting x3  (left internal mammary artery to distal left anterior  descending  coronary artery, saphenous vein graft to first diagonal branch,  saphenous vein graft to first  obtuse marginal branch, endoscopic  saphenous vein harvest from right thigh). SURGEON:  Valentina Gu.  Roxy Manns, MD  ASSISTANT:  John Giovanni, PA-C  ANESTHESIA:  Glynda Jaeger, MD   . Status post partial colectomy and cholecystectomy,  1998 ([facility])      Family History  Problem Relation Age of Onset  . Lung cancer Sister     No Known Allergies  Current Outpatient Prescriptions on File Prior to Visit  Medication Sig Dispense Refill  . aspirin 325 MG tablet Take 325 mg by mouth daily.      Marland Kitchen atorvastatin (LIPITOR) 20 MG tablet Take 1 tablet (20 mg total) by mouth daily. 90 tablet 1  . clobetasol (TEMOVATE) 0.05 % external solution Apply 1 application topically 2 (two) times daily.    Marland Kitchen ketoconazole (NIZORAL) 2 % shampoo APPLY EVERY OTHER DAY TO SCALP--LATHER 5 MINUTES,THEN RINSE OUT 120 mL 0  . metoprolol tartrate (LOPRESSOR) 25 MG tablet TAKE 1 TABLET BY MOUTH 2 TIMES A DAY 60 tablet 5  . nitroGLYCERIN (NITROSTAT) 0.4 MG SL tablet Place 1 tablet (0.4 mg total) under the tongue every 5 (five) minutes as needed for chest pain. 25 tablet 4   No current facility-administered medications on file prior to visit.    BP 120/76 mmHg  Pulse 70  Temp(Src) 97.6 F (36.4 C) (Oral)  Wt 197 lb (89.359 kg)  SpO2 98%     Review of Systems  Constitutional: Negative for fever, chills, appetite change and fatigue.  HENT: Negative for  congestion, dental problem, ear pain, hearing loss, sore throat, tinnitus, trouble swallowing and voice change.   Eyes: Negative for pain, discharge and visual disturbance.  Respiratory: Negative for cough, chest tightness, wheezing and stridor.   Cardiovascular: Negative for chest pain, palpitations and leg swelling.  Gastrointestinal: Positive for abdominal pain and constipation. Negative for nausea, vomiting, diarrhea, blood in stool and abdominal distention.  Genitourinary: Negative for urgency, hematuria, flank pain, discharge, difficulty urinating and genital sores.  Musculoskeletal: Negative for myalgias, back pain, joint swelling, arthralgias, gait problem and neck stiffness.  Skin: Negative for rash.  Neurological: Negative for dizziness, syncope, speech difficulty, weakness, numbness and  headaches.  Hematological: Negative for adenopathy. Does not bruise/bleed easily.  Psychiatric/Behavioral: Negative for behavioral problems and dysphoric mood. The patient is not nervous/anxious.        Objective:   Physical Exam  Constitutional: He is oriented to person, place, and time. He appears well-developed.  Appears well No distress Mildly overweight  HENT:  Head: Normocephalic.  Right Ear: External ear normal.  Left Ear: External ear normal.  Eyes: Conjunctivae and EOM are normal.  Neck: Normal range of motion.  Cardiovascular: Normal rate and normal heart sounds.   Pulmonary/Chest: Effort normal and breath sounds normal.  Abdominal: Soft. Bowel sounds are normal. He exhibits no distension and no mass. There is no tenderness. There is no rebound and no guarding.  Musculoskeletal: Normal range of motion. He exhibits no edema or tenderness.  Neurological: He is alert and oriented to person, place, and time.  Psychiatric: He has a normal mood and affect. His behavior is normal.          Assessment & Plan:   Nonspecific epigastric pain.  We'll place on an aggressive antireflux diet and PPI therapy.  We'll check screening lab.  Recheck 4 weeks.  Will notify if any worsening pain, anorexia, weight loss or change in bowel habits Coronary artery disease Carotid artery disease History dyslipidemia

## 2014-08-02 NOTE — Patient Instructions (Signed)
Abdominal Pain Many things can cause abdominal pain. Usually, abdominal pain is not caused by a disease and will improve without treatment. It can often be observed and treated at home. Your health care provider will do a physical exam and possibly order blood tests and X-rays to help determine the seriousness of your pain. However, in many cases, more time must pass before a clear cause of the pain can be found. Before that point, your health care provider may not know if you need more testing or further treatment. HOME CARE INSTRUCTIONS  Monitor your abdominal pain for any changes. The following actions may help to alleviate any discomfort you are experiencing:  Only take over-the-counter or prescription medicines as directed by your health care provider.  Do not take laxatives unless directed to do so by your health care provider.  Try a clear liquid diet (broth, tea, or water) as directed by your health care provider. Slowly move to a bland diet as tolerated. SEEK MEDICAL CARE IF:  You have unexplained abdominal pain.  You have abdominal pain associated with nausea or diarrhea.  You have pain when you urinate or have a bowel movement.  You experience abdominal pain that wakes you in the night.  You have abdominal pain that is worsened or improved by eating food.  You have abdominal pain that is worsened with eating fatty foods.  You have a fever. SEEK IMMEDIATE MEDICAL CARE IF:   Your pain does not go away within 2 hours.  You keep throwing up (vomiting).  Your pain is felt only in portions of the abdomen, such as the right side or the left lower portion of the abdomen.  You pass bloody or black tarry stools. MAKE SURE YOU:  Understand these instructions.   Will watch your condition.   Will get help right away if you are not doing well or get worse.  Document Released: 06/05/2005 Document Revised: 08/31/2013 Document Reviewed: 05/05/2013 [facility] Patient Information  2015 [place], [place]. This information is not intended to replace advice given to you by your health care provider. Make sure you discuss any questions you have with your health care provider. Food Choices for Gastroesophageal Reflux Disease When you have gastroesophageal reflux disease (GERD), the foods you eat and your eating habits are very important. Choosing the right foods can help ease the discomfort of GERD. WHAT GENERAL GUIDELINES DO I NEED TO FOLLOW?  Choose fruits, vegetables, whole grains, low-fat dairy products, and low-fat meat, fish, and poultry.  Limit fats such as oils, salad dressings, butter, nuts, and avocado.  Keep a food diary to identify foods that cause symptoms.  Avoid foods that cause reflux. These may be different for different people.  Eat frequent small meals instead of three large meals each day.  Eat your meals slowly, in a relaxed setting.  Limit fried foods.  Cook foods using methods other than frying.  Avoid drinking alcohol.  Avoid drinking large amounts of liquids with your meals.  Avoid bending over or lying down until 2-3 hours after eating. WHAT FOODS ARE NOT RECOMMENDED? The following are some foods and drinks that may worsen your symptoms: Vegetables Tomatoes. Tomato juice. Tomato and spaghetti sauce. Chili peppers. Onion and garlic. Horseradish. Fruits Oranges, grapefruit, and lemon (fruit and juice). Meats High-fat meats, fish, and poultry. This includes hot dogs, ribs, ham, sausage, salami, and bacon. Dairy Whole milk and chocolate milk. Sour cream. Cream. Butter. Ice cream. Cream cheese.  Beverages Coffee and tea, with or without  caffeine. Carbonated beverages or energy drinks. Condiments Hot sauce. Barbecue sauce.  Sweets/Desserts Chocolate and cocoa. Donuts. Peppermint and spearmint. Fats and Oils High-fat foods, including [place] fries and potato chips. Other Vinegar. Strong spices, such as black pepper, white pepper, red  pepper, cayenne, curry powder, cloves, ginger, and chili powder. The items listed above may not be a complete list of foods and beverages to avoid. Contact your dietitian for more information. Document Released: 08/26/2005 Document Revised: 08/31/2013 Document Reviewed: 06/30/2013 [facility] Patient Information 2015 [place], [place]. This information is not intended to replace advice given to you by your health care provider. Make sure you discuss any questions you have with your health care provider.

## 2014-08-02 NOTE — Progress Notes (Signed)
Pre visit review using our clinic review tool, if applicable. No additional management support is needed unless otherwise documented below in the visit note. 

## 2014-08-18 ENCOUNTER — Other Ambulatory Visit: Payer: Self-pay | Admitting: Internal Medicine

## 2014-08-31 ENCOUNTER — Ambulatory Visit (INDEPENDENT_AMBULATORY_CARE_PROVIDER_SITE_OTHER): Payer: Medicare Other | Admitting: Internal Medicine

## 2014-08-31 ENCOUNTER — Encounter: Payer: Self-pay | Admitting: Internal Medicine

## 2014-08-31 VITALS — BP 124/68 | HR 72 | Temp 98.0°F | Resp 20 | Ht 70.5 in | Wt 194.0 lb

## 2014-08-31 DIAGNOSIS — K219 Gastro-esophageal reflux disease without esophagitis: Secondary | ICD-10-CM

## 2014-08-31 DIAGNOSIS — I2510 Atherosclerotic heart disease of native coronary artery without angina pectoris: Secondary | ICD-10-CM

## 2014-08-31 NOTE — Progress Notes (Signed)
Pre visit review using our clinic review tool, if applicable. No additional management support is needed unless otherwise documented below in the visit note. 

## 2014-08-31 NOTE — Patient Instructions (Addendum)
Limit your sodium (Salt) intake    It is important that you exercise regularly, at least 20 minutes 3 to 4 times per week.  If you develop chest pain or shortness of breath seek  medical attention.  Return in one year for follow-up  Call or return to clinic prn if these symptoms worsen or fail to improve as anticipated.

## 2014-08-31 NOTE — Progress Notes (Signed)
Subjective:    Patient ID: Michael Johnston, male    DOB: 1932-04-24, 80 y.o.   MRN: [id number]  HPI  80 year old patient who is seen today in follow-up.  He was seen recently with epigastric pain and placed on aggressive antireflux regimen.  Pain has resolved.  He generally feels quite well today.  He does have a history of gastroesophageal reflux disease. He is scheduled to see cardiology in the spring and also has a urology follow-up His only complaint today is right shoulder pain.  He fell while on his knees sustaining trauma to the right shoulder 1 week ago.  He has had persistent pain, but good range of motion  Past Medical History  Diagnosis Date  . COLONIC POLYPS, HX OF   . Mixed hyperlipidemia   . CHEST PAIN   . HIATAL HERNIA, HX OF     History   Social History  . Marital Status: Married    Spouse Name: N/A    Number of Children: N/A  . Years of Education: N/A   Occupational History  . Not on file.   Social History Main Topics  . Smoking status: Former Research scientist (life sciences)  . Smokeless tobacco: Never Used  . Alcohol Use: No  . Drug Use: No  . Sexual Activity: Not on file   Other Topics Concern  . Not on file   Social History Narrative    Past Surgical History  Procedure Laterality Date  . Coronary artery bypass graft      Median sternotomy for coronary artery bypass grafting x3  (left internal mammary artery to distal left anterior  descending  coronary artery, saphenous vein graft to first diagonal branch,  saphenous vein graft to first  obtuse marginal branch, endoscopic  saphenous vein harvest from right thigh). SURGEON:  Valentina Gu.  Roxy Manns, MD  ASSISTANT:  John Giovanni, PA-C  ANESTHESIA:  Glynda Jaeger, MD   . Status post partial colectomy and cholecystectomy, 1998 (ingram)      Family History  Problem Relation Age of Onset  . Lung cancer Sister     No Known Allergies  Current Outpatient Prescriptions on File Prior to Visit  Medication Sig Dispense Refill   . aspirin 325 MG tablet Take 325 mg by mouth daily.      Marland Kitchen atorvastatin (LIPITOR) 20 MG tablet Take 1 tablet (20 mg total) by mouth daily. 90 tablet 1  . ketoconazole (NIZORAL) 2 % shampoo APPLY EVERY OTHER DAY TO SCALP--LATHER 5 MINUTES,THEN RINSE OUT 120 mL 0  . metoprolol tartrate (LOPRESSOR) 25 MG tablet TAKE 1 TABLET BY MOUTH 2 TIMES A DAY 60 tablet 5  . nitroGLYCERIN (NITROSTAT) 0.4 MG SL tablet Place 1 tablet (0.4 mg total) under the tongue every 5 (five) minutes as needed for chest pain. 25 tablet 4  . omeprazole (PRILOSEC) 40 MG capsule Take 1 capsule (40 mg total) by mouth daily. 30 capsule 3   No current facility-administered medications on file prior to visit.    BP 124/68 mmHg  Pulse 72  Temp(Src) 98 F (36.7 C) (Oral)  Resp 20  Ht 5' 10.5" (1.791 m)  Wt 194 lb (87.998 kg)  BMI 27.43 kg/m2  SpO2 98%     Review of Systems  Constitutional: Negative for fever, chills, appetite change and fatigue.  HENT: Negative for congestion, dental problem, ear pain, hearing loss, sore throat, tinnitus, trouble swallowing and voice change.   Eyes: Negative for pain, discharge and visual disturbance.  Respiratory:  Negative for cough, chest tightness, wheezing and stridor.   Cardiovascular: Negative for chest pain, palpitations and leg swelling.  Gastrointestinal: Negative for nausea, vomiting, abdominal pain, diarrhea, constipation, blood in stool and abdominal distention.  Genitourinary: Negative for urgency, hematuria, flank pain, discharge, difficulty urinating and genital sores.  Musculoskeletal: Negative for myalgias, back pain, joint swelling, arthralgias, gait problem and neck stiffness.       Traumatic right shoulder pain of one week's duration  Skin: Negative for rash.  Neurological: Negative for dizziness, syncope, speech difficulty, weakness, numbness and headaches.  Hematological: Negative for adenopathy. Does not bruise/bleed easily.  Psychiatric/Behavioral: Negative for  behavioral problems and dysphoric mood. The patient is not nervous/anxious.        Objective:   Physical Exam  Constitutional: He is oriented to person, place, and time. He appears well-developed.  HENT:  Head: Normocephalic.  Right Ear: External ear normal.  Left Ear: External ear normal.  Eyes: Conjunctivae and EOM are normal.  Neck: Normal range of motion.  Cardiovascular: Normal rate and normal heart sounds.   Pulmonary/Chest: Breath sounds normal.  Abdominal: Bowel sounds are normal.  Musculoskeletal: Normal range of motion. He exhibits no edema or tenderness.  Full range of motion right shoulder No signs of active inflammation or ecchymoses  Neurological: He is alert and oriented to person, place, and time.  Psychiatric: He has a normal mood and affect. His behavior is normal.          Assessment & Plan:   Epigastric pain, resolved Coronary artery disease, stable Traumatic right shoulder pain.  Seems to be improving and range of motion is intact.  Will observe at this time.  Will call for orthopedic referral if remains symptomatic  CPX 12 months Cardiology and urology follow-up as scheduled

## 2014-09-16 ENCOUNTER — Encounter: Payer: Self-pay | Admitting: Family Medicine

## 2014-09-16 ENCOUNTER — Ambulatory Visit (INDEPENDENT_AMBULATORY_CARE_PROVIDER_SITE_OTHER): Payer: Medicare Other | Admitting: Family Medicine

## 2014-09-16 VITALS — BP 130/78 | HR 70 | Temp 98.3°F | Wt 195.0 lb

## 2014-09-16 DIAGNOSIS — R05 Cough: Secondary | ICD-10-CM | POA: Diagnosis not present

## 2014-09-16 DIAGNOSIS — R059 Cough, unspecified: Secondary | ICD-10-CM

## 2014-09-16 MED ORDER — LEVOFLOXACIN 500 MG PO TABS: 500.0000 mg | ORAL_TABLET | Freq: Every day | ORAL | Status: DC

## 2014-09-16 NOTE — Progress Notes (Signed)
   Subjective:    Patient ID: Michael Johnston, male    DOB: 08-19-1932, 81 y.o.   MRN: [id number]  HPI   Acute visit. Patient seen with 4 day history of cough which has been productive of green sputum. His wife has had similar symptoms. He is not aware of any fevers but has had some chills off and on. Increased malaise. Some nasal congestion. Cough severe at night. He was apparently written prescription for Hycodan cough syrup few days ago which helps minimally. Denies any nausea or vomiting. He has prior history of CAD with bypass several years ago.  Past Medical History  Diagnosis Date  . COLONIC POLYPS, HX OF   . Mixed hyperlipidemia   . CHEST PAIN   . HIATAL HERNIA, HX OF    Past Surgical History  Procedure Laterality Date  . Coronary artery bypass graft      Median sternotomy for coronary artery bypass grafting x3  (left internal mammary artery to distal left anterior  descending  coronary artery, saphenous vein graft to first diagonal branch,  saphenous vein graft to first  obtuse marginal branch, endoscopic  saphenous vein harvest from right thigh). SURGEON:  Valentina Gu.  Roxy Manns, MD  ASSISTANT:  John Giovanni, PA-C  ANESTHESIA:  Glynda Jaeger, MD   . Status post partial colectomy and cholecystectomy, 1998 (ingram)      reports that he has quit smoking. He has never used smokeless tobacco. He reports that he does not drink alcohol or use illicit drugs. family history includes Lung cancer in his sister. No Known Allergies    Review of Systems  Constitutional: Positive for chills and fatigue. Negative for fever.  HENT: Positive for congestion.   Respiratory: Positive for cough. Negative for shortness of breath and wheezing.        Objective:   Physical Exam  Constitutional: He appears well-developed and well-nourished.  HENT:  Right Ear: External ear normal.  Left Ear: External ear normal.  Mouth/Throat: Oropharynx is clear and moist.  Neck: Neck supple.    Cardiovascular: Normal rate and regular rhythm.   Pulmonary/Chest: Effort normal and breath sounds normal.  Has a few crackles left base, otherwise clear  Lymphadenopathy:    He has no cervical adenopathy.          Assessment & Plan:  Cough. We explained this may be viral but given slight asymmetric breath sounds above low with his age and underlying comorbidities will go ahead and start Levaquin 500 mg once daily for one week. He does not have any hypoxemia or dehydration or other predictors of high risk

## 2014-09-16 NOTE — Progress Notes (Signed)
Pre visit review using our clinic review tool, if applicable. No additional management support is needed unless otherwise documented below in the visit note. 

## 2014-09-16 NOTE — Patient Instructions (Signed)
Follow up for any fever or increased shortness of breath. 

## 2014-09-17 ENCOUNTER — Telehealth: Payer: Self-pay | Admitting: *Deleted

## 2014-09-17 ENCOUNTER — Telehealth: Payer: Self-pay | Admitting: Internal Medicine

## 2014-09-17 MED ORDER — AMOXICILLIN-POT CLAVULANATE 875-125 MG PO TABS: 1.0000 | ORAL_TABLET | Freq: Two times a day (BID) | ORAL | Status: DC

## 2014-09-17 MED ORDER — AZITHROMYCIN 250 MG PO TABS: ORAL_TABLET | ORAL | Status: DC

## 2014-09-17 NOTE — Telephone Encounter (Signed)
Spoke to pt, told him Dr. Yong Channel changed antibiotics to Amoxicillin and Azithromycin Rx's were sent to [facility], stop Levaquin. Pt verbalized understanding

## 2014-09-17 NOTE — Telephone Encounter (Signed)
Received call from Amy with [facility], pt was seen yesterday by Dr.Burchette and Dx with Pneumonia and was started on Levaquin after taking first does pt's eyes became swollen. Pt requesting new antibiotic. Please advise

## 2014-09-17 NOTE — Telephone Encounter (Signed)
Stop levaquin. With comorbidity, change to augmentin and azithromycin.

## 2014-09-19 NOTE — Telephone Encounter (Signed)
PLEASE NOTE: All timestamps contained within this report are represented as [place] Standard Time. CONFIDENTIALTY NOTICE: This fax transmission is intended only for the addressee. It contains information that is legally privileged, confidential or otherwise protected from use or disclosure. If you are not the intended recipient, you are strictly prohibited from reviewing, disclosing, copying using or disseminating any of this information or taking any action in reliance on or regarding this information. If you have received this fax in error, please notify us immediately by telephone so that we can arrange for its return to [place]. Phone: [phone], Toll-Free: [phone], Fax: [phone] Page: 1 of 2 Call Id: [id number] [facility] Night - Client [place] Patient Name: Michael Johnston Gender: Male DOB: 05-24-1932 Age: 81 Y 2 M 19 D Return Phone Number: [phone] (Primary), [phone] (Secondary) Address: [place]/State/Zip: [place] [address] Client [facility] Night - Client Client Site [facility] - Night Physician Simonne Martinet Contact Type Call Call Type Triage / Clinical Relationship To Patient Self Return Phone Number [phone] (Primary) Chief Complaint Cough Initial Comment Caller states saw DR Burchette yesterday; wrote Rx for meds-500 mg Levofloxacin; took one yesterday and caused eyes to swell and have discharge; needs a different med; cough, MD heard something in left lung; PreDisposition Did not know what to do Nurse Assessment Nurse: Mechele Dawley, RN, Amy Date/Time (Eastern Time): 09/17/2014 9:18:07 AM Confirm and document reason for call. If symptomatic, describe symptoms. ---CALLER STATES THAT HE WAS SEEN YESTERDAY EYE LIDS SWELL, RED UNDER THE EYE AND FLUID OUT OF THE EYES THAT WAS CLEAR AFTERWARDS. HE TOOK THE MEDICATION AROUND 1300 -  LEVAQUIN. MD HAD TOLD HIM THAT HE WAS ON THE VERGE OF PNEUMONIA. HE IS REQUESTING A NEW MEDICATION TO BE CALLED IN. HE HAS A VERY PRODUCTIVE COUGH. NO FEVER. Has the patient traveled out of the country within the last 30 days? ---Not Applicable Does the patient require triage? ---Yes Related visit to physician within the last 2 weeks? ---Yes Does the PT have any chronic conditions? (i.e. diabetes, asthma, etc.) ---Yes List chronic conditions. ---BYPASS SURGERY 5 YEARS AGO Guidelines Guideline Title Affirmed Question Affirmed Notes Nurse Date/Time (Eastern Time) Eye - Swelling [1] SEVERE eyelid swelling (i.e., shut or almost) AND [2] involves both eyes (Exception: itchy eyes, which are probably an allergic reaction) Mechele Dawley, RN, Amy 09/17/2014 9:20:48 AM PLEASE NOTE: All timestamps contained within this report are represented as [place] Standard Time. CONFIDENTIALTY NOTICE: This fax transmission is intended only for the addressee. It contains information that is legally privileged, confidential or otherwise protected from use or disclosure. If you are not the intended recipient, you are strictly prohibited from reviewing, disclosing, copying using or disseminating any of this information or taking any action in reliance on or regarding this information. If you have received this fax in error, please notify us immediately by telephone so that we can arrange for its return to [place]. Phone: [phone], Toll-Free: [phone], Fax: [phone] Page: 2 of 2 Call Id: [id number] [place]. Time Eilene Ghazi Time) Disposition Final User 09/17/2014 9:44:33 AM Paged On Call back to [facility], [place], Amy Reason: CALLED ON CALL PER PRIMARY MOBILE 09/17/2014 9:44:47 AM Clinical Call Mechele Dawley, RN, Amy 09/17/2014 9:29:20 AM See Physician within 4 Hours (or PCP triage) Yes Mechele Dawley, RN, Amy Caller Understands: Yes Disagree/Comply: Comply Care Advice Given Per Guideline SEE PHYSICIAN WITHIN 4 HOURS (or PCP  triage): CALL BACK IF: * You  become worse. CARE ADVICE given per Eye - Swelling (Adult) guideline. After Care Instructions Given Call Event Type User Date / Time Description Comments User: Susanne Borders, RN Date/Time Eilene Ghazi Time): 09/17/2014 9:44:00 AM CALLED THE CLINIC AND SPOKE WITH SOMEONE TAKING THE CALLS FOR DR. HUNTER. SHE TOOK DOWN THE INFORMATION AND WILL TAKE CARE OF THIS. TRIAGE NURSE CALLED AND INFORMED THE CALLER. WILL CLOSE THIS CALL. Referrals GO TO FACILITY UNDECIDED

## 2014-10-18 ENCOUNTER — Encounter: Payer: Self-pay | Admitting: Internal Medicine

## 2014-10-18 ENCOUNTER — Ambulatory Visit (INDEPENDENT_AMBULATORY_CARE_PROVIDER_SITE_OTHER): Payer: Medicare Other | Admitting: Internal Medicine

## 2014-10-18 VITALS — BP 130/80 | HR 59 | Temp 98.1°F | Resp 20 | Ht 70.75 in | Wt 197.0 lb

## 2014-10-18 DIAGNOSIS — Z0000 Encounter for general adult medical examination without abnormal findings: Secondary | ICD-10-CM | POA: Diagnosis not present

## 2014-10-18 DIAGNOSIS — I6529 Occlusion and stenosis of unspecified carotid artery: Secondary | ICD-10-CM

## 2014-10-18 DIAGNOSIS — I2510 Atherosclerotic heart disease of native coronary artery without angina pectoris: Secondary | ICD-10-CM | POA: Diagnosis not present

## 2014-10-18 DIAGNOSIS — E782 Mixed hyperlipidemia: Secondary | ICD-10-CM | POA: Diagnosis not present

## 2014-10-18 DIAGNOSIS — Z86010 Personal history of colonic polyps: Secondary | ICD-10-CM

## 2014-10-18 LAB — LIPID PANEL
CHOL/HDL RATIO: 2
CHOLESTEROL: 126 mg/dL (ref 0–200)
HDL: 55.8 mg/dL (ref >39.00)
LDL Cholesterol: 51 mg/dL (ref 0–99)
NonHDL: 70.2
TRIGLYCERIDES: 98 mg/dL (ref 0.0–149.0)
VLDL: 19.6 mg/dL (ref 0.0–40.0)

## 2014-10-18 LAB — TSH: TSH: 3.83 u[IU]/mL (ref 0.35–4.50)

## 2014-10-18 NOTE — Progress Notes (Signed)
Subjective:    Patient ID: Michael Johnston, male    DOB: March 05, 1932, 81 y.o.   MRN: [id number]  HPI 77 -year-old patient who is seen today for a wellness exam.  He is followed closely by cardiology and is status post CABG in January 2012. No concerns or complaints.  He is followed by urology semiannually as well as cardiology and GI.  In 4/15  he had followup carotid artery Doppler ultrasounds and in December of 2014 had a normal nuclear stress test. His only complaint today is nonproductive cough of about one week's duration. Statin therapy discontinued by cardiology due to patient's wife's concern about worsening memory.  He was challenged on Lipitor, which he continues to tolerate well. Here for Medicare AWV:   1. Risk factors based on Past M, S, F history:  cardiovascular risk factors include dyslipidemia and remote tobacco use. He has known coronary artery disease status post CABG 2. Physical Activities: remains quite active, plays occasional golf  3. Depression/mood: no history of depression or mood disorder  4. Hearing: no deficits  5. ADL's: independent in all aspects of daily living  6. Fall Risk: low  7. Home Safety: no problems identified  8. Height, weight, &visual acuity:height and weight are stable. No change in visual acuity  9. Counseling: heart healthy diet regular. Exercise encouraged  10. Labs ordered based on risk factors: laboratory profile will be reviewed  11. Referral Coordination- urology follow-up later this month as scheduled  12. Care Plan- heart healthy diet, exercise regimen encouraged  13. Cognitive Assessment- alert and oriented, with normal affect. No history of cognitive impairment able to handle all executive functions without difficulty  14.  Preventive services will include annual clinical examination was screening lab.  He is seen by urology and cardiology at least annually.  Patient was provided with a written and personalized care plan 15.  Provider  list update includes primary care cardiology GI urology    Preventive Screening-Counseling & Management   Alcohol-Tobacco  Smoking Status: remote   Allergies (verified):  No Known Drug Allergies   Past History:  Past Medical History:  Colonic polyps, hx of  Coronary artery disease Carotid artery disease  Past Surgical History:  status post partial colectomy and cholecystectomy, 1998 Dalbert Batman)  colonoscopy. 2009 Sharlett Iles)  history penile implant Rosana Hoes)   Family History:   father died age 39  mother died age 12  one sister died in her 17s due to lung cancer   Social History:    Married  Never Smoked ( no tobacco since teenage years)  6 children 41 grandchildren and 3 great-grandchildren  Smoking Status: never       Review of Systems  Constitutional: Negative for fever, chills, activity change, appetite change and fatigue.  HENT: Negative for congestion, dental problem, ear pain, hearing loss, mouth sores, rhinorrhea, sinus pressure, sneezing, tinnitus, trouble swallowing and voice change.   Eyes: Negative for photophobia, pain, redness and visual disturbance.  Respiratory: Negative for apnea, cough, choking, chest tightness, shortness of breath and wheezing.   Cardiovascular: Negative for chest pain, palpitations and leg swelling.  Gastrointestinal: Negative for nausea, vomiting, abdominal pain, diarrhea, constipation, blood in stool, abdominal distention, anal bleeding and rectal pain.  Genitourinary: Negative for dysuria, urgency, frequency, hematuria, flank pain, decreased urine volume, discharge, penile swelling, scrotal swelling, difficulty urinating, genital sores and testicular pain.  Musculoskeletal: Negative for myalgias, back pain, joint swelling, arthralgias, gait problem, neck pain and neck stiffness.  Skin: Negative for  color change, rash and wound.  Neurological: Negative for dizziness, tremors, seizures, syncope, facial asymmetry, speech difficulty,  weakness, light-headedness, numbness and headaches.  Hematological: Negative for adenopathy. Does not bruise/bleed easily.  Psychiatric/Behavioral: Negative for suicidal ideas, hallucinations, behavioral problems, confusion, sleep disturbance, self-injury, dysphoric mood, decreased concentration and agitation. The patient is not nervous/anxious.        Objective:   Physical Exam  Constitutional: He appears well-developed and well-nourished.  HENT:  Head: Normocephalic and atraumatic.  Right Ear: External ear normal.  Left Ear: External ear normal.  Nose: Nose normal.  Mouth/Throat: Oropharynx is clear and moist.  Eyes: Conjunctivae and EOM are normal. Pupils are equal, round, and reactive to light. No scleral icterus.  Neck: Normal range of motion. Neck supple. No JVD present. No thyromegaly present.  Cardiovascular: Regular rhythm, normal heart sounds and intact distal pulses.  Exam reveals no gallop and no friction rub.   No murmur heard. Pulmonary/Chest: Effort normal and breath sounds normal. He exhibits no tenderness.  Abdominal: Soft. Bowel sounds are normal. He exhibits no distension and no mass. There is no tenderness.  Genitourinary: Guaiac negative stool.  Status post penile implant  Musculoskeletal: Normal range of motion. He exhibits no edema or tenderness.  Lymphadenopathy:    He has no cervical adenopathy.  Neurological: He is alert. He has normal reflexes. No cranial nerve deficit. Coordination normal.  Diminished sensation distal to the knees  Skin: Skin is warm and dry. No rash noted.  Psychiatric: He has a normal mood and affect. His behavior is normal.          Assessment & Plan:   Preventive health care. Laboratory update including lipid panel will be reviewed Coronary artery disease status post CABG. Followup cardiology Dyslipidemia. Lipid profile reviewed.   will continue atorvastatin  Follow-up urology and cardiology    Recheck one year

## 2014-10-18 NOTE — Patient Instructions (Addendum)
Limit your sodium (Salt) intake    It is important that you exercise regularly, at least 20 minutes 3 to 4 times per week.  If you develop chest pain or shortness of breath seek  medical attention.  Return in 6 months for follow-up Health Maintenance A healthy lifestyle and preventative care can promote health and wellness.  Maintain regular health, dental, and eye exams.  Eat a healthy diet. Foods like vegetables, fruits, whole grains, low-fat dairy products, and lean protein foods contain the nutrients you need and are low in calories. Decrease your intake of foods high in solid fats, added sugars, and salt. Get information about a proper diet from your health care provider, if necessary.  Regular physical exercise is one of the most important things you can do for your health. Most adults should get at least 150 minutes of moderate-intensity exercise (any activity that increases your heart rate and causes you to sweat) each week. In addition, most adults need muscle-strengthening exercises on 2 or more days a week.   Maintain a healthy weight. The body mass index (BMI) is a screening tool to identify possible weight problems. It provides an estimate of body fat based on height and weight. Your health care provider can find your BMI and can help you achieve or maintain a healthy weight. For males 20 years and older:  A BMI below 18.5 is considered underweight.  A BMI of 18.5 to 24.9 is normal.  A BMI of 25 to 29.9 is considered overweight.  A BMI of 30 and above is considered obese.  Maintain normal blood lipids and cholesterol by exercising and minimizing your intake of saturated fat. Eat a balanced diet with plenty of fruits and vegetables. Blood tests for lipids and cholesterol should begin at age 20 and be repeated every 5 years. If your lipid or cholesterol levels are high, you are over age 50, or you are at high risk for heart disease, you may need your cholesterol levels checked  more frequently.Ongoing high lipid and cholesterol levels should be treated with medicines if diet and exercise are not working.  If you smoke, find out from your health care provider how to quit. If you do not use tobacco, do not start.  Lung cancer screening is recommended for adults aged 55-80 years who are at high risk for developing lung cancer because of a history of smoking. A yearly low-dose CT scan of the lungs is recommended for people who have at least a 30-pack-year history of smoking and are current smokers or have quit within the past 15 years. A pack year of smoking is smoking an average of 1 pack of cigarettes a day for 1 year (for example, a 30-pack-year history of smoking could mean smoking 1 pack a day for 30 years or 2 packs a day for 15 years). Yearly screening should continue until the smoker has stopped smoking for at least 15 years. Yearly screening should be stopped for people who develop a health problem that would prevent them from having lung cancer treatment.  If you choose to drink alcohol, do not have more than 2 drinks per day. One drink is considered to be 12 oz (360 mL) of beer, 5 oz (150 mL) of wine, or 1.5 oz (45 mL) of liquor.  Avoid the use of street drugs. Do not share needles with anyone. Ask for help if you need support or instructions about stopping the use of drugs.  High blood pressure causes heart disease   and increases the risk of stroke. Blood pressure should be checked at least every 1-2 years. Ongoing high blood pressure should be treated with medicines if weight loss and exercise are not effective.  If you are 45-79 years old, ask your health care provider if you should take aspirin to prevent heart disease.  Diabetes screening involves taking a blood sample to check your fasting blood sugar level. This should be done once every 3 years after age 45 if you are at a normal weight and without risk factors for diabetes. Testing should be considered at a  younger age or be carried out more frequently if you are overweight and have at least 1 risk factor for diabetes.  Colorectal cancer can be detected and often prevented. Most routine colorectal cancer screening begins at the age of 50 and continues through age 75. However, your health care provider may recommend screening at an earlier age if you have risk factors for colon cancer. On a yearly basis, your health care provider may provide home test kits to check for hidden blood in the stool. A small camera at the end of a tube may be used to directly examine the colon (sigmoidoscopy or colonoscopy) to detect the earliest forms of colorectal cancer. Talk to your health care provider about this at age 50 when routine screening begins. A direct exam of the colon should be repeated every 5-10 years through age 75, unless early forms of precancerous polyps or small growths are found.  People who are at an increased risk for hepatitis B should be screened for this virus. You are considered at high risk for hepatitis B if:  You were born in a country where hepatitis B occurs often. Talk with your health care provider about which countries are considered high risk.  Your parents were born in a high-risk country and you have not received a shot to protect against hepatitis B (hepatitis B vaccine).  You have HIV or AIDS.  You use needles to inject street drugs.  You live with, or have sex with, someone who has hepatitis B.  You are a man who has sex with other men (MSM).  You get hemodialysis treatment.  You take certain medicines for conditions like cancer, organ transplantation, and autoimmune conditions.  Hepatitis C blood testing is recommended for all people born from 1945 through 1965 and any individual with known risk factors for hepatitis C.  Healthy men should no longer receive prostate-specific antigen (PSA) blood tests as part of routine cancer screening. Talk to your health care provider  about prostate cancer screening.  Testicular cancer screening is not recommended for adolescents or adult males who have no symptoms. Screening includes self-exam, a health care provider exam, and other screening tests. Consult with your health care provider about any symptoms you have or any concerns you have about testicular cancer.  Practice safe sex. Use condoms and avoid high-risk sexual practices to reduce the spread of sexually transmitted infections (STIs).  You should be screened for STIs, including gonorrhea and chlamydia if:  You are sexually active and are younger than 24 years.  You are older than 24 years, and your health care provider tells you that you are at risk for this type of infection.  Your sexual activity has changed since you were last screened, and you are at an increased risk for chlamydia or gonorrhea. Ask your health care provider if you are at risk.  If you are at risk of being infected with   HIV, it is recommended that you take a prescription medicine daily to prevent HIV infection. This is called pre-exposure prophylaxis (PrEP). You are considered at risk if:  You are a man who has sex with other men (MSM).  You are a heterosexual man who is sexually active with multiple partners.  You take drugs by injection.  You are sexually active with a partner who has HIV.  Talk with your health care provider about whether you are at high risk of being infected with HIV. If you choose to begin PrEP, you should first be tested for HIV. You should then be tested every 3 months for as long as you are taking PrEP.  Use sunscreen. Apply sunscreen liberally and repeatedly throughout the day. You should seek shade when your shadow is shorter than you. Protect yourself by wearing long sleeves, pants, a wide-brimmed hat, and sunglasses year round whenever you are outdoors.  Tell your health care provider of new moles or changes in moles, especially if there is a change in shape  or color. Also, tell your health care provider if a mole is larger than the size of a pencil eraser.  A one-time screening for abdominal aortic aneurysm (AAA) and surgical repair of large AAAs by ultrasound is recommended for men aged 65-75 years who are current or former smokers.  Stay current with your vaccines (immunizations). Document Released: 02/22/2008 Document Revised: 08/31/2013 Document Reviewed: 01/21/2011 Exit[employer] Patient Information 2015 [employer]. This information is not intended to replace advice given to you by your health care provider. Make sure you discuss any questions you have with your health care provider.  

## 2014-10-18 NOTE — Progress Notes (Signed)
Pre visit review using our clinic review tool, if applicable. No additional management support is needed unless otherwise documented below in the visit note. 

## 2014-11-10 DIAGNOSIS — M79676 Pain in unspecified toe(s): Secondary | ICD-10-CM | POA: Diagnosis not present

## 2014-11-10 DIAGNOSIS — B351 Tinea unguium: Secondary | ICD-10-CM | POA: Diagnosis not present

## 2014-12-12 DIAGNOSIS — N401 Enlarged prostate with lower urinary tract symptoms: Secondary | ICD-10-CM

## 2014-12-12 DIAGNOSIS — N138 Other obstructive and reflux uropathy: Secondary | ICD-10-CM | POA: Insufficient documentation

## 2014-12-12 DIAGNOSIS — N528 Other male erectile dysfunction: Secondary | ICD-10-CM | POA: Diagnosis not present

## 2015-01-11 ENCOUNTER — Other Ambulatory Visit (HOSPITAL_COMMUNITY): Payer: Self-pay | Admitting: Physician Assistant

## 2015-01-11 DIAGNOSIS — L814 Other melanin hyperpigmentation: Secondary | ICD-10-CM | POA: Diagnosis not present

## 2015-01-11 DIAGNOSIS — L981 Factitial dermatitis: Secondary | ICD-10-CM | POA: Diagnosis not present

## 2015-01-11 DIAGNOSIS — D485 Neoplasm of uncertain behavior of skin: Secondary | ICD-10-CM | POA: Diagnosis not present

## 2015-01-11 DIAGNOSIS — L570 Actinic keratosis: Secondary | ICD-10-CM | POA: Diagnosis not present

## 2015-01-17 ENCOUNTER — Other Ambulatory Visit: Payer: Self-pay | Admitting: Internal Medicine

## 2015-01-25 NOTE — Progress Notes (Signed)
Patient ID: Michael Johnston, male   DOB: [birth year], 81 y.o.   MRN: [id number]   Michael Johnston is seen for F/U CAD SEMI after getting injections at the dentist office September 27, 2010. Subsequently found to have 3VD needing CABG:  Note the RCA was not grafted   1/12  CABG Left internal mammary artery to the distal LAD, coronary artery a  saphenous vein graft to the first diagonal branch, and a saphenous  vein graft to the first obtuse marginal branch. He had endoscopic  saphenous vein harvest from the right thigh. He tolerated  procedure well, was taken to the surgical intensive care unit in  stable condition. Operative findings showed normal left  ventricular function, good-quality conduits, good quality targets.   Duplex in 12/27/13  reviewed showed 40-59% bilateral carotid disease. Needs f/u duplex for known moderate diseaes   10/18/14  LDL 51  with normal LFTls   Having some chest pains. Not always with activity. Central left side ? Indigestion but no help with burbing. Lasts a minute or two  Has no nitro at home. Wife wondering if statin making memory worse  F/U myovue 11/14 normal EF 71% Stopping statin made no difference with memory so he is back on it  Some balance issues but very active   ROS: Denies fever, malais, weight loss, blurry vision, decreased visual acuity, cough, sputum, SOB, hemoptysis, pleuritic pain, palpitaitons, heartburn, abdominal pain, melena, lower extremity edema, claudication, or rash.  All other systems reviewed and negative  General: Affect appropriate Healthy:  appears stated age 81: normal Neck supple with no adenopathy JVP normal no bruits no thyromegaly Lungs clear with no wheezing and good diaphragmatic motion Heart:  S1/S2 no murmur, no rub, gallop or click PMI normal Abdomen: benighn, BS positve, no tenderness, no AAA no bruit.  No HSM or HJR Distal pulses intact with no bruits No edema Neuro non-focal Skin warm and dry No muscular  weakness   Current Outpatient Prescriptions  Medication Sig Dispense Refill  . aspirin 325 MG tablet Take 325 mg by mouth daily.      Marland Kitchen atorvastatin (LIPITOR) 20 MG tablet Take 1 tablet (20 mg total) by mouth daily. 90 tablet 1  . ketoconazole (NIZORAL) 2 % shampoo APPLY EVERY OTHER DAY TO SCALP--LATHER 5 MINUTES,THEN RINSE OUT 120 mL 5  . metoprolol tartrate (LOPRESSOR) 25 MG tablet TAKE 1 TABLET BY MOUTH 2 TIMES A DAY 60 tablet 5  . nitroGLYCERIN (NITROSTAT) 0.4 MG SL tablet Place 1 tablet (0.4 mg total) under the tongue every 5 (five) minutes as needed for chest pain. 25 tablet 4   No current facility-administered medications for this visit.    Allergies  Levaquin  Electrocardiogram:   01/26/14  SR rate 54 normal  01/26/15  SR rate 64  LAD LVH no significant change   Assessment and Plan CAD:  CABG 2012  Active with no angina continue medical Rx HTN:  Well controlled.  Continue current medications and low sodium Dash type diet.   Chol:   Cholesterol is at goal.  Continue current dose of statin and diet Rx.  No myalgias or side effects.  F/U  LFT's in 6 months. Lab Results  Component Value Date   LDLCALC 51 10/18/2014            F/U with me in 6 months

## 2015-01-26 ENCOUNTER — Ambulatory Visit (INDEPENDENT_AMBULATORY_CARE_PROVIDER_SITE_OTHER): Payer: Medicare Other | Admitting: Cardiovascular Disease

## 2015-01-26 ENCOUNTER — Encounter: Payer: Self-pay | Admitting: Cardiovascular Disease

## 2015-01-26 VITALS — BP 120/65 | HR 64 | Ht 70.75 in | Wt 193.0 lb

## 2015-01-26 DIAGNOSIS — I2510 Atherosclerotic heart disease of native coronary artery without angina pectoris: Secondary | ICD-10-CM | POA: Diagnosis not present

## 2015-01-26 DIAGNOSIS — I2583 Coronary atherosclerosis due to lipid rich plaque: Principal | ICD-10-CM

## 2015-01-26 NOTE — Patient Instructions (Signed)
Medication Instructions:  NO CHANGES  Labwork: NONE  Testing/Procedures: NONE  Follow-Up: Your physician wants you to follow-up in: 6 MONTHS WITH DR NISHAN You will receive a reminder letter in the mail two months in advance. If you don't receive a letter, please call our office to schedule the follow-up appointment.  Any Other Special Instructions Will Be Listed Below (If Applicable).   

## 2015-02-02 ENCOUNTER — Encounter: Payer: Self-pay | Admitting: Gastroenterology

## 2015-02-09 DIAGNOSIS — B351 Tinea unguium: Secondary | ICD-10-CM | POA: Diagnosis not present

## 2015-02-09 DIAGNOSIS — M79676 Pain in unspecified toe(s): Secondary | ICD-10-CM | POA: Diagnosis not present

## 2015-02-22 ENCOUNTER — Other Ambulatory Visit: Payer: Self-pay | Admitting: Internal Medicine

## 2015-03-07 DIAGNOSIS — L570 Actinic keratosis: Secondary | ICD-10-CM | POA: Diagnosis not present

## 2015-03-07 DIAGNOSIS — D485 Neoplasm of uncertain behavior of skin: Secondary | ICD-10-CM | POA: Diagnosis not present

## 2015-03-07 DIAGNOSIS — C4442 Squamous cell carcinoma of skin of scalp and neck: Secondary | ICD-10-CM | POA: Diagnosis not present

## 2015-03-24 ENCOUNTER — Other Ambulatory Visit: Payer: Self-pay | Admitting: Internal Medicine

## 2015-05-11 ENCOUNTER — Other Ambulatory Visit: Payer: Self-pay | Admitting: Physician Assistant

## 2015-05-11 DIAGNOSIS — L570 Actinic keratosis: Secondary | ICD-10-CM | POA: Diagnosis not present

## 2015-05-11 DIAGNOSIS — D485 Neoplasm of uncertain behavior of skin: Secondary | ICD-10-CM | POA: Diagnosis not present

## 2015-05-11 DIAGNOSIS — C4442 Squamous cell carcinoma of skin of scalp and neck: Secondary | ICD-10-CM | POA: Diagnosis not present

## 2015-05-18 ENCOUNTER — Encounter: Payer: Self-pay | Admitting: Internal Medicine

## 2015-05-18 ENCOUNTER — Ambulatory Visit (INDEPENDENT_AMBULATORY_CARE_PROVIDER_SITE_OTHER): Payer: Medicare Other | Admitting: Internal Medicine

## 2015-05-18 VITALS — BP 130/80 | HR 69 | Temp 98.1°F | Ht 70.75 in | Wt 195.0 lb

## 2015-05-18 DIAGNOSIS — H6121 Impacted cerumen, right ear: Secondary | ICD-10-CM

## 2015-05-18 DIAGNOSIS — I2510 Atherosclerotic heart disease of native coronary artery without angina pectoris: Secondary | ICD-10-CM

## 2015-05-18 NOTE — Progress Notes (Signed)
Subjective:    Patient ID: Michael Johnston, male    DOB: [birth year], 81 y.o.   MRN: [id number]  HPI 81 year old patient who has stable coronary artery disease.  He presents today with a one-week history of decreased auditory acuity involving the right ear.  Denies any cardiac symptoms.  Denies any focal neurological symptoms.  Only concern is some early URI symptoms  Past Medical History  Diagnosis Date  . COLONIC POLYPS, HX OF   . Mixed hyperlipidemia   . CHEST PAIN   . HIATAL HERNIA, HX OF     Social History   Social History  . Marital Status: Married    Spouse Name: N/A  . Number of Children: N/A  . Years of Education: N/A   Occupational History  . Not on file.   Social History Main Topics  . Smoking status: Former Research scientist (life sciences)  . Smokeless tobacco: Never Used  . Alcohol Use: No  . Drug Use: No  . Sexual Activity: Not on file   Other Topics Concern  . Not on file   Social History Narrative    Past Surgical History  Procedure Laterality Date  . Coronary artery bypass graft      Median sternotomy for coronary artery bypass grafting x3  (left internal mammary artery to distal left anterior  descending  coronary artery, saphenous vein graft to first diagonal branch,  saphenous vein graft to first  obtuse marginal branch, endoscopic  saphenous vein harvest from right thigh). SURGEON:  Valentina Gu.  Roxy Manns, MD  ASSISTANT:  John Giovanni, PA-C  ANESTHESIA:  Glynda Jaeger, MD   . Status post partial colectomy and cholecystectomy, 1998 (ingram)      Family History  Problem Relation Age of Onset  . Lung cancer Sister   . Heart attack Father     Allergies  Allergen Reactions  . Levaquin [Levofloxacin In D5w] Swelling    Eyes swollen    Current Outpatient Prescriptions on File Prior to Visit  Medication Sig Dispense Refill  . aspirin 325 MG tablet Take 325 mg by mouth daily.      Marland Kitchen atorvastatin (LIPITOR) 20 MG tablet TAKE 1 TABLET (20 MG TOTAL) BY MOUTH DAILY. 90  tablet 1  . ketoconazole (NIZORAL) 2 % shampoo APPLY EVERY OTHER DAY TO SCALP--LATHER 5 MINUTES,THEN RINSE OUT 120 mL 5  . metoprolol tartrate (LOPRESSOR) 25 MG tablet TAKE 1 TABLET BY MOUTH 2 TIMES A DAY 60 tablet 5  . nitroGLYCERIN (NITROSTAT) 0.4 MG SL tablet Place 1 tablet (0.4 mg total) under the tongue every 5 (five) minutes as needed for chest pain. 25 tablet 4   No current facility-administered medications on file prior to visit.    BP 130/80 mmHg  Pulse 69  Temp(Src) 98.1 F (36.7 C) (Oral)  Ht 5' 10.75" (1.797 m)  Wt 195 lb (88.451 kg)  BMI 27.39 kg/m2  SpO2 97%      Review of Systems  Constitutional: Negative for fever, chills, appetite change and fatigue.  HENT: Positive for congestion, hearing loss and sinus pressure. Negative for dental problem, ear pain, sore throat, tinnitus, trouble swallowing and voice change.   Eyes: Negative for pain, discharge and visual disturbance.  Respiratory: Negative for cough, chest tightness, wheezing and stridor.   Cardiovascular: Negative for chest pain, palpitations and leg swelling.  Gastrointestinal: Negative for nausea, vomiting, abdominal pain, diarrhea, constipation, blood in stool and abdominal distention.  Genitourinary: Negative for urgency, hematuria, flank pain, discharge, difficulty urinating  and genital sores.  Musculoskeletal: Negative for myalgias, back pain, joint swelling, arthralgias, gait problem and neck stiffness.  Skin: Negative for rash.  Neurological: Negative for dizziness, syncope, speech difficulty, weakness, numbness and headaches.  Hematological: Negative for adenopathy. Does not bruise/bleed easily.  Psychiatric/Behavioral: Negative for behavioral problems and dysphoric mood. The patient is not nervous/anxious.        Objective:   Physical Exam  Constitutional: He appears well-developed and well-nourished.  HENT:  Head: Normocephalic and atraumatic.  Right Ear: External ear normal.  Left Ear:  External ear normal.  Nose: Nose normal.  Mouth/Throat: Oropharynx is clear and moist.  Minimal cerumen left canal Significant cerumen noted.  Right canal.  Right canal irrigated until clear  Eyes: Conjunctivae and EOM are normal. Pupils are equal, round, and reactive to light. No scleral icterus.  Neck: Normal range of motion. Neck supple. No JVD present. No thyromegaly present.  Cardiovascular: Regular rhythm, normal heart sounds and intact distal pulses.  Exam reveals no gallop and no friction rub.   No murmur heard. Pulmonary/Chest: Effort normal and breath sounds normal. No respiratory distress. He has no wheezes. He exhibits no tenderness.  Abdominal: Soft. Bowel sounds are normal. He exhibits no distension and no mass. There is no tenderness.  Genitourinary: Prostate normal and penis normal.  Musculoskeletal: Normal range of motion. He exhibits no edema or tenderness.  Lymphadenopathy:    He has no cervical adenopathy.  Neurological: He is alert. He has normal reflexes. No cranial nerve deficit. Coordination normal.  Skin: Skin is warm and dry. No rash noted.  Psychiatric: He has a normal mood and affect. His behavior is normal.          Assessment & Plan:    Cerumen impaction, right ear.  Right canal irrigated until clear Stable coronary artery disease.  Follow-up cardiology  CPX 6 months

## 2015-05-18 NOTE — Progress Notes (Signed)
Patient received education resource, including the self-management goal and tool. Patient verbalized understanding. 

## 2015-05-18 NOTE — Patient Instructions (Signed)
Return in 6 months for follow-up  Cerumen Impaction A cerumen impaction is when the wax in your ear forms a plug. This plug usually causes reduced hearing. Sometimes it also causes an earache or dizziness. Removing a cerumen impaction can be difficult and painful. The wax sticks to the ear canal. The canal is sensitive and bleeds easily. If you try to remove a heavy wax buildup with a cotton tipped swab, you may push it in further. Irrigation with water, suction, and small ear curettes may be used to clear out the wax. If the impaction is fixed to the skin in the ear canal, ear drops may be needed for a few days to loosen the wax. People who build up a lot of wax frequently can use ear wax removal products available in your local drugstore. SEEK MEDICAL CARE IF:  You develop an earache, increased hearing loss, or marked dizziness. Document Released: 10/03/2004 Document Revised: 11/18/2011 Document Reviewed: 11/23/2009 Exit[employer] Patient Information 2015 [employer]. This information is not intended to replace advice given to you by your health care provider. Make sure you discuss any questions you have with your health care provider.  

## 2015-05-24 ENCOUNTER — Encounter (HOSPITAL_COMMUNITY): Payer: Self-pay | Admitting: Emergency Medicine

## 2015-05-24 ENCOUNTER — Emergency Department (HOSPITAL_COMMUNITY): Payer: Medicare Other

## 2015-05-24 ENCOUNTER — Observation Stay (HOSPITAL_COMMUNITY)
Admission: EM | Admit: 2015-05-24 | Discharge: 2015-05-25 | Disposition: A | Discharge status: Home / Self Care | Payer: Medicare Other | Attending: Internal Medicine | Admitting: Internal Medicine

## 2015-05-24 ENCOUNTER — Observation Stay (HOSPITAL_COMMUNITY): Payer: Medicare Other

## 2015-05-24 DIAGNOSIS — I2510 Atherosclerotic heart disease of native coronary artery without angina pectoris: Secondary | ICD-10-CM | POA: Insufficient documentation

## 2015-05-24 DIAGNOSIS — Z87891 Personal history of nicotine dependence: Secondary | ICD-10-CM | POA: Insufficient documentation

## 2015-05-24 DIAGNOSIS — E782 Mixed hyperlipidemia: Secondary | ICD-10-CM | POA: Insufficient documentation

## 2015-05-24 DIAGNOSIS — R262 Difficulty in walking, not elsewhere classified: Secondary | ICD-10-CM | POA: Insufficient documentation

## 2015-05-24 DIAGNOSIS — I252 Old myocardial infarction: Secondary | ICD-10-CM | POA: Diagnosis not present

## 2015-05-24 DIAGNOSIS — Z7982 Long term (current) use of aspirin: Secondary | ICD-10-CM | POA: Diagnosis not present

## 2015-05-24 DIAGNOSIS — J449 Chronic obstructive pulmonary disease, unspecified: Secondary | ICD-10-CM | POA: Insufficient documentation

## 2015-05-24 DIAGNOSIS — I779 Disorder of arteries and arterioles, unspecified: Secondary | ICD-10-CM | POA: Diagnosis present

## 2015-05-24 DIAGNOSIS — M1990 Unspecified osteoarthritis, unspecified site: Secondary | ICD-10-CM | POA: Insufficient documentation

## 2015-05-24 DIAGNOSIS — G4730 Sleep apnea, unspecified: Secondary | ICD-10-CM | POA: Insufficient documentation

## 2015-05-24 DIAGNOSIS — Z79899 Other long term (current) drug therapy: Secondary | ICD-10-CM | POA: Insufficient documentation

## 2015-05-24 DIAGNOSIS — Z85828 Personal history of other malignant neoplasm of skin: Secondary | ICD-10-CM | POA: Insufficient documentation

## 2015-05-24 DIAGNOSIS — Z951 Presence of aortocoronary bypass graft: Secondary | ICD-10-CM | POA: Insufficient documentation

## 2015-05-24 DIAGNOSIS — T63441A Toxic effect of venom of bees, accidental (unintentional), initial encounter: Principal | ICD-10-CM | POA: Insufficient documentation

## 2015-05-24 DIAGNOSIS — I739 Peripheral vascular disease, unspecified: Secondary | ICD-10-CM

## 2015-05-24 DIAGNOSIS — R55 Syncope and collapse: Secondary | ICD-10-CM | POA: Insufficient documentation

## 2015-05-24 DIAGNOSIS — T63461A Toxic effect of venom of wasps, accidental (unintentional), initial encounter: Secondary | ICD-10-CM | POA: Diagnosis present

## 2015-05-24 DIAGNOSIS — R208 Other disturbances of skin sensation: Secondary | ICD-10-CM

## 2015-05-24 DIAGNOSIS — R40243 Glasgow coma scale score 3-8: Secondary | ICD-10-CM | POA: Diagnosis not present

## 2015-05-24 DIAGNOSIS — I959 Hypotension, unspecified: Secondary | ICD-10-CM | POA: Diagnosis not present

## 2015-05-24 DIAGNOSIS — K219 Gastro-esophageal reflux disease without esophagitis: Secondary | ICD-10-CM | POA: Diagnosis not present

## 2015-05-24 DIAGNOSIS — Z86010 Personal history of colonic polyps: Secondary | ICD-10-CM | POA: Diagnosis not present

## 2015-05-24 HISTORY — DX: Unspecified chronic bronchitis: J42

## 2015-05-24 HISTORY — DX: Unspecified osteoarthritis, unspecified site: M19.90

## 2015-05-24 HISTORY — DX: Sleep apnea, unspecified: G47.30

## 2015-05-24 HISTORY — DX: Cystic kidney disease, unspecified: Q61.9

## 2015-05-24 HISTORY — DX: Chronic obstructive pulmonary disease, unspecified: J44.9

## 2015-05-24 HISTORY — DX: Syncope and collapse: R55

## 2015-05-24 HISTORY — DX: Atherosclerotic heart disease of native coronary artery without angina pectoris: I25.10

## 2015-05-24 HISTORY — DX: Unspecified malignant neoplasm of skin, unspecified: C44.90

## 2015-05-24 HISTORY — DX: Gastro-esophageal reflux disease without esophagitis: K21.9

## 2015-05-24 HISTORY — DX: Personal history of other diseases of the digestive system: Z87.19

## 2015-05-24 HISTORY — DX: Acute myocardial infarction, unspecified: I21.9

## 2015-05-24 LAB — BASIC METABOLIC PANEL
Anion gap: 8 (ref 5–15)
BUN: 21 mg/dL — AB (ref 6–20)
CO2: 26 mmol/L (ref 22–32)
CREATININE: 0.86 mg/dL (ref 0.61–1.24)
Calcium: 9 mg/dL (ref 8.9–10.3)
Chloride: 104 mmol/L (ref 101–111)
GFR calc Af Amer: >60 mL/min (ref >60)
Glucose, Bld: 182 mg/dL — ABNORMAL HIGH (ref 65–99)
POTASSIUM: 4.3 mmol/L (ref 3.5–5.1)
SODIUM: 138 mmol/L (ref 135–145)

## 2015-05-24 LAB — I-STAT TROPONIN, ED: TROPONIN I, POC: 0 ng/mL (ref 0.00–0.08)

## 2015-05-24 LAB — CBC
HCT: 39.8 % (ref 39.0–52.0)
Hemoglobin: 13.5 g/dL (ref 13.0–17.0)
MCH: 32.6 pg (ref 26.0–34.0)
MCHC: 33.9 g/dL (ref 30.0–36.0)
MCV: 96.1 fL (ref 78.0–100.0)
Platelets: 133 10*3/uL — ABNORMAL LOW (ref 150–400)
RBC: 4.14 MIL/uL — ABNORMAL LOW (ref 4.22–5.81)
RDW: 13 % (ref 11.5–15.5)
WBC: 10.2 10*3/uL (ref 4.0–10.5)

## 2015-05-24 LAB — URINALYSIS, ROUTINE W REFLEX MICROSCOPIC
Bilirubin Urine: NEGATIVE
Glucose, UA: NEGATIVE mg/dL
Hgb urine dipstick: NEGATIVE
Ketones, ur: NEGATIVE mg/dL
Leukocytes, UA: NEGATIVE
Nitrite: NEGATIVE
Protein, ur: NEGATIVE mg/dL
Specific Gravity, Urine: 1.016 (ref 1.005–1.030)
Urobilinogen, UA: 0.2 mg/dL (ref 0.0–1.0)
pH: 6 (ref 5.0–8.0)

## 2015-05-24 LAB — TROPONIN I: TROPONIN I: 0.04 ng/mL — AB (ref <0.031)

## 2015-05-24 LAB — TSH: TSH: 0.965 u[IU]/mL (ref 0.350–4.500)

## 2015-05-24 MED ORDER — SODIUM CHLORIDE 0.9 % IV BOLUS (SEPSIS)
500.0000 mL | Freq: Once | INTRAVENOUS | Status: AC
  Administered 2015-05-24: 500 mL via INTRAVENOUS

## 2015-05-24 MED ORDER — ACETAMINOPHEN 325 MG PO TABS
650.0000 mg | ORAL_TABLET | Freq: Four times a day (QID) | ORAL | Status: DC | PRN
Start: 2015-05-24 — End: 2015-05-25

## 2015-05-24 MED ORDER — ACETAMINOPHEN 650 MG RE SUPP: 650.0000 mg | Freq: Four times a day (QID) | RECTAL | Status: DC | PRN

## 2015-05-24 MED ORDER — ALUM & MAG HYDROXIDE-SIMETH 200-200-20 MG/5ML PO SUSP: 30.0000 mL | Freq: Four times a day (QID) | ORAL | Status: DC | PRN

## 2015-05-24 MED ORDER — ONDANSETRON HCL 4 MG PO TABS: 4.0000 mg | ORAL_TABLET | Freq: Four times a day (QID) | ORAL | Status: DC | PRN

## 2015-05-24 MED ORDER — METOPROLOL TARTRATE 25 MG PO TABS
25.0000 mg | ORAL_TABLET | Freq: Two times a day (BID) | ORAL | Status: DC
  Administered 2015-05-25: 25 mg via ORAL
  Filled 2015-05-24: qty 1

## 2015-05-24 MED ORDER — ATORVASTATIN CALCIUM 20 MG PO TABS
20.0000 mg | ORAL_TABLET | Freq: Every day | ORAL | Status: DC
  Administered 2015-05-24: 20 mg via ORAL
  Filled 2015-05-24: qty 1

## 2015-05-24 MED ORDER — ONDANSETRON HCL 4 MG/2ML IJ SOLN: 4.0000 mg | Freq: Four times a day (QID) | INTRAMUSCULAR | Status: DC | PRN

## 2015-05-24 MED ORDER — SODIUM CHLORIDE 0.9 % IV SOLN
INTRAVENOUS | Status: AC
  Administered 2015-05-24: 16:00:00 via INTRAVENOUS

## 2015-05-24 MED ORDER — ENOXAPARIN SODIUM 40 MG/0.4ML ~~LOC~~ SOLN
40.0000 mg | SUBCUTANEOUS | Status: DC
  Administered 2015-05-24: 40 mg via SUBCUTANEOUS
  Filled 2015-05-24: qty 0.4

## 2015-05-24 MED ORDER — POLYETHYLENE GLYCOL 3350 17 G PO PACK: 17.0000 g | PACK | Freq: Every day | ORAL | Status: DC | PRN

## 2015-05-24 MED ORDER — ASPIRIN 325 MG PO TABS
325.0000 mg | ORAL_TABLET | Freq: Every day | ORAL | Status: DC
  Administered 2015-05-24 – 2015-05-25 (×2): 325 mg via ORAL
  Filled 2015-05-24 (×2): qty 1

## 2015-05-24 MED ORDER — NITROGLYCERIN 0.4 MG SL SUBL: 0.4000 mg | SUBLINGUAL_TABLET | SUBLINGUAL | Status: DC | PRN

## 2015-05-24 MED ORDER — SODIUM CHLORIDE 0.9 % IJ SOLN
3.0000 mL | Freq: Two times a day (BID) | INTRAMUSCULAR | Status: DC
  Administered 2015-05-24 – 2015-05-25 (×2): 3 mL via INTRAVENOUS

## 2015-05-24 NOTE — ED Notes (Signed)
Pt went to the dentist, received filling. Son states he had a heart attack after a last dentist appt.

## 2015-05-24 NOTE — ED Notes (Addendum)
To ED from home via [facility]  With c/o syncopal episode after wasp sting. No history of allergic reactions, but daughter used an epi pen on pt, no response to epi. On EMS arrival to house, pt was unresponsive, pale, initial BP -- 80/53, received 300 cc IV NS bp 130/80. On arrival to ED pt is alert./ oriented x 4, w/d.

## 2015-05-24 NOTE — H&P (Signed)
[facility] History and Physical                                                                                    Michael Johnston, is a 81 y.o. male  MRN: [id number]   DOB - 09/29/1931  Admit Date - 05/24/2015  Outpatient Primary MD for the patient is Nyoka Cowden, MD  Referring Physician:  Dr. Canary Brim  Chief Complaint:   Chief Complaint  Patient presents with  . Loss of Consciousness  . 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 He presents to the emergency room today with a syncopal episode. The patient and his family provides history. The patient fasted yesterday. He does that on Tuesdays as part of his religious practice. This morning he got up eighth and toast and went outside to pick okra. He was stung by a wasp. He came inside washed his face, suddenly felt dizzy and went to the couch to lay down. His family said he was nonresponsive for 30 minutes. They were unable to wake him. Could not feel a pulse. An said he was breathing 2 times a minute.  His daughter injected him with epinephrine which she says caused him to breathe a little bit more quickly. EMS came and found his blood pressure to be 80/53. He was brought to the emergency department and has responded well to IV fluids. His blood pressure is now normal. The patient reports that he had a syncopal event approximately 2 years ago after a bee sting. But he denies any rash, shortness of breath, feeling of his throat closing, or tongue swelling. He has had multiple bee stings in between the 2 that did not make him pass out. His family is concerned about a "deep" squamous cell skin cancer he has on his head that was recently removed. In Epic notice that he is due to have his carotid Dopplers updated as he had 50% left-sided stenosis in 2015.    In the emergency department labs are essentially  normal with the exception of a BUN that is 21 and CBG that is 182. His point-of-care troponin is 0. His EKG is unchanged from previous. His family is very concerned. We will admit him for observation on telemetry overnight  Review of Systems   In addition to the HPI above,  No Fever-chills, No Headache, No changes with Vision or hearing,.  Specifically he denies seeing black spots, double vision, or blurry vision. No problems swallowing food or Liquids, No Chest pain, Cough or Shortness of Breath, No Abdominal pain, No Nausea or Vomiting, Bowel movements are regular, No Blood in stool or Urine, No dysuria, No new skin rashes or bruises, No new joints pains-aches,  No new weakness, tingling, numbness in any extremity, No recent weight gain or loss, A full 10 point Review of Systems was done, except as stated above, all other Review of Systems were negative.  Past Medical History  Past Medical History  Diagnosis Date  . COLONIC POLYPS, HX OF   . Mixed hyperlipidemia   .  CHEST PAIN   . HIATAL HERNIA, HX OF   . MI (myocardial infarction)     Past Surgical History  Procedure Laterality Date  . Coronary artery bypass graft      Median sternotomy for coronary artery bypass grafting x3  (left internal mammary artery to distal left anterior  descending  coronary artery, saphenous vein graft to first diagonal branch,  saphenous vein graft to first  obtuse marginal branch, endoscopic  saphenous vein harvest from right thigh). SURGEON:  Valentina Gu.  Roxy Manns, MD  ASSISTANT:  John Giovanni, PA-C  ANESTHESIA:  Glynda Jaeger, MD   . Status post partial colectomy and cholecystectomy, 75 (ingram)        Social History Social History  Substance Use Topics  . Smoking status: Former Research scientist (life sciences)  . Smokeless tobacco: Never Used  . Alcohol Use: No   has a remote 10 year history of both alcohol and tobacco use (when he was a child) he is a Company secretary. Lives at home with his family. And is independent  with his ADLs.  Family History Family History  Problem Relation Age of Onset  . Lung cancer Sister   . Heart attack Father    his father had an MI at age 23. He knows of no history of brain tumors or syncopal episodes in the family.  Prior to Admission medications   Medication Sig Start Date End Date Taking? Authorizing Provider  aspirin 325 MG tablet Take 325 mg by mouth daily.     Yes Historical Provider, MD  atorvastatin (LIPITOR) 20 MG tablet TAKE 1 TABLET (20 MG TOTAL) BY MOUTH DAILY. 03/24/15  Yes Marletta Lor, MD  ketoconazole (NIZORAL) 2 % shampoo APPLY EVERY OTHER DAY TO SCALP--LATHER 5 MINUTES,THEN RINSE OUT 01/17/15  Yes Marletta Lor, MD  metoprolol tartrate (LOPRESSOR) 25 MG tablet TAKE 1 TABLET BY MOUTH 2 TIMES A DAY 02/23/15  Yes Marletta Lor, MD  nitroGLYCERIN (NITROSTAT) 0.4 MG SL tablet Place 1 tablet (0.4 mg total) under the tongue every 5 (five) minutes as needed for chest pain. 07/28/13  Yes Josue Hector, MD    Allergies  Allergen Reactions  . Levaquin [Levofloxacin In D5w] Swelling    Eyes swollen    Physical Exam  Vitals  Blood pressure 141/83, pulse 65, temperature 97.5 F (36.4 C), temperature source Oral, resp. rate 25, height 5\' 11"  (1.803 m), weight 88.451 kg (195 lb), SpO2 98 %.   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  Neuro:   Decreased sensation in the right lower extremity, strength is symmetric, no tongue deviation, no obvious cranial nerve deficits.  ENT:  Ears and Eyes appear Normal, Conjunctivae clear, PER. Currently nonreactive. (Epinephrine injection) Moist oral mucosa without erythema or exudates.  Neck:  Supple, No lymphadenopathy appreciated  Respiratory:  Symmetrical chest wall movement, Good air movement bilaterally, CTAB.  Cardiac:  RRR, No Murmurs, no LE edema  noted, no JVD.    Abdomen:  Positive bowel sounds, Soft, Non tender, Non distended,  No masses appreciated  Skin:  No Cyanosis, Normal Skin Turgor, No Skin Rash or bruise noted on right pretibial area, well-healed scar in his central chest  Extremities:  Able to move all 4. 5/5 strength in each,  no effusions.  Data Review  CBC  Recent Labs Lab 05/24/15 1045  WBC 10.2  HGB 13.5  HCT 39.8  PLT 133*  MCV 96.1  MCH 32.6  MCHC 33.9  RDW 13.0    Chemistries   Recent Labs Lab 05/24/15 1045  NA 138  K 4.3  CL 104  CO2 26  GLUCOSE 182*  BUN 21*  CREATININE 0.86  CALCIUM 9.0    Urinalysis    Component Value Date/Time   COLORURINE YELLOW 05/24/2015 1216   APPEARANCEUR CLEAR 05/24/2015 1216   LABSPEC 1.016 05/24/2015 1216   PHURINE 6.0 05/24/2015 1216   GLUCOSEU NEGATIVE 05/24/2015 1216   HGBUR NEGATIVE 05/24/2015 1216   BILIRUBINUR NEGATIVE 05/24/2015 1216   [place] 05/24/2015 1216   PROTEINUR NEGATIVE 05/24/2015 1216   UROBILINOGEN 0.2 05/24/2015 1216   NITRITE NEGATIVE 05/24/2015 1216   LEUKOCYTESUR NEGATIVE 05/24/2015 1216    Imaging results:   Dg Chest 2 View  05/24/2015   CLINICAL DATA:  Syncope  EXAM: CHEST  2 VIEW  COMPARISON:  05/14/2012  FINDINGS: Cardiomediastinal silhouette is stable. Status post CABG. No acute infiltrate or pleural effusion. No pulmonary edema. Degenerative changes bilateral AC joints.  IMPRESSION: No active cardiopulmonary disease.  Status post CABG.   Electronically Signed   By: Lahoma Crocker M.D.   On: 05/24/2015 11:02    My personal review of EKG: NSR, no acute changes since last tracing. Inverted T waves are not new.   Assessment & Plan  Principal Problem:   Syncope Active Problems:   CAD (coronary artery disease)   Hypotension   Wasp sting   Carotid artery disease   Coronary artery disease   Decreased sensation of lower extremity   Syncope and collapse Reportedly patient was out for 30 minutes,  nonresponsive, BP 80/53. Most likely vasovagal secondary to wasp sting and dehydration. Due to concurrent symptom of decreased sensation in the right lower extremity we will check a CT head Check 2-D echo. Given history of left carotid stenosis will check carotid Dopplers. Cycle troponins. Check TSH.  Physical therapy and occupational therapy evaluations.   Hypotension Secondary to vasovagal reaction. Responded to IV fluids. Will hold metoprolol for today and restart it tomorrow.   Carotid artery disease Check carotid duplex. Continue statin. Continue 325 mg aspirin.   Coronary artery disease Continue statin and aspirin.   Hyperglycemia Likely due to epinephrine injection this morning. We'll check hemoglobin A1c just in case.   Consultants Called:  None  Family Communication:   Daughters and wife at bedside  Code Status:  DO NOT RESUSCITATE  Condition:  Guarded but currently stable  Potential Disposition: To home on 9/15 if workup is negative.  Time spent in minutes : [place],  PA-C on 05/24/2015 at 2:31 PM Between 7am to 7pm - Pager - [phone] After 7pm go to [email or web address] - password [email or web address] And look for the night coverage person covering me after hours  [facility]

## 2015-05-24 NOTE — ED Provider Notes (Signed)
CSN: [id number]     Arrival date & time 05/24/15  4196 History   First MD Initiated Contact with Patient 05/24/15 1013     Chief Complaint  Patient presents with  . Loss of Consciousness  . Allergic Reaction     (Consider location/radiation/quality/duration/timing/severity/associated sxs/prior Treatment) HPI  Pt presents after syncopal episode after bee sting.  He was in his usual state of health this morning, after breakfast was working outside and had a bee sting in the center of his chest.  He came inside to sit on sofa- became dizzy and sweaty and syncopized.  Family members report that they could not feel a pulse and that he was not breathing.  Daughter administered epipen that was her mothers.  Unclear if there was much improvement.  No lip or tongue swelling, no hives.  Upon EMS arrival SBP was 80, received IV fluids via EMS and arrival BP 130/80.  Pt is at his baseline now.  No chest pain.  There are no other associated systemic symptoms, there are no other alleviating or modifying factors.   Past Medical History  Diagnosis Date  . COLONIC POLYPS, HX OF   . Mixed hyperlipidemia   . CHEST PAIN   . Coronary artery disease   . History of hiatal hernia   . MI (myocardial infarction) 09/2010  . COPD (chronic obstructive pulmonary disease)     "dx'd but I don't take RX for it" (05/24/2015)  . Chronic bronchitis     "get it ~ q yr" (05/24/2015)  . Sleep apnea   . GERD (gastroesophageal reflux disease)   . Arthritis     "right shoulder" (05/24/2015)  . Cystic kidney disease   . Skin cancer     "top of my head only" (05/24/2015)  . Syncope and collapse ~ 2013; 05/24/2015   Past Surgical History  Procedure Laterality Date  . Cholecystectomy open  1998  . Colectomy  1998    "partial"  . Coronary artery bypass graft  09/2010    Median sternotomy for coronary artery bypass grafting x3  (left internal mammary artery to distal left anterior  descending  coronary artery, saphenous vein  graft to first diagonal branch,  saphenous vein graft to first  obtuse marginal branch, endoscopic  saphenous vein harvest from right thigh). SURGEON:  Valentina Gu.  Roxy Manns, MD  ASSISTANT:  John Giovanni, PA-C  ANESTHESIA:  Glynda Jaeger, MD   . Cardiac catheterization  09/2010  . Skin cancer excision      "top of my head"  . Coronary angioplasty     Family History  Problem Relation Age of Onset  . Lung cancer Sister   . Heart attack Father    Social History  Substance Use Topics  . Smoking status: Former Smoker -- 1.50 packs/day for 3 years    Types: Cigarettes    Quit date: 12/09/1951  . Smokeless tobacco: Never Used  . Alcohol Use: Yes     Comment: "no alcohol since 1953"    Review of Systems  ROS reviewed and all otherwise negative except for mentioned in HPI    Allergies  Levaquin and Other  Home Medications   Prior to Admission medications   Medication Sig Start Date End Date Taking? Authorizing Provider  aspirin 325 MG tablet Take 325 mg by mouth daily.     Yes Historical Provider, MD  atorvastatin (LIPITOR) 20 MG tablet TAKE 1 TABLET (20 MG TOTAL) BY MOUTH DAILY. 03/24/15  Yes  Marletta Lor, MD  ketoconazole (NIZORAL) 2 % shampoo APPLY EVERY OTHER DAY TO SCALP--LATHER 5 MINUTES,THEN RINSE OUT 01/17/15  Yes Marletta Lor, MD  metoprolol tartrate (LOPRESSOR) 25 MG tablet TAKE 1 TABLET BY MOUTH 2 TIMES A DAY 02/23/15  Yes Marletta Lor, MD  nitroGLYCERIN (NITROSTAT) 0.4 MG SL tablet Place 1 tablet (0.4 mg total) under the tongue every 5 (five) minutes as needed for chest pain. 07/28/13  Yes Josue Hector, MD  EPINEPHrine (EPIPEN 2-PAK) 0.3 mg/0.3 mL IJ SOAJ injection Inject 0.3 mLs (0.3 mg total) into the muscle once. 05/25/15   Jessica U Vann, DO   BP 135/62 mmHg  Pulse 72  Temp(Src) 97.7 F (36.5 C) (Oral)  Resp 18  Ht 5\' 11"  (1.803 m)  Wt 188 lb 11.2 oz (85.594 kg)  BMI 26.33 kg/m2  SpO2 98%  Vitals reviewed Physical Exam  Physical Examination:  General appearance - alert, well appearing, and in no distress Mental status - alert, oriented to person, place, and time Eyes - no conjunctival injection, no scleral icterus. PERRL Mouth - mucous membranes moist, pharynx normal without lesions Chest - clear to auscultation, no wheezes, rales or rhonchi, symmetric air entry Heart - normal rate, regular rhythm, normal S1, S2, no murmurs, rubs, clicks or gallops Abdomen - soft, nontender, nondistended, no masses or organomegaly Neurological - alert, oriented, normal speech Extremities - peripheral pulses normal, no pedal edema, no clubbing or cyanosis Skin - in central chest there is 1cm erythematous area of erythema, not fluctuant or indurated c/w area of bee sting  ED Course  Procedures (including critical care time) Labs Review Labs Reviewed  CBC - Abnormal; Notable for the following:    RBC 4.14 (*)    Platelets 133 (*)    All other components within normal limits  BASIC METABOLIC PANEL - Abnormal; Notable for the following:    Glucose, Bld 182 (*)    BUN 21 (*)    All other components within normal limits  HEMOGLOBIN A1C - Abnormal; Notable for the following:    Hgb A1c MFr Bld 6.7 (*)    All other components within normal limits  TROPONIN I - Abnormal; Notable for the following:    Troponin I 0.04 (*)    All other components within normal limits  BASIC METABOLIC PANEL - Abnormal; Notable for the following:    Glucose, Bld 111 (*)    Calcium 8.4 (*)    All other components within normal limits  URINALYSIS, ROUTINE W REFLEX MICROSCOPIC (NOT AT [facility])  TSH  TROPONIN I  TROPONIN I  I-STAT TROPOININ, ED    Imaging Review No results found. I have personally reviewed and evaluated these images and lab results as part of my medical decision-making.   EKG Interpretation   Date/Time:  Wednesday May 24 2015 10:04:26 EDT Ventricular Rate:  61 PR Interval:  160 QRS Duration: 104 QT Interval:  443 QTC Calculation:  446 R Axis:   -25 Text Interpretation:  Sinus rhythm Borderline left axis deviation Abnormal  R-wave progression, early transition Nonspecific T abnrm, anterolateral  leads t wave inversions in anterior leads Confirmed by Canary Brim  MD, Jo-Anne Kluth  [phone]) on 05/24/2015 10:49:15 AM      MDM   Final diagnoses:  Syncope and collapse    Pt presenting with syncope after bee sting.  Appears to be more vagal in nature than anaphylactic- he does not have hives, no lip/tongues welling, no wheezing on lung exam.  1:28 PM d/w Imogene Burn, [facility] for [facility]- pt to be placed in obs, telemetry bed.   Alfonzo Beers, MD 06/01/15 1024

## 2015-05-25 ENCOUNTER — Observation Stay (HOSPITAL_BASED_OUTPATIENT_CLINIC_OR_DEPARTMENT_OTHER): Payer: Medicare Other

## 2015-05-25 DIAGNOSIS — R55 Syncope and collapse: Secondary | ICD-10-CM

## 2015-05-25 DIAGNOSIS — T63463S Toxic effect of venom of wasps, assault, sequela: Secondary | ICD-10-CM | POA: Diagnosis not present

## 2015-05-25 LAB — BASIC METABOLIC PANEL
Anion gap: 6 (ref 5–15)
BUN: 16 mg/dL (ref 6–20)
CALCIUM: 8.4 mg/dL — AB (ref 8.9–10.3)
CO2: 26 mmol/L (ref 22–32)
Chloride: 105 mmol/L (ref 101–111)
Creatinine, Ser: 0.75 mg/dL (ref 0.61–1.24)
GFR calc Af Amer: >60 mL/min (ref >60)
GLUCOSE: 111 mg/dL — AB (ref 65–99)
Potassium: 4.1 mmol/L (ref 3.5–5.1)
Sodium: 137 mmol/L (ref 135–145)

## 2015-05-25 LAB — TROPONIN I

## 2015-05-25 LAB — HEMOGLOBIN A1C
HEMOGLOBIN A1C: 6.7 % — AB (ref 4.8–5.6)
Mean Plasma Glucose: 146 mg/dL

## 2015-05-25 MED ORDER — EPINEPHRINE 0.3 MG/0.3ML IJ SOAJ: 0.3000 mg | Freq: Once | INTRAMUSCULAR | Status: DC

## 2015-05-25 NOTE — Progress Notes (Signed)
  Echocardiogram 2D Echocardiogram has been performed.  Jennette Dubin 05/25/2015, 11:05 AM

## 2015-05-25 NOTE — Discharge Summary (Signed)
Physician Discharge Summary  HYMEN ARNETT CHE:[id number] DOB: July 09, 1932 DOA: 05/24/2015  PCP: Nyoka Cowden, MD  Admit date: 05/24/2015 Discharge date: 05/25/2015  Time spent: 35 minutes  Recommendations for Outpatient Follow-up:  1. 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 He presents to the emergency room today with a syncopal episode. The patient and his family provides history. The patient fasted yesterday. He does that on Tuesdays as part of his religious practice. This morning he got up eighth and toast and went outside to pick okra. He was stung by a wasp. He came inside washed his face, suddenly felt dizzy and went to the couch to lay down. His family said he was nonresponsive for 30 minutes. They were unable to wake him. Could not feel a pulse. An said he was breathing 2 times a minute. His daughter injected him with epinephrine which she says caused him to breathe a little bit more quickly. EMS came and found his blood pressure to be 80/53. He was brought to the emergency department and has responded well to IV fluids. His blood pressure is now normal. The patient reports that he had a syncopal event approximately 2 years ago after a bee sting. But he denies any rash, shortness of breath, feeling of his throat closing, or tongue swelling. He has had multiple bee stings in  between the 2 that did not make him pass out. His family is concerned about a "deep" squamous cell skin cancer he has on his head that was recently removed. In Epic notice that he is due to have his carotid Dopplers updated as he had 50% left-sided stenosis in 2015.   In the emergency department labs are essentially normal with the exception of a BUN that is 21 and CBG that is 182. His point-of-care troponin is 0. His EKG is unchanged from previous. His family is very concerned. We will admit him for observation on telemetry overnight   Hospital Course:  Syncope: Suspect immune mediated shock after bee sting -improved with epi pen and IVF -echo done Patient anxious to leave   Procedures:  echo  Consultations:    Discharge Exam: Filed Vitals:   05/25/15 1423  BP:   Pulse: 72  Temp:   Resp:       Discharge Instructions   Discharge Instructions    Diet - low sodium heart healthy    Complete by:  As directed      Increase activity slowly    Complete by:  As directed           Current Discharge Medication List    START taking these medications   Details  EPINEPHrine (EPIPEN 2-PAK) 0.3 mg/0.3 mL IJ SOAJ injection Inject 0.3 mLs (0.3 mg total) into the muscle once. Qty: 2 Device, Refills: 0      CONTINUE these medications which have NOT CHANGED  Details  aspirin 325 MG tablet Take 325 mg by mouth daily.      atorvastatin (LIPITOR) 20 MG tablet TAKE 1 TABLET (20 MG TOTAL) BY MOUTH DAILY. Qty: 90 tablet, Refills: 1    ketoconazole (NIZORAL) 2 % shampoo APPLY EVERY OTHER DAY TO SCALP--LATHER 5 MINUTES,THEN RINSE OUT Qty: 120 mL, Refills: 5    metoprolol tartrate (LOPRESSOR) 25 MG tablet TAKE 1 TABLET BY MOUTH 2 TIMES A DAY Qty: 60 tablet, Refills: 5    nitroGLYCERIN (NITROSTAT) 0.4 MG SL tablet Place 1 tablet (0.4 mg total) under the tongue every 5 (five) minutes as needed for chest pain. Qty: 25 tablet, Refills: 4       Allergies  Allergen Reactions  .  Levaquin [Levofloxacin In D5w] Swelling    Eyes swollen   Follow-up Information    Follow up with Nyoka Cowden, MD In 1 week.   Specialty:  Internal Medicine   Contact information:   [address] [place] [id number] [phone]        The results of significant diagnostics from this hospitalization (including imaging, microbiology, ancillary and laboratory) are listed below for reference.    Significant Diagnostic Studies: Dg Chest 2 View  05/24/2015   CLINICAL DATA:  Syncope  EXAM: CHEST  2 VIEW  COMPARISON:  05/14/2012  FINDINGS: Cardiomediastinal silhouette is stable. Status post CABG. No acute infiltrate or pleural effusion. No pulmonary edema. Degenerative changes bilateral AC joints.  IMPRESSION: No active cardiopulmonary disease.  Status post CABG.   Electronically Signed   By: Lahoma Crocker M.D.   On: 05/24/2015 11:02   Ct Head Wo Contrast  05/24/2015   CLINICAL DATA:  Syncope, collapse.  Stung by a wasp.  EXAM: CT HEAD WITHOUT CONTRAST  TECHNIQUE: Contiguous axial images were obtained from the base of the skull through the vertex without intravenous contrast.  COMPARISON:  05/14/2012  FINDINGS: Mild age related volume loss. No acute intracranial abnormality. Specifically, no hemorrhage, hydrocephalus, mass lesion, acute infarction, or significant intracranial injury. No acute calvarial abnormality. Visualized paranasal sinuses and mastoids clear. Orbital soft tissues unremarkable.  IMPRESSION: No acute intracranial abnormality.   Electronically Signed   By: Rolm Baptise M.D.   On: 05/24/2015 15:26    Microbiology: No results found for this or any previous visit (from the past 240 hour(s)).   Labs: Basic Metabolic Panel:  Recent Labs Lab 05/24/15 1045 05/25/15 0348  NA 138 137  K 4.3 4.1  CL 104 105  CO2 26 26  GLUCOSE 182* 111*  BUN 21* 16  CREATININE 0.86 0.75  CALCIUM 9.0 8.4*   Liver Function Tests: No results for input(s): AST, ALT,  ALKPHOS, BILITOT, PROT, ALBUMIN in the last 168 hours. No results for input(s): LIPASE, AMYLASE in the last 168 hours. No results for input(s): AMMONIA in the last 168 hours. CBC:  Recent Labs Lab 05/24/15 1045  WBC 10.2  HGB 13.5  HCT 39.8  MCV 96.1  PLT 133*   Cardiac Enzymes:  Recent Labs Lab 05/24/15 1606 05/25/15 0012 05/25/15 0348  TROPONINI 0.04* <0.03 <0.03   BNP: BNP (last 3 results) No results for input(s): BNP in the last 8760 hours.  ProBNP (last 3 results) No results for input(s): PROBNP in the last 8760 hours.  CBG: No results for input(s): GLUCAP in the last 168 hours.     SignedEulogio Bear  [facility] 05/25/2015, 3:44 PM

## 2015-05-25 NOTE — Progress Notes (Signed)
VASCULAR LAB PRELIMINARY  PRELIMINARY  PRELIMINARY  PRELIMINARY  Carotid duplex completed.    Preliminary report:  Bilateral:  1-39% ICA stenosis.  Vertebral artery flow is antegrade.     Garrit Marrow, RVS 05/25/2015, 11:26 AM

## 2015-05-25 NOTE — Evaluation (Signed)
Physical Therapy Evaluation Patient Details Name: Michael Johnston MRN: [id number] DOB: [birth year] Today's Date: 05/25/2015   History of Present Illness  81 y.o. male admitted to [facility] on 05/24/15 with syncopal episode after  getting stung by a wasp.  In ED he was found to be hypotensive. Pt with significant PMhx of MI, COPD, syncope (in 2013), and CABG.  Clinical Impression  Pt is safe with all gait and mobility related to gait, stairs and balance.  He is mildly unsteady, especially when multi tasking, but he reports this is his baseline.  Orthostatic vitals taken and were negative, see below.  PT to sign off as pt is at his baseline level of functioning.     Follow Up Recommendations No PT follow up    Equipment Recommendations  None recommended by PT    Recommendations for Other Services   NA    Precautions / Restrictions Precautions Precautions: Fall Precaution Comments: pt is mildly unsteady on his feet      Mobility  Bed Mobility Overal bed mobility: Independent                Transfers Overall transfer level: Independent                  Ambulation/Gait Ambulation/Gait assistance: Supervision Ambulation Distance (Feet): 300 Feet Assistive device: None Gait Pattern/deviations: Step-through pattern;Staggering left;Staggering right   Gait velocity interpretation: at or above normal speed for age/gender General Gait Details: Pt only needed supervision with higher level balance activities and while multi tasking.  He was able to self correct LOB without external assist and reports he is a little "staggery" at baseline.   Stairs Stairs: Yes Stairs assistance: Supervision Stair Management: No rails;Alternating pattern;Forwards Number of Stairs: 9 General stair comments: pt did not use railing to try to show his independence, however, he had a little slip coming down the stairs that he was able to self correct.                Pertinent Vitals/Pain  Pain Assessment: No/denies pain    Home Living Family/patient expects to be discharged to:: Private residence Living Arrangements: Spouse/significant other Available Help at Discharge: Family;Available 24 hours/day Type of Home: House Home Access: Stairs to enter Entrance Stairs-Rails: Right Entrance Stairs-Number of Steps: 5 Home Layout: One level        Prior Function Level of Independence: Independent         Comments: pt is very active and likes to be outside in the garden or on the lawnmower        Extremity/Trunk Assessment   Upper Extremity Assessment: Defer to OT evaluation           Lower Extremity Assessment: Overall WFL for tasks assessed      Cervical / Trunk Assessment: Normal  Communication   Communication: HOH  Cognition Arousal/Alertness: Awake/alert Behavior During Therapy: WFL for tasks assessed/performed Overall Cognitive Status: Within Functional Limits for tasks assessed                               Assessment/Plan    PT Assessment Patent does not need any further PT services  PT Diagnosis Difficulty walking;Abnormality of gait;Generalized weakness         PT Goals (Current goals can be found in the Care Plan section) Acute Rehab PT Goals Patient Stated Goal: to go home PT Goal Formulation: All assessment and education complete, DC  therapy               End of Session   Activity Tolerance: Patient tolerated treatment well Patient left: in bed;with call bell/phone within reach;with family/visitor present Nurse Communication: Other (comment) (orthostatics negative)    Functional Assessment Tool Used: assist level Functional Limitation: Mobility: Walking and moving around Mobility: Walking and Moving Around Current Status (T5974): At least 1 percent but less than 20 percent impaired, limited or restricted Mobility: Walking and Moving Around Goal Status [phone]): At least 1 percent but less than 20 percent impaired,  limited or restricted Mobility: Walking and Moving Around Discharge Status [phone]): At least 1 percent but less than 20 percent impaired, limited or restricted    Time: 8032-1224 PT Time Calculation (min) (ACUTE ONLY): 25 min   Charges:   PT Evaluation $Initial PT Evaluation Tier I: 1 Procedure PT Treatments $Gait Training: 8-22 mins   PT G Codes:   PT G-Codes **NOT FOR INPATIENT CLASS** Functional Assessment Tool Used: assist level Functional Limitation: Mobility: Walking and moving around Mobility: Walking and Moving Around Current Status (M2500): At least 1 percent but less than 20 percent impaired, limited or restricted Mobility: Walking and Moving Around Goal Status [phone]): At least 1 percent but less than 20 percent impaired, limited or restricted Mobility: Walking and Moving Around Discharge Status [phone]): At least 1 percent but less than 20 percent impaired, limited or restricted    Toba Claudio B. [place], [place], DPT [phone]   05/25/2015, 2:34 PM

## 2015-05-25 NOTE — Progress Notes (Signed)
OT Cancellation Note  Patient Details Name: Michael Johnston MRN: [id number] DOB: [birth year]   Cancelled Treatment:    Reason Eval/Treat Not Completed: OT screened, no needs identified, will sign off. Per PT note and pt report, pt functioning at his baseline.  Malka So 05/25/2015, 3:27 PM  [phone]

## 2015-05-25 NOTE — Progress Notes (Signed)
Discharge teaching and instructions reviewed. VSS. Pt discharging home with family.

## 2015-05-25 NOTE — Progress Notes (Signed)
11:17 pt HR dropped to 30-40 unsustained. Central notified Type II. Pt in echo. Will page MD.

## 2015-05-26 ENCOUNTER — Telehealth: Payer: Self-pay | Admitting: *Deleted

## 2015-05-26 NOTE — Telephone Encounter (Signed)
Transition Care Management Follow-up Telephone Call  How have you been since you were released from the hospital? Doing fine   Do you understand why you were in the hospital? yes   Do you understand the discharge instrcutions? yes  Items Reviewed:  Medications reviewed: yes  Allergies reviewed: yes  Dietary changes reviewed: yes  Referrals reviewed: yes   Functional Questionnaire:   Activities of Daily Living (ADLs):   He states they are independent in the following: ambulation, bathing and hygiene, feeding and continence States they require assistance with the following: none   Any transportation issues/concerns?: no   Any patient concerns? yes   Confirmed importance and date/time of follow-up visits scheduled: yes   Confirmed with patient if condition begins to worsen call PCP or go to the ER.  Patient was given the Call-a-Nurse line [phone]: yes Patient was discharged Patient was discharged to home Patient has an appointment with Dr Raliegh Ip 05/31/15

## 2015-05-31 ENCOUNTER — Encounter: Payer: Self-pay | Admitting: Internal Medicine

## 2015-05-31 ENCOUNTER — Ambulatory Visit (INDEPENDENT_AMBULATORY_CARE_PROVIDER_SITE_OTHER): Payer: Medicare Other | Admitting: Internal Medicine

## 2015-05-31 VITALS — BP 126/70 | HR 58 | Temp 97.7°F | Resp 20 | Ht 71.0 in | Wt 191.0 lb

## 2015-05-31 DIAGNOSIS — K219 Gastro-esophageal reflux disease without esophagitis: Secondary | ICD-10-CM

## 2015-05-31 DIAGNOSIS — I2510 Atherosclerotic heart disease of native coronary artery without angina pectoris: Secondary | ICD-10-CM

## 2015-05-31 DIAGNOSIS — R55 Syncope and collapse: Secondary | ICD-10-CM | POA: Diagnosis not present

## 2015-05-31 NOTE — Progress Notes (Signed)
Pre visit review using our clinic review tool, if applicable. No additional management support is needed unless otherwise documented below in the visit note. 

## 2015-05-31 NOTE — Patient Instructions (Signed)
Limit your sodium (Salt) intake    It is important that you exercise regularly, at least 20 minutes 3 to 4 times per week.  If you develop chest pain or shortness of breath seek  medical attention.  Cardiology follow-up as planned  Return in one year for follow-up

## 2015-05-31 NOTE — Progress Notes (Signed)
Subjective:    Patient ID: Michael Johnston, male    DOB: 1932-09-05, 81 y.o.   MRN: [id number]  HPI Admit date: 05/24/2015 Discharge date: 05/25/2015   Recommendations for Outpatient Follow-up:  1. Epi pen given  Discharge Diagnoses:  Principal Problem:  Syncope Active Problems:  CAD (coronary artery disease)  Hypotension  Wasp sting  Carotid artery disease  Coronary artery disease  Decreased sensation of lower extremity  Syncope and collapse  81 year old patient seen today following a recent hospital discharge last week.  He was admitted for evaluation and observation following a syncopal episode following a wasp sting.  He has done well since his hospital discharge.  Evaluation included follow-up carotid artery Doppler ultrasound that revealed bilateral 1-39% ICA stenoses.  Hospital records reviewed  Cardiology follow-up scheduled in 2 months  Past Medical History  Diagnosis Date  . COLONIC POLYPS, HX OF   . Mixed hyperlipidemia   . CHEST PAIN   . Coronary artery disease   . History of hiatal hernia   . MI (myocardial infarction) 09/2010  . COPD (chronic obstructive pulmonary disease)     "dx'd but I don't take RX for it" (05/24/2015)  . Chronic bronchitis     "get it ~ q yr" (05/24/2015)  . Sleep apnea   . GERD (gastroesophageal reflux disease)   . Arthritis     "right shoulder" (05/24/2015)  . Cystic kidney disease   . Skin cancer     "top of my head only" (05/24/2015)  . Syncope and collapse ~ 2013; 05/24/2015    Social History   Social History  . Marital Status: Married    Spouse Name: N/A  . Number of Children: N/A  . Years of Education: N/A   Occupational History  . Not on file.   Social History Main Topics  . Smoking status: Former Smoker -- 1.50 packs/day for 3 years    Types: Cigarettes    Quit date: 12/09/1951  . Smokeless tobacco: Never Used  . Alcohol Use: Yes     Comment: "no alcohol since 1953"  . Drug Use: No  . Sexual  Activity: Not on file   Other Topics Concern  . Not on file   Social History Narrative    Past Surgical History  Procedure Laterality Date  . Cholecystectomy open  1998  . Colectomy  1998    "partial"  . Coronary artery bypass graft  09/2010    Median sternotomy for coronary artery bypass grafting x3  (left internal mammary artery to distal left anterior  descending  coronary artery, saphenous vein graft to first diagonal branch,  saphenous vein graft to first  obtuse marginal branch, endoscopic  saphenous vein harvest from right thigh). SURGEON:  Valentina Gu.  Roxy Manns, MD  ASSISTANT:  John Giovanni, PA-C  ANESTHESIA:  Glynda Jaeger, MD   . Cardiac catheterization  09/2010  . Skin cancer excision      "top of my head"  . Coronary angioplasty      Family History  Problem Relation Age of Onset  . Lung cancer Sister   . Heart attack Father     Allergies  Allergen Reactions  . Levaquin [Levofloxacin In D5w] Swelling    Eyes swollen  . Other Anaphylaxis    Bee sting    Current Outpatient Prescriptions on File Prior to Visit  Medication Sig Dispense Refill  . aspirin 325 MG tablet Take 325 mg by mouth daily.      Marland Kitchen  atorvastatin (LIPITOR) 20 MG tablet TAKE 1 TABLET (20 MG TOTAL) BY MOUTH DAILY. 90 tablet 1  . EPINEPHrine (EPIPEN 2-PAK) 0.3 mg/0.3 mL IJ SOAJ injection Inject 0.3 mLs (0.3 mg total) into the muscle once. 2 Device 0  . ketoconazole (NIZORAL) 2 % shampoo APPLY EVERY OTHER DAY TO SCALP--LATHER 5 MINUTES,THEN RINSE OUT 120 mL 5  . metoprolol tartrate (LOPRESSOR) 25 MG tablet TAKE 1 TABLET BY MOUTH 2 TIMES A DAY 60 tablet 5  . nitroGLYCERIN (NITROSTAT) 0.4 MG SL tablet Place 1 tablet (0.4 mg total) under the tongue every 5 (five) minutes as needed for chest pain. 25 tablet 4   No current facility-administered medications on file prior to visit.    BP 126/70 mmHg  Pulse 58  Temp(Src) 97.7 F (36.5 C) (Oral)  Resp 20  Ht 5\' 11"  (1.803 m)  Wt 191 lb (86.637 kg)  BMI  26.65 kg/m2  SpO2 98%    Review of Systems  Constitutional: Negative for fever, chills, appetite change and fatigue.  HENT: Negative for congestion, dental problem, ear pain, hearing loss, sore throat, tinnitus, trouble swallowing and voice change.   Eyes: Negative for pain, discharge and visual disturbance.  Respiratory: Negative for cough, chest tightness, wheezing and stridor.   Cardiovascular: Negative for chest pain, palpitations and leg swelling.  Gastrointestinal: Negative for nausea, vomiting, abdominal pain, diarrhea, constipation, blood in stool and abdominal distention.  Genitourinary: Negative for urgency, hematuria, flank pain, discharge, difficulty urinating and genital sores.  Musculoskeletal: Negative for myalgias, back pain, joint swelling, arthralgias, gait problem and neck stiffness.  Skin: Negative for rash.  Neurological: Positive for syncope. Negative for dizziness, speech difficulty, weakness, numbness and headaches.  Hematological: Negative for adenopathy. Does not bruise/bleed easily.  Psychiatric/Behavioral: Negative for behavioral problems and dysphoric mood. The patient is not nervous/anxious.        Objective:   Physical Exam  Constitutional: He is oriented to person, place, and time. He appears well-developed.  HENT:  Head: Normocephalic.  Right Ear: External ear normal.  Left Ear: External ear normal.  Eyes: Conjunctivae and EOM are normal.  Neck: Normal range of motion.  Cardiovascular: Normal rate and normal heart sounds.   Pulmonary/Chest: Breath sounds normal.  Abdominal: Bowel sounds are normal.  Musculoskeletal: Normal range of motion. He exhibits no edema or tenderness.  Neurological: He is alert and oriented to person, place, and time.  Psychiatric: He has a normal mood and affect. His behavior is normal.          Assessment & Plan:   History of syncope Coronary artery disease Mild carotid artery disease  Cardiology follow-up as  scheduled CPX here one year Return as needed

## 2015-06-02 ENCOUNTER — Other Ambulatory Visit: Payer: Self-pay | Admitting: *Deleted

## 2015-06-02 MED ORDER — METOPROLOL TARTRATE 25 MG PO TABS: 25.0000 mg | ORAL_TABLET | Freq: Two times a day (BID) | ORAL | Status: DC

## 2015-06-13 DIAGNOSIS — C4442 Squamous cell carcinoma of skin of scalp and neck: Secondary | ICD-10-CM | POA: Diagnosis not present

## 2015-07-13 DIAGNOSIS — D1801 Hemangioma of skin and subcutaneous tissue: Secondary | ICD-10-CM | POA: Diagnosis not present

## 2015-07-13 DIAGNOSIS — L859 Epidermal thickening, unspecified: Secondary | ICD-10-CM | POA: Diagnosis not present

## 2015-07-13 DIAGNOSIS — L709 Acne, unspecified: Secondary | ICD-10-CM | POA: Diagnosis not present

## 2015-07-13 DIAGNOSIS — L821 Other seborrheic keratosis: Secondary | ICD-10-CM | POA: Diagnosis not present

## 2015-07-20 ENCOUNTER — Encounter: Payer: Self-pay | Admitting: *Deleted

## 2015-07-25 NOTE — Progress Notes (Signed)
Patient ID: Michael Johnston, male   DOB: [birth year], 81 y.o.   MRN: [id number]   Michael Johnston is seen for F/U CAD SEMI after getting injections at the dentist office September 27, 2010. Subsequently found to have 3VD needing CABG:  Note the RCA was not grafted   1/12  CABG Left internal mammary artery to the distal LAD, coronary artery a  saphenous vein graft to the first diagonal branch, and a saphenous  vein graft to the first obtuse marginal branch. He had endoscopic  saphenous vein harvest from the right thigh. He tolerated  procedure well, was taken to the surgical intensive care unit in  stable condition. Operative findings showed normal left  ventricular function, good-quality conduits, good quality targets.   Duplex in 12/27/13  reviewed showed 40-59% bilateral carotid disease. 05/25/15 duplex read by Dr Leonie Man 1-39% ICA plaque no stenosis  F/U myovue 11/14 normal EF 71% Stopping statin made no difference with memory so he is back on it   10/18/14  LDL 51  with normal LFTls   Seen in hospital 05/2015 Syncope this was in setting of fasting and a wasp sting.    Echo reviewed:  ------------------------------------------------------------------- Study Conclusions  - Left ventricle: The cavity size was normal. Wall thickness was increased in a pattern of mild LVH. Systolic function was normal. The estimated ejection fraction was in the range of 55% to 60%. Wall motion was normal; there were no regional wall motion abnormalities. Doppler parameters are consistent with abnormal left ventricular relaxation (grade 1 diastolic dysfunction). The E/e&' ratio is between 8-15, suggesting indeterminate LV filling pressure. - Mitral valve: Calcified annulus. There was trivial regurgitation. - Left atrium: The atrium was normal in size. - Right atrium: The atrium was mildly dilated. - Tricuspid valve: There was trivial regurgitation. - Pulmonary arteries: PA peak pressure: 24 mm Hg  (S). - Inferior vena cava: The vessel was normal in size. The respirophasic diameter changes were in the normal range (>= 50%), consistent with normal central venous pressure.   ROS: Denies fever, malais, weight loss, blurry vision, decreased visual acuity, cough, sputum, SOB, hemoptysis, pleuritic pain, palpitaitons, heartburn, abdominal pain, melena, lower extremity edema, claudication, or rash.  All other systems reviewed and negative  General: Affect appropriate Healthy:  appears stated age 81: normal Neck supple with no adenopathy JVP normal no bruits no thyromegaly Lungs clear with no wheezing and good diaphragmatic motion Heart:  S1/S2 no murmur, no rub, gallop or click PMI normal Abdomen: benighn, BS positve, no tenderness, no AAA no bruit.  No HSM or HJR Distal pulses intact with no bruits No edema Neuro non-focal Skin warm and dry No muscular weakness   Current Outpatient Prescriptions  Medication Sig Dispense Refill  . aspirin 81 MG tablet Take 81 mg by mouth daily.    Marland Kitchen atorvastatin (LIPITOR) 20 MG tablet TAKE 1 TABLET (20 MG TOTAL) BY MOUTH DAILY. 90 tablet 1  . EPINEPHrine (EPIPEN 2-PAK) 0.3 mg/0.3 mL IJ SOAJ injection Inject 0.3 mLs (0.3 mg total) into the muscle once. 2 Device 0  . ketoconazole (NIZORAL) 2 % shampoo APPLY EVERY OTHER DAY TO SCALP--LATHER 5 MINUTES,THEN RINSE OUT 120 mL 5  . metoprolol tartrate (LOPRESSOR) 25 MG tablet Take 1 tablet (25 mg total) by mouth 2 (two) times daily. 180 tablet 1  . nitroGLYCERIN (NITROSTAT) 0.4 MG SL tablet Place 0.4 mg under the tongue every 5 (five) minutes as needed for chest pain (3 doses max).     No  current facility-administered medications for this visit.    Allergies  Levaquin and Other  Electrocardiogram:   01/26/14  SR rate 54 normal  01/26/15  SR rate 64  LAD LVH no significant change   Assessment and Plan  Syncope: related to wasp sting Has Epi Pen now  CAD:  CABG 2012  Active with no angina  continue medical Rx HTN:  Well controlled.  Continue current medications and low sodium Dash type diet.   Chol:   Cholesterol is at goal.  Continue current dose of statin and diet Rx.  No myalgias or side effects.  F/U  LFT's in 6 months. Lab Results  Component Value Date   LDLCALC 51 10/18/2014            F/U with me in 6 months

## 2015-07-26 ENCOUNTER — Ambulatory Visit (INDEPENDENT_AMBULATORY_CARE_PROVIDER_SITE_OTHER): Payer: Medicare Other | Admitting: Cardiovascular Disease

## 2015-07-26 ENCOUNTER — Encounter: Payer: Self-pay | Admitting: Cardiovascular Disease

## 2015-07-26 VITALS — BP 110/66 | HR 73 | Ht 72.0 in | Wt 188.1 lb

## 2015-07-26 DIAGNOSIS — I2583 Coronary atherosclerosis due to lipid rich plaque: Secondary | ICD-10-CM

## 2015-07-26 DIAGNOSIS — I2510 Atherosclerotic heart disease of native coronary artery without angina pectoris: Secondary | ICD-10-CM

## 2015-07-26 DIAGNOSIS — E782 Mixed hyperlipidemia: Secondary | ICD-10-CM | POA: Diagnosis not present

## 2015-07-26 NOTE — Patient Instructions (Signed)

## 2015-08-14 ENCOUNTER — Other Ambulatory Visit: Payer: Self-pay | Admitting: Internal Medicine

## 2015-10-06 DIAGNOSIS — H52223 Regular astigmatism, bilateral: Secondary | ICD-10-CM | POA: Diagnosis not present

## 2015-10-06 DIAGNOSIS — H524 Presbyopia: Secondary | ICD-10-CM | POA: Diagnosis not present

## 2015-10-06 DIAGNOSIS — H5211 Myopia, right eye: Secondary | ICD-10-CM | POA: Diagnosis not present

## 2015-10-06 DIAGNOSIS — H5202 Hypermetropia, left eye: Secondary | ICD-10-CM | POA: Diagnosis not present

## 2015-11-02 DIAGNOSIS — L570 Actinic keratosis: Secondary | ICD-10-CM | POA: Diagnosis not present

## 2015-11-02 DIAGNOSIS — C44229 Squamous cell carcinoma of skin of left ear and external auricular canal: Secondary | ICD-10-CM | POA: Diagnosis not present

## 2015-11-02 DIAGNOSIS — D485 Neoplasm of uncertain behavior of skin: Secondary | ICD-10-CM | POA: Diagnosis not present

## 2015-11-02 DIAGNOSIS — L821 Other seborrheic keratosis: Secondary | ICD-10-CM | POA: Diagnosis not present

## 2015-11-16 DIAGNOSIS — C44229 Squamous cell carcinoma of skin of left ear and external auricular canal: Secondary | ICD-10-CM | POA: Diagnosis not present

## 2015-12-12 ENCOUNTER — Other Ambulatory Visit: Payer: Self-pay | Admitting: Cardiovascular Disease

## 2015-12-12 DIAGNOSIS — I6523 Occlusion and stenosis of bilateral carotid arteries: Secondary | ICD-10-CM

## 2015-12-18 DIAGNOSIS — N401 Enlarged prostate with lower urinary tract symptoms: Secondary | ICD-10-CM | POA: Diagnosis not present

## 2015-12-28 ENCOUNTER — Ambulatory Visit (HOSPITAL_COMMUNITY)
Admission: RE | Admit: 2015-12-28 | Discharge: 2015-12-28 | Disposition: A | Discharge status: Home / Self Care | Payer: Medicare Other | Source: Ambulatory Visit | Attending: Cardiology | Admitting: Cardiology

## 2015-12-28 DIAGNOSIS — I2510 Atherosclerotic heart disease of native coronary artery without angina pectoris: Secondary | ICD-10-CM | POA: Insufficient documentation

## 2015-12-28 DIAGNOSIS — Z87891 Personal history of nicotine dependence: Secondary | ICD-10-CM | POA: Insufficient documentation

## 2015-12-28 DIAGNOSIS — E785 Hyperlipidemia, unspecified: Secondary | ICD-10-CM | POA: Insufficient documentation

## 2015-12-28 DIAGNOSIS — I6523 Occlusion and stenosis of bilateral carotid arteries: Secondary | ICD-10-CM | POA: Diagnosis not present

## 2015-12-28 DIAGNOSIS — Z951 Presence of aortocoronary bypass graft: Secondary | ICD-10-CM | POA: Insufficient documentation

## 2016-01-08 ENCOUNTER — Other Ambulatory Visit: Payer: Self-pay | Admitting: Internal Medicine

## 2016-01-30 NOTE — Progress Notes (Signed)
Patient ID: Michael Johnston, male   DOB: 04/19/1932, 82 y.o.   MRN: [id number]   Michael Johnston is seen for F/U CAD SEMI after getting injections at the dentist office September 27, 2010. Subsequently found to have 3VD needing CABG:  Note the RCA was not grafted   1/12  CABG Left internal mammary artery to the distal LAD, coronary artery a  saphenous vein graft to the first diagonal branch, and a saphenous  vein graft to the first obtuse marginal branch. He had endoscopic  saphenous vein harvest from the right thigh. He tolerated  procedure well, was taken to the surgical intensive care unit in  stable condition. Operative findings showed normal left  ventricular function, good-quality conduits, good quality targets.   Duplex in 12/27/13  reviewed showed 40-59% bilateral carotid disease. 05/25/15 duplex read by Dr Leonie Man 1-39% ICA plaque no stenosis  F/U myovue 11/14 normal EF 71% Stopping statin made no difference with memory so he is back on it   10/18/14  LDL 51  with normal LFTls   Seen in hospital 05/2015 Syncope this was in setting of fasting and a wasp sting.    Echo reviewed:  ------------------------------------------------------------------- Study Conclusions  - Left ventricle: The cavity size was normal. Wall thickness was increased in a pattern of mild LVH. Systolic function was normal. The estimated ejection fraction was in the range of 55% to 60%. Wall motion was normal; there were no regional wall motion abnormalities. Doppler parameters are consistent with abnormal left ventricular relaxation (grade 1 diastolic dysfunction). The E/e&' ratio is between 8-15, suggesting indeterminate LV filling pressure. - Mitral valve: Calcified annulus. There was trivial regurgitation. - Left atrium: The atrium was normal in size. - Right atrium: The atrium was mildly dilated. - Tricuspid valve: There was trivial regurgitation. - Pulmonary arteries: PA peak pressure: 24 mm Hg  (S). - Inferior vena cava: The vessel was normal in size. The respirophasic diameter changes were in the normal range (>= 50%), consistent with normal central venous pressure.   ROS: Denies fever, malais, weight loss, blurry vision, decreased visual acuity, cough, sputum, SOB, hemoptysis, pleuritic pain, palpitaitons, heartburn, abdominal pain, melena, lower extremity edema, claudication, or rash.  All other systems reviewed and negative  General: Affect appropriate Healthy:  appears stated age 32: normal Neck supple with no adenopathy JVP normal no bruits no thyromegaly Lungs clear with no wheezing and good diaphragmatic motion Heart:  S1/S2 no murmur, no rub, gallop or click PMI normal Abdomen: benighn, BS positve, no tenderness, no AAA no bruit.  No HSM or HJR Distal pulses intact with no bruits No edema Neuro non-focal Skin warm and dry No muscular weakness   Current Outpatient Prescriptions  Medication Sig Dispense Refill  . aspirin 81 MG tablet Take 81 mg by mouth daily.    Marland Kitchen atorvastatin (LIPITOR) 20 MG tablet TAKE 1 TABLET (20 MG TOTAL) BY MOUTH DAILY. 90 tablet 1  . EPINEPHrine (EPIPEN 2-PAK) 0.3 mg/0.3 mL IJ SOAJ injection Inject 0.3 mLs (0.3 mg total) into the muscle once. 2 Device 0  . ketoconazole (NIZORAL) 2 % shampoo APPLY EVERY OTHER DAY TO SCALP--LATHER 5 MINUTES,THEN RINSE OUT 120 mL 5  . metoprolol tartrate (LOPRESSOR) 25 MG tablet TAKE 1 TABLET (25 MG TOTAL) BY MOUTH 2 (TWO) TIMES DAILY. 180 tablet 1  . nitroGLYCERIN (NITROSTAT) 0.4 MG SL tablet Place 0.4 mg under the tongue every 5 (five) minutes as needed for chest pain (3 doses max).     No  current facility-administered medications for this visit.    Allergies  Levaquin and Other  Electrocardiogram:   01/26/14  SR rate 54 normal  01/26/15  SR rate 64  LAD LVH no significant change   Assessment and Plan  Syncope: related to wasp sting Has Epi Pen now  CAD:  CABG 2012  Active with no angina  continue medical Rx HTN:  Well controlled.  Continue current medications and low sodium Dash type diet.   Chol:   Cholesterol is at goal.  Continue current dose of statin and diet Rx.  No myalgias or side effects.  F/U  LFT's in 6 months. Lab Results  Component Value Date   LDLCALC 51 10/18/2014            F/U with me in 6 months   [name]

## 2016-01-31 ENCOUNTER — Encounter: Payer: Self-pay | Admitting: Cardiovascular Disease

## 2016-01-31 ENCOUNTER — Ambulatory Visit (INDEPENDENT_AMBULATORY_CARE_PROVIDER_SITE_OTHER): Payer: Medicare Other | Admitting: Cardiovascular Disease

## 2016-01-31 DIAGNOSIS — E782 Mixed hyperlipidemia: Secondary | ICD-10-CM

## 2016-01-31 MED ORDER — NITROGLYCERIN 0.4 MG SL SUBL: 0.4000 mg | SUBLINGUAL_TABLET | SUBLINGUAL | Status: DC | PRN

## 2016-01-31 NOTE — Patient Instructions (Signed)

## 2016-03-28 ENCOUNTER — Other Ambulatory Visit: Payer: Self-pay | Admitting: Internal Medicine

## 2016-03-28 NOTE — Telephone Encounter (Signed)
Pt need new Rx for ketoconazole    Pharm: [facility]

## 2016-03-29 ENCOUNTER — Other Ambulatory Visit: Payer: Self-pay | Admitting: Emergency Medicine

## 2016-03-29 MED ORDER — KETOCONAZOLE 2 % EX SHAM: MEDICATED_SHAMPOO | CUTANEOUS | Status: DC

## 2016-03-29 NOTE — Telephone Encounter (Signed)
Looks like RX was sent to pharmacy 03/29/16.

## 2016-04-13 ENCOUNTER — Other Ambulatory Visit: Payer: Self-pay | Admitting: Internal Medicine

## 2016-05-24 DIAGNOSIS — H903 Sensorineural hearing loss, bilateral: Secondary | ICD-10-CM | POA: Diagnosis not present

## 2016-07-17 DIAGNOSIS — C4442 Squamous cell carcinoma of skin of scalp and neck: Secondary | ICD-10-CM | POA: Diagnosis not present

## 2016-07-17 DIAGNOSIS — L820 Inflamed seborrheic keratosis: Secondary | ICD-10-CM | POA: Diagnosis not present

## 2016-07-17 DIAGNOSIS — Z85828 Personal history of other malignant neoplasm of skin: Secondary | ICD-10-CM | POA: Diagnosis not present

## 2016-07-17 DIAGNOSIS — L814 Other melanin hyperpigmentation: Secondary | ICD-10-CM | POA: Diagnosis not present

## 2016-07-17 DIAGNOSIS — D485 Neoplasm of uncertain behavior of skin: Secondary | ICD-10-CM | POA: Diagnosis not present

## 2016-07-17 DIAGNOSIS — L821 Other seborrheic keratosis: Secondary | ICD-10-CM | POA: Diagnosis not present

## 2016-07-17 DIAGNOSIS — L570 Actinic keratosis: Secondary | ICD-10-CM | POA: Diagnosis not present

## 2016-07-17 DIAGNOSIS — D1801 Hemangioma of skin and subcutaneous tissue: Secondary | ICD-10-CM | POA: Diagnosis not present

## 2016-07-29 ENCOUNTER — Other Ambulatory Visit: Payer: Self-pay | Admitting: Internal Medicine

## 2016-08-05 ENCOUNTER — Other Ambulatory Visit: Payer: Self-pay | Admitting: Internal Medicine

## 2016-08-08 DIAGNOSIS — J40 Bronchitis, not specified as acute or chronic: Secondary | ICD-10-CM | POA: Diagnosis not present

## 2016-09-24 DIAGNOSIS — D485 Neoplasm of uncertain behavior of skin: Secondary | ICD-10-CM | POA: Diagnosis not present

## 2016-09-24 DIAGNOSIS — C4441 Basal cell carcinoma of skin of scalp and neck: Secondary | ICD-10-CM | POA: Diagnosis not present

## 2016-09-24 DIAGNOSIS — L570 Actinic keratosis: Secondary | ICD-10-CM | POA: Diagnosis not present

## 2016-09-24 DIAGNOSIS — D044 Carcinoma in situ of skin of scalp and neck: Secondary | ICD-10-CM | POA: Diagnosis not present

## 2016-10-10 DIAGNOSIS — C4441 Basal cell carcinoma of skin of scalp and neck: Secondary | ICD-10-CM | POA: Diagnosis not present

## 2016-10-15 ENCOUNTER — Ambulatory Visit (INDEPENDENT_AMBULATORY_CARE_PROVIDER_SITE_OTHER): Payer: Medicare Other | Admitting: Internal Medicine

## 2016-10-15 ENCOUNTER — Encounter: Payer: Self-pay | Admitting: Internal Medicine

## 2016-10-15 ENCOUNTER — Other Ambulatory Visit: Payer: Self-pay | Admitting: Internal Medicine

## 2016-10-15 VITALS — BP 144/74 | HR 67 | Temp 97.8°F | Ht 72.0 in | Wt 202.6 lb

## 2016-10-15 DIAGNOSIS — E782 Mixed hyperlipidemia: Secondary | ICD-10-CM

## 2016-10-15 DIAGNOSIS — G609 Hereditary and idiopathic neuropathy, unspecified: Secondary | ICD-10-CM

## 2016-10-15 DIAGNOSIS — Z0000 Encounter for general adult medical examination without abnormal findings: Secondary | ICD-10-CM

## 2016-10-15 DIAGNOSIS — M21372 Foot drop, left foot: Secondary | ICD-10-CM

## 2016-10-15 DIAGNOSIS — R208 Other disturbances of skin sensation: Secondary | ICD-10-CM

## 2016-10-15 DIAGNOSIS — I2510 Atherosclerotic heart disease of native coronary artery without angina pectoris: Secondary | ICD-10-CM | POA: Diagnosis not present

## 2016-10-15 DIAGNOSIS — G629 Polyneuropathy, unspecified: Secondary | ICD-10-CM | POA: Insufficient documentation

## 2016-10-15 LAB — LIPID PANEL
CHOLESTEROL: 120 mg/dL (ref 0–200)
HDL: 47.6 mg/dL (ref >39.00)
LDL CALC: 55 mg/dL (ref 0–99)
NonHDL: 72.55
Total CHOL/HDL Ratio: 3
Triglycerides: 90 mg/dL (ref 0.0–149.0)
VLDL: 18 mg/dL (ref 0.0–40.0)

## 2016-10-15 LAB — CBC WITH DIFFERENTIAL/PLATELET
Basophils Absolute: 0.1 10*3/uL (ref 0.0–0.1)
Basophils Relative: 1.1 % (ref 0.0–3.0)
Eosinophils Absolute: 0.2 10*3/uL (ref 0.0–0.7)
Eosinophils Relative: 4.4 % (ref 0.0–5.0)
HEMATOCRIT: 39.2 % (ref 39.0–52.0)
HEMOGLOBIN: 13.4 g/dL (ref 13.0–17.0)
LYMPHS PCT: 36.7 % (ref 12.0–46.0)
Lymphs Abs: 1.7 10*3/uL (ref 0.7–4.0)
MCHC: 34.1 g/dL (ref 30.0–36.0)
MCV: 96.5 fl (ref 78.0–100.0)
MONOS PCT: 7.7 % (ref 3.0–12.0)
Monocytes Absolute: 0.4 10*3/uL (ref 0.1–1.0)
NEUTROS ABS: 2.3 10*3/uL (ref 1.4–7.7)
Neutrophils Relative %: 50.1 % (ref 43.0–77.0)
Platelets: 183 10*3/uL (ref 150.0–400.0)
RBC: 4.07 Mil/uL — AB (ref 4.22–5.81)
RDW: 13.6 % (ref 11.5–15.5)
WBC: 4.7 10*3/uL (ref 4.0–10.5)

## 2016-10-15 LAB — COMPREHENSIVE METABOLIC PANEL
ALBUMIN: 4.1 g/dL (ref 3.5–5.2)
ALK PHOS: 71 U/L (ref 39–117)
ALT: 17 U/L (ref 0–53)
AST: 14 U/L (ref 0–37)
BILIRUBIN TOTAL: 0.5 mg/dL (ref 0.2–1.2)
BUN: 17 mg/dL (ref 6–23)
CALCIUM: 9 mg/dL (ref 8.4–10.5)
CO2: 30 mEq/L (ref 19–32)
Chloride: 105 mEq/L (ref 96–112)
Creatinine, Ser: 0.71 mg/dL (ref 0.40–1.50)
GFR: 112.81 mL/min (ref >60.00)
Glucose, Bld: 155 mg/dL — ABNORMAL HIGH (ref 70–99)
Potassium: 4.1 mEq/L (ref 3.5–5.1)
Sodium: 139 mEq/L (ref 135–145)
Total Protein: 6.4 g/dL (ref 6.0–8.3)

## 2016-10-15 LAB — VITAMIN B12: VITAMIN B 12: 171 pg/mL — AB (ref 211–911)

## 2016-10-15 LAB — TSH: TSH: 2.84 u[IU]/mL (ref 0.35–4.50)

## 2016-10-15 LAB — HEMOGLOBIN A1C: Hgb A1c MFr Bld: 7.8 % — ABNORMAL HIGH (ref 4.6–6.5)

## 2016-10-15 MED ORDER — METFORMIN HCL ER 500 MG PO TB24: 1000.0000 mg | ORAL_TABLET | Freq: Every day | ORAL | 2 refills | Status: DC

## 2016-10-15 NOTE — Progress Notes (Signed)
Subjective:    Patient ID: Michael Johnston, male    DOB: [birth year], 83 y.o.   MRN: [id number]  83 year old patient who is seen today for annual follow-up and for a Medicare wellness visit.   He has a history of coronary artery disease and is status post CABG.  He is followed by cardiology.  He is also scheduled to see urology later this spring.   He states he he occasionally trips over his L foot  Carotid artery Doppler study April 2017 Perfusion stress test December 2014 Colonoscopy 2010    HPI Past Medical History:  Diagnosis Date  . Arthritis    "right shoulder" (05/24/2015)  . CHEST PAIN   . Chronic bronchitis ([place])    "get it ~ q yr" (05/24/2015)  . COLONIC POLYPS, HX OF   . COPD (chronic obstructive pulmonary disease) ([place])    "dx'd but I don't take RX for it" (05/24/2015)  . Coronary artery disease   . Cystic kidney disease   . GERD (gastroesophageal reflux disease)   . History of hiatal hernia   . MI (myocardial infarction) 09/2010  . Mixed hyperlipidemia   . Skin cancer    "top of my head only" (05/24/2015)  . Sleep apnea   . Syncope and collapse ~ 2013; 05/24/2015     Social History   Social History  . Marital status: Married    Spouse name: N/A  . Number of children: N/A  . Years of education: N/A   Occupational History  . Not on file.   Social History Main Topics  . Smoking status: Former Smoker    Packs/day: 1.50    Years: 3.00    Types: Cigarettes    Quit date: 12/09/1951  . Smokeless tobacco: Never Used  . Alcohol use Yes     Comment: "no alcohol since 1953"  . Drug use: No  . Sexual activity: Not on file   Other Topics Concern  . Not on file   Social History Narrative  . No narrative on file    Past Surgical History:  Procedure Laterality Date  . CARDIAC CATHETERIZATION  09/2010  . CHOLECYSTECTOMY OPEN  1998  . COLECTOMY  1998   "partial"  . CORONARY ANGIOPLASTY    . CORONARY ARTERY BYPASS GRAFT  09/2010   Median sternotomy for  coronary artery bypass grafting x3  (left internal mammary artery to distal left anterior  descending  coronary artery, saphenous vein graft to first diagonal branch,  saphenous vein graft to first  obtuse marginal branch, endoscopic  saphenous vein harvest from right thigh). SURGEON:  Valentina Gu.  Roxy Manns, MD  ASSISTANT:  John Giovanni, PA-C  ANESTHESIA:  Glynda Jaeger, MD   . SKIN CANCER EXCISION     "top of my head"    Family History  Problem Relation Age of Onset  . Lung cancer Sister   . Heart attack Father     Allergies  Allergen Reactions  . Levaquin [Levofloxacin In D5w] Swelling    Eyes swollen  . Other Anaphylaxis    Bee sting    Current Outpatient Prescriptions on File Prior to Visit  Medication Sig Dispense Refill  . aspirin 81 MG tablet Take 81 mg by mouth daily.    Marland Kitchen atorvastatin (LIPITOR) 20 MG tablet TAKE 1 TABLET (20 MG TOTAL) BY MOUTH DAILY. 90 tablet 1  . EPINEPHrine (EPIPEN 2-PAK) 0.3 mg/0.3 mL IJ SOAJ injection Inject 0.3 mLs (0.3 mg total) into the  muscle once. 2 Device 0  . ketoconazole (NIZORAL) 2 % shampoo APPLY EVERY OTHER DAY TO SCALP--LATHER 5 MINUTES,THEN RINSE OUT 120 mL 5  . metoprolol tartrate (LOPRESSOR) 25 MG tablet TAKE 1 TABLET (25 MG TOTAL) BY MOUTH 2 (TWO) TIMES DAILY. 180 tablet 3  . nitroGLYCERIN (NITROSTAT) 0.4 MG SL tablet Place 1 tablet (0.4 mg total) under the tongue every 5 (five) minutes as needed for chest pain (3 doses max). 25 tablet 1   No current facility-administered medications on file prior to visit.     BP (!) 144/74 (BP Location: Right Arm, Patient Position: Sitting, Cuff Size: Normal)   Pulse 67   Temp 97.8 F (36.6 C) (Oral)   Ht 6' (1.829 m)   Wt 202 lb 9.6 oz (91.9 kg)   SpO2 98%   BMI 27.48 kg/m    Preventive Screening-Counseling & Management   Alcohol-Tobacco  Smoking Status: remote   Allergies (verified):  No Known Drug Allergies   Past History:  Past Medical History:  Colonic polyps, hx of  Coronary  artery disease Carotid artery disease  Past Surgical History:  status post partial colectomy and cholecystectomy, 1998 Dalbert Batman)  colonoscopy. 2009 Sharlett Iles)  history penile implant Rosana Hoes)   Family History:   father died age 34  mother died age 43  one sister died in her 28s due to lung cancer   Social History:    Married  Never Smoked ( no tobacco since teenage years)  6 children 36 grandchildren and 3 great-grandchildren  Smoking Status: never   Medicare wellness visit  1. Risk factors, based on past  M,S,F history.  Patient has known coronary artery disease.  Risk factors include hypertension and history of dyslipidemia   2.  Physical activities:  Fairly active no exercise limitations  3.  Depression/mood: no history of major depression  4.  Hearing: mild deficits only  5.  ADL's:independent  6.  Fall risk:moderate due to incomplete left foot drop  7.  Home safety:no problems identified  8.  Height weight, and visual acuity;height and weight stable no change in visual acuity 9.  Counseling:continue heart healthy diet and active lifestyle.  More regular exercise encouraged  10. Lab orders based on risk factors: we'll check screening lab including lipid profile.   11. Referral :nerve conduction studies.  Check B12 level  12. Care plan:continue efforts at aggressive risk factor modification   13. Cognitive assessment: alert and oriented with normal affect.  No cognitive dysfunction  14. Screening: Patient provided with a written and personalized 5-10 year screening schedule in the AVS.    15. Provider List Update: primary care cardiology, urology  and ophthalmology as well as dermatology     Review of Systems  Constitutional: Negative for appetite change, chills, fatigue and fever.  HENT: Negative for congestion, dental problem, ear pain, hearing loss, sore throat, tinnitus, trouble swallowing and voice change.   Eyes: Negative for pain, discharge and  visual disturbance.  Respiratory: Negative for cough, chest tightness, wheezing and stridor.   Cardiovascular: Negative for chest pain, palpitations and leg swelling.  Gastrointestinal: Negative for abdominal distention, abdominal pain, blood in stool, constipation, diarrhea, nausea and vomiting.  Genitourinary: Negative for difficulty urinating, discharge, flank pain, genital sores, hematuria and urgency.  Musculoskeletal: Positive for arthralgias and gait problem. Negative for back pain, joint swelling, myalgias and neck stiffness.  Skin: Negative for rash.  Neurological: Positive for dizziness, light-headedness and numbness. Negative for syncope, speech difficulty, weakness and headaches.  Hematological: Negative for adenopathy. Does not bruise/bleed easily.  Psychiatric/Behavioral: Negative for behavioral problems and dysphoric mood. The patient is not nervous/anxious.        Objective:   Physical Exam  Constitutional: He appears well-developed and well-nourished.  Blood pressure 130/74  HENT:  Head: Normocephalic and atraumatic.  Right Ear: External ear normal.  Left Ear: External ear normal.  Nose: Nose normal.  Mouth/Throat: Oropharynx is clear and moist.  Eyes: Conjunctivae and EOM are normal. Pupils are equal, round, and reactive to light. No scleral icterus.  Neck: Normal range of motion. Neck supple. No JVD present. No thyromegaly present.  Cardiovascular: Regular rhythm, normal heart sounds and intact distal pulses.  Exam reveals no gallop and no friction rub.   No murmur heard. Status post .  Sternotomy Dorsalis pedis pulses plus 3.  Posterior tibial pulses plus 2  Pulmonary/Chest: Effort normal and breath sounds normal. He exhibits no tenderness.  Abdominal: Soft. Bowel sounds are normal. He exhibits no distension and no mass. There is no tenderness.  Genitourinary: Prostate normal and penis normal.  Musculoskeletal: Normal range of motion. He exhibits no edema or  tenderness.  Lymphadenopathy:    He has no cervical adenopathy.  Neurological: He is alert. He has normal reflexes. No cranial nerve deficit. Coordination normal.  Decreased vibratory sensation and monofilament testing distal to the knees  Unable to dorsiflex left great toe Weakness, dorsiflexion left foot  Eversion left foot appeared to be intact  Skin: Skin is warm and dry. No rash noted.  Status post multiple skin cancer resections, most marked over the frontal scalp  Psychiatric: He has a normal mood and affect. His behavior is normal.          Assessment & Plan:   Preventive health Examination Subsequent Medicare wellness visit Coronary artery disease Peripheral neuropathy.  Will check B12 level Incomplete left foot drop.  We'll check nerve conduction studies Dyslipidemia.  Review lipid profile  Follow-up 3 months Cardiology and urology follow-up as scheduled  Nyoka Cowden

## 2016-10-15 NOTE — Patient Instructions (Signed)
Limit your sodium (Salt) intake    It is important that you exercise regularly, at least 20 minutes 3 to 4 times per week.  If you develop chest pain or shortness of breath seek  medical attention.  Urology and cardiology follow-up as scheduled

## 2016-10-15 NOTE — Progress Notes (Signed)
Pre visit review using our clinic review tool, if applicable. No additional management support is needed unless otherwise documented below in the visit note. 

## 2016-10-16 ENCOUNTER — Encounter: Payer: Self-pay | Admitting: Cardiovascular Disease

## 2016-10-16 ENCOUNTER — Ambulatory Visit (INDEPENDENT_AMBULATORY_CARE_PROVIDER_SITE_OTHER): Payer: Medicare Other | Admitting: Cardiovascular Disease

## 2016-10-16 VITALS — BP 120/70 | HR 54 | Ht 72.0 in | Wt 202.8 lb

## 2016-10-16 DIAGNOSIS — E782 Mixed hyperlipidemia: Secondary | ICD-10-CM

## 2016-10-16 DIAGNOSIS — I6523 Occlusion and stenosis of bilateral carotid arteries: Secondary | ICD-10-CM

## 2016-10-16 DIAGNOSIS — I2510 Atherosclerotic heart disease of native coronary artery without angina pectoris: Secondary | ICD-10-CM | POA: Diagnosis not present

## 2016-10-16 DIAGNOSIS — I2583 Coronary atherosclerosis due to lipid rich plaque: Secondary | ICD-10-CM

## 2016-10-16 NOTE — Patient Instructions (Signed)

## 2016-10-16 NOTE — Progress Notes (Signed)
Patient ID: Michael Johnston, male   DOB: 10-02-1931, 83 y.o.   MRN: [id number]   Michael Johnston is seen for F/U CAD SEMI after getting injections at the dentist office September 27, 2010. Subsequently found to have 3VD needing CABG:  Note the RCA was not grafted   1/12  CABG Left internal mammary artery to the distal LAD, coronary artery a  saphenous vein graft to the first diagonal branch, and a saphenous  vein graft to the first obtuse marginal branch. He had endoscopic  saphenous vein harvest from the right thigh. He tolerated  procedure well, was taken to the surgical intensive care unit in  stable condition. Operative findings showed normal left  ventricular function, good-quality conduits, good quality targets.   Duplex in 12/27/13  reviewed showed 40-59% bilateral carotid disease. 05/25/15 duplex read by Dr Leonie Man 1-39% ICA plaque no stenosis  F/U myovue 11/14 normal EF 71% Stopping statin made no difference with memory so he is back on it   10/18/14  LDL 51  with normal LFTls   Seen in hospital 05/2015 Syncope this was in setting of fasting and a wasp sting.    Echo reviewed:  ------------------------------------------------------------------- Study Conclusions  - Left ventricle: The cavity size was normal. Wall thickness was increased in a pattern of mild LVH. Systolic function was normal. The estimated ejection fraction was in the range of 55% to 60%. Wall motion was normal; there were no regional wall motion abnormalities. Doppler parameters are consistent with abnormal left ventricular relaxation (grade 1 diastolic dysfunction). The E/e&' ratio is between 8-15, suggesting indeterminate LV filling pressure. - Mitral valve: Calcified annulus. There was trivial regurgitation. - Left atrium: The atrium was normal in size. - Right atrium: The atrium was mildly dilated. - Tricuspid valve: There was trivial regurgitation. - Pulmonary arteries: PA peak pressure: 24 mm Hg  (S). - Inferior vena cava: The vessel was normal in size. The respirophasic diameter changes were in the normal range (>= 50%), consistent with normal central venous pressure.   ROS: Denies fever, malais, weight loss, blurry vision, decreased visual acuity, cough, sputum, SOB, hemoptysis, pleuritic pain, palpitaitons, heartburn, abdominal pain, melena, lower extremity edema, claudication, or rash.  All other systems reviewed and negative  General: Affect appropriate Healthy:  appears stated age 83: normal Neck supple with no adenopathy JVP normal no bruits no thyromegaly Lungs clear with no wheezing and good diaphragmatic motion Heart:  S1/S2 no murmur, no rub, gallop or click PMI normal Abdomen: benighn, BS positve, no tenderness, no AAA no bruit.  No HSM or HJR Distal pulses intact with no bruits No edema Neuro non-focal Skin warm and dry No muscular weakness   Current Outpatient Prescriptions  Medication Sig Dispense Refill  . aspirin 81 MG tablet Take 81 mg by mouth daily.    Marland Kitchen atorvastatin (LIPITOR) 20 MG tablet TAKE 1 TABLET (20 MG TOTAL) BY MOUTH DAILY. 90 tablet 1  . EPINEPHrine (EPIPEN 2-PAK) 0.3 mg/0.3 mL IJ SOAJ injection Inject 0.3 mLs (0.3 mg total) into the muscle once. 2 Device 0  . ketoconazole (NIZORAL) 2 % shampoo APPLY EVERY OTHER DAY TO SCALP--LATHER 5 MINUTES,THEN RINSE OUT 120 mL 5  . metFORMIN (GLUCOPHAGE-XR) 500 MG 24 hr tablet Take 2 tablets (1,000 mg total) by mouth daily with breakfast. 180 tablet 2  . metoprolol tartrate (LOPRESSOR) 25 MG tablet TAKE 1 TABLET (25 MG TOTAL) BY MOUTH 2 (TWO) TIMES DAILY. 180 tablet 3  . nitroGLYCERIN (NITROSTAT) 0.4 MG SL tablet  Place 1 tablet (0.4 mg total) under the tongue every 5 (five) minutes as needed for chest pain (3 doses max). 25 tablet 1   No current facility-administered medications for this visit.     Allergies  Levaquin [levofloxacin in d5w] and Other  Electrocardiogram:   01/26/14  SR rate 54  normal  01/26/15  SR rate 64  LAD LVH no significant change  10/16/16  SR ate 48 PAC LVH nonspecific ST changes   Assessment and Plan  Syncope: related to wasp sting Has Epi Pen now  CAD:  CABG 2012  Active with no angina continue medical Rx HTN:  Well controlled.  Continue current medications and low sodium Dash type diet.   Chol:   Cholesterol is at goal.  Continue current dose of statin and diet Rx.  No myalgias or side effects.  F/U  LFT's in 6 months. Lab Results  Component Value Date   LDLCALC 55 10/15/2016   DM:  Discussed low carb diet.  Target hemoglobin A1c is 6.5 or less.  Continue current medications.  Carotid:  Duplex 12/28/15 plaque no stenosis f/u 12/2017         F/U with me in a year   [name]

## 2016-10-17 ENCOUNTER — Encounter: Payer: Self-pay | Admitting: Internal Medicine

## 2016-10-17 ENCOUNTER — Ambulatory Visit (INDEPENDENT_AMBULATORY_CARE_PROVIDER_SITE_OTHER): Payer: Medicare Other

## 2016-10-17 DIAGNOSIS — E538 Deficiency of other specified B group vitamins: Secondary | ICD-10-CM

## 2016-10-17 MED ORDER — CYANOCOBALAMIN 1000 MCG/ML IJ SOLN
1000.0000 ug | Freq: Once | INTRAMUSCULAR | Status: AC
  Administered 2016-10-17: 1000 ug via INTRAMUSCULAR

## 2016-10-24 ENCOUNTER — Encounter: Payer: Medicare Other | Admitting: Internal Medicine

## 2016-10-24 ENCOUNTER — Other Ambulatory Visit (INDEPENDENT_AMBULATORY_CARE_PROVIDER_SITE_OTHER): Payer: Medicare Other

## 2016-10-24 ENCOUNTER — Encounter: Payer: Self-pay | Admitting: Internal Medicine

## 2016-10-24 DIAGNOSIS — E538 Deficiency of other specified B group vitamins: Secondary | ICD-10-CM

## 2016-10-24 MED ORDER — CYANOCOBALAMIN 1000 MCG/ML IJ SOLN
1000.0000 ug | Freq: Once | INTRAMUSCULAR | Status: AC
  Administered 2016-10-24: 1000 ug via INTRAMUSCULAR

## 2016-11-04 ENCOUNTER — Other Ambulatory Visit: Payer: Self-pay | Admitting: *Deleted

## 2016-11-04 DIAGNOSIS — M21372 Foot drop, left foot: Secondary | ICD-10-CM

## 2016-11-07 ENCOUNTER — Ambulatory Visit (INDEPENDENT_AMBULATORY_CARE_PROVIDER_SITE_OTHER): Payer: Medicare Other | Admitting: Neurology

## 2016-11-07 DIAGNOSIS — M21372 Foot drop, left foot: Secondary | ICD-10-CM

## 2016-11-07 DIAGNOSIS — G629 Polyneuropathy, unspecified: Secondary | ICD-10-CM

## 2016-11-07 NOTE — Procedures (Signed)
[facility]  [address], [address]  [place], [place] [address] Tel: [phone] Fax:  [phone] Test Date:  11/07/2016  Patient: Michael Johnston DOB: February 25, 1932 Physician: Narda Amber, DO  Sex: Male Height: 5\' 11"  Ref Phys: Royetta Car, M.D.  ID#: [id number] Temp: 33.4C Technician: Jerilynn Mages. Dean   Patient Complaints: This is a 83 year old gentleman referred for evaluation of bilateral feet numbness and left toe weakness.   NCV & EMG Findings: Extensive electrodiagnostic testing of the left lower extremity and additional studies of the left shows:  1. Bilateral sural and superficial peroneal sensory responses are absent. 2. Bilateral peroneal (EDB) and tibial motor responses are absent. Bilateral peroneal motor responses recording at the tibialis anterior show reduced amplitude, normal latency and normal conduction velocity. 3. Bilateral tibial H reflex studies are absent. 4. Chronic and severe motor axon loss changes are seen affecting bilateral tibialis anterior, flexor digitorum longus, and medial gastrocnemius muscles.  Sparse chronic motor axonal loss changes isolated to the rectus femoris muscle on the left. There is no evidence of accompanied active denervation.   Impression: The electrophysiologic findings are most consistent with a chronic, length dependent sensorimotor polyneuropathy, axon loss in type, affecting the lower extremities. Overall, these findings are severe in degree electrically.    In particular, there is no evidence of a left lumbosacral radiculopathy or peroneal mononeuropathy.    ___________________________ Narda Amber, DO    Nerve Conduction Studies Anti Sensory Summary Table   Stim Site NR Peak (ms) Norm Peak (ms) P-T Amp (V) Norm P-T Amp  Left Sup Peroneal Anti Sensory (Ant Lat Mall)  33.4C  12 cm NR  <4.6  >3  Right Sup Peroneal Anti Sensory (Ant Lat Mall)  33.4C  12 cm NR  <4.6  >3  Left Sural Anti Sensory (Lat Mall)   33.4C  Calf NR  <4.6  >3  Right Sural Anti Sensory (Lat Mall)  33.4C  Calf NR  <4.6  >3   Motor Summary Table   Stim Site NR Onset (ms) Norm Onset (ms) O-P Amp (mV) Norm O-P Amp Site1 Site2 Delta-0 (ms) Dist (cm) Vel (m/s) Norm Vel (m/s)  Left Peroneal Motor (Ext Dig Brev)  33.4C  Ankle NR  <6.0  >2.5 B Fib Ankle  0.0  >40  B Fib NR     Poplt B Fib  0.0  >40  Poplt NR            Right Peroneal Motor (Ext Dig Brev)  33.4C  Ankle NR  <6.0  >2.5 B Fib Ankle  0.0  >40  B Fib NR     Poplt B Fib  0.0  >40  Poplt NR            Left Peroneal TA Motor (Tib Ant)  33.4C  Fib Head    3.6 <4.5 2.1 >3 Poplit Fib Head 1.9 10.0 53 >40  Poplit    5.5  2.2         Right Peroneal TA Motor (Tib Ant)  33.4C  Fib Head    2.8 <4.5 2.5 >3 Poplit Fib Head 2.2 10.0 45 >40  Poplit    5.0  2.3         Left Tibial Motor (Abd Hall Brev)  33.4C  Ankle NR  <6.0  >4 Knee Ankle  0.0  >40  Knee NR            Right Tibial Motor (Abd Hall Brev)  33.4C  Ankle NR  <6.0  >4 Knee Ankle  0.0  >40  Knee NR             H Reflex Studies   NR H-Lat (ms) Lat Norm (ms) L-R H-Lat (ms) M-Lat (ms) HLat-MLat (ms)  Left Tibial (Gastroc)  33.4C     NR <35 1.09 6.53 35.78  Right Tibial (Gastroc)  33.4C     NR <35 1.09 6.53 36.87   EMG   Side Muscle Ins Act Fibs Psw Fasc Number Recrt Dur Dur. Amp Amp. Poly Poly. Comment  Right AntTibialis Nml Nml Nml Nml 2- Rapid Many 1+ Many 1+ Some 1+ N/A  Right Gastroc Nml Nml Nml Nml 2- Rapid Many 1+ Many 1+ Some 1+ N/A  Right RectFemoris Nml Nml Nml Nml Nml Nml Nml Nml Nml Nml Nml Nml N/A  Left AntTibialis Nml Nml Nml Nml 2- Rapid Many 1+ Many 1+ Many 1+ N/A  Left Gastroc Nml Nml Nml Nml 2- Rapid Many 1+ Many 1+ Some 1+ N/A  Left GluteusMed Nml Nml Nml Nml Nml Nml Nml Nml Nml Nml Nml Nml N/A  Left BicepsFemS Nml Nml Nml Nml Nml Nml Nml Nml Nml Nml Nml Nml N/A  Left Flex Dig Long Nml Nml Nml Nml 3- Rapid All 1+ All 1+ All 1+ N/A  Left RectFemoris Nml Nml Nml Nml 1- Mod-R Few  1+ Few 1+ Nml Nml N/A      Waveforms:

## 2016-11-08 ENCOUNTER — Ambulatory Visit (INDEPENDENT_AMBULATORY_CARE_PROVIDER_SITE_OTHER): Payer: Medicare Other | Admitting: Internal Medicine

## 2016-11-08 ENCOUNTER — Encounter: Payer: Self-pay | Admitting: Internal Medicine

## 2016-11-08 VITALS — BP 132/78 | HR 68 | Temp 97.5°F | Ht 72.0 in | Wt 194.2 lb

## 2016-11-08 DIAGNOSIS — E1149 Type 2 diabetes mellitus with other diabetic neurological complication: Secondary | ICD-10-CM | POA: Diagnosis not present

## 2016-11-08 DIAGNOSIS — Z8489 Family history of other specified conditions: Secondary | ICD-10-CM

## 2016-11-08 DIAGNOSIS — M25512 Pain in left shoulder: Secondary | ICD-10-CM | POA: Diagnosis not present

## 2016-11-08 DIAGNOSIS — Z8249 Family history of ischemic heart disease and other diseases of the circulatory system: Secondary | ICD-10-CM

## 2016-11-08 DIAGNOSIS — G608 Other hereditary and idiopathic neuropathies: Secondary | ICD-10-CM | POA: Insufficient documentation

## 2016-11-08 DIAGNOSIS — E538 Deficiency of other specified B group vitamins: Secondary | ICD-10-CM | POA: Diagnosis not present

## 2016-11-08 DIAGNOSIS — I2510 Atherosclerotic heart disease of native coronary artery without angina pectoris: Secondary | ICD-10-CM | POA: Diagnosis not present

## 2016-11-08 NOTE — Progress Notes (Signed)
Subjective:    Patient ID: Michael Johnston, male    DOB: 03/24/1932, 83 y.o.   MRN: [id number]  HPI  83 year old patient who was seen 1 month ago for a preventive health examination. At that time he had evidence of a peripheral neuropathy with marked decreased sensation distal to the knees.  He also had weakness of  dorsiflexion of the feet especially on the left side.   Subtotal evaluation reveals B12 deficiency and also diabetes with an elevated hemoglobin A1c.  He has received 4 weekly B12 injections over the past month and has been started on metformin therapy.  He had an episode of severe shoulder and upper back discomfort that he thought may be related to metformin therapy.  He has decreased the dose to 1 tablet daily.  He does have a history of coronary artery disease status post CABG.  The shoulder pain occurred approximately 2 weeks ago  Past Medical History:  Diagnosis Date  . Arthritis    "right shoulder" (05/24/2015)  . CHEST PAIN   . Chronic bronchitis ([place])    "get it ~ q yr" (05/24/2015)  . COLONIC POLYPS, HX OF   . COPD (chronic obstructive pulmonary disease) ([place])    "dx'd but I don't take RX for it" (05/24/2015)  . Coronary artery disease   . Cystic kidney disease   . GERD (gastroesophageal reflux disease)   . History of hiatal hernia   . MI (myocardial infarction) 09/2010  . Mixed hyperlipidemia   . Skin cancer    "top of my head only" (05/24/2015)  . Sleep apnea   . Syncope and collapse ~ 2013; 05/24/2015     Social History   Social History  . Marital status: Married    Spouse name: N/A  . Number of children: N/A  . Years of education: N/A   Occupational History  . Not on file.   Social History Main Topics  . Smoking status: Former Smoker    Packs/day: 1.50    Years: 3.00    Types: Cigarettes    Quit date: 12/09/1951  . Smokeless tobacco: Never Used  . Alcohol use Yes     Comment: "no alcohol since 1953"  . Drug use: No  . Sexual activity: Not on  file   Other Topics Concern  . Not on file   Social History Narrative  . No narrative on file    Past Surgical History:  Procedure Laterality Date  . CARDIAC CATHETERIZATION  09/2010  . CHOLECYSTECTOMY OPEN  1998  . COLECTOMY  1998   "partial"  . CORONARY ANGIOPLASTY    . CORONARY ARTERY BYPASS GRAFT  09/2010   Median sternotomy for coronary artery bypass grafting x3  (left internal mammary artery to distal left anterior  descending  coronary artery, saphenous vein graft to first diagonal branch,  saphenous vein graft to first  obtuse marginal branch, endoscopic  saphenous vein harvest from right thigh). SURGEON:  Valentina Gu.  Roxy Manns, MD  ASSISTANT:  John Giovanni, PA-C  ANESTHESIA:  Glynda Jaeger, MD   . SKIN CANCER EXCISION     "top of my head"    Family History  Problem Relation Age of Onset  . Lung cancer Sister   . Heart attack Father     Allergies  Allergen Reactions  . Levaquin [Levofloxacin In D5w] Swelling    Eyes swollen  . Other Anaphylaxis    Bee sting    Current Outpatient Prescriptions on File  Prior to Visit  Medication Sig Dispense Refill  . aspirin 81 MG tablet Take 81 mg by mouth daily.    Marland Kitchen atorvastatin (LIPITOR) 20 MG tablet TAKE 1 TABLET (20 MG TOTAL) BY MOUTH DAILY. 90 tablet 1  . EPINEPHrine (EPIPEN 2-PAK) 0.3 mg/0.3 mL IJ SOAJ injection Inject 0.3 mLs (0.3 mg total) into the muscle once. 2 Device 0  . ketoconazole (NIZORAL) 2 % shampoo APPLY EVERY OTHER DAY TO SCALP--LATHER 5 MINUTES,THEN RINSE OUT 120 mL 5  . metFORMIN (GLUCOPHAGE-XR) 500 MG 24 hr tablet Take 2 tablets (1,000 mg total) by mouth daily with breakfast. 180 tablet 2  . metoprolol tartrate (LOPRESSOR) 25 MG tablet TAKE 1 TABLET (25 MG TOTAL) BY MOUTH 2 (TWO) TIMES DAILY. 180 tablet 3  . nitroGLYCERIN (NITROSTAT) 0.4 MG SL tablet Place 1 tablet (0.4 mg total) under the tongue every 5 (five) minutes as needed for chest pain (3 doses max). 25 tablet 1   No current facility-administered  medications on file prior to visit.     BP 132/78 (BP Location: Left Arm, Patient Position: Sitting, Cuff Size: Normal)   Pulse 68   Temp 97.5 F (36.4 C) (Oral)   Ht 6' (1.829 m)   Wt 194 lb 3.2 oz (88.1 kg)   SpO2 98%   BMI 26.34 kg/m     Review of Systems  Musculoskeletal: Positive for arthralgias and back pain.  Neurological: Positive for numbness.       Objective:   Physical Exam  Constitutional: He appears well-developed and well-nourished. No distress.  Cardiovascular: Normal rate and regular rhythm.   Sternotomy scar          Assessment & Plan:   Episode of significant shoulder pain.  2 weeks ago.  Will review EKG and CK-MB New-onset diabetes with elevated hemoglobin A1c.  Patient has been eating much better.  Will increase metformin to 1 g daily.  Recheck hemoglobin A1c in 3 months Peripheral neuropathy.  Probably secondary to combination of both diabetes and B12 deficiency B12 deficiency.  Patient has received 4 weekly parenteral injections.  We'll continue monthly B12 injections  Follow-up 3 months  [id number],PETER Pilar Plate

## 2016-11-08 NOTE — Progress Notes (Signed)
Pre visit review using our clinic review tool, if applicable. No additional management support is needed unless otherwise documented below in the visit note. 

## 2016-11-08 NOTE — Patient Instructions (Signed)
Please check your hemoglobin A1c every 3 months    It is important that you exercise regularly, at least 20 minutes 3 to 4 times per week.  If you develop chest pain or shortness of breath seek  medical attention.  Return in 3 months for follow-up

## 2016-11-09 ENCOUNTER — Encounter (HOSPITAL_COMMUNITY): Payer: Self-pay | Admitting: *Deleted

## 2016-11-09 ENCOUNTER — Emergency Department (HOSPITAL_COMMUNITY)
Admission: EM | Admit: 2016-11-09 | Discharge: 2016-11-09 | Disposition: A | Discharge status: Home / Self Care | Payer: Medicare Other | Attending: Emergency Medicine | Admitting: Emergency Medicine

## 2016-11-09 ENCOUNTER — Emergency Department (HOSPITAL_COMMUNITY): Payer: Medicare Other

## 2016-11-09 ENCOUNTER — Telehealth: Payer: Self-pay | Admitting: Family Medicine

## 2016-11-09 DIAGNOSIS — J449 Chronic obstructive pulmonary disease, unspecified: Secondary | ICD-10-CM | POA: Diagnosis not present

## 2016-11-09 DIAGNOSIS — Z951 Presence of aortocoronary bypass graft: Secondary | ICD-10-CM | POA: Diagnosis not present

## 2016-11-09 DIAGNOSIS — M25511 Pain in right shoulder: Secondary | ICD-10-CM | POA: Diagnosis not present

## 2016-11-09 DIAGNOSIS — E1140 Type 2 diabetes mellitus with diabetic neuropathy, unspecified: Secondary | ICD-10-CM | POA: Insufficient documentation

## 2016-11-09 DIAGNOSIS — Z85828 Personal history of other malignant neoplasm of skin: Secondary | ICD-10-CM | POA: Diagnosis not present

## 2016-11-09 DIAGNOSIS — Z87891 Personal history of nicotine dependence: Secondary | ICD-10-CM | POA: Diagnosis not present

## 2016-11-09 DIAGNOSIS — Z7984 Long term (current) use of oral hypoglycemic drugs: Secondary | ICD-10-CM | POA: Insufficient documentation

## 2016-11-09 DIAGNOSIS — R072 Precordial pain: Secondary | ICD-10-CM | POA: Diagnosis not present

## 2016-11-09 DIAGNOSIS — M25512 Pain in left shoulder: Secondary | ICD-10-CM | POA: Insufficient documentation

## 2016-11-09 DIAGNOSIS — R079 Chest pain, unspecified: Secondary | ICD-10-CM

## 2016-11-09 DIAGNOSIS — I252 Old myocardial infarction: Secondary | ICD-10-CM | POA: Insufficient documentation

## 2016-11-09 DIAGNOSIS — Z7982 Long term (current) use of aspirin: Secondary | ICD-10-CM | POA: Diagnosis not present

## 2016-11-09 DIAGNOSIS — I2510 Atherosclerotic heart disease of native coronary artery without angina pectoris: Secondary | ICD-10-CM | POA: Diagnosis not present

## 2016-11-09 DIAGNOSIS — Z79899 Other long term (current) drug therapy: Secondary | ICD-10-CM | POA: Diagnosis not present

## 2016-11-09 LAB — CBC
HEMATOCRIT: 39.8 % (ref 39.0–52.0)
Hemoglobin: 13.4 g/dL (ref 13.0–17.0)
MCH: 32.6 pg (ref 26.0–34.0)
MCHC: 33.7 g/dL (ref 30.0–36.0)
MCV: 96.8 fL (ref 78.0–100.0)
Platelets: 141 10*3/uL — ABNORMAL LOW (ref 150–400)
RBC: 4.11 MIL/uL — AB (ref 4.22–5.81)
RDW: 13.4 % (ref 11.5–15.5)
WBC: 5.1 10*3/uL (ref 4.0–10.5)

## 2016-11-09 LAB — BASIC METABOLIC PANEL
Anion gap: 8 (ref 5–15)
BUN: 21 mg/dL — ABNORMAL HIGH (ref 6–20)
CHLORIDE: 104 mmol/L (ref 101–111)
CO2: 27 mmol/L (ref 22–32)
Calcium: 9.7 mg/dL (ref 8.9–10.3)
Creatinine, Ser: 0.81 mg/dL (ref 0.61–1.24)
GFR calc non Af Amer: >60 mL/min (ref >60)
Glucose, Bld: 112 mg/dL — ABNORMAL HIGH (ref 65–99)
POTASSIUM: 4.2 mmol/L (ref 3.5–5.1)
SODIUM: 139 mmol/L (ref 135–145)

## 2016-11-09 LAB — CK TOTAL AND CKMB (NOT AT ARMC)
CK TOTAL: 171 U/L (ref 7–232)
CK, MB: 6.3 ng/mL — ABNORMAL HIGH (ref 0.0–5.0)
RELATIVE INDEX: 3.7 (ref 0.0–4.0)

## 2016-11-09 LAB — I-STAT TROPONIN, ED: Troponin i, poc: 0 ng/mL (ref 0.00–0.08)

## 2016-11-09 LAB — TROPONIN I: Troponin I: <0.03 ng/mL (ref <0.03)

## 2016-11-09 MED ORDER — ASPIRIN 81 MG PO CHEW
324.0000 mg | CHEWABLE_TABLET | Freq: Once | ORAL | Status: AC
  Administered 2016-11-09: 243 mg via ORAL
  Filled 2016-11-09: qty 4

## 2016-11-09 NOTE — ED Triage Notes (Signed)
Pt c/o L sided CP that radiates to L arm onset x 2 wks, pt seen at PCP office for EKG & cardiac enzymes were drawn, pt called today & told his cardiac enzymes were elevated & told to come here today, pt c/o weakness, pt reports recently starting Metformin, pt denies SOB, n/v/d, A&O x4

## 2016-11-09 NOTE — ED Provider Notes (Signed)
[place] DEPT Provider Note   CSN: [id number] Arrival date & time: 11/09/16  1115     History   Chief Complaint Chief Complaint  Patient presents with  . Chest Pain    HPI Michael Johnston is a 83 y.o. male.  The history is provided by the patient.  Chest Pain   This is a new problem. Episode onset: multiple months ago. The problem occurs rarely. The problem has been resolved. The pain is present in the substernal region. The pain is mild. Quality: "feels like gas" The pain does not radiate. Pertinent negatives include no abdominal pain, no diaphoresis, no fever, no nausea, no shortness of breath and no vomiting. He has tried nothing for the symptoms. Risk factors include obesity, male gender and being elderly.  Shoulder Pain   This is a new problem. Episode onset: 2 weeks ago. Pain location: both shoulders L>R. The quality of the pain is described as intermittent. Associated symptoms include stiffness ("tightness"). He has tried nothing for the symptoms.    Past Medical History:  Diagnosis Date  . Arthritis    "right shoulder" (05/24/2015)  . CHEST PAIN   . Chronic bronchitis ([place])    "get it ~ q yr" (05/24/2015)  . COLONIC POLYPS, HX OF   . COPD (chronic obstructive pulmonary disease) ([place])    "dx'd but I don't take RX for it" (05/24/2015)  . Coronary artery disease   . Cystic kidney disease   . GERD (gastroesophageal reflux disease)   . History of hiatal hernia   . MI (myocardial infarction) 09/2010  . Mixed hyperlipidemia   . Skin cancer    "top of my head only" (05/24/2015)  . Sleep apnea   . Syncope and collapse ~ 2013; 05/24/2015    Patient Active Problem List   Diagnosis Date Noted  . Polyneuropathy, peripheral sensorimotor axonal 11/08/2016  . B12 deficiency 11/08/2016  . Type 2 diabetes mellitus with neurological complications ([place]) 123XX123  . Decreased sensation of lower extremity 05/24/2015  . Syncope and collapse 05/24/2015  . Carotid artery  stenosis 05/22/2012  . CAD (coronary artery disease) 05/14/2012  . FH: CABG (coronary artery bypass surgery) 01/28/2011  . Mixed hyperlipidemia 10/23/2010  . GERD (gastroesophageal reflux disease) 10/22/2010  . History of colonic polyps 06/14/2010    Past Surgical History:  Procedure Laterality Date  . CARDIAC CATHETERIZATION  09/2010  . CHOLECYSTECTOMY OPEN  1998  . COLECTOMY  1998   "partial"  . CORONARY ANGIOPLASTY    . CORONARY ARTERY BYPASS GRAFT  09/2010   Median sternotomy for coronary artery bypass grafting x3  (left internal mammary artery to distal left anterior  descending  coronary artery, saphenous vein graft to first diagonal branch,  saphenous vein graft to first  obtuse marginal branch, endoscopic  saphenous vein harvest from right thigh). SURGEON:  Valentina Gu.  Roxy Manns, MD  ASSISTANT:  John Giovanni, PA-C  ANESTHESIA:  Glynda Jaeger, MD   . SKIN CANCER EXCISION     "top of my head"       Home Medications    Prior to Admission medications   Medication Sig Start Date End Date Taking? Authorizing Provider  aspirin 81 MG tablet Take 81 mg by mouth daily.   Yes Historical Provider, MD  atorvastatin (LIPITOR) 20 MG tablet TAKE 1 TABLET (20 MG TOTAL) BY MOUTH DAILY. 07/29/16  Yes Marletta Lor, MD  cyanocobalamin (,VITAMIN B-12,) 1000 MCG/ML injection INJECT 1ML ONCE A MONTH THEREAFTER 10/24/16  Yes Historical Provider, MD  ketoconazole (NIZORAL) 2 % shampoo APPLY EVERY OTHER DAY TO SCALP--LATHER 5 MINUTES,THEN RINSE OUT 03/29/16  Yes Marletta Lor, MD  metFORMIN (GLUCOPHAGE-XR) 500 MG 24 hr tablet Take 2 tablets (1,000 mg total) by mouth daily with breakfast. Patient taking differently: Take 500 mg by mouth daily with breakfast.  10/15/16  Yes Marletta Lor, MD  metoprolol tartrate (LOPRESSOR) 25 MG tablet TAKE 1 TABLET (25 MG TOTAL) BY MOUTH 2 (TWO) TIMES DAILY. 08/05/16  Yes Marletta Lor, MD  EPINEPHrine (EPIPEN 2-PAK) 0.3 mg/0.3 mL IJ SOAJ injection  Inject 0.3 mLs (0.3 mg total) into the muscle once. 05/25/15   Geradine Girt, DO  nitroGLYCERIN (NITROSTAT) 0.4 MG SL tablet Place 1 tablet (0.4 mg total) under the tongue every 5 (five) minutes as needed for chest pain (3 doses max). 01/31/16   Josue Hector, MD    Family History Family History  Problem Relation Age of Onset  . Lung cancer Sister   . Heart attack Father     Social History Social History  Substance Use Topics  . Smoking status: Former Smoker    Packs/day: 1.50    Years: 3.00    Types: Cigarettes    Quit date: 12/09/1951  . Smokeless tobacco: Never Used  . Alcohol use Yes     Comment: "no alcohol since 1953"     Allergies   Levaquin [levofloxacin in d5w] and Other   Review of Systems Review of Systems  Constitutional: Negative for diaphoresis and fever.  Respiratory: Negative for shortness of breath.   Cardiovascular: Positive for chest pain.  Gastrointestinal: Negative for abdominal pain, nausea and vomiting.  Musculoskeletal: Positive for stiffness ("tightness").  All other systems reviewed and are negative.    Physical Exam Updated Vital Signs BP 139/72   Pulse (!) 59   Temp 97.7 F (36.5 C) (Oral)   Resp 20   Ht 6' (1.829 m)   Wt 194 lb (88 kg)   SpO2 97%   BMI 26.31 kg/m   Physical Exam  Constitutional: He is oriented to person, place, and time. He appears well-developed and well-nourished. No distress.  HENT:  Head: Normocephalic and atraumatic.  Nose: Nose normal.  Eyes: Conjunctivae are normal.  Neck: Neck supple. No tracheal deviation present.  Cardiovascular: Normal rate, regular rhythm and normal heart sounds.   Pulmonary/Chest: Effort normal and breath sounds normal. No respiratory distress. He exhibits no tenderness.  Abdominal: Soft. He exhibits no distension. There is no tenderness.  Neurological: He is alert and oriented to person, place, and time.  Skin: Skin is warm and dry.  Psychiatric: He has a normal mood and affect.   Vitals reviewed.    ED Treatments / Results  Labs (all labs ordered are listed, but only abnormal results are displayed) Labs Reviewed  BASIC METABOLIC PANEL - Abnormal; Notable for the following:       Result Value   Glucose, Bld 112 (*)    BUN 21 (*)    All other components within normal limits  CBC - Abnormal; Notable for the following:    RBC 4.11 (*)    Platelets 141 (*)    All other components within normal limits  TROPONIN I  I-STAT TROPOININ, ED    EKG  EKG Interpretation  Date/Time:  Saturday November 09 2016 11:23:04 EST Ventricular Rate:  63 PR Interval:  158 QRS Duration: 104 QT Interval:  406 QTC Calculation: 415 R Axis:   -  25 Text Interpretation:  Normal sinus rhythm Incomplete right bundle branch block Moderate voltage criteria for LVH, may be normal variant Borderline ECG No significant change since last tracing Confirmed by Yazir Koerber MD, Roshawn Lacina [phone]) on 11/09/2016 11:25:48 AM       Radiology Dg Chest 2 View  Result Date: 11/09/2016 CLINICAL DATA:  Chest pain. EXAM: CHEST  2 VIEW COMPARISON:  Radiographs of May 24, 2015. FINDINGS: The heart size and mediastinal contours are within normal limits. Both lungs are clear. Status post coronary artery bypass graft. No pneumothorax or pleural effusion is noted. The visualized skeletal structures are unremarkable. IMPRESSION: No active cardiopulmonary disease. Electronically Signed   By: Marijo Conception, M.D.   On: 11/09/2016 12:01    Procedures Procedures (including critical care time)  Medications Ordered in ED Medications  aspirin chewable tablet 324 mg (243 mg Oral Given 11/09/16 1209)     Initial Impression / Assessment and Plan / ED Course  I have reviewed the triage vital signs and the nursing notes.  Pertinent labs & imaging results that were available during my care of the patient were reviewed by me and considered in my medical decision making (see chart for details).     83 y.o. male presents  with elevated CK-MB levels on office visit yesterday. Had chest pain 2 months ago that has completely resolved. Ongoing left, sharp shoulder pain that is highly atypical for ACS and is also present on right shoulder. EKG unchanged from prior. Troponin negative despite ongoing symptoms. Plan to follow up with PCP as needed and return precautions discussed for worsening or new concerning symptoms.   Final Clinical Impressions(s) / ED Diagnoses   Final diagnoses:  Bilateral shoulder pain, unspecified chronicity  Nonspecific chest pain    New Prescriptions New Prescriptions   No medications on file     Leo Grosser, MD 11/09/16 1719

## 2016-11-09 NOTE — Telephone Encounter (Signed)
Received call from team health RN regarding critical lab result. CK-MB is elevated. Chart reviewed. It appears this was obtained given an episode of significant shoulder pain in a patient with cardiac disease. I spoke with the patient. He notes no chest pain or shortness of breath though does note continued shoulder tightness that is somewhat improved. Given isolated elevation of CK MB in a patient with continued symptoms and cardiac history I advised that he be evaluated as soon as possible in the emergency room for further lab work and repeat evaluation to evaluate for a cardiac component. He voiced understanding and noted he would be evaluated.

## 2016-11-20 DIAGNOSIS — L814 Other melanin hyperpigmentation: Secondary | ICD-10-CM | POA: Diagnosis not present

## 2016-11-20 DIAGNOSIS — D1801 Hemangioma of skin and subcutaneous tissue: Secondary | ICD-10-CM | POA: Diagnosis not present

## 2016-11-20 DIAGNOSIS — L570 Actinic keratosis: Secondary | ICD-10-CM | POA: Diagnosis not present

## 2016-11-20 DIAGNOSIS — Z85828 Personal history of other malignant neoplasm of skin: Secondary | ICD-10-CM | POA: Diagnosis not present

## 2016-11-20 DIAGNOSIS — L821 Other seborrheic keratosis: Secondary | ICD-10-CM | POA: Diagnosis not present

## 2016-12-20 DIAGNOSIS — N281 Cyst of kidney, acquired: Secondary | ICD-10-CM | POA: Diagnosis not present

## 2016-12-20 DIAGNOSIS — N401 Enlarged prostate with lower urinary tract symptoms: Secondary | ICD-10-CM | POA: Diagnosis not present

## 2016-12-20 DIAGNOSIS — N138 Other obstructive and reflux uropathy: Secondary | ICD-10-CM | POA: Diagnosis not present

## 2017-02-05 ENCOUNTER — Other Ambulatory Visit: Payer: Self-pay | Admitting: Internal Medicine

## 2017-02-18 ENCOUNTER — Encounter: Payer: Self-pay | Admitting: Internal Medicine

## 2017-02-18 ENCOUNTER — Ambulatory Visit (INDEPENDENT_AMBULATORY_CARE_PROVIDER_SITE_OTHER): Payer: Medicare Other | Admitting: Internal Medicine

## 2017-02-18 VITALS — BP 128/72 | HR 58 | Temp 97.8°F | Ht 72.0 in | Wt 189.0 lb

## 2017-02-18 DIAGNOSIS — E538 Deficiency of other specified B group vitamins: Secondary | ICD-10-CM | POA: Diagnosis not present

## 2017-02-18 DIAGNOSIS — I2510 Atherosclerotic heart disease of native coronary artery without angina pectoris: Secondary | ICD-10-CM | POA: Diagnosis not present

## 2017-02-18 DIAGNOSIS — E782 Mixed hyperlipidemia: Secondary | ICD-10-CM | POA: Diagnosis not present

## 2017-02-18 DIAGNOSIS — E1149 Type 2 diabetes mellitus with other diabetic neurological complication: Secondary | ICD-10-CM | POA: Diagnosis not present

## 2017-02-18 LAB — POCT GLYCOSYLATED HEMOGLOBIN (HGB A1C): HEMOGLOBIN A1C: 6.2

## 2017-02-18 NOTE — Progress Notes (Signed)
Subjective:    Patient ID: Michael Johnston, male    DOB: Jan 14, 1932, 83 y.o.   MRN: [id number]  HPI  Lab Results  Component Value Date   HGBA1C 7.8 (H) 10/15/2016   83 year old patient who has a long history of impaired glucose tolerance.  He is noted have a elevated hemoglobin A1c and now is on metformin therapy since February of this year.  He has tolerated this medication well. He was also diagnosed with B12 deficiency and remains on monthly parenteral injections. He is doing quite well without concerns or complaints. He has coronary artery disease as well as carotid artery disease and denies any cardiopulmonary, or focal neurological symptoms  Past Medical History:  Diagnosis Date  . Arthritis    "right shoulder" (05/24/2015)  . CHEST PAIN   . Chronic bronchitis ([place])    "get it ~ q yr" (05/24/2015)  . COLONIC POLYPS, HX OF   . COPD (chronic obstructive pulmonary disease) ([place])    "dx'd but I don't take RX for it" (05/24/2015)  . Coronary artery disease   . Cystic kidney disease   . GERD (gastroesophageal reflux disease)   . History of hiatal hernia   . MI (myocardial infarction) ([place]) 09/2010  . Mixed hyperlipidemia   . Skin cancer    "top of my head only" (05/24/2015)  . Sleep apnea   . Syncope and collapse ~ 2013; 05/24/2015     Social History   Social History  . Marital status: Married    Spouse name: N/A  . Number of children: N/A  . Years of education: N/A   Occupational History  . Not on file.   Social History Main Topics  . Smoking status: Former Smoker    Packs/day: 1.50    Years: 3.00    Types: Cigarettes    Quit date: 12/09/1951  . Smokeless tobacco: Never Used  . Alcohol use Yes     Comment: "no alcohol since 1953"  . Drug use: No  . Sexual activity: Not on file   Other Topics Concern  . Not on file   Social History Narrative  . No narrative on file    Past Surgical History:  Procedure Laterality Date  . CARDIAC CATHETERIZATION  09/2010   . CHOLECYSTECTOMY OPEN  1998  . COLECTOMY  1998   "partial"  . CORONARY ANGIOPLASTY    . CORONARY ARTERY BYPASS GRAFT  09/2010   Median sternotomy for coronary artery bypass grafting x3  (left internal mammary artery to distal left anterior  descending  coronary artery, saphenous vein graft to first diagonal branch,  saphenous vein graft to first  obtuse marginal branch, endoscopic  saphenous vein harvest from right thigh). SURGEON:  Valentina Gu.  Roxy Manns, MD  ASSISTANT:  John Giovanni, PA-C  ANESTHESIA:  Glynda Jaeger, MD   . SKIN CANCER EXCISION     "top of my head"    Family History  Problem Relation Age of Onset  . Lung cancer Sister   . Heart attack Father     Allergies  Allergen Reactions  . Levaquin [Levofloxacin In D5w] Swelling and Other (See Comments)    Eyes swollen  . Other Anaphylaxis and Other (See Comments)    Bee sting    Current Outpatient Prescriptions on File Prior to Visit  Medication Sig Dispense Refill  . aspirin 81 MG tablet Take 81 mg by mouth daily.    Marland Kitchen atorvastatin (LIPITOR) 20 MG tablet TAKE 1 TABLET (  20 MG TOTAL) BY MOUTH DAILY. 90 tablet 1  . cyanocobalamin (,VITAMIN B-12,) 1000 MCG/ML injection INJECT 1ML ONCE A MONTH THEREAFTER  1  . EPINEPHrine (EPIPEN 2-PAK) 0.3 mg/0.3 mL IJ SOAJ injection Inject 0.3 mLs (0.3 mg total) into the muscle once. 2 Device 0  . ketoconazole (NIZORAL) 2 % shampoo APPLY EVERY OTHER DAY TO SCALP--LATHER 5 MINUTES,THEN RINSE OUT 120 mL 5  . metFORMIN (GLUCOPHAGE-XR) 500 MG 24 hr tablet Take 2 tablets (1,000 mg total) by mouth daily with breakfast. (Patient taking differently: Take 500 mg by mouth daily with breakfast. ) 180 tablet 2  . metoprolol tartrate (LOPRESSOR) 25 MG tablet TAKE 1 TABLET (25 MG TOTAL) BY MOUTH 2 (TWO) TIMES DAILY. 180 tablet 3  . nitroGLYCERIN (NITROSTAT) 0.4 MG SL tablet Place 1 tablet (0.4 mg total) under the tongue every 5 (five) minutes as needed for chest pain (3 doses max). 25 tablet 1   No current  facility-administered medications on file prior to visit.     BP 128/72 (BP Location: Left Arm, Patient Position: Sitting, Cuff Size: Normal)   Pulse (!) 58   Temp 97.8 F (36.6 C) (Oral)   Ht 6' (1.829 m)   Wt 189 lb (85.7 kg)   SpO2 98%   BMI 25.63 kg/m     Review of Systems  Constitutional: Negative for appetite change, chills, fatigue and fever.  HENT: Negative for congestion, dental problem, ear pain, hearing loss, sore throat, tinnitus, trouble swallowing and voice change.   Eyes: Negative for pain, discharge and visual disturbance.  Respiratory: Negative for cough, chest tightness, wheezing and stridor.   Cardiovascular: Negative for chest pain, palpitations and leg swelling.  Gastrointestinal: Negative for abdominal distention, abdominal pain, blood in stool, constipation, diarrhea, nausea and vomiting.  Genitourinary: Negative for difficulty urinating, discharge, flank pain, genital sores, hematuria and urgency.  Musculoskeletal: Negative for arthralgias, back pain, gait problem, joint swelling, myalgias and neck stiffness.  Skin: Negative for rash.  Neurological: Negative for dizziness, syncope, speech difficulty, weakness, numbness and headaches.  Hematological: Negative for adenopathy. Does not bruise/bleed easily.  Psychiatric/Behavioral: Negative for behavioral problems and dysphoric mood. The patient is not nervous/anxious.        Objective:   Physical Exam  Constitutional: He is oriented to person, place, and time. He appears well-developed.  Blood pressure well controlled  HENT:  Head: Normocephalic.  Right Ear: External ear normal.  Left Ear: External ear normal.  Eyes: Conjunctivae and EOM are normal.  Neck: Normal range of motion.  Cardiovascular: Normal rate and normal heart sounds.   Pulmonary/Chest: Breath sounds normal.  Abdominal: Bowel sounds are normal.  Musculoskeletal: Normal range of motion. He exhibits no edema or tenderness.  Neurological:  He is alert and oriented to person, place, and time.  Psychiatric: He has a normal mood and affect. His behavior is normal.          Assessment & Plan:   Diabetes mellitus type 2.  Hemoglobin A1c today 6.2.  Will continue metformin therapy and recheck in 6 months Vitamin B12 deficiency.  Compliance with long-term/indefinite E 12 injections discussed Coronary artery disease, stable Dyslipidemia.  Continue statin therapy  Follow-up 6 months  Skyann Ganim Pilar Plate

## 2017-02-18 NOTE — Patient Instructions (Signed)
Limit your sodium (Salt) intake   Please check your hemoglobin A1c every 3-6  months  Return in 6 months for follow-up  

## 2017-02-26 DIAGNOSIS — L814 Other melanin hyperpigmentation: Secondary | ICD-10-CM | POA: Diagnosis not present

## 2017-02-26 DIAGNOSIS — Z85828 Personal history of other malignant neoplasm of skin: Secondary | ICD-10-CM | POA: Diagnosis not present

## 2017-02-26 DIAGNOSIS — L570 Actinic keratosis: Secondary | ICD-10-CM | POA: Diagnosis not present

## 2017-02-26 DIAGNOSIS — L821 Other seborrheic keratosis: Secondary | ICD-10-CM | POA: Diagnosis not present

## 2017-02-26 DIAGNOSIS — D1801 Hemangioma of skin and subcutaneous tissue: Secondary | ICD-10-CM | POA: Diagnosis not present

## 2017-03-27 ENCOUNTER — Ambulatory Visit (INDEPENDENT_AMBULATORY_CARE_PROVIDER_SITE_OTHER)
Admission: RE | Admit: 2017-03-27 | Discharge: 2017-03-27 | Disposition: A | Discharge status: Home / Self Care | Payer: Medicare Other | Source: Ambulatory Visit | Attending: Family Medicine | Admitting: Family Medicine

## 2017-03-27 ENCOUNTER — Encounter: Payer: Self-pay | Admitting: Family Medicine

## 2017-03-27 ENCOUNTER — Ambulatory Visit (INDEPENDENT_AMBULATORY_CARE_PROVIDER_SITE_OTHER): Payer: Medicare Other | Admitting: Family Medicine

## 2017-03-27 VITALS — BP 148/78 | HR 67 | Temp 99.2°F | Ht 72.0 in | Wt 184.0 lb

## 2017-03-27 DIAGNOSIS — J181 Lobar pneumonia, unspecified organism: Secondary | ICD-10-CM

## 2017-03-27 DIAGNOSIS — J189 Pneumonia, unspecified organism: Secondary | ICD-10-CM

## 2017-03-27 DIAGNOSIS — R05 Cough: Secondary | ICD-10-CM | POA: Diagnosis not present

## 2017-03-27 MED ORDER — AMOXICILLIN-POT CLAVULANATE 875-125 MG PO TABS: 1.0000 | ORAL_TABLET | Freq: Two times a day (BID) | ORAL | 0 refills | Status: DC

## 2017-03-27 MED ORDER — CEFTRIAXONE SODIUM 1 G IJ SOLR
1.0000 g | Freq: Once | INTRAMUSCULAR | Status: AC
  Administered 2017-03-27: 1 g via INTRAMUSCULAR

## 2017-03-27 NOTE — Progress Notes (Signed)
   Subjective:    Patient ID: Michael Johnston, male    DOB: [birth year], 83 y.o.   MRN: [id number]  HPI Here for 4 days of a cough producing yellow sputum and mild SOB. No chest pain. Using Mucinex and Robitussin.    Review of Systems  Constitutional: Negative.   HENT: Negative.   Eyes: Negative.   Respiratory: Positive for cough, chest tightness, shortness of breath and wheezing.   Cardiovascular: Negative.        Objective:   Physical Exam  Constitutional: He is oriented to person, place, and time. He appears well-developed and well-nourished. No distress.  HENT:  Right Ear: External ear normal.  Left Ear: External ear normal.  Nose: Nose normal.  Mouth/Throat: Oropharynx is clear and moist.  Eyes: Conjunctivae are normal.  Neck: No thyromegaly present.  Cardiovascular: Normal rate, regular rhythm, normal heart sounds and intact distal pulses.   Pulmonary/Chest: Effort normal. He has no wheezes.  Rales are present in the right posterior base   Lymphadenopathy:    He has no cervical adenopathy.  Neurological: He is alert and oriented to person, place, and time.          Assessment & Plan:  RLL pneumonia. Given a shot of Rocephin. Follow this with 10 days of Augmentin. Sent for a CXR this afternoon.  Alysia Penna, MD

## 2017-03-27 NOTE — Addendum Note (Signed)
Addended by: Aggie Hacker A on: 03/27/2017 12:28 PM   Modules accepted: Orders

## 2017-03-27 NOTE — Patient Instructions (Signed)
WE NOW OFFER   [facility] FAST TRACK!!!  SAME DAY Appointments for ACUTE CARE  Such as: Sprains, Injuries, cuts, abrasions, rashes, muscle pain, joint pain, back pain Colds, flu, sore throats, headache, allergies, cough, fever  Ear pain, sinus and eye infections Abdominal pain, nausea, vomiting, diarrhea, upset stomach Animal/insect bites  3 Easy Ways to Schedule: Walk-In Scheduling Call in scheduling Mychart Sign-up: [email or web address]         

## 2017-04-08 ENCOUNTER — Ambulatory Visit (INDEPENDENT_AMBULATORY_CARE_PROVIDER_SITE_OTHER): Payer: Medicare Other | Admitting: Internal Medicine

## 2017-04-08 ENCOUNTER — Encounter: Payer: Self-pay | Admitting: Internal Medicine

## 2017-04-08 VITALS — BP 132/74 | HR 64 | Temp 97.8°F | Ht 72.0 in | Wt 186.6 lb

## 2017-04-08 DIAGNOSIS — I2510 Atherosclerotic heart disease of native coronary artery without angina pectoris: Secondary | ICD-10-CM | POA: Diagnosis not present

## 2017-04-08 DIAGNOSIS — E1149 Type 2 diabetes mellitus with other diabetic neurological complication: Secondary | ICD-10-CM

## 2017-04-08 DIAGNOSIS — E538 Deficiency of other specified B group vitamins: Secondary | ICD-10-CM

## 2017-04-08 NOTE — Progress Notes (Signed)
Subjective:    Patient ID: Michael Johnston, male    DOB: May 29, 1932, 83 y.o.   MRN: [id number]  HPI  83 year old patient who is seen today in follow-up after treatment for suspected right lower lobe pneumonia. Chest x-ray revealed mild elevation left hemidiaphragm but no active infiltrate. He has completed 10 days of antibiotic therapy and today feels quite well.  He has some very mild residual congestion, but no significant cough.  He has resumed his usual activities.  No fever Does have a history of type 2 diabetes which has been well controlled  Past Medical History:  Diagnosis Date  . Arthritis    "right shoulder" (05/24/2015)  . CHEST PAIN   . Chronic bronchitis ([place])    "get it ~ q yr" (05/24/2015)  . COLONIC POLYPS, HX OF   . COPD (chronic obstructive pulmonary disease) ([place])    "dx'd but I don't take RX for it" (05/24/2015)  . Coronary artery disease   . Cystic kidney disease   . GERD (gastroesophageal reflux disease)   . History of hiatal hernia   . MI (myocardial infarction) ([place]) 09/2010  . Mixed hyperlipidemia   . Skin cancer    "top of my head only" (05/24/2015)  . Sleep apnea   . Syncope and collapse ~ 2013; 05/24/2015     Social History   Social History  . Marital status: Married    Spouse name: N/A  . Number of children: N/A  . Years of education: N/A   Occupational History  . Not on file.   Social History Main Topics  . Smoking status: Former Smoker    Packs/day: 1.50    Years: 3.00    Types: Cigarettes    Quit date: 12/09/1951  . Smokeless tobacco: Never Used  . Alcohol use Yes     Comment: "no alcohol since 1953"  . Drug use: No  . Sexual activity: Not on file   Other Topics Concern  . Not on file   Social History Narrative  . No narrative on file    Past Surgical History:  Procedure Laterality Date  . CARDIAC CATHETERIZATION  09/2010  . CHOLECYSTECTOMY OPEN  1998  . COLECTOMY  1998   "partial"  . CORONARY ANGIOPLASTY    . CORONARY  ARTERY BYPASS GRAFT  09/2010   Median sternotomy for coronary artery bypass grafting x3  (left internal mammary artery to distal left anterior  descending  coronary artery, saphenous vein graft to first diagonal branch,  saphenous vein graft to first  obtuse marginal branch, endoscopic  saphenous vein harvest from right thigh). SURGEON:  Valentina Gu.  Roxy Manns, MD  ASSISTANT:  John Giovanni, PA-C  ANESTHESIA:  Glynda Jaeger, MD   . SKIN CANCER EXCISION     "top of my head"    Family History  Problem Relation Age of Onset  . Lung cancer Sister   . Heart attack Father     Allergies  Allergen Reactions  . Levaquin [Levofloxacin In D5w] Swelling and Other (See Comments)    Eyes swollen  . Other Anaphylaxis and Other (See Comments)    Bee sting    Current Outpatient Prescriptions on File Prior to Visit  Medication Sig Dispense Refill  . aspirin 81 MG tablet Take 81 mg by mouth daily.    Marland Kitchen atorvastatin (LIPITOR) 20 MG tablet TAKE 1 TABLET (20 MG TOTAL) BY MOUTH DAILY. 90 tablet 1  . cyanocobalamin (,VITAMIN B-12,) 1000 MCG/ML injection INJECT  1ML ONCE A MONTH THEREAFTER  1  . EPINEPHrine (EPIPEN 2-PAK) 0.3 mg/0.3 mL IJ SOAJ injection Inject 0.3 mLs (0.3 mg total) into the muscle once. 2 Device 0  . ketoconazole (NIZORAL) 2 % shampoo APPLY EVERY OTHER DAY TO SCALP--LATHER 5 MINUTES,THEN RINSE OUT 120 mL 5  . metFORMIN (GLUCOPHAGE-XR) 500 MG 24 hr tablet Take 2 tablets (1,000 mg total) by mouth daily with breakfast. (Patient taking differently: Take 500 mg by mouth daily with breakfast. ) 180 tablet 2  . metoprolol tartrate (LOPRESSOR) 25 MG tablet TAKE 1 TABLET (25 MG TOTAL) BY MOUTH 2 (TWO) TIMES DAILY. 180 tablet 3  . nitroGLYCERIN (NITROSTAT) 0.4 MG SL tablet Place 1 tablet (0.4 mg total) under the tongue every 5 (five) minutes as needed for chest pain (3 doses max). 25 tablet 1   No current facility-administered medications on file prior to visit.     BP 132/74 (BP Location: Left Arm,  Patient Position: Sitting, Cuff Size: Normal)   Pulse 64   Temp 97.8 F (36.6 C) (Oral)   Ht 6' (1.829 m)   Wt 186 lb 9.6 oz (84.6 kg)   SpO2 97%   BMI 25.31 kg/m     Review of Systems  Constitutional: Negative for appetite change, chills, fatigue and fever.  HENT: Positive for congestion. Negative for dental problem, ear pain, hearing loss, sore throat, tinnitus, trouble swallowing and voice change.   Eyes: Negative for pain, discharge and visual disturbance.  Respiratory: Positive for cough. Negative for chest tightness, wheezing and stridor.   Cardiovascular: Negative for chest pain, palpitations and leg swelling.  Gastrointestinal: Negative for abdominal distention, abdominal pain, blood in stool, constipation, diarrhea, nausea and vomiting.  Genitourinary: Negative for difficulty urinating, discharge, flank pain, genital sores, hematuria and urgency.  Musculoskeletal: Negative for arthralgias, back pain, gait problem, joint swelling, myalgias and neck stiffness.  Skin: Negative for rash.  Neurological: Negative for dizziness, syncope, speech difficulty, weakness, numbness and headaches.  Hematological: Negative for adenopathy. Does not bruise/bleed easily.  Psychiatric/Behavioral: Negative for behavioral problems and dysphoric mood. The patient is not nervous/anxious.        Objective:   Physical Exam  Constitutional: He is oriented to person, place, and time. He appears well-developed. No distress.  HENT:  Head: Normocephalic.  Right Ear: External ear normal.  Left Ear: External ear normal.  Eyes: Conjunctivae and EOM are normal.  Neck: Normal range of motion.  Cardiovascular: Normal rate and normal heart sounds.   Pulmonary/Chest: Breath sounds normal. No respiratory distress. He has no wheezes. He has no rales.  Abdominal: Bowel sounds are normal.  Musculoskeletal: Normal range of motion. He exhibits no edema or tenderness.  Neurological: He is alert and oriented to  person, place, and time.  Psychiatric: He has a normal mood and affect. His behavior is normal.          Assessment & Plan:   Status post bronchitis.  Clinically stable Coronary artery disease, stable Dyslipidemia  No change in medical therapy Patient has resumed his usual activities Return as scheduled for follow-up  Nyoka Cowden

## 2017-04-08 NOTE — Patient Instructions (Signed)
Slowly increase your level of activity  Call for any new or worsening symptoms  Return for routine follow-up as scheduled

## 2017-04-14 ENCOUNTER — Ambulatory Visit: Payer: Medicare Other | Admitting: Internal Medicine

## 2017-04-25 DIAGNOSIS — N401 Enlarged prostate with lower urinary tract symptoms: Secondary | ICD-10-CM | POA: Diagnosis not present

## 2017-04-25 DIAGNOSIS — N138 Other obstructive and reflux uropathy: Secondary | ICD-10-CM | POA: Diagnosis not present

## 2017-04-25 DIAGNOSIS — N281 Cyst of kidney, acquired: Secondary | ICD-10-CM | POA: Diagnosis not present

## 2017-05-29 ENCOUNTER — Encounter: Payer: Self-pay | Admitting: Internal Medicine

## 2017-07-25 DIAGNOSIS — H524 Presbyopia: Secondary | ICD-10-CM | POA: Diagnosis not present

## 2017-07-25 DIAGNOSIS — H353132 Nonexudative age-related macular degeneration, bilateral, intermediate dry stage: Secondary | ICD-10-CM | POA: Diagnosis not present

## 2017-07-25 DIAGNOSIS — H25813 Combined forms of age-related cataract, bilateral: Secondary | ICD-10-CM | POA: Diagnosis not present

## 2017-07-25 DIAGNOSIS — H5202 Hypermetropia, left eye: Secondary | ICD-10-CM | POA: Diagnosis not present

## 2017-07-25 DIAGNOSIS — H5211 Myopia, right eye: Secondary | ICD-10-CM | POA: Diagnosis not present

## 2017-07-25 LAB — HM DIABETES EYE EXAM

## 2017-07-26 ENCOUNTER — Other Ambulatory Visit: Payer: Self-pay | Admitting: Internal Medicine

## 2017-07-29 ENCOUNTER — Encounter: Payer: Self-pay | Admitting: Internal Medicine

## 2017-08-03 ENCOUNTER — Other Ambulatory Visit: Payer: Self-pay | Admitting: Internal Medicine

## 2017-08-11 DIAGNOSIS — D485 Neoplasm of uncertain behavior of skin: Secondary | ICD-10-CM | POA: Diagnosis not present

## 2017-08-11 DIAGNOSIS — Z85828 Personal history of other malignant neoplasm of skin: Secondary | ICD-10-CM | POA: Diagnosis not present

## 2017-08-11 DIAGNOSIS — L570 Actinic keratosis: Secondary | ICD-10-CM | POA: Diagnosis not present

## 2017-08-11 DIAGNOSIS — D1801 Hemangioma of skin and subcutaneous tissue: Secondary | ICD-10-CM | POA: Diagnosis not present

## 2017-08-11 DIAGNOSIS — L821 Other seborrheic keratosis: Secondary | ICD-10-CM | POA: Diagnosis not present

## 2017-08-11 DIAGNOSIS — L814 Other melanin hyperpigmentation: Secondary | ICD-10-CM | POA: Diagnosis not present

## 2017-08-11 DIAGNOSIS — C44622 Squamous cell carcinoma of skin of right upper limb, including shoulder: Secondary | ICD-10-CM | POA: Diagnosis not present

## 2017-08-19 ENCOUNTER — Ambulatory Visit: Payer: Medicare Other | Admitting: Internal Medicine

## 2017-08-21 ENCOUNTER — Other Ambulatory Visit: Payer: Self-pay

## 2017-08-21 ENCOUNTER — Emergency Department (HOSPITAL_COMMUNITY): Payer: Medicare Other

## 2017-08-21 ENCOUNTER — Observation Stay (HOSPITAL_COMMUNITY)
Admission: EM | Admit: 2017-08-21 | Discharge: 2017-08-22 | Disposition: A | Discharge status: Home / Self Care | Payer: Medicare Other | Attending: Cardiovascular Disease | Admitting: Cardiovascular Disease

## 2017-08-21 ENCOUNTER — Encounter (HOSPITAL_COMMUNITY): Payer: Self-pay | Admitting: Emergency Medicine

## 2017-08-21 DIAGNOSIS — Z7982 Long term (current) use of aspirin: Secondary | ICD-10-CM | POA: Insufficient documentation

## 2017-08-21 DIAGNOSIS — R0789 Other chest pain: Secondary | ICD-10-CM | POA: Diagnosis not present

## 2017-08-21 DIAGNOSIS — Z8249 Family history of ischemic heart disease and other diseases of the circulatory system: Secondary | ICD-10-CM | POA: Diagnosis not present

## 2017-08-21 DIAGNOSIS — I2510 Atherosclerotic heart disease of native coronary artery without angina pectoris: Secondary | ICD-10-CM | POA: Insufficient documentation

## 2017-08-21 DIAGNOSIS — Z7984 Long term (current) use of oral hypoglycemic drugs: Secondary | ICD-10-CM | POA: Diagnosis not present

## 2017-08-21 DIAGNOSIS — Z87891 Personal history of nicotine dependence: Secondary | ICD-10-CM | POA: Insufficient documentation

## 2017-08-21 DIAGNOSIS — E1149 Type 2 diabetes mellitus with other diabetic neurological complication: Secondary | ICD-10-CM | POA: Diagnosis not present

## 2017-08-21 DIAGNOSIS — I6529 Occlusion and stenosis of unspecified carotid artery: Secondary | ICD-10-CM | POA: Diagnosis present

## 2017-08-21 DIAGNOSIS — K219 Gastro-esophageal reflux disease without esophagitis: Secondary | ICD-10-CM | POA: Diagnosis present

## 2017-08-21 DIAGNOSIS — I200 Unstable angina: Secondary | ICD-10-CM | POA: Diagnosis present

## 2017-08-21 DIAGNOSIS — I2582 Chronic total occlusion of coronary artery: Secondary | ICD-10-CM | POA: Insufficient documentation

## 2017-08-21 DIAGNOSIS — I252 Old myocardial infarction: Secondary | ICD-10-CM | POA: Insufficient documentation

## 2017-08-21 DIAGNOSIS — Z79899 Other long term (current) drug therapy: Secondary | ICD-10-CM | POA: Insufficient documentation

## 2017-08-21 DIAGNOSIS — E782 Mixed hyperlipidemia: Secondary | ICD-10-CM | POA: Insufficient documentation

## 2017-08-21 DIAGNOSIS — R079 Chest pain, unspecified: Secondary | ICD-10-CM | POA: Diagnosis not present

## 2017-08-21 DIAGNOSIS — E538 Deficiency of other specified B group vitamins: Secondary | ICD-10-CM | POA: Insufficient documentation

## 2017-08-21 DIAGNOSIS — E118 Type 2 diabetes mellitus with unspecified complications: Secondary | ICD-10-CM

## 2017-08-21 DIAGNOSIS — R072 Precordial pain: Secondary | ICD-10-CM | POA: Diagnosis not present

## 2017-08-21 DIAGNOSIS — J449 Chronic obstructive pulmonary disease, unspecified: Secondary | ICD-10-CM | POA: Insufficient documentation

## 2017-08-21 DIAGNOSIS — Z951 Presence of aortocoronary bypass graft: Secondary | ICD-10-CM

## 2017-08-21 DIAGNOSIS — E119 Type 2 diabetes mellitus without complications: Secondary | ICD-10-CM | POA: Diagnosis not present

## 2017-08-21 LAB — CBC WITH DIFFERENTIAL/PLATELET
BASOS ABS: 0 10*3/uL (ref 0.0–0.1)
Basophils Relative: 1 %
EOS ABS: 0.2 10*3/uL (ref 0.0–0.7)
EOS PCT: 3 %
HCT: 38.8 % — ABNORMAL LOW (ref 39.0–52.0)
HEMOGLOBIN: 12.9 g/dL — AB (ref 13.0–17.0)
LYMPHS ABS: 1.4 10*3/uL (ref 0.7–4.0)
LYMPHS PCT: 29 %
MCH: 32.3 pg (ref 26.0–34.0)
MCHC: 33.2 g/dL (ref 30.0–36.0)
MCV: 97.2 fL (ref 78.0–100.0)
Monocytes Absolute: 0.5 10*3/uL (ref 0.1–1.0)
Monocytes Relative: 10 %
NEUTROS PCT: 57 %
Neutro Abs: 2.9 10*3/uL (ref 1.7–7.7)
PLATELETS: 137 10*3/uL — AB (ref 150–400)
RBC: 3.99 MIL/uL — AB (ref 4.22–5.81)
RDW: 13.3 % (ref 11.5–15.5)
WBC: 5 10*3/uL (ref 4.0–10.5)

## 2017-08-21 LAB — CREATININE, SERUM
Creatinine, Ser: 0.98 mg/dL (ref 0.61–1.24)
GFR calc non Af Amer: >60 mL/min (ref >60)

## 2017-08-21 LAB — BASIC METABOLIC PANEL
Anion gap: 6 (ref 5–15)
BUN: 19 mg/dL (ref 6–20)
CHLORIDE: 105 mmol/L (ref 101–111)
CO2: 27 mmol/L (ref 22–32)
CREATININE: 0.71 mg/dL (ref 0.61–1.24)
Calcium: 8.8 mg/dL — ABNORMAL LOW (ref 8.9–10.3)
Glucose, Bld: 140 mg/dL — ABNORMAL HIGH (ref 65–99)
POTASSIUM: 4.4 mmol/L (ref 3.5–5.1)
SODIUM: 138 mmol/L (ref 135–145)

## 2017-08-21 LAB — CBC
HEMATOCRIT: 40.6 % (ref 39.0–52.0)
Hemoglobin: 13.8 g/dL (ref 13.0–17.0)
MCH: 33.1 pg (ref 26.0–34.0)
MCHC: 34 g/dL (ref 30.0–36.0)
MCV: 97.4 fL (ref 78.0–100.0)
Platelets: 142 10*3/uL — ABNORMAL LOW (ref 150–400)
RBC: 4.17 MIL/uL — AB (ref 4.22–5.81)
RDW: 13.2 % (ref 11.5–15.5)
WBC: 5.9 10*3/uL (ref 4.0–10.5)

## 2017-08-21 LAB — I-STAT TROPONIN, ED: TROPONIN I, POC: 0 ng/mL (ref 0.00–0.08)

## 2017-08-21 LAB — TROPONIN I

## 2017-08-21 MED ORDER — ATORVASTATIN CALCIUM 40 MG PO TABS
40.0000 mg | ORAL_TABLET | Freq: Every day | ORAL | Status: DC
  Administered 2017-08-21: 40 mg via ORAL
  Filled 2017-08-21: qty 1

## 2017-08-21 MED ORDER — ASPIRIN 81 MG PO CHEW
81.0000 mg | CHEWABLE_TABLET | ORAL | Status: AC
  Administered 2017-08-22: 81 mg via ORAL
  Filled 2017-08-21: qty 1

## 2017-08-21 MED ORDER — ASPIRIN 81 MG PO CHEW: 324.0000 mg | CHEWABLE_TABLET | ORAL | Status: DC

## 2017-08-21 MED ORDER — ASPIRIN 81 MG PO TABS
81.0000 mg | ORAL_TABLET | Freq: Every day | ORAL | Status: DC
Start: 2017-08-21 — End: 2017-08-21

## 2017-08-21 MED ORDER — METOPROLOL TARTRATE 25 MG PO TABS
25.0000 mg | ORAL_TABLET | Freq: Two times a day (BID) | ORAL | Status: DC
  Administered 2017-08-21 – 2017-08-22 (×2): 25 mg via ORAL
  Filled 2017-08-21 (×3): qty 1

## 2017-08-21 MED ORDER — HEPARIN SODIUM (PORCINE) 5000 UNIT/ML IJ SOLN
5000.0000 [IU] | Freq: Three times a day (TID) | INTRAMUSCULAR | Status: DC
  Administered 2017-08-21 – 2017-08-22 (×2): 5000 [IU] via SUBCUTANEOUS
  Filled 2017-08-21 (×2): qty 1

## 2017-08-21 MED ORDER — SODIUM CHLORIDE 0.9 % IV SOLN: 250.0000 mL | INTRAVENOUS | Status: DC | PRN

## 2017-08-21 MED ORDER — SODIUM CHLORIDE 0.9 % WEIGHT BASED INFUSION: 1.0000 mL/kg/h | INTRAVENOUS | Status: DC

## 2017-08-21 MED ORDER — SODIUM CHLORIDE 0.9% FLUSH
3.0000 mL | Freq: Two times a day (BID) | INTRAVENOUS | Status: DC
  Administered 2017-08-21 – 2017-08-22 (×2): 3 mL via INTRAVENOUS

## 2017-08-21 MED ORDER — ONDANSETRON HCL 4 MG/2ML IJ SOLN: 4.0000 mg | Freq: Four times a day (QID) | INTRAMUSCULAR | Status: DC | PRN

## 2017-08-21 MED ORDER — SODIUM CHLORIDE 0.9% FLUSH
3.0000 mL | Freq: Two times a day (BID) | INTRAVENOUS | Status: DC
  Administered 2017-08-21: 3 mL via INTRAVENOUS

## 2017-08-21 MED ORDER — NITROGLYCERIN 0.4 MG SL SUBL: 0.4000 mg | SUBLINGUAL_TABLET | SUBLINGUAL | Status: DC | PRN

## 2017-08-21 MED ORDER — ASPIRIN 300 MG RE SUPP: 300.0000 mg | RECTAL | Status: DC

## 2017-08-21 MED ORDER — SODIUM CHLORIDE 0.9% FLUSH: 3.0000 mL | INTRAVENOUS | Status: DC | PRN

## 2017-08-21 MED ORDER — ASPIRIN EC 81 MG PO TBEC: 81.0000 mg | DELAYED_RELEASE_TABLET | Freq: Every day | ORAL | Status: DC

## 2017-08-21 MED ORDER — ACETAMINOPHEN 325 MG PO TABS: 650.0000 mg | ORAL_TABLET | ORAL | Status: DC | PRN

## 2017-08-21 MED ORDER — SODIUM CHLORIDE 0.9 % WEIGHT BASED INFUSION
3.0000 mL/kg/h | INTRAVENOUS | Status: DC
  Administered 2017-08-22: 3 mL/kg/h via INTRAVENOUS

## 2017-08-21 NOTE — ED Provider Notes (Signed)
[place] EMERGENCY DEPARTMENT Provider Note   CSN: [id number] Arrival date & time: 08/21/17  2505     History   Chief Complaint Chief Complaint  Patient presents with  . Chest Pain    HPI REYAN HELLE is a 83 y.o. male.  ABID BOLLA is a 83 y.o. Male who presents to the emergency department complaining of left-sided chest pain with onset when he woke up this morning.  Patient reports left-sided chest pain that is spontaneously resolved after arrival to the emergency department.  He reports he took one nitroglycerin at home as well as 325 mg of aspirin prior to EMS arrival.  He also received 1 additional dose of nitroglycerin by EMS.  He reports since arrival to the emergency department his chest pain has completely spontaneously resolved.  He reports he was having left-sided chest pain with associated worsening pain with deep inspiration.  He denies feeling short of breath.  He denies any coughing.  He reports he recently recovered from bronchitis and has been feeling much better recently.  He denies any continued coughing.  He is status post CABG about 7 years ago.  He is followed by cardiologist Dr. Johnsie Cancel.  He denies fevers, coughing, shortness of breath, wheezing, abdominal pain, nausea, vomiting, leg pain, leg swelling, syncope or rashes.   The history is provided by the patient and medical records. No language interpreter was used.  Chest Pain   Pertinent negatives include no abdominal pain, no back pain, no cough, no fever, no headaches, no nausea, no palpitations, no shortness of breath and no vomiting.    Past Medical History:  Diagnosis Date  . Arthritis    "right shoulder" (05/24/2015)  . CHEST PAIN   . Chronic bronchitis ([place])    "get it ~ q yr" (05/24/2015)  . COLONIC POLYPS, HX OF   . COPD (chronic obstructive pulmonary disease) ([place])    "dx'd but I don't take RX for it" (05/24/2015)  . Coronary artery disease   . Cystic kidney disease    . GERD (gastroesophageal reflux disease)   . History of hiatal hernia   . MI (myocardial infarction) ([place]) 09/2010  . Mixed hyperlipidemia   . Skin cancer    "top of my head only" (05/24/2015)  . Sleep apnea   . Syncope and collapse ~ 2013; 05/24/2015    Patient Active Problem List   Diagnosis Date Noted  . Chest pain 08/21/2017  . B12 deficiency 11/08/2016  . Type 2 diabetes mellitus with neurological complications ([place]) 39/76/7341  . Syncope and collapse 05/24/2015  . Carotid artery stenosis 05/22/2012  . CAD (coronary artery disease) 05/14/2012  . FH: CABG (coronary artery bypass surgery) 01/28/2011  . Mixed hyperlipidemia 10/23/2010  . GERD (gastroesophageal reflux disease) 10/22/2010  . History of colonic polyps 06/14/2010    Past Surgical History:  Procedure Laterality Date  . CARDIAC CATHETERIZATION  09/2010  . CHOLECYSTECTOMY OPEN  1998  . COLECTOMY  1998   "partial"  . CORONARY ANGIOPLASTY    . CORONARY ARTERY BYPASS GRAFT  09/2010   Median sternotomy for coronary artery bypass grafting x3  (left internal mammary artery to distal left anterior  descending  coronary artery, saphenous vein graft to first diagonal branch,  saphenous vein graft to first  obtuse marginal branch, endoscopic  saphenous vein harvest from right thigh). SURGEON:  Valentina Gu.  Roxy Manns, MD  ASSISTANT:  John Giovanni, PA-C  ANESTHESIA:  Glynda Jaeger, MD   .  SKIN CANCER EXCISION     "top of my head"       Home Medications    Prior to Admission medications   Medication Sig Start Date End Date Taking? Authorizing Provider  aspirin 81 MG tablet Take 81 mg by mouth daily.   Yes [provider]  atorvastatin (LIPITOR) 20 MG tablet TAKE 1 TABLET (20 MG TOTAL) BY MOUTH DAILY. 08/05/17  Yes Marletta Lor, MD  ketoconazole (NIZORAL) 2 % shampoo APPLY EVERY OTHER DAY TO SCALP--LATHER 5 MINUTES,THEN RINSE OUT 03/29/16  Yes Marletta Lor, MD  metFORMIN (GLUCOPHAGE-XR) 500 MG 24 hr  tablet TAKE 2 TABLETS (1,000 MG TOTAL) BY MOUTH DAILY WITH BREAKFAST. 07/28/17  Yes Marletta Lor, MD  metoprolol tartrate (LOPRESSOR) 25 MG tablet TAKE 1 TABLET (25 MG TOTAL) BY MOUTH 2 (TWO) TIMES DAILY. 08/05/17  Yes Marletta Lor, MD  Multiple Vitamins-Minerals (PRESERVISION AREDS 2 PO) Take 1 tablet by mouth daily.   Yes [provider]  cyanocobalamin (,VITAMIN B-12,) 1000 MCG/ML injection INJECT 1ML ONCE A MONTH THEREAFTER 10/24/16   [provider]  EPINEPHrine (EPIPEN 2-PAK) 0.3 mg/0.3 mL IJ SOAJ injection Inject 0.3 mLs (0.3 mg total) into the muscle once. 05/25/15   Geradine Girt, DO  nitroGLYCERIN (NITROSTAT) 0.4 MG SL tablet Place 1 tablet (0.4 mg total) under the tongue every 5 (five) minutes as needed for chest pain (3 doses max). 01/31/16   Josue Hector, MD    Family History Family History  Problem Relation Age of Onset  . Lung cancer Sister   . Heart attack Father     Social History Social History   Tobacco Use  . Smoking status: Former Smoker    Packs/day: 1.50    Years: 3.00    Pack years: 4.50    Types: Cigarettes    Last attempt to quit: 12/09/1951    Years since quitting: 65.7  . Smokeless tobacco: Never Used  Substance Use Topics  . Alcohol use: Yes    Comment: "no alcohol since 1953"  . Drug use: No     Allergies   Levaquin [levofloxacin in d5w] and Other   Review of Systems Review of Systems  Constitutional: Negative for chills and fever.  HENT: Negative for congestion and sore throat.   Eyes: Negative for visual disturbance.  Respiratory: Negative for cough, shortness of breath and wheezing.   Cardiovascular: Positive for chest pain. Negative for palpitations and leg swelling.  Gastrointestinal: Negative for abdominal pain, diarrhea, nausea and vomiting.  Genitourinary: Negative for dysuria.  Musculoskeletal: Negative for back pain and neck pain.  Skin: Negative for rash.  Neurological: Negative for headaches.       Physical Exam Updated Vital Signs BP 107/65 (BP Location: Right Arm)   Pulse 63   Temp 97.9 F (36.6 C) (Oral)   Resp 17   Ht 5\' 11"  (1.803 m)   Wt 84.4 kg (186 lb)   SpO2 99%   BMI 25.94 kg/m   Physical Exam  Constitutional: He appears well-developed and well-nourished.  Non-toxic appearance. He does not appear ill. No distress.  HENT:  Head: Normocephalic and atraumatic.  Mouth/Throat: Oropharynx is clear and moist.  Eyes: Conjunctivae are normal. Pupils are equal, round, and reactive to light. Right eye exhibits no discharge. Left eye exhibits no discharge.  Neck: Neck supple. No JVD present.  Cardiovascular: Normal rate, regular rhythm, normal heart sounds and intact distal pulses. Exam reveals no gallop and no friction rub.  No murmur heard. Pulses:      Radial pulses are 2+ on the right side, and 2+ on the left side.  Pulmonary/Chest: Effort normal and breath sounds normal. No respiratory distress. He has no wheezes. He has no rales.  Faint scattered rhonchi bilaterally.  No increased work of breathing.  No rales.  Symmetric chest expansion bilaterally.  No increased work of breathing.  Abdominal: Soft. There is no tenderness.  Musculoskeletal: He exhibits no edema.       Right lower leg: He exhibits no tenderness and no edema.       Left lower leg: He exhibits no tenderness and no edema.  Lymphadenopathy:    He has no cervical adenopathy.  Neurological: He is alert. Coordination normal.  Skin: Skin is warm and dry. Capillary refill takes less than 2 seconds. No rash noted. He is not diaphoretic. No erythema. No pallor.  Psychiatric: He has a normal mood and affect. His behavior is normal.  Nursing note and vitals reviewed.    ED Treatments / Results  Labs (all labs ordered are listed, but only abnormal results are displayed) Labs Reviewed  BASIC METABOLIC PANEL - Abnormal; Notable for the following components:      Result Value   Glucose, Bld 140 (*)     Calcium 8.8 (*)    All other components within normal limits  CBC WITH DIFFERENTIAL/PLATELET - Abnormal; Notable for the following components:   RBC 3.99 (*)    Hemoglobin 12.9 (*)    HCT 38.8 (*)    Platelets 137 (*)    All other components within normal limits  I-STAT TROPONIN, ED    EKG  EKG Interpretation  Date/Time:  Thursday August 21 2017 08:32:26 EST Ventricular Rate:  63 PR Interval:    QRS Duration: 111 QT Interval:  433 QTC Calculation: 444 R Axis:   -53 Text Interpretation:  Sinus rhythm LAD, consider left anterior fascicular block Abnormal R-wave progression, early transition No significant change since last tracing Confirmed by Davonna Belling [id number]) on 08/21/2017 10:08:41 AM       Radiology Dg Chest 2 View  Result Date: 08/21/2017 CLINICAL DATA:  Chest pain EXAM: CHEST  2 VIEW COMPARISON:  March 27, 2017 FINDINGS: There is mild scarring in the lateral left base, stable. There is no edema or consolidation. Heart size and pulmonary vascularity are normal. No adenopathy. Patient is status post coronary artery bypass grafting. There is aortic atherosclerosis. No bone lesions are evident. IMPRESSION: Stable scarring lateral left base. No edema or consolidation. Stable cardiac silhouette. There is aortic atherosclerosis. Aortic Atherosclerosis (ICD10-I70.0). Electronically Signed   By: Lowella Grip III M.D.   On: 08/21/2017 09:09    Procedures Procedures (including critical care time)  Medications Ordered in ED Medications - No data to display   Initial Impression / Assessment and Plan / ED Course  I have reviewed the triage vital signs and the nursing notes.  Pertinent labs & imaging results that were available during my care of the patient were reviewed by me and considered in my medical decision making (see chart for details).    This is a 83 y.o. Male who presents to the emergency department complaining of left-sided chest pain with onset when he  woke up this morning.  Patient reports left-sided chest pain that is spontaneously resolved after arrival to the emergency department.  He reports he took one nitroglycerin at home as well as 325 mg of aspirin prior to EMS arrival.  He also received 1 additional dose of nitroglycerin by EMS.  He reports since arrival to the emergency department his chest pain has completely spontaneously resolved.  He reports he was having left-sided chest pain with associated worsening pain with deep inspiration.  He denies feeling short of breath.  He denies any coughing.  He reports he recently recovered from bronchitis and has been feeling much better recently.  He denies any continued coughing.  He is status post CABG about 7 years ago.  He is followed by cardiologist Dr. Johnsie Cancel. On exam the patient is afebrile nontoxic-appearing.  Lungs are clear to auscultation bilaterally.  EKG shows no significant change from his last tracing.  Troponin is not elevated. Chest x-ray shows stable scarring at the lateral left base.  No edema or consolidation.  Aortic atherosclerosis on chest x-ray.  At reevaluation patient is still chest pain-free.  Will have the patient evaluated by cardiology for possible admission for ACS rule out.  Patient and family agree with plan for cardiology evaluation in the emergency department.  Cardiology came to see the patient and plan is for admission for ACS rule out. Patient admitted by cardiology.   This patient was discussed with Dr. Alvino Chapel who agrees with assessment and plan.   Final Clinical Impressions(s) / ED Diagnoses   Final diagnoses:  Precordial pain  Hx of CABG  Type 2 diabetes mellitus with complication, without long-term current use of insulin [facility])    ED Discharge Orders    None       Waynetta Pean, PA-C 08/21/17 1237    Davonna Belling, MD 08/21/17 [id number]

## 2017-08-21 NOTE — H&P (View-Only) (Signed)
Physician History and Physical     Patient ID: Michael Johnston MRN: [id number] DOB/AGE: 1931-10-22 83 y.o. Admit date: 08/21/2017  Primary Care Physician: Marletta Lor, MD Primary Cardiologist: Johnsie Cancel  Active Problems:   Chest pain   HPI:  83 y.o. with previous CABG 2012. Had SEMI at dentist office 09/2010 had LIMA to LAD SVG to D1 SVG to OM The RCA was not bypassed. EF has been normal 55-60% by echo 2016. Myovue 2014 normal with EF 71%.  Also with carotid plaque no stenosis Started lifting Hand weights this week to build up his strength. Awoke this am with SSCP and dyspnea. Pain briefly pleuritic. Did not resolve with first nitro but did after EMS gave him 3rd with ASA. Currently pain free. Troponin negative CXR with NAD ECG with no acute changes. No cough Fever sputum Pain did not appear muscular or worse in any position or movement. He does Not remember what pain was like with his SEMI 6 years ago.   Review of systems complete and found to be negative unless listed above   Past Medical History:  Diagnosis Date  . Arthritis    "right shoulder" (05/24/2015)  . CHEST PAIN   . Chronic bronchitis ([place])    "get it ~ q yr" (05/24/2015)  . COLONIC POLYPS, HX OF   . COPD (chronic obstructive pulmonary disease) ([place])    "dx'd but I don't take RX for it" (05/24/2015)  . Coronary artery disease   . Cystic kidney disease   . GERD (gastroesophageal reflux disease)   . History of hiatal hernia   . MI (myocardial infarction) ([place]) 09/2010  . Mixed hyperlipidemia   . Skin cancer    "top of my head only" (05/24/2015)  . Sleep apnea   . Syncope and collapse ~ 2013; 05/24/2015    Family History  Problem Relation Age of Onset  . Lung cancer Sister   . Heart attack Father     Social History   Socioeconomic History  . Marital status: Married    Spouse name: Not on file  . Number of children: Not on file  . Years of education: Not on file  . Highest education level: Not on file   Social Needs  . Financial resource strain: Not on file  . Food insecurity - worry: Not on file  . Food insecurity - inability: Not on file  . Transportation needs - medical: Not on file  . Transportation needs - non-medical: Not on file  Occupational History  . Not on file  Tobacco Use  . Smoking status: Former Smoker    Packs/day: 1.50    Years: 3.00    Pack years: 4.50    Types: Cigarettes    Last attempt to quit: 12/09/1951    Years since quitting: 65.7  . Smokeless tobacco: Never Used  Substance and Sexual Activity  . Alcohol use: Yes    Comment: "no alcohol since 1953"  . Drug use: No  . Sexual activity: Not on file  Other Topics Concern  . Not on file  Social History Narrative  . Not on file    Past Surgical History:  Procedure Laterality Date  . CARDIAC CATHETERIZATION  09/2010  . CHOLECYSTECTOMY OPEN  1998  . COLECTOMY  1998   "partial"  . CORONARY ANGIOPLASTY    . CORONARY ARTERY BYPASS GRAFT  09/2010   Median sternotomy for coronary artery bypass grafting x3  (left internal mammary artery to distal left anterior  descending  coronary artery, saphenous vein graft to first diagonal branch,  saphenous vein graft to first  obtuse marginal branch, endoscopic  saphenous vein harvest from right thigh). SURGEON:  Valentina Gu.  Roxy Manns, MD  ASSISTANT:  John Giovanni, PA-C  ANESTHESIA:  Glynda Jaeger, MD   . SKIN CANCER EXCISION     "top of my head"      (Not in a hospital admission)  Physical Exam: Blood pressure 107/65, pulse 63, temperature 97.9 F (36.6 C), temperature source Oral, resp. rate 17, height 5\' 11"  (1.803 m), weight 186 lb (84.4 kg), SpO2 99 %.    Affect appropriate Healthy:  appears stated age 89: normal Neck supple with no adenopathy JVP normal right bruits no thyromegaly Lungs clear with no wheezing and good diaphragmatic motion Heart:  S1/S2 no murmur, no rub, gallop or click PMI normal Abdomen: benighn, BS positve, no tenderness, no AAA no  bruit.  No HSM or HJR Distal pulses intact with no bruits No edema Neuro non-focal Skin warm and dry No muscular weakness  No current facility-administered medications on file prior to encounter.    Current Outpatient Medications on File Prior to Encounter  Medication Sig Dispense Refill  . aspirin 81 MG tablet Take 81 mg by mouth daily.    Marland Kitchen atorvastatin (LIPITOR) 20 MG tablet TAKE 1 TABLET (20 MG TOTAL) BY MOUTH DAILY. 90 tablet 1  . ketoconazole (NIZORAL) 2 % shampoo APPLY EVERY OTHER DAY TO SCALP--LATHER 5 MINUTES,THEN RINSE OUT 120 mL 5  . metFORMIN (GLUCOPHAGE-XR) 500 MG 24 hr tablet TAKE 2 TABLETS (1,000 MG TOTAL) BY MOUTH DAILY WITH BREAKFAST. 180 tablet 2  . metoprolol tartrate (LOPRESSOR) 25 MG tablet TAKE 1 TABLET (25 MG TOTAL) BY MOUTH 2 (TWO) TIMES DAILY. 180 tablet 3  . Multiple Vitamins-Minerals (PRESERVISION AREDS 2 PO) Take 1 tablet by mouth daily.    . cyanocobalamin (,VITAMIN B-12,) 1000 MCG/ML injection INJECT 1ML ONCE A MONTH THEREAFTER  1  . EPINEPHrine (EPIPEN 2-PAK) 0.3 mg/0.3 mL IJ SOAJ injection Inject 0.3 mLs (0.3 mg total) into the muscle once. 2 Device 0  . nitroGLYCERIN (NITROSTAT) 0.4 MG SL tablet Place 1 tablet (0.4 mg total) under the tongue every 5 (five) minutes as needed for chest pain (3 doses max). 25 tablet 1    Labs:   Lab Results  Component Value Date   WBC 5.0 08/21/2017   HGB 12.9 (L) 08/21/2017   HCT 38.8 (L) 08/21/2017   MCV 97.2 08/21/2017   PLT 137 (L) 08/21/2017    Recent Labs  Lab 08/21/17 0844  NA 138  K 4.4  CL 105  CO2 27  BUN 19  CREATININE 0.71  CALCIUM 8.8*  GLUCOSE 140*   Lab Results  Component Value Date   CKTOTAL 171 11/08/2016   CKTOTAL 168 09/26/2010   CKTOTAL 197 09/25/2010   CKMB 6.3 (H) 11/08/2016   CKMB 5.5 (H) 09/26/2010   CKMB (HH) 09/25/2010    6.2 CRITICAL RESULT CALLED TO, READ BACK BY AND VERIFIED WITH: CLARK K,RN 09/25/10 2202 WAYK   TROPONINI <0.03 11/09/2016   TROPONINI <0.03 05/25/2015    TROPONINI <0.03 05/25/2015     Lab Results  Component Value Date   CHOL 120 10/15/2016   CHOL 126 10/18/2014   CHOL 165 09/14/2013   Lab Results  Component Value Date   HDL 47.60 10/15/2016   HDL 55.80 10/18/2014   HDL 39.30 09/14/2013   Lab Results  Component Value Date  LDLCALC 55 10/15/2016   LDLCALC 51 10/18/2014   LDLCALC 109 (H) 09/14/2013   Lab Results  Component Value Date   TRIG 90.0 10/15/2016   TRIG 98.0 10/18/2014   TRIG 84.0 09/14/2013   Lab Results  Component Value Date   CHOLHDL 3 10/15/2016   CHOLHDL 2 10/18/2014   CHOLHDL 4 09/14/2013   No results found for: LDLDIRECT     Radiology: Dg Chest 2 View  Result Date: 08/21/2017 CLINICAL DATA:  Chest pain EXAM: CHEST  2 VIEW COMPARISON:  March 27, 2017 FINDINGS: There is mild scarring in the lateral left base, stable. There is no edema or consolidation. Heart size and pulmonary vascularity are normal. No adenopathy. Patient is status post coronary artery bypass grafting. There is aortic atherosclerosis. No bone lesions are evident. IMPRESSION: Stable scarring lateral left base. No edema or consolidation. Stable cardiac silhouette. There is aortic atherosclerosis. Aortic Atherosclerosis (ICD10-I70.0). Electronically Signed   By: Lowella Grip III M.D.   On: 08/21/2017 09:09    EKG: SR nonspecific ST changes   ASSESSMENT AND PLAN:   1) Chest Pain: Post CABG 6 years ago and RCA not grafted. Eventual relief with nitro In setting of newly increased activity. Troponin negative and ECG no acute changes Discussed options with family and patient prefer admission and diagnostic cath in am  Patient in agreement. Lab called orders written Heparin only if troponin trends positive Continue ASA/Beta Blocker   2) DM:  Hold glucophage for cath cover with SS insulin  3) Cholesterol :  On statin   4) Bruit:  Duplex 12/2015 plaque no stenosis not due fo another until April 2019 No TIA symptoms continue  ASA  Signed: [name] Nishan12/13/2018, 12:32 PM

## 2017-08-21 NOTE — ED Notes (Signed)
Remains pain free-- ate all of lunch, family at bedside

## 2017-08-21 NOTE — Plan of Care (Signed)
Up ad lib. Ambulates independently with ease. Educated on use of call light system. Verbalizes understanding of need to call for assistance prior to ambulation when necessary

## 2017-08-21 NOTE — ED Notes (Signed)
Heart Healthy diet lunch tray ordered.  

## 2017-08-21 NOTE — Consult Note (Signed)
Physician History and Physical     Patient ID: Michael Johnston MRN: [id number] DOB/AGE: 03-31-1932 83 y.o. Admit date: 08/21/2017  Primary Care Physician: Marletta Lor, MD Primary Cardiologist: Johnsie Cancel  Active Problems:   Chest pain   HPI:  83 y.o. with previous CABG 2012. Had SEMI at dentist office 09/2010 had LIMA to LAD SVG to D1 SVG to OM The RCA was not bypassed. EF has been normal 55-60% by echo 2016. Myovue 2014 normal with EF 71%.  Also with carotid plaque no stenosis Started lifting Hand weights this week to build up his strength. Awoke this am with SSCP and dyspnea. Pain briefly pleuritic. Did not resolve with first nitro but did after EMS gave him 3rd with ASA. Currently pain free. Troponin negative CXR with NAD ECG with no acute changes. No cough Fever sputum Pain did not appear muscular or worse in any position or movement. He does Not remember what pain was like with his SEMI 6 years ago.   Review of systems complete and found to be negative unless listed above   Past Medical History:  Diagnosis Date  . Arthritis    "right shoulder" (05/24/2015)  . CHEST PAIN   . Chronic bronchitis ([place])    "get it ~ q yr" (05/24/2015)  . COLONIC POLYPS, HX OF   . COPD (chronic obstructive pulmonary disease) ([place])    "dx'd but I don't take RX for it" (05/24/2015)  . Coronary artery disease   . Cystic kidney disease   . GERD (gastroesophageal reflux disease)   . History of hiatal hernia   . MI (myocardial infarction) ([place]) 09/2010  . Mixed hyperlipidemia   . Skin cancer    "top of my head only" (05/24/2015)  . Sleep apnea   . Syncope and collapse ~ 2013; 05/24/2015    Family History  Problem Relation Age of Onset  . Lung cancer Sister   . Heart attack Father     Social History   Socioeconomic History  . Marital status: Married    Spouse name: Not on file  . Number of children: Not on file  . Years of education: Not on file  . Highest education level: Not on file   Social Needs  . Financial resource strain: Not on file  . Food insecurity - worry: Not on file  . Food insecurity - inability: Not on file  . Transportation needs - medical: Not on file  . Transportation needs - non-medical: Not on file  Occupational History  . Not on file  Tobacco Use  . Smoking status: Former Smoker    Packs/day: 1.50    Years: 3.00    Pack years: 4.50    Types: Cigarettes    Last attempt to quit: 12/09/1951    Years since quitting: 65.7  . Smokeless tobacco: Never Used  Substance and Sexual Activity  . Alcohol use: Yes    Comment: "no alcohol since 1953"  . Drug use: No  . Sexual activity: Not on file  Other Topics Concern  . Not on file  Social History Narrative  . Not on file    Past Surgical History:  Procedure Laterality Date  . CARDIAC CATHETERIZATION  09/2010  . CHOLECYSTECTOMY OPEN  1998  . COLECTOMY  1998   "partial"  . CORONARY ANGIOPLASTY    . CORONARY ARTERY BYPASS GRAFT  09/2010   Median sternotomy for coronary artery bypass grafting x3  (left internal mammary artery to distal left anterior  descending  coronary artery, saphenous vein graft to first diagonal branch,  saphenous vein graft to first  obtuse marginal branch, endoscopic  saphenous vein harvest from right thigh). SURGEON:  Valentina Gu.  Roxy Manns, MD  ASSISTANT:  John Giovanni, PA-C  ANESTHESIA:  Glynda Jaeger, MD   . SKIN CANCER EXCISION     "top of my head"      (Not in a hospital admission)  Physical Exam: Blood pressure 107/65, pulse 63, temperature 97.9 F (36.6 C), temperature source Oral, resp. rate 17, height 5\' 11"  (1.803 m), weight 186 lb (84.4 kg), SpO2 99 %.    Affect appropriate Healthy:  appears stated age 6: normal Neck supple with no adenopathy JVP normal right bruits no thyromegaly Lungs clear with no wheezing and good diaphragmatic motion Heart:  S1/S2 no murmur, no rub, gallop or click PMI normal Abdomen: benighn, BS positve, no tenderness, no AAA no  bruit.  No HSM or HJR Distal pulses intact with no bruits No edema Neuro non-focal Skin warm and dry No muscular weakness  No current facility-administered medications on file prior to encounter.    Current Outpatient Medications on File Prior to Encounter  Medication Sig Dispense Refill  . aspirin 81 MG tablet Take 81 mg by mouth daily.    Marland Kitchen atorvastatin (LIPITOR) 20 MG tablet TAKE 1 TABLET (20 MG TOTAL) BY MOUTH DAILY. 90 tablet 1  . ketoconazole (NIZORAL) 2 % shampoo APPLY EVERY OTHER DAY TO SCALP--LATHER 5 MINUTES,THEN RINSE OUT 120 mL 5  . metFORMIN (GLUCOPHAGE-XR) 500 MG 24 hr tablet TAKE 2 TABLETS (1,000 MG TOTAL) BY MOUTH DAILY WITH BREAKFAST. 180 tablet 2  . metoprolol tartrate (LOPRESSOR) 25 MG tablet TAKE 1 TABLET (25 MG TOTAL) BY MOUTH 2 (TWO) TIMES DAILY. 180 tablet 3  . Multiple Vitamins-Minerals (PRESERVISION AREDS 2 PO) Take 1 tablet by mouth daily.    . cyanocobalamin (,VITAMIN B-12,) 1000 MCG/ML injection INJECT 1ML ONCE A MONTH THEREAFTER  1  . EPINEPHrine (EPIPEN 2-PAK) 0.3 mg/0.3 mL IJ SOAJ injection Inject 0.3 mLs (0.3 mg total) into the muscle once. 2 Device 0  . nitroGLYCERIN (NITROSTAT) 0.4 MG SL tablet Place 1 tablet (0.4 mg total) under the tongue every 5 (five) minutes as needed for chest pain (3 doses max). 25 tablet 1    Labs:   Lab Results  Component Value Date   WBC 5.0 08/21/2017   HGB 12.9 (L) 08/21/2017   HCT 38.8 (L) 08/21/2017   MCV 97.2 08/21/2017   PLT 137 (L) 08/21/2017    Recent Labs  Lab 08/21/17 0844  NA 138  K 4.4  CL 105  CO2 27  BUN 19  CREATININE 0.71  CALCIUM 8.8*  GLUCOSE 140*   Lab Results  Component Value Date   CKTOTAL 171 11/08/2016   CKTOTAL 168 09/26/2010   CKTOTAL 197 09/25/2010   CKMB 6.3 (H) 11/08/2016   CKMB 5.5 (H) 09/26/2010   CKMB (HH) 09/25/2010    6.2 CRITICAL RESULT CALLED TO, READ BACK BY AND VERIFIED WITH: CLARK K,RN 09/25/10 2202 WAYK   TROPONINI <0.03 11/09/2016   TROPONINI <0.03 05/25/2015    TROPONINI <0.03 05/25/2015     Lab Results  Component Value Date   CHOL 120 10/15/2016   CHOL 126 10/18/2014   CHOL 165 09/14/2013   Lab Results  Component Value Date   HDL 47.60 10/15/2016   HDL 55.80 10/18/2014   HDL 39.30 09/14/2013   Lab Results  Component Value Date  LDLCALC 55 10/15/2016   LDLCALC 51 10/18/2014   LDLCALC 109 (H) 09/14/2013   Lab Results  Component Value Date   TRIG 90.0 10/15/2016   TRIG 98.0 10/18/2014   TRIG 84.0 09/14/2013   Lab Results  Component Value Date   CHOLHDL 3 10/15/2016   CHOLHDL 2 10/18/2014   CHOLHDL 4 09/14/2013   No results found for: LDLDIRECT     Radiology: Dg Chest 2 View  Result Date: 08/21/2017 CLINICAL DATA:  Chest pain EXAM: CHEST  2 VIEW COMPARISON:  March 27, 2017 FINDINGS: There is mild scarring in the lateral left base, stable. There is no edema or consolidation. Heart size and pulmonary vascularity are normal. No adenopathy. Patient is status post coronary artery bypass grafting. There is aortic atherosclerosis. No bone lesions are evident. IMPRESSION: Stable scarring lateral left base. No edema or consolidation. Stable cardiac silhouette. There is aortic atherosclerosis. Aortic Atherosclerosis (ICD10-I70.0). Electronically Signed   By: Lowella Grip III M.D.   On: 08/21/2017 09:09    EKG: SR nonspecific ST changes   ASSESSMENT AND PLAN:   1) Chest Pain: Post CABG 6 years ago and RCA not grafted. Eventual relief with nitro In setting of newly increased activity. Troponin negative and ECG no acute changes Discussed options with family and patient prefer admission and diagnostic cath in am  Patient in agreement. Lab called orders written Heparin only if troponin trends positive Continue ASA/Beta Blocker   2) DM:  Hold glucophage for cath cover with SS insulin  3) Cholesterol :  On statin   4) Bruit:  Duplex 12/2015 plaque no stenosis not due fo another until April 2019 No TIA symptoms continue  ASA  Signed: [name] Nishan12/13/2018, 12:32 PM

## 2017-08-21 NOTE — ED Triage Notes (Addendum)
To ED via [facility] with chest pain that started this morning, took NTG x 1 and ASA 324mg  at home, received NTG x 1 sl enroute,  Pain left sided, hurts more with inspiration-- "better than it was"   Pt states has had bronchitis taking OTC meds.

## 2017-08-21 NOTE — ED Notes (Signed)
Family at bedside, pain free

## 2017-08-22 ENCOUNTER — Encounter (HOSPITAL_COMMUNITY): Payer: Self-pay | Admitting: Cardiology

## 2017-08-22 ENCOUNTER — Encounter (HOSPITAL_COMMUNITY)
Admission: EM | Disposition: A | Discharge status: Home / Self Care | Payer: Self-pay | Source: Home / Self Care | Attending: Emergency Medicine

## 2017-08-22 DIAGNOSIS — R079 Chest pain, unspecified: Secondary | ICD-10-CM | POA: Diagnosis not present

## 2017-08-22 DIAGNOSIS — I208 Other forms of angina pectoris: Secondary | ICD-10-CM

## 2017-08-22 DIAGNOSIS — R072 Precordial pain: Secondary | ICD-10-CM | POA: Diagnosis not present

## 2017-08-22 DIAGNOSIS — I6529 Occlusion and stenosis of unspecified carotid artery: Secondary | ICD-10-CM | POA: Diagnosis not present

## 2017-08-22 DIAGNOSIS — E782 Mixed hyperlipidemia: Secondary | ICD-10-CM | POA: Diagnosis not present

## 2017-08-22 DIAGNOSIS — I200 Unstable angina: Secondary | ICD-10-CM | POA: Diagnosis not present

## 2017-08-22 DIAGNOSIS — E1149 Type 2 diabetes mellitus with other diabetic neurological complication: Secondary | ICD-10-CM | POA: Diagnosis not present

## 2017-08-22 HISTORY — PX: LEFT HEART CATH AND CORS/GRAFTS ANGIOGRAPHY: CATH118250

## 2017-08-22 LAB — CBC
HEMATOCRIT: 39 % (ref 39.0–52.0)
HEMOGLOBIN: 12.8 g/dL — AB (ref 13.0–17.0)
MCH: 31.8 pg (ref 26.0–34.0)
MCHC: 32.8 g/dL (ref 30.0–36.0)
MCV: 97 fL (ref 78.0–100.0)
Platelets: 136 10*3/uL — ABNORMAL LOW (ref 150–400)
RBC: 4.02 MIL/uL — ABNORMAL LOW (ref 4.22–5.81)
RDW: 13.3 % (ref 11.5–15.5)
WBC: 5.2 10*3/uL (ref 4.0–10.5)

## 2017-08-22 LAB — BASIC METABOLIC PANEL
ANION GAP: 7 (ref 5–15)
BUN: 21 mg/dL — AB (ref 6–20)
CALCIUM: 9.2 mg/dL (ref 8.9–10.3)
CO2: 27 mmol/L (ref 22–32)
Chloride: 104 mmol/L (ref 101–111)
Creatinine, Ser: 0.81 mg/dL (ref 0.61–1.24)
GFR calc Af Amer: >60 mL/min (ref >60)
Glucose, Bld: 124 mg/dL — ABNORMAL HIGH (ref 65–99)
POTASSIUM: 4.4 mmol/L (ref 3.5–5.1)
SODIUM: 138 mmol/L (ref 135–145)

## 2017-08-22 LAB — LIPID PANEL
CHOL/HDL RATIO: 2.2 ratio
CHOLESTEROL: 100 mg/dL (ref 0–200)
HDL: 46 mg/dL (ref >40)
LDL Cholesterol: 37 mg/dL (ref 0–99)
Triglycerides: 83 mg/dL (ref <150)
VLDL: 17 mg/dL (ref 0–40)

## 2017-08-22 LAB — TROPONIN I

## 2017-08-22 LAB — PROTIME-INR
INR: 1.01
PROTHROMBIN TIME: 13.2 s (ref 11.4–15.2)

## 2017-08-22 SURGERY — LEFT HEART CATH AND CORS/GRAFTS ANGIOGRAPHY
Anesthesia: LOCAL

## 2017-08-22 MED ORDER — FENTANYL CITRATE (PF) 100 MCG/2ML IJ SOLN
INTRAMUSCULAR | Status: DC | PRN
  Administered 2017-08-22: 25 ug via INTRAVENOUS

## 2017-08-22 MED ORDER — MIDAZOLAM HCL 2 MG/2ML IJ SOLN
INTRAMUSCULAR | Status: DC | PRN
  Administered 2017-08-22: 1 mg via INTRAVENOUS

## 2017-08-22 MED ORDER — FENTANYL CITRATE (PF) 100 MCG/2ML IJ SOLN
INTRAMUSCULAR | Status: AC
  Filled 2017-08-22: qty 2

## 2017-08-22 MED ORDER — HEPARIN SODIUM (PORCINE) 1000 UNIT/ML IJ SOLN
INTRAMUSCULAR | Status: DC | PRN
  Administered 2017-08-22: 4000 [IU] via INTRAVENOUS

## 2017-08-22 MED ORDER — SODIUM CHLORIDE 0.9 % WEIGHT BASED INFUSION
1.0000 mL/kg/h | INTRAVENOUS | Status: AC
  Administered 2017-08-22: 1 mL/kg/h via INTRAVENOUS

## 2017-08-22 MED ORDER — HEPARIN SODIUM (PORCINE) 1000 UNIT/ML IJ SOLN
INTRAMUSCULAR | Status: AC
  Filled 2017-08-22: qty 1

## 2017-08-22 MED ORDER — SODIUM CHLORIDE 0.9% FLUSH: 3.0000 mL | Freq: Two times a day (BID) | INTRAVENOUS | Status: DC

## 2017-08-22 MED ORDER — LIDOCAINE HCL (PF) 1 % IJ SOLN
INTRAMUSCULAR | Status: AC
  Filled 2017-08-22: qty 30

## 2017-08-22 MED ORDER — METFORMIN HCL ER 500 MG PO TB24: 1000.0000 mg | ORAL_TABLET | Freq: Every day | ORAL | 2 refills | Status: DC

## 2017-08-22 MED ORDER — IOPAMIDOL (ISOVUE-370) INJECTION 76%
INTRAVENOUS | Status: AC
  Filled 2017-08-22: qty 125

## 2017-08-22 MED ORDER — HEPARIN (PORCINE) IN NACL 2-0.9 UNIT/ML-% IJ SOLN
INTRAMUSCULAR | Status: AC
  Filled 2017-08-22: qty 1000

## 2017-08-22 MED ORDER — VERAPAMIL HCL 2.5 MG/ML IV SOLN
INTRAVENOUS | Status: DC | PRN
  Administered 2017-08-22: 10 mL via INTRA_ARTERIAL

## 2017-08-22 MED ORDER — SODIUM CHLORIDE 0.9 % IV SOLN: 250.0000 mL | INTRAVENOUS | Status: DC | PRN

## 2017-08-22 MED ORDER — VERAPAMIL HCL 2.5 MG/ML IV SOLN
INTRAVENOUS | Status: AC
  Filled 2017-08-22: qty 2

## 2017-08-22 MED ORDER — IOPAMIDOL (ISOVUE-370) INJECTION 76%
INTRAVENOUS | Status: DC | PRN
  Administered 2017-08-22: 75 mL via INTRA_ARTERIAL

## 2017-08-22 MED ORDER — LIDOCAINE HCL (PF) 1 % IJ SOLN
INTRAMUSCULAR | Status: DC | PRN
  Administered 2017-08-22: 2 mL

## 2017-08-22 MED ORDER — SODIUM CHLORIDE 0.9% FLUSH: 3.0000 mL | INTRAVENOUS | Status: DC | PRN

## 2017-08-22 MED ORDER — MIDAZOLAM HCL 2 MG/2ML IJ SOLN
INTRAMUSCULAR | Status: AC
  Filled 2017-08-22: qty 2

## 2017-08-22 MED ORDER — HEPARIN (PORCINE) IN NACL 2-0.9 UNIT/ML-% IJ SOLN
INTRAMUSCULAR | Status: AC | PRN
  Administered 2017-08-22: 1000 mL

## 2017-08-22 SURGICAL SUPPLY — 11 items
CATH INFINITI 5 FR IM (CATHETERS) ×2 IMPLANT
CATH INFINITI 5FR MULTPACK ANG (CATHETERS) ×2 IMPLANT
DEVICE RAD COMP TR BAND LRG (VASCULAR PRODUCTS) ×2 IMPLANT
GLIDESHEATH SLEND SS 6F .021 (SHEATH) ×2 IMPLANT
GUIDEWIRE INQWIRE 1.5J.035X260 (WIRE) ×1 IMPLANT
INQWIRE 1.5J .035X260CM (WIRE) ×2
KIT HEART LEFT (KITS) ×2 IMPLANT
PACK CARDIAC CATHETERIZATION (CUSTOM PROCEDURE TRAY) ×2 IMPLANT
SYR MEDRAD MARK V 150ML (SYRINGE) ×2 IMPLANT
TRANSDUCER W/STOPCOCK (MISCELLANEOUS) ×2 IMPLANT
TUBING CIL FLEX 10 FLL-RA (TUBING) ×2 IMPLANT

## 2017-08-22 NOTE — Progress Notes (Signed)
Discharge instructions reviewed with pt and multiple family members at beside, including spouse. All questions answered and pt/family express no concerns at this time. No change since previously charted assessment. PIV x 2 removed and pt discharged to lobby with family via wheelchair and NT.

## 2017-08-22 NOTE — Progress Notes (Signed)
[id number] Did not walk with pt as he still had air in wrist band. Encouraged him to walk with staff prior to discharge. Cardiac Rehab ordered prior to cath. Brief ed done. Gave pt heart healthy and diabetic diets. Reviewed NTG use. Pt attended CRP 2 when he had CABG. Encouraged him to discuss with cardiologist if he wants to do again. He may qualify for stable angina diagnosis. Encouraged him to not lift over 5 llb with wrist. Discussed with pt not getting out shoveling in the snow this winter as family states he loves to do activities outside. Graylon Good RN BSN 08/22/2017 2:11 PM

## 2017-08-22 NOTE — Care Management CC44 (Signed)
Condition Code 44 Documentation Completed  Patient Details  Name: Michael Johnston MRN: [id number] Date of Birth: 11/12/1931   Condition Code 44 given:  Yes Patient signature on Condition Code 44 notice:  Yes Documentation of 2 MD's agreement:  Yes Code 44 added to claim:  Yes    Erenest Rasher, RN 08/22/2017, 2:30 PM

## 2017-08-22 NOTE — Care Management Obs Status (Signed)
[place] NOTIFICATION   Patient Details  Name: Michael Johnston MRN: [id number] Date of Birth: [birth year]   Medicare Observation Status Notification Given:  Yes    Erenest Rasher, RN 08/22/2017, 2:30 PM

## 2017-08-22 NOTE — Interval H&P Note (Signed)
History and Physical Interval Note:  08/22/2017 8:26 AM  Michael Johnston  has presented today for surgery, with the diagnosis of cp  The various methods of treatment have been discussed with the patient and family. After consideration of risks, benefits and other options for treatment, the patient has consented to  Procedure(s): LEFT HEART CATH AND CORS/GRAFTS ANGIOGRAPHY (N/A) as a surgical intervention .  The patient's history has been reviewed, patient examined, no change in status, stable for surgery.  I have reviewed the patient's chart and labs.  Questions were answered to the patient's satisfaction.    Cath Lab Visit (complete for each Cath Lab visit)  Clinical Evaluation Leading to the Procedure:   ACS: Yes.    Non-ACS:    Anginal Classification: CCS III  Anti-ischemic medical therapy: Minimal Therapy (1 class of medications)  Non-Invasive Test Results: No non-invasive testing performed  Prior CABG: Previous CABG       [name] [facility] 08/22/2017 8:26 AM

## 2017-08-22 NOTE — Progress Notes (Signed)
Called into pt's room by NT for bleeding at left radial cath site. Upon entering room another RN was in pt's room and had added 41ml into TR band and bleeding stopped. Pt and family at bedside educated again on activity restrictions; will continue to monitor closely.

## 2017-08-22 NOTE — Discharge Summary (Signed)
Discharge Summary    Patient ID: Michael Johnston,  MRN: [id number], DOB/AGE: 02/10/1932 83 y.o.  Admit date: 08/21/2017 Discharge date: 08/22/2017   Primary Care Provider: Marletta Lor Primary Cardiologist: Dr. Johnsie Cancel  Discharge Diagnoses    Principal Problem:   Chest pain Active Problems:   Mixed hyperlipidemia   GERD (gastroesophageal reflux disease)   FH: CABG (coronary artery bypass surgery)   CAD (coronary artery disease)   Carotid artery stenosis   Type 2 diabetes mellitus with neurological complications (HCC)   Unstable angina (HCC)   Allergies Allergies  Allergen Reactions  . Levaquin [Levofloxacin In D5w] Swelling and Other (See Comments)    Eyes swollen  . Other Anaphylaxis and Other (See Comments)    Bee sting     History of Present Illness     83 y.o. male with previous CABG 2012. Had STEMI at dentist office 09/2010 had LIMA to LAD, SVG to D1, SVG to OM. The RCA was not bypassed. EF has been normal 55-60% by echo 2016. Myovue 2014 normal with EF 71%.  Also with carotid plaque no stenosis Presented with SSCP after starting a hand weights exercise routine.  He awoke on the morning of 08/21/17 with SSCP and dyspnea. Pain briefly pleuritic. Did not resolve with first nitro but did after EMS gave him 3rd with ASA. Currently pain free. Troponin negative, CXR with NAD, ECG with no acute changes. No cough, fever, or sputum. Pain did not appear muscular or worse in any position or movement. He does not remember what pain was like with his STEMI 6 years ago.   Hospital Course     Consultants: none  He was admitted to cardiology. Troponins were trended and remained negative. EKG without acute ischemic changes. Given his known disease, he was taken to the cath lab 08/22/17. Left heart cath with stable anatomy (see below) with patent grafts and normal EF. No intervention.  He tolerated the procedure well and was discharged after his rest time and TR band  removed. No medication changes to his home regimen.  Patient seen and examined by Dr. Johnsie Cancel today and was stable for discharge. All follow up has been arranged.  _____________  Discharge Vitals Blood pressure (!) 117/55, pulse 64, temperature 98.2 F (36.8 C), temperature source Oral, resp. rate 20, height 5\' 11"  (1.803 m), weight 183 lb 12.8 oz (83.4 kg), SpO2 95 %.  Filed Weights   08/21/17 0834 08/21/17 1855 08/22/17 0354  Weight: 186 lb (84.4 kg) 186 lb (84.4 kg) 183 lb 12.8 oz (83.4 kg)    Physical Exam  Constitutional: He is oriented to person, place, and time. He appears well-developed and well-nourished. No distress.  HENT:  Head: Normocephalic.  Neck: No JVD present.  Cardiovascular: Normal rate, regular rhythm and intact distal pulses.  No murmur heard. Pulmonary/Chest: He has no decreased breath sounds. He has no wheezes. He has no rhonchi.  Abdominal: Soft. Bowel sounds are normal. There is no tenderness.  Musculoskeletal:       Left lower leg: He exhibits no edema.  Neurological: He is alert and oriented to person, place, and time.  Skin: Skin is warm and dry.    Labs & Radiologic Studies    CBC Recent Labs    08/21/17 0844 08/21/17 1758 08/22/17 0506  WBC 5.0 5.9 5.2  NEUTROABS 2.9  --   --   HGB 12.9* 13.8 12.8*  HCT 38.8* 40.6 39.0  MCV 97.2 97.4 97.0  PLT 137* 142* 831*   Basic Metabolic Panel Recent Labs    08/21/17 0844 08/21/17 1758 08/22/17 0506  NA 138  --  138  K 4.4  --  4.4  CL 105  --  104  CO2 27  --  27  GLUCOSE 140*  --  124*  BUN 19  --  21*  CREATININE 0.71 0.98 0.81  CALCIUM 8.8*  --  9.2   Liver Function Tests No results for input(s): AST, ALT, ALKPHOS, BILITOT, PROT, ALBUMIN in the last 72 hours. No results for input(s): LIPASE, AMYLASE in the last 72 hours. Cardiac Enzymes Recent Labs    08/21/17 1758 08/22/17 0003 08/22/17 0506  TROPONINI <0.03 <0.03 <0.03   BNP Invalid input(s): POCBNP D-Dimer No results  for input(s): DDIMER in the last 72 hours. Hemoglobin A1C No results for input(s): HGBA1C in the last 72 hours. Fasting Lipid Panel Recent Labs    08/22/17 0003  CHOL 100  HDL 46  LDLCALC 37  TRIG 83  CHOLHDL 2.2   Thyroid Function Tests No results for input(s): TSH, T4TOTAL, T3FREE, THYROIDAB in the last 72 hours.  Invalid input(s): FREET3 _____________  Dg Chest 2 View  Result Date: 08/21/2017 CLINICAL DATA:  Chest pain EXAM: CHEST  2 VIEW COMPARISON:  March 27, 2017 FINDINGS: There is mild scarring in the lateral left base, stable. There is no edema or consolidation. Heart size and pulmonary vascularity are normal. No adenopathy. Patient is status post coronary artery bypass grafting. There is aortic atherosclerosis. No bone lesions are evident. IMPRESSION: Stable scarring lateral left base. No edema or consolidation. Stable cardiac silhouette. There is aortic atherosclerosis. Aortic Atherosclerosis (ICD10-I70.0). Electronically Signed   By: Lowella Grip III M.D.   On: 08/21/2017 09:09     Diagnostic Studies/Procedures    Left heart cath 08/22/17:  Mid RCA lesion is 30% stenosed.  Ost 1st Mrg lesion is 100% stenosed.  Prox LAD lesion is 100% stenosed.  Ost 1st Diag lesion is 100% stenosed.  SVG graft was visualized by angiography and is normal in caliber.  The graft exhibits no disease.  LIMA graft was visualized by angiography and is normal in caliber.  The graft exhibits no disease.  SVG graft was visualized by angiography and is normal in caliber.  The graft exhibits no disease.  The left ventricular systolic function is normal.  LV end diastolic pressure is normal.  The left ventricular ejection fraction is 55-65% by visual estimate.   1. 2 vessel occlusive CAD 2. Patent LIMA to the LAD 3. Patent SVG to the first diagonal 4. Patent SVG to the first OM 5. Normal LV function 6. Normal LVEDP  Plan: continue medical therapy. Anticipate DC later  today.   Disposition   Pt is being discharged home today in good condition.  Follow-up Plans & Appointments     Discharge Instructions    Diet - low sodium heart healthy   Complete by:  As directed    Discharge instructions   Complete by:  As directed    No driving for 2 days. No lifting over 5 lbs for 1 week. No sexual activity for 1 week.  Keep procedure site clean & dry. If you notice increased pain, swelling, bleeding or pus, call/return!  You may shower, but no soaking baths/hot tubs/pools for 1 week.   Increase activity slowly   Complete by:  As directed       Discharge Medications   Allergies as of 08/22/2017  Reactions   Levaquin [levofloxacin In D5w] Swelling, Other (See Comments)   Eyes swollen   Other Anaphylaxis, Other (See Comments)   Bee sting      Medication List    TAKE these medications   aspirin 81 MG tablet Take 81 mg by mouth daily.   atorvastatin 20 MG tablet Commonly known as:  LIPITOR TAKE 1 TABLET (20 MG TOTAL) BY MOUTH DAILY.   cyanocobalamin 1000 MCG/ML injection Commonly known as:  (VITAMIN B-12) INJECT 1ML ONCE A MONTH THEREAFTER   EPINEPHrine 0.3 mg/0.3 mL Soaj injection Commonly known as:  EPIPEN 2-PAK Inject 0.3 mLs (0.3 mg total) into the muscle once.   ketoconazole 2 % shampoo Commonly known as:  NIZORAL APPLY EVERY OTHER DAY TO SCALP--LATHER 5 MINUTES,THEN RINSE OUT   metFORMIN 500 MG 24 hr tablet Commonly known as:  GLUCOPHAGE-XR Take 2 tablets (1,000 mg total) by mouth daily with breakfast. Resume on 08/25/17. What changed:  additional instructions   metoprolol tartrate 25 MG tablet Commonly known as:  LOPRESSOR TAKE 1 TABLET (25 MG TOTAL) BY MOUTH 2 (TWO) TIMES DAILY.   nitroGLYCERIN 0.4 MG SL tablet Commonly known as:  NITROSTAT Place 1 tablet (0.4 mg total) under the tongue every 5 (five) minutes as needed for chest pain (3 doses max).   PRESERVISION AREDS 2 PO Take 1 tablet by mouth daily.           Outstanding Labs/Studies   Follow up in our office in 2-3 weeks, staff message sent  Duration of Discharge Encounter   Greater than 30 minutes including physician time.  Signed, Tami Lin Rennee Coyne PA-C 08/22/2017, 3:34 PM

## 2017-08-26 NOTE — Consult Note (Signed)
            [facility] CM Primary Care Navigator  08/26/2017  Michael Johnston 1931-11-20 [id number]   Attempt to seepatient at the bedsideto identify possible discharge needs buthe was alreadydischarged.   Patient was discharged homelast Friday 12/14.  Primary care provider's officeis listed asprovidingtransition of care (TOC) follow-up.  Patient also has discharge instruction to follow-up with cardiology in 2-3 weeks since he was admitted for chest pain and had cardiac catheterization.   For additional questions please contact:  Edwena Felty A. Bahar Shelden, BSN, RN-BC [facility] PRIMARY CARE Navigator Cell: [phone]

## 2017-09-16 ENCOUNTER — Ambulatory Visit (INDEPENDENT_AMBULATORY_CARE_PROVIDER_SITE_OTHER): Payer: Medicare Other | Admitting: Internal Medicine

## 2017-09-16 ENCOUNTER — Encounter: Payer: Self-pay | Admitting: Internal Medicine

## 2017-09-16 VITALS — BP 120/64 | HR 60 | Temp 97.9°F | Ht 71.0 in | Wt 188.2 lb

## 2017-09-16 DIAGNOSIS — I2510 Atherosclerotic heart disease of native coronary artery without angina pectoris: Secondary | ICD-10-CM

## 2017-09-16 DIAGNOSIS — R079 Chest pain, unspecified: Secondary | ICD-10-CM

## 2017-09-16 DIAGNOSIS — E782 Mixed hyperlipidemia: Secondary | ICD-10-CM | POA: Diagnosis not present

## 2017-09-16 DIAGNOSIS — E1149 Type 2 diabetes mellitus with other diabetic neurological complication: Secondary | ICD-10-CM

## 2017-09-16 LAB — POCT GLYCOSYLATED HEMOGLOBIN (HGB A1C): Hemoglobin A1C: 6.3

## 2017-09-16 NOTE — Patient Instructions (Signed)
Limit your sodium (Salt) intake   Please check your hemoglobin A1c every 3-6 months   

## 2017-09-16 NOTE — Progress Notes (Signed)
Subjective:    Patient ID: Michael Johnston, male    DOB: 10/03/1931, 84 y.o.   MRN: [id number]  HPI  84 year old patient who has a history of coronary artery disease status post CABG.  He was admitted to the hospital 1 month ago for chest pain.  He underwent heart catheterization which revealed stable coronary artery disease.  He has had no recurrent chest pain.  He continues to do well He has a history of type 2 diabetes complicated by neuropathy he also has a history of B12 deficiency and is on monthly B12 injections.  He is on statin therapy  Past Medical History:  Diagnosis Date  . Arthritis    "right shoulder" (05/24/2015)  . CHEST PAIN   . Chronic bronchitis ([place])    "get it ~ q yr" (05/24/2015)  . COLONIC POLYPS, HX OF   . COPD (chronic obstructive pulmonary disease) ([place])    "dx'd but I don't take RX for it" (05/24/2015)  . Coronary artery disease   . Cystic kidney disease   . GERD (gastroesophageal reflux disease)   . History of hiatal hernia   . MI (myocardial infarction) ([place]) 09/2010  . Mixed hyperlipidemia   . Skin cancer    "top of my head only" (05/24/2015)  . Sleep apnea   . Syncope and collapse ~ 2013; 05/24/2015     Social History   Socioeconomic History  . Marital status: Married    Spouse name: Not on file  . Number of children: Not on file  . Years of education: Not on file  . Highest education level: Not on file  Social Needs  . Financial resource strain: Not on file  . Food insecurity - worry: Not on file  . Food insecurity - inability: Not on file  . Transportation needs - medical: Not on file  . Transportation needs - non-medical: Not on file  Occupational History  . Not on file  Tobacco Use  . Smoking status: Former Smoker    Packs/day: 1.50    Years: 3.00    Pack years: 4.50    Types: Cigarettes    Last attempt to quit: 12/09/1951    Years since quitting: 65.8  . Smokeless tobacco: Never Used  Substance and Sexual Activity  . Alcohol  use: Yes    Comment: "no alcohol since 1953"  . Drug use: No  . Sexual activity: Not on file  Other Topics Concern  . Not on file  Social History Narrative  . Not on file    Past Surgical History:  Procedure Laterality Date  . CARDIAC CATHETERIZATION  09/2010  . CHOLECYSTECTOMY OPEN  1998  . COLECTOMY  1998   "partial"  . CORONARY ANGIOPLASTY    . CORONARY ARTERY BYPASS GRAFT  09/2010   Median sternotomy for coronary artery bypass grafting x3  (left internal mammary artery to distal left anterior  descending  coronary artery, saphenous vein graft to first diagonal branch,  saphenous vein graft to first  obtuse marginal branch, endoscopic  saphenous vein harvest from right thigh). SURGEON:  Valentina Gu.  Roxy Manns, MD  ASSISTANT:  John Giovanni, PA-C  ANESTHESIA:  Glynda Jaeger, MD   . LEFT HEART CATH AND CORS/GRAFTS ANGIOGRAPHY N/A 08/22/2017   Procedure: LEFT HEART CATH AND CORS/GRAFTS ANGIOGRAPHY;  Surgeon: Martinique, Peter M, MD;  Location: [facility];  Service: Cardiovascular;  Laterality: N/A;  . SKIN CANCER EXCISION     "top of my head"  Family History  Problem Relation Age of Onset  . Lung cancer Sister   . Heart attack Father     Allergies  Allergen Reactions  . Levaquin [Levofloxacin In D5w] Swelling and Other (See Comments)    Eyes swollen  . Other Anaphylaxis and Other (See Comments)    Bee sting    Current Outpatient Medications on File Prior to Visit  Medication Sig Dispense Refill  . aspirin 81 MG tablet Take 81 mg by mouth daily.    Marland Kitchen atorvastatin (LIPITOR) 20 MG tablet TAKE 1 TABLET (20 MG TOTAL) BY MOUTH DAILY. 90 tablet 1  . cyanocobalamin (,VITAMIN B-12,) 1000 MCG/ML injection INJECT 1ML ONCE A MONTH THEREAFTER  1  . EPINEPHrine (EPIPEN 2-PAK) 0.3 mg/0.3 mL IJ SOAJ injection Inject 0.3 mLs (0.3 mg total) into the muscle once. 2 Device 0  . ketoconazole (NIZORAL) 2 % shampoo APPLY EVERY OTHER DAY TO SCALP--LATHER 5 MINUTES,THEN RINSE OUT 120 mL 5  .  metFORMIN (GLUCOPHAGE-XR) 500 MG 24 hr tablet Take 2 tablets (1,000 mg total) by mouth daily with breakfast. Resume on 08/25/17. 180 tablet 2  . metoprolol tartrate (LOPRESSOR) 25 MG tablet TAKE 1 TABLET (25 MG TOTAL) BY MOUTH 2 (TWO) TIMES DAILY. 180 tablet 3  . Multiple Vitamins-Minerals (PRESERVISION AREDS 2 PO) Take 1 tablet by mouth daily.    . nitroGLYCERIN (NITROSTAT) 0.4 MG SL tablet Place 1 tablet (0.4 mg total) under the tongue every 5 (five) minutes as needed for chest pain (3 doses max). 25 tablet 1   No current facility-administered medications on file prior to visit.     BP 120/64 (BP Location: Left Arm, Patient Position: Sitting, Cuff Size: Normal)   Pulse 60   Temp 97.9 F (36.6 C) (Oral)   Ht 5\' 11"  (1.803 m)   Wt 188 lb 3.2 oz (85.4 kg)   SpO2 98%   BMI 26.25 kg/m     Review of Systems  Constitutional: Negative for appetite change, chills, fatigue and fever.  HENT: Negative for congestion, dental problem, ear pain, hearing loss, sore throat, tinnitus, trouble swallowing and voice change.   Eyes: Negative for pain, discharge and visual disturbance.  Respiratory: Negative for cough, chest tightness, wheezing and stridor.   Cardiovascular: Negative for chest pain, palpitations and leg swelling.  Gastrointestinal: Negative for abdominal distention, abdominal pain, blood in stool, constipation, diarrhea, nausea and vomiting.  Genitourinary: Negative for difficulty urinating, discharge, flank pain, genital sores, hematuria and urgency.  Musculoskeletal: Negative for arthralgias, back pain, gait problem, joint swelling, myalgias and neck stiffness.  Skin: Negative for rash.  Neurological: Negative for dizziness, syncope, speech difficulty, weakness, numbness and headaches.  Hematological: Negative for adenopathy. Does not bruise/bleed easily.  Psychiatric/Behavioral: Negative for behavioral problems and dysphoric mood. The patient is not nervous/anxious.          Objective:   Physical Exam  Constitutional: He is oriented to person, place, and time. He appears well-developed.  HENT:  Head: Normocephalic.  Right Ear: External ear normal.  Left Ear: External ear normal.  Mouth/Throat: Oropharynx is clear and moist.  Eyes: Conjunctivae and EOM are normal.  Neck: Normal range of motion.  Cardiovascular: Normal rate and normal heart sounds.  Pulmonary/Chest: Breath sounds normal.  Abdominal: Bowel sounds are normal.  Musculoskeletal: Normal range of motion. He exhibits no edema or tenderness.  Neurological: He is alert and oriented to person, place, and time.  Psychiatric: He has a normal mood and affect. His behavior is normal.  Assessment & Plan:  History of chest pain. This has not reoccurred  Diabetes mellitus.  Will review hemoglobin A1c follow-up 6 months Coronary artery disease stable Dyslipidemia continue statin therapy Gastroesophageal reflux disease  Medications updated Hospital records reviewed No change in medical regimen  Nyoka Cowden

## 2017-09-16 NOTE — Addendum Note (Signed)
Addended by: Abelardo Diesel on: 09/16/2017 02:35 PM   Modules accepted: Orders

## 2017-09-23 ENCOUNTER — Encounter: Payer: Self-pay | Admitting: Cardiovascular Disease

## 2017-09-30 DIAGNOSIS — C44622 Squamous cell carcinoma of skin of right upper limb, including shoulder: Secondary | ICD-10-CM | POA: Diagnosis not present

## 2017-10-06 NOTE — Progress Notes (Signed)
Patient ID: Michael Johnston, male   DOB: 03/09/1932, 84 y.o.   MRN: [id number]   Michael Johnston is seen for F/U CAD SEMI after getting injections at the dentist office September 27, 2010. Subsequently found to have 3VD needing CABG: LIMA to LAD, SVG to D1, SVG to OM  RCA was not grafted    Duplex in 12/27/13  reviewed showed 40-59% bilateral carotid disease. 05/25/15 duplex read by Dr Leonie Man 1-39% ICA plaque no stenosis  F/U myovue 11/14 normal EF 71% Stopping statin made no difference with memory so he is back on it   Seen in hospital 05/2015 Syncope this was in setting of fasting and a wasp sting.  Echo EF 55-60%  Admitted 08/21/17 for chest pain  R/O CXR NAD no acute ECG changes Cath done by Dr Martinique 08/22/17 patent Grafts normal EF and EDP RCA with no obstructive Disease   No issues since d/c   ROS: Denies fever, malais, weight loss, blurry vision, decreased visual acuity, cough, sputum, SOB, hemoptysis, pleuritic pain, palpitaitons, heartburn, abdominal pain, melena, lower extremity edema, claudication, or rash.  All other systems reviewed and negative  General: There were no vitals taken for this visit. Affect appropriate Healthy:  appears stated age 21: normal Neck supple with no adenopathy JVP normal no bruits no thyromegaly Lungs clear with no wheezing and good diaphragmatic motion Heart:  S1/S2 no murmur, no rub, gallop or click PMI normal post sternotomy  Abdomen: benighn, BS positve, no tenderness, no AAA no bruit.  No HSM or HJR Distal pulses intact with no bruits No edema Neuro non-focal Skin warm and dry No muscular weakness Left radial cath sight A     Current Outpatient Medications  Medication Sig Dispense Refill  . aspirin 81 MG tablet Take 81 mg by mouth daily.    Marland Kitchen atorvastatin (LIPITOR) 20 MG tablet TAKE 1 TABLET (20 MG TOTAL) BY MOUTH DAILY. 90 tablet 1  . cyanocobalamin (,VITAMIN B-12,) 1000 MCG/ML injection INJECT 1ML ONCE A MONTH THEREAFTER  1  . EPINEPHrine  (EPIPEN 2-PAK) 0.3 mg/0.3 mL IJ SOAJ injection Inject 0.3 mLs (0.3 mg total) into the muscle once. 2 Device 0  . ketoconazole (NIZORAL) 2 % shampoo APPLY EVERY OTHER DAY TO SCALP--LATHER 5 MINUTES,THEN RINSE OUT 120 mL 5  . metFORMIN (GLUCOPHAGE-XR) 500 MG 24 hr tablet Take 2 tablets (1,000 mg total) by mouth daily with breakfast. Resume on 08/25/17. 180 tablet 2  . metoprolol tartrate (LOPRESSOR) 25 MG tablet TAKE 1 TABLET (25 MG TOTAL) BY MOUTH 2 (TWO) TIMES DAILY. 180 tablet 3  . Multiple Vitamins-Minerals (PRESERVISION AREDS 2 PO) Take 1 tablet by mouth daily.    . nitroGLYCERIN (NITROSTAT) 0.4 MG SL tablet Place 1 tablet (0.4 mg total) under the tongue every 5 (five) minutes as needed for chest pain (3 doses max). 25 tablet 1   No current facility-administered medications for this visit.     Allergies  Levaquin [levofloxacin in d5w] and Other  Electrocardiogram:   01/26/14  SR rate 54 normal  01/26/15  SR rate 64  LAD LVH no significant change  10/16/16  SR ate 52 PAC LVH nonspecific ST changes   Assessment and Plan  Syncope: related to wasp sting Has Epi Pen now  CAD:  CABG 2012  Cath 08/22/17 patent grafts no new disease in RCA normal EF continue medical Rx HTN:  Well controlled.  Continue current medications and low sodium Dash type diet.   Chol:   Cholesterol is at  goal.  Continue current dose of statin and diet Rx.  No myalgias or side effects.  F/U  LFT's in 6 months. Lab Results  Component Value Date   LDLCALC 37 08/22/2017   DM:  Discussed low carb diet.  Target hemoglobin A1c is 6.5 or less.  Continue current medications.  Carotid:  Duplex 12/28/15 plaque no stenosis f/u 12/2017         F/U with me in a year   [name]

## 2017-10-08 ENCOUNTER — Ambulatory Visit: Payer: Medicare Other | Admitting: Cardiovascular Disease

## 2017-10-08 ENCOUNTER — Encounter: Payer: Self-pay | Admitting: Cardiovascular Disease

## 2017-10-08 VITALS — BP 116/74 | HR 69 | Ht 71.0 in | Wt 185.5 lb

## 2017-10-08 DIAGNOSIS — E782 Mixed hyperlipidemia: Secondary | ICD-10-CM | POA: Diagnosis not present

## 2017-10-08 DIAGNOSIS — I10 Essential (primary) hypertension: Secondary | ICD-10-CM | POA: Diagnosis not present

## 2017-10-08 DIAGNOSIS — I2583 Coronary atherosclerosis due to lipid rich plaque: Secondary | ICD-10-CM | POA: Diagnosis not present

## 2017-10-08 DIAGNOSIS — I2510 Atherosclerotic heart disease of native coronary artery without angina pectoris: Secondary | ICD-10-CM

## 2017-10-08 NOTE — Patient Instructions (Signed)

## 2017-10-14 ENCOUNTER — Other Ambulatory Visit: Payer: Self-pay | Admitting: Internal Medicine

## 2017-10-27 DIAGNOSIS — N138 Other obstructive and reflux uropathy: Secondary | ICD-10-CM | POA: Diagnosis not present

## 2017-10-27 DIAGNOSIS — N281 Cyst of kidney, acquired: Secondary | ICD-10-CM | POA: Diagnosis not present

## 2017-10-27 DIAGNOSIS — N401 Enlarged prostate with lower urinary tract symptoms: Secondary | ICD-10-CM | POA: Diagnosis not present

## 2017-12-08 DIAGNOSIS — L905 Scar conditions and fibrosis of skin: Secondary | ICD-10-CM | POA: Diagnosis not present

## 2017-12-08 DIAGNOSIS — D485 Neoplasm of uncertain behavior of skin: Secondary | ICD-10-CM | POA: Diagnosis not present

## 2017-12-08 DIAGNOSIS — L570 Actinic keratosis: Secondary | ICD-10-CM | POA: Diagnosis not present

## 2017-12-10 ENCOUNTER — Other Ambulatory Visit: Payer: Self-pay | Admitting: Internal Medicine

## 2018-02-22 DIAGNOSIS — R109 Unspecified abdominal pain: Secondary | ICD-10-CM | POA: Insufficient documentation

## 2018-02-23 DIAGNOSIS — R39198 Other difficulties with micturition: Secondary | ICD-10-CM | POA: Insufficient documentation

## 2018-02-23 DIAGNOSIS — N401 Enlarged prostate with lower urinary tract symptoms: Secondary | ICD-10-CM | POA: Diagnosis not present

## 2018-02-23 DIAGNOSIS — N138 Other obstructive and reflux uropathy: Secondary | ICD-10-CM | POA: Diagnosis not present

## 2018-02-23 DIAGNOSIS — R109 Unspecified abdominal pain: Secondary | ICD-10-CM | POA: Diagnosis not present

## 2018-02-23 DIAGNOSIS — N281 Cyst of kidney, acquired: Secondary | ICD-10-CM | POA: Diagnosis not present

## 2018-03-03 ENCOUNTER — Emergency Department (HOSPITAL_COMMUNITY): Payer: Medicare Other

## 2018-03-03 ENCOUNTER — Encounter (HOSPITAL_COMMUNITY): Payer: Self-pay

## 2018-03-03 ENCOUNTER — Emergency Department (HOSPITAL_COMMUNITY)
Admission: EM | Admit: 2018-03-03 | Discharge: 2018-03-03 | Disposition: A | Discharge status: Home / Self Care | Payer: Medicare Other | Attending: Emergency Medicine | Admitting: Emergency Medicine

## 2018-03-03 ENCOUNTER — Other Ambulatory Visit: Payer: Self-pay

## 2018-03-03 DIAGNOSIS — Z9049 Acquired absence of other specified parts of digestive tract: Secondary | ICD-10-CM | POA: Insufficient documentation

## 2018-03-03 DIAGNOSIS — Z79899 Other long term (current) drug therapy: Secondary | ICD-10-CM | POA: Diagnosis not present

## 2018-03-03 DIAGNOSIS — Y998 Other external cause status: Secondary | ICD-10-CM | POA: Diagnosis not present

## 2018-03-03 DIAGNOSIS — R4020 Unspecified coma: Secondary | ICD-10-CM | POA: Diagnosis not present

## 2018-03-03 DIAGNOSIS — Z7984 Long term (current) use of oral hypoglycemic drugs: Secondary | ICD-10-CM | POA: Insufficient documentation

## 2018-03-03 DIAGNOSIS — J449 Chronic obstructive pulmonary disease, unspecified: Secondary | ICD-10-CM | POA: Insufficient documentation

## 2018-03-03 DIAGNOSIS — Y92009 Unspecified place in unspecified non-institutional (private) residence as the place of occurrence of the external cause: Secondary | ICD-10-CM | POA: Insufficient documentation

## 2018-03-03 DIAGNOSIS — I252 Old myocardial infarction: Secondary | ICD-10-CM | POA: Insufficient documentation

## 2018-03-03 DIAGNOSIS — W57XXXA Bitten or stung by nonvenomous insect and other nonvenomous arthropods, initial encounter: Secondary | ICD-10-CM | POA: Insufficient documentation

## 2018-03-03 DIAGNOSIS — Z7982 Long term (current) use of aspirin: Secondary | ICD-10-CM | POA: Diagnosis not present

## 2018-03-03 DIAGNOSIS — S60561A Insect bite (nonvenomous) of right hand, initial encounter: Secondary | ICD-10-CM | POA: Diagnosis present

## 2018-03-03 DIAGNOSIS — Z85828 Personal history of other malignant neoplasm of skin: Secondary | ICD-10-CM | POA: Diagnosis not present

## 2018-03-03 DIAGNOSIS — Z951 Presence of aortocoronary bypass graft: Secondary | ICD-10-CM | POA: Insufficient documentation

## 2018-03-03 DIAGNOSIS — I4510 Unspecified right bundle-branch block: Secondary | ICD-10-CM | POA: Diagnosis not present

## 2018-03-03 DIAGNOSIS — E119 Type 2 diabetes mellitus without complications: Secondary | ICD-10-CM | POA: Diagnosis not present

## 2018-03-03 DIAGNOSIS — Z87891 Personal history of nicotine dependence: Secondary | ICD-10-CM | POA: Diagnosis not present

## 2018-03-03 DIAGNOSIS — Y9389 Activity, other specified: Secondary | ICD-10-CM | POA: Diagnosis not present

## 2018-03-03 DIAGNOSIS — I2510 Atherosclerotic heart disease of native coronary artery without angina pectoris: Secondary | ICD-10-CM | POA: Diagnosis not present

## 2018-03-03 DIAGNOSIS — T782XXA Anaphylactic shock, unspecified, initial encounter: Secondary | ICD-10-CM | POA: Insufficient documentation

## 2018-03-03 DIAGNOSIS — R0902 Hypoxemia: Secondary | ICD-10-CM | POA: Diagnosis not present

## 2018-03-03 DIAGNOSIS — R402441 Other coma, without documented Glasgow coma scale score, or with partial score reported, in the field [EMT or ambulance]: Secondary | ICD-10-CM | POA: Diagnosis not present

## 2018-03-03 DIAGNOSIS — I959 Hypotension, unspecified: Secondary | ICD-10-CM | POA: Diagnosis not present

## 2018-03-03 DIAGNOSIS — R0689 Other abnormalities of breathing: Secondary | ICD-10-CM | POA: Diagnosis not present

## 2018-03-03 DIAGNOSIS — T63481A Toxic effect of venom of other arthropod, accidental (unintentional), initial encounter: Secondary | ICD-10-CM

## 2018-03-03 DIAGNOSIS — R0602 Shortness of breath: Secondary | ICD-10-CM | POA: Diagnosis not present

## 2018-03-03 DIAGNOSIS — T63441A Toxic effect of venom of bees, accidental (unintentional), initial encounter: Secondary | ICD-10-CM | POA: Diagnosis not present

## 2018-03-03 LAB — I-STAT CHEM 8, ED
BUN: 30 mg/dL — ABNORMAL HIGH (ref 8–23)
CALCIUM ION: 1.17 mmol/L (ref 1.15–1.40)
CHLORIDE: 108 mmol/L (ref 98–111)
Creatinine, Ser: 0.9 mg/dL (ref 0.61–1.24)
GLUCOSE: 169 mg/dL — AB (ref 70–99)
HCT: 35 % — ABNORMAL LOW (ref 39.0–52.0)
HEMOGLOBIN: 11.9 g/dL — AB (ref 13.0–17.0)
Potassium: 3 mmol/L — ABNORMAL LOW (ref 3.5–5.1)
Sodium: 144 mmol/L (ref 135–145)
TCO2: 23 mmol/L (ref 22–32)

## 2018-03-03 LAB — I-STAT TROPONIN, ED: Troponin i, poc: 0 ng/mL (ref 0.00–0.08)

## 2018-03-03 LAB — CBC WITH DIFFERENTIAL/PLATELET
Basophils Absolute: 0 10*3/uL (ref 0.0–0.1)
Basophils Relative: 0 %
EOS PCT: 1 %
Eosinophils Absolute: 0.1 10*3/uL (ref 0.0–0.7)
HCT: 37.2 % — ABNORMAL LOW (ref 39.0–52.0)
Hemoglobin: 12 g/dL — ABNORMAL LOW (ref 13.0–17.0)
LYMPHS ABS: 5.9 10*3/uL — AB (ref 0.7–4.0)
LYMPHS PCT: 62 %
MCH: 32.1 pg (ref 26.0–34.0)
MCHC: 32.3 g/dL (ref 30.0–36.0)
MCV: 99.5 fL (ref 78.0–100.0)
MONO ABS: 0.3 10*3/uL (ref 0.1–1.0)
Monocytes Relative: 3 %
Neutro Abs: 3.3 10*3/uL (ref 1.7–7.7)
Neutrophils Relative %: 34 %
Platelets: 152 10*3/uL (ref 150–400)
RBC: 3.74 MIL/uL — ABNORMAL LOW (ref 4.22–5.81)
RDW: 13.1 % (ref 11.5–15.5)
WBC: 9.6 10*3/uL (ref 4.0–10.5)

## 2018-03-03 LAB — CBG MONITORING, ED: Glucose-Capillary: 191 mg/dL — ABNORMAL HIGH (ref 70–99)

## 2018-03-03 MED ORDER — DIPHENHYDRAMINE HCL 50 MG/ML IJ SOLN
25.0000 mg | Freq: Once | INTRAMUSCULAR | Status: AC
  Administered 2018-03-03: 25 mg via INTRAVENOUS
  Filled 2018-03-03: qty 1

## 2018-03-03 MED ORDER — PREDNISONE 20 MG PO TABS: 40.0000 mg | ORAL_TABLET | Freq: Every day | ORAL | 0 refills | Status: DC

## 2018-03-03 MED ORDER — ONDANSETRON HCL 4 MG/2ML IJ SOLN
4.0000 mg | Freq: Once | INTRAMUSCULAR | Status: AC
  Administered 2018-03-03: 4 mg via INTRAVENOUS
  Filled 2018-03-03: qty 2

## 2018-03-03 MED ORDER — LORAZEPAM 2 MG/ML IJ SOLN
INTRAMUSCULAR | Status: AC
  Filled 2018-03-03: qty 1

## 2018-03-03 MED ORDER — EPINEPHRINE 0.3 MG/0.3ML IJ SOAJ: 0.3000 mg | Freq: Once | INTRAMUSCULAR | 0 refills | Status: AC

## 2018-03-03 MED ORDER — FAMOTIDINE IN NACL 20-0.9 MG/50ML-% IV SOLN
20.0000 mg | Freq: Once | INTRAVENOUS | Status: AC
  Administered 2018-03-03: 20 mg via INTRAVENOUS
  Filled 2018-03-03: qty 50

## 2018-03-03 MED ORDER — LORAZEPAM 2 MG/ML IJ SOLN
0.2500 mg | Freq: Once | INTRAMUSCULAR | Status: AC
  Administered 2018-03-03: 0.25 mg via INTRAVENOUS

## 2018-03-03 MED ORDER — METHYLPREDNISOLONE SODIUM SUCC 125 MG IJ SOLR
125.0000 mg | Freq: Once | INTRAMUSCULAR | Status: AC
  Administered 2018-03-03: 125 mg via INTRAVENOUS
  Filled 2018-03-03: qty 2

## 2018-03-03 NOTE — ED Notes (Signed)
Pt resting, vss.  No shaking noted at this time.  Family at bedside.

## 2018-03-03 NOTE — ED Provider Notes (Signed)
[facility] EMERGENCY DEPARTMENT Provider Note   CSN: [id number] Arrival date & time: 03/03/18  1500     History   Chief Complaint Chief Complaint  Patient presents with  . Respiratory Distress    HPI Michael Johnston is a 84 y.o. male.  HPI Level 5 caveat due to altered mental status.  Patient had a bee sting on to his right hand at home.  Reportedly has severe allergy to bee stings.  EMS arrived and patient was unresponsive.  Had gotten 1 of EpiPen by home and then 1 by EMS.  Patient was reportedly apneic and a King airway was placed.  Patient began to vomit and king airway was removed.  Patient initially had systolic blood pressures in the 50s that had then improved. Past Medical History:  Diagnosis Date  . Arthritis    "right shoulder" (05/24/2015)  . CHEST PAIN   . Chronic bronchitis ([place])    "get it ~ q yr" (05/24/2015)  . COLONIC POLYPS, HX OF   . COPD (chronic obstructive pulmonary disease) ([place])    "dx'd but I don't take RX for it" (05/24/2015)  . Coronary artery disease   . Cystic kidney disease   . GERD (gastroesophageal reflux disease)   . History of hiatal hernia   . MI (myocardial infarction) ([place]) 09/2010  . Mixed hyperlipidemia   . Skin cancer    "top of my head only" (05/24/2015)  . Sleep apnea   . Syncope and collapse ~ 2013; 05/24/2015    Patient Active Problem List   Diagnosis Date Noted  . Chest pain 08/21/2017  . Unstable angina ([place]) 08/21/2017  . B12 deficiency 11/08/2016  . Type 2 diabetes mellitus with neurological complications ([place]) [id number]/46/9629  . Syncope and collapse 05/24/2015  . Carotid artery stenosis 05/22/2012  . CAD (coronary artery disease) 05/14/2012  . FH: CABG (coronary artery bypass surgery) 01/28/2011  . Mixed hyperlipidemia 10/23/2010  . GERD (gastroesophageal reflux disease) 10/22/2010  . History of colonic polyps 06/14/2010    Past Surgical History:  Procedure Laterality Date  . CARDIAC CATHETERIZATION  09/2010  .  CHOLECYSTECTOMY OPEN  1998  . COLECTOMY  1998   "partial"  . CORONARY ANGIOPLASTY    . CORONARY ARTERY BYPASS GRAFT  09/2010   Median sternotomy for coronary artery bypass grafting x3  (left internal mammary artery to distal left anterior  descending  coronary artery, saphenous vein graft to first diagonal branch,  saphenous vein graft to first  obtuse marginal branch, endoscopic  saphenous vein harvest from right thigh). SURGEON:  Valentina Gu.  Roxy Manns, MD  ASSISTANT:  John Giovanni, PA-C  ANESTHESIA:  Glynda Jaeger, MD   . LEFT HEART CATH AND CORS/GRAFTS ANGIOGRAPHY N/A 08/22/2017   Procedure: LEFT HEART CATH AND CORS/GRAFTS ANGIOGRAPHY;  Surgeon: Martinique, Peter M, MD;  Location: [place] [facility];  Service: Cardiovascular;  Laterality: N/A;  . SKIN CANCER EXCISION     "top of my head"        Home Medications    Prior to Admission medications   Medication Sig Start Date End Date Taking? Authorizing Provider  aspirin 81 MG tablet Take 81 mg by mouth daily.   Yes [provider]  atorvastatin (LIPITOR) 20 MG tablet TAKE 1 TABLET (20 MG TOTAL) BY MOUTH DAILY. 08/05/17  Yes Marletta Lor, MD  cyanocobalamin (,VITAMIN B-12,) 1000 MCG/ML injection INJECT 1ML INTRAMUSCULARLY EVERY WEEK FOR TWO WEEKS THEN 1ML ONCE A MONTH THEREAFTER Patient taking  differently: INJECT 1ML INTRAMUSCULARLY ONCE A MONTH 12/10/17  Yes Marletta Lor, MD  diphenhydrAMINE (BENADRYL) 25 MG tablet Take 25 mg by mouth once as needed for allergies (bee sting).   Yes [provider]  ketoconazole (NIZORAL) 2 % shampoo APPLY EVERY OTHER DAY TO SCALP--LATHER 5 MINUTES,THEN RINSE OUT 10/15/17  Yes Marletta Lor, MD  metFORMIN (GLUCOPHAGE-XR) 500 MG 24 hr tablet Take 2 tablets (1,000 mg total) by mouth daily with breakfast. Resume on 08/25/17. Patient taking differently: Take 500 mg by mouth daily with breakfast.  08/22/17  Yes Duke, Tami Lin, PA  metoprolol tartrate (LOPRESSOR) 25 MG tablet  TAKE 1 TABLET (25 MG TOTAL) BY MOUTH 2 (TWO) TIMES DAILY. 08/05/17  Yes Marletta Lor, MD  nitroGLYCERIN (NITROSTAT) 0.4 MG SL tablet Place 1 tablet (0.4 mg total) under the tongue every 5 (five) minutes as needed for chest pain (3 doses max). 01/31/16  Yes Josue Hector, MD  EPINEPHrine (EPIPEN 2-PAK) 0.3 mg/0.3 mL IJ SOAJ injection Inject 0.3 mLs (0.3 mg total) into the muscle once for 1 dose. 03/03/18 03/03/18  Davonna Belling, MD  predniSONE (DELTASONE) 20 MG tablet Take 2 tablets (40 mg total) by mouth daily. 03/03/18   Davonna Belling, MD    Family History Family History  Problem Relation Age of Onset  . Lung cancer Sister   . Heart attack Father     Social History Social History   Tobacco Use  . Smoking status: Former Smoker    Packs/day: 1.50    Years: 3.00    Pack years: 4.50    Types: Cigarettes    Last attempt to quit: 12/09/1951    Years since quitting: 66.2  . Smokeless tobacco: Never Used  Substance Use Topics  . Alcohol use: Yes    Comment: "no alcohol since 1953"  . Drug use: No     Allergies   Levaquin [levofloxacin in d5w] and Other   Review of Systems Review of Systems  Unable to perform ROS: Mental status change  Respiratory: Negative for chest tightness and shortness of breath.      Physical Exam Updated Vital Signs BP 129/65 (BP Location: Right Arm)   Pulse 80   Temp (!) 97.4 F (36.3 C) (Oral)   Resp 17   SpO2 97%   Physical Exam  Constitutional: He appears well-developed.  HENT:  Head: Atraumatic.  Eyes: EOM are normal.  Neck: Neck supple.  Cardiovascular: Normal rate.  Pulmonary/Chest: He has no wheezes.  Abdominal: There is no tenderness.  Musculoskeletal: He exhibits no edema.  Neurological: He is alert.  Has had some vomiting.  Somewhat difficult to get history from.  Skin: Skin is warm. Capillary refill takes less than 2 seconds.     ED Treatments / Results  Labs (all labs ordered are listed, but only abnormal  results are displayed) Labs Reviewed  CBC WITH DIFFERENTIAL/PLATELET - Abnormal; Notable for the following components:      Result Value   RBC 3.74 (*)    Hemoglobin 12.0 (*)    HCT 37.2 (*)    Lymphs Abs 5.9 (*)    All other components within normal limits  I-STAT CHEM 8, ED - Abnormal; Notable for the following components:   Potassium 3.0 (*)    BUN 30 (*)    Glucose, Bld 169 (*)    Hemoglobin 11.9 (*)    HCT 35.0 (*)    All other components within normal limits  CBG MONITORING, ED -  Abnormal; Notable for the following components:   Glucose-Capillary 191 (*)    All other components within normal limits  I-STAT TROPONIN, ED    EKG EKG Interpretation  Date/Time:  Tuesday March 03 2018 15:07:04 EDT Ventricular Rate:  81 PR Interval:    QRS Duration: 128 QT Interval:  390 QTC Calculation: 453 R Axis:   -49 Text Interpretation:  Sinus rhythm Short PR interval Right atrial enlargement Confirmed by Davonna Belling [phone]) on 03/03/2018 3:11:15 PM   Radiology Ct Head Wo Contrast  Result Date: 03/03/2018 CLINICAL DATA:  Altered level of consciousness, and flatter reaction to bee sting EXAM: CT HEAD WITHOUT CONTRAST TECHNIQUE: Contiguous axial images were obtained from the base of the skull through the vertex without intravenous contrast. COMPARISON:  CT brain scan of 05/24/2015 FINDINGS: Brain: The ventricular system remains prominent as are the cortical sulci diffusely consistent with diffuse atrophy. The septum remains midline in position. Mild moderate small vessel ischemic change remains throughout the periventricular white matter. Benign-appearing bilateral basal ganglial calcifications are noted. No hemorrhage, mass lesion, or acute infarction is seen. Vascular: No vascular abnormality is noted on this unenhanced study. Calcification of the vertebral arteries is present. Skull: On bone window images, no calvarial abnormality is seen. Sinuses/Orbits: The paranasal sinuses appear  well pneumatized. Small retention cyst is present in the lateral aspect of the left maxillary sinus. Other: None. IMPRESSION: 1. Stable atrophy and small vessel ischemic change. 2. No acute intracranial abnormality. Electronically Signed   By: Ivar Drape M.D.   On: 03/03/2018 17:26   Dg Chest Portable 1 View  Result Date: 03/03/2018 CLINICAL DATA:  Shortness of breath EXAM: PORTABLE CHEST 1 VIEW COMPARISON:  08/21/2017 FINDINGS: The heart size and mediastinal contours are within normal limits. Right lung base opacity, favored to be atelectasis. The visualized skeletal structures are unremarkable. Unchanged median sternotomy wires. IMPRESSION: Right basilar atelectasis. Electronically Signed   By: Ulyses Jarred M.D.   On: 03/03/2018 15:40    Procedures Procedures (including critical care time)  Medications Ordered in ED Medications  methylPREDNISolone sodium succinate (SOLU-MEDROL) 125 mg/2 mL injection 125 mg (125 mg Intravenous Given 03/03/18 1523)  famotidine (PEPCID) IVPB 20 mg premix (0 mg Intravenous Stopped 03/03/18 1553)  diphenhydrAMINE (BENADRYL) injection 25 mg (25 mg Intravenous Given 03/03/18 1523)  ondansetron (ZOFRAN) injection 4 mg (4 mg Intravenous Given 03/03/18 1523)  LORazepam (ATIVAN) injection 0.25 mg (0.25 mg Intravenous Given 03/03/18 1640)     Initial Impression / Assessment and Plan / ED Course  I have reviewed the triage vital signs and the nursing notes.  Pertinent labs & imaging results that were available during my care of the patient were reviewed by me and considered in my medical decision making (see chart for details).     Patient with anaphylactic reaction to bee sting.  History of same.  EMS placed [facility] airway but the patient vomited.  While here patient had episode where he had some shaking in both his upper extremities.  Was conscious with it but decreased mental status.  Did come about an hour after getting IV Benadryl.  Patient later returned to his  baseline.  Reassuring blood pressures.  Remembers all events including when he was less responsive.  Discharge home with steroids.  Refilled EpiPen. Final Clinical Impressions(s) / ED Diagnoses   Final diagnoses:  Anaphylactic shock due to insect sting, accidental or unintentional, initial encounter    ED Discharge Orders        Ordered  predniSONE (DELTASONE) 20 MG tablet  Daily     03/03/18 1918    EPINEPHrine (EPIPEN 2-PAK) 0.3 mg/0.3 mL IJ SOAJ injection   Once     03/03/18 1918       Davonna Belling, MD 03/03/18 2328

## 2018-03-03 NOTE — ED Notes (Signed)
Patient moaning and complaining of pain to back. Family at bedside, states patient's spouse had to pull patient into floor from a couch during episode.

## 2018-03-03 NOTE — ED Triage Notes (Signed)
Pt was stung by a bee. When EMS arrived they administered his Epi Pen. Agonal breathing on arrival. Pt was bagged en route to hospital.King airway inserted, then DC'd due to pt vomiting continuously. Emesis in bag. Hooked up to pacing pads, but pt did not code. Pt now alert and oriented on arrival.

## 2018-03-03 NOTE — Discharge Instructions (Addendum)
Watch for signs of infection since there may have been some aspiration.  Watch for return of allergy symptoms.

## 2018-03-03 NOTE — ED Notes (Signed)
Gave patient ice chips as requested and approved by physician.

## 2018-03-07 ENCOUNTER — Other Ambulatory Visit: Payer: Self-pay | Admitting: Internal Medicine

## 2018-03-07 DIAGNOSIS — R110 Nausea: Secondary | ICD-10-CM | POA: Diagnosis not present

## 2018-03-07 DIAGNOSIS — R42 Dizziness and giddiness: Secondary | ICD-10-CM | POA: Diagnosis not present

## 2018-03-19 ENCOUNTER — Ambulatory Visit (INDEPENDENT_AMBULATORY_CARE_PROVIDER_SITE_OTHER): Payer: Medicare Other | Admitting: Internal Medicine

## 2018-03-19 ENCOUNTER — Encounter: Payer: Self-pay | Admitting: Internal Medicine

## 2018-03-19 VITALS — BP 124/62 | HR 59 | Temp 97.9°F | Ht 71.0 in | Wt 185.6 lb

## 2018-03-19 DIAGNOSIS — R109 Unspecified abdominal pain: Secondary | ICD-10-CM | POA: Diagnosis not present

## 2018-03-19 DIAGNOSIS — E538 Deficiency of other specified B group vitamins: Secondary | ICD-10-CM

## 2018-03-19 DIAGNOSIS — E782 Mixed hyperlipidemia: Secondary | ICD-10-CM

## 2018-03-19 DIAGNOSIS — E1149 Type 2 diabetes mellitus with other diabetic neurological complication: Secondary | ICD-10-CM | POA: Diagnosis not present

## 2018-03-19 DIAGNOSIS — I2510 Atherosclerotic heart disease of native coronary artery without angina pectoris: Secondary | ICD-10-CM

## 2018-03-19 LAB — MICROALBUMIN / CREATININE URINE RATIO
Creatinine,U: 81.3 mg/dL
Microalb Creat Ratio: 0.9 mg/g (ref 0.0–30.0)
Microalb, Ur: <0.7 mg/dL (ref 0.0–1.9)

## 2018-03-19 LAB — CBC WITH DIFFERENTIAL/PLATELET
BASOS PCT: 1 % (ref 0.0–3.0)
Basophils Absolute: 0 10*3/uL (ref 0.0–0.1)
EOS PCT: 3.7 % (ref 0.0–5.0)
Eosinophils Absolute: 0.2 10*3/uL (ref 0.0–0.7)
HCT: 38.4 % — ABNORMAL LOW (ref 39.0–52.0)
HEMOGLOBIN: 13.1 g/dL (ref 13.0–17.0)
Lymphocytes Relative: 32.9 % (ref 12.0–46.0)
Lymphs Abs: 1.6 10*3/uL (ref 0.7–4.0)
MCHC: 34.2 g/dL (ref 30.0–36.0)
MCV: 97.2 fl (ref 78.0–100.0)
Monocytes Absolute: 0.4 10*3/uL (ref 0.1–1.0)
Monocytes Relative: 8.6 % (ref 3.0–12.0)
NEUTROS ABS: 2.6 10*3/uL (ref 1.4–7.7)
Neutrophils Relative %: 53.8 % (ref 43.0–77.0)
PLATELETS: 147 10*3/uL — AB (ref 150.0–400.0)
RBC: 3.95 Mil/uL — ABNORMAL LOW (ref 4.22–5.81)
RDW: 13.7 % (ref 11.5–15.5)
WBC: 4.8 10*3/uL (ref 4.0–10.5)

## 2018-03-19 LAB — COMPREHENSIVE METABOLIC PANEL
ALT: 15 U/L (ref 0–53)
AST: 16 U/L (ref 0–37)
Albumin: 4.2 g/dL (ref 3.5–5.2)
Alkaline Phosphatase: 61 U/L (ref 39–117)
BILIRUBIN TOTAL: 0.8 mg/dL (ref 0.2–1.2)
BUN: 24 mg/dL — ABNORMAL HIGH (ref 6–23)
CHLORIDE: 104 meq/L (ref 96–112)
CO2: 30 meq/L (ref 19–32)
Calcium: 9.3 mg/dL (ref 8.4–10.5)
Creatinine, Ser: 0.79 mg/dL (ref 0.40–1.50)
GFR: 99.38 mL/min (ref >60.00)
GLUCOSE: 127 mg/dL — AB (ref 70–99)
Potassium: 4.4 mEq/L (ref 3.5–5.1)
Sodium: 141 mEq/L (ref 135–145)
Total Protein: 6.4 g/dL (ref 6.0–8.3)

## 2018-03-19 LAB — LIPID PANEL
CHOL/HDL RATIO: 2
Cholesterol: 128 mg/dL (ref 0–200)
HDL: 59.7 mg/dL (ref >39.00)
LDL CALC: 56 mg/dL (ref 0–99)
NONHDL: 67.97
Triglycerides: 59 mg/dL (ref 0.0–149.0)
VLDL: 11.8 mg/dL (ref 0.0–40.0)

## 2018-03-19 LAB — VITAMIN B12: Vitamin B-12: 337 pg/mL (ref 211–911)

## 2018-03-19 LAB — TSH: TSH: 3.17 u[IU]/mL (ref 0.35–4.50)

## 2018-03-19 LAB — HEMOGLOBIN A1C: Hgb A1c MFr Bld: 7 % — ABNORMAL HIGH (ref 4.6–6.5)

## 2018-03-19 NOTE — Patient Instructions (Signed)
Limit your sodium (Salt) intake    It is important that you exercise regularly, at least 20 minutes 3 to 4 times per week.  If you develop chest pain or shortness of breath seek  medical attention.   Please check your hemoglobin A1c every 3-6  Months   neurology follow-up as scheduled  Cardiology follow-up as scheduled  We will schedule physical therapy

## 2018-03-19 NOTE — Progress Notes (Signed)
Subjective:    Patient ID: Michael Johnston, male    DOB: Mar 24, 1932, 84 y.o.   MRN: [id number]  HPI 84 year old patient who is seen today for annual evaluation as well as a subsequent Medicare wellness visit He is followed by urology and cardiology.  He is scheduled for urology follow-up tomorrow He has coronary artery disease status post CABG. He has type 2 diabetes [place] by peripheral neuropathy.  He has bilateral foot drop left greater than the right.  He was seen in the ED last month with anaphylactic shock secondary to a bee sting. He has B12 deficiency and is receiving monthly B12 injections.  He remains on simvastatin. Doing well today for Medicare wellness visit  Past Medical History:  Diagnosis Date  . Arthritis    "right shoulder" (05/24/2015)  . CHEST PAIN   . Chronic bronchitis ([place])    "get it ~ q yr" (05/24/2015)  . COLONIC POLYPS, HX OF   . COPD (chronic obstructive pulmonary disease) ([place])    "dx'd but I don't take RX for it" (05/24/2015)  . Coronary artery disease   . Cystic kidney disease   . GERD (gastroesophageal reflux disease)   . History of hiatal hernia   . MI (myocardial infarction) ([place]) 09/2010  . Mixed hyperlipidemia   . Skin cancer    "top of my head only" (05/24/2015)  . Sleep apnea   . Syncope and collapse ~ 2013; 05/24/2015     Social History   Socioeconomic History  . Marital status: Married    Spouse name: Not on file  . Number of children: Not on file  . Years of education: Not on file  . Highest education level: Not on file  Occupational History  . Not on file  Social Needs  . Financial resource strain: Not on file  . Food insecurity:    Worry: Not on file    Inability: Not on file  . Transportation needs:    Medical: Not on file    Non-medical: Not on file  Tobacco Use  . Smoking status: Former Smoker    Packs/day: 1.50    Years: 3.00    Pack years: 4.50    Types: Cigarettes    Last attempt to quit: 12/09/1951    Years  since quitting: 66.3  . Smokeless tobacco: Never Used  Substance and Sexual Activity  . Alcohol use: Yes    Comment: "no alcohol since 1953"  . Drug use: No  . Sexual activity: Not on file  Lifestyle  . Physical activity:    Days per week: Not on file    Minutes per session: Not on file  . Stress: Not on file  Relationships  . Social connections:    Talks on phone: Not on file    Gets together: Not on file    Attends religious service: Not on file    Active member of club or organization: Not on file    Attends meetings of clubs or organizations: Not on file    Relationship status: Not on file  . Intimate partner violence:    Fear of current or ex partner: Not on file    Emotionally abused: Not on file    Physically abused: Not on file    Forced sexual activity: Not on file  Other Topics Concern  . Not on file  Social History Narrative  . Not on file    Past Surgical History:  Procedure Laterality Date  . CARDIAC  CATHETERIZATION  09/2010  . CHOLECYSTECTOMY OPEN  1998  . COLECTOMY  1998   "partial"  . CORONARY ANGIOPLASTY    . CORONARY ARTERY BYPASS GRAFT  09/2010   Median sternotomy for coronary artery bypass grafting x3  (left internal mammary artery to distal left anterior  descending  coronary artery, saphenous vein graft to first diagonal branch,  saphenous vein graft to first  obtuse marginal branch, endoscopic  saphenous vein harvest from right thigh). SURGEON:  Valentina Gu.  Roxy Manns, MD  ASSISTANT:  John Giovanni, PA-C  ANESTHESIA:  Glynda Jaeger, MD   . LEFT HEART CATH AND CORS/GRAFTS ANGIOGRAPHY N/A 08/22/2017   Procedure: LEFT HEART CATH AND CORS/GRAFTS ANGIOGRAPHY;  Surgeon: Martinique, Lars Jeziorski M, MD;  Location: [facility];  Service: Cardiovascular;  Laterality: N/A;  . SKIN CANCER EXCISION     "top of my head"    Family History  Problem Relation Age of Onset  . Lung cancer Sister   . Heart attack Father     Allergies  Allergen Reactions  . Levaquin  [Levofloxacin In D5w] Swelling and Other (See Comments)    Eyes swollen  . Other Anaphylaxis and Other (See Comments)    Bee sting    Current Outpatient Medications on File Prior to Visit  Medication Sig Dispense Refill  . aspirin 81 MG tablet Take 81 mg by mouth daily.    Marland Kitchen atorvastatin (LIPITOR) 20 MG tablet TAKE 1 TABLET (20 MG TOTAL) BY MOUTH DAILY. 90 tablet 1  . cyanocobalamin (,VITAMIN B-12,) 1000 MCG/ML injection INJECT 1ML INTRAMUSCULARLY EVERY WEEK FOR TWO WEEKS THEN 1ML ONCE A MONTH THEREAFTER (Patient taking differently: INJECT 1ML INTRAMUSCULARLY ONCE A MONTH) 13 mL 0  . diphenhydrAMINE (BENADRYL) 25 MG tablet Take 25 mg by mouth once as needed for allergies (bee sting).    . EPINEPHrine (EPIPEN 2-PAK) 0.3 mg/0.3 mL IJ SOAJ injection     . ketoconazole (NIZORAL) 2 % shampoo APPLY EVERY OTHER DAY TO SCALP--LATHER 5 MINUTES,THEN RINSE OUT 120 mL 3  . metFORMIN (GLUCOPHAGE-XR) 500 MG 24 hr tablet Take 2 tablets (1,000 mg total) by mouth daily with breakfast. Resume on 08/25/17. (Patient taking differently: Take 500 mg by mouth daily with breakfast. ) 180 tablet 2  . metoprolol tartrate (LOPRESSOR) 25 MG tablet TAKE 1 TABLET (25 MG TOTAL) BY MOUTH 2 (TWO) TIMES DAILY. 180 tablet 3  . nitroGLYCERIN (NITROSTAT) 0.4 MG SL tablet Place 1 tablet (0.4 mg total) under the tongue every 5 (five) minutes as needed for chest pain (3 doses max). 25 tablet 1  . predniSONE (DELTASONE) 20 MG tablet Take 2 tablets (40 mg total) by mouth daily. 2 tablet 0   No current facility-administered medications on file prior to visit.     BP 124/62 (BP Location: Right Arm, Patient Position: Sitting, Cuff Size: Large)   Pulse (!) 59   Temp 97.9 F (36.6 C) (Oral)   Ht 5\' 11"  (1.803 m)   Wt 185 lb 9.6 oz (84.2 kg)   SpO2 96%   BMI 25.89 kg/m   Subsequent Medicare wellness visit  1. Risk factors, based on past  M,S,F history.  Patient has known coronary artery disease.  He has dyslipidemia and type 2  diabetes  2.  Physical activities: Remains fairly active and enjoys fishing and boating.  Limited due to a peripheral neuropathy with foot drop  3.  Depression/mood: No history major depression or mood disorder  4.  Hearing: No major deficits  5.  ADL's: Independent  6.  Fall risk: Moderate due to peripheral neuropathy with foot drop  7.  Home safety: No problems identified  8.  Height weight, and visual acuity; height and weight stable no change in visual acuity  9.  Counseling: Continue heart healthy diet  10. Lab orders based on risk factors: We will check laboratory update including hemoglobin A1c lipid profile and urine for microalbumin  11. Referral : Follow-up urology and cardiology.  We will set up for physical therapy due to his foot drop  12. Care plan: Continue efforts at aggressive risk factor modification  13. Cognitive assessment: Alert and oriented with normal affect.  No cognitive dysfunction  14. Screening: Patient provided with a written and personalized 5-10 year screening schedule in the AVS.    15. Provider List Update: Primary care urology cardiology and physical therapy   Review of Systems  Constitutional: Negative for appetite change, chills, fatigue and fever.  HENT: Negative for congestion, dental problem, ear pain, hearing loss, sore throat, tinnitus, trouble swallowing and voice change.   Eyes: Negative for pain, discharge and visual disturbance.  Respiratory: Negative for cough, chest tightness, wheezing and stridor.   Cardiovascular: Negative for chest pain, palpitations and leg swelling.  Gastrointestinal: Negative for abdominal distention, abdominal pain, blood in stool, constipation, diarrhea, nausea and vomiting.  Genitourinary: Negative for difficulty urinating, discharge, flank pain, genital sores, hematuria and urgency.  Musculoskeletal: Positive for back pain and gait problem. Negative for arthralgias, joint swelling, myalgias and neck  stiffness.  Skin: Negative for rash.  Neurological: Negative for dizziness, syncope, speech difficulty, weakness, numbness and headaches.  Hematological: Negative for adenopathy. Does not bruise/bleed easily.  Psychiatric/Behavioral: Negative for behavioral problems and dysphoric mood. The patient is not nervous/anxious.        Objective:   Physical Exam  Constitutional: He is oriented to person, place, and time. He appears well-developed.  HENT:  Head: Normocephalic.  Right Ear: External ear normal.  Left Ear: External ear normal.  Eyes: Conjunctivae and EOM are normal.  Neck: Normal range of motion.  Cardiovascular: Normal rate, normal heart sounds and intact distal pulses.  Diminished left posterior tibial pulse  Pulmonary/Chest: Breath sounds normal.  Abdominal: Bowel sounds are normal.  Musculoskeletal: Normal range of motion. He exhibits no edema or tenderness.  Neurological: He is alert and oriented to person, place, and time. He displays abnormal reflex.  Bilateral foot drop left more prominent than the right  Psychiatric: He has a normal mood and affect. His behavior is normal.          Assessment & Plan:   Preventive health examination Subsequent Medicare wellness visit Type 2 diabetes.  Will review a hemoglobin A1c Coronary artery disease.  Review lipid profile Peripheral neuropathy with bilateral foot drop.  We will set up for physical therapy consider ankle brace Dyslipidemia continue statin therapy  Follow-up 6 months with new provider  Marletta Lor

## 2018-03-20 DIAGNOSIS — R39198 Other difficulties with micturition: Secondary | ICD-10-CM | POA: Diagnosis not present

## 2018-03-20 DIAGNOSIS — N138 Other obstructive and reflux uropathy: Secondary | ICD-10-CM | POA: Diagnosis not present

## 2018-03-20 DIAGNOSIS — N281 Cyst of kidney, acquired: Secondary | ICD-10-CM | POA: Diagnosis not present

## 2018-03-20 DIAGNOSIS — R109 Unspecified abdominal pain: Secondary | ICD-10-CM | POA: Diagnosis not present

## 2018-03-20 DIAGNOSIS — N401 Enlarged prostate with lower urinary tract symptoms: Secondary | ICD-10-CM | POA: Diagnosis not present

## 2018-03-31 ENCOUNTER — Other Ambulatory Visit: Payer: Self-pay

## 2018-03-31 ENCOUNTER — Ambulatory Visit: Payer: Medicare Other | Attending: Internal Medicine | Admitting: Physical Therapy

## 2018-03-31 ENCOUNTER — Encounter: Payer: Self-pay | Admitting: Physical Therapy

## 2018-03-31 VITALS — HR 60

## 2018-03-31 DIAGNOSIS — M6281 Muscle weakness (generalized): Secondary | ICD-10-CM | POA: Diagnosis not present

## 2018-03-31 DIAGNOSIS — R2681 Unsteadiness on feet: Secondary | ICD-10-CM | POA: Insufficient documentation

## 2018-03-31 NOTE — Therapy (Addendum)
[facility] [place], [place], [address] Phone: [phone]   Fax:  [phone]  Physical Therapy Evaluation  Patient Details  Name: Michael Johnston MRN: [id number] Date of Birth: 04/16/1932 Referring Provider: Bluford Kaufmann MD.   Encounter Date: 03/31/2018  PT End of Session - 03/31/18 1009    Visit Number  1    Number of Visits  12    Date for PT Re-Evaluation  06/29/18    PT Start Time  0945    PT Stop Time  1013    PT Time Calculation (min)  28 min    Activity Tolerance  Patient tolerated treatment well    Behavior During Therapy  [facility] for tasks assessed/performed       Past Medical History:  Diagnosis Date  . Arthritis    "right shoulder" (05/24/2015)  . CHEST PAIN   . Chronic bronchitis ([place])    "get it ~ q yr" (05/24/2015)  . COLONIC POLYPS, HX OF   . COPD (chronic obstructive pulmonary disease) ([place])    "dx'd but I don't take RX for it" (05/24/2015)  . Coronary artery disease   . Cystic kidney disease   . GERD (gastroesophageal reflux disease)   . History of hiatal hernia   . MI (myocardial infarction) ([place]) 09/2010  . Mixed hyperlipidemia   . Skin cancer    "top of my head only" (05/24/2015)  . Sleep apnea   . Syncope and collapse ~ 2013; 05/24/2015    Past Surgical History:  Procedure Laterality Date  . CARDIAC CATHETERIZATION  09/2010  . CHOLECYSTECTOMY OPEN  1998  . COLECTOMY  1998   "partial"  . CORONARY ANGIOPLASTY    . CORONARY ARTERY BYPASS GRAFT  09/2010   Median sternotomy for coronary artery bypass grafting x3  (left internal mammary artery to distal left anterior  descending  coronary artery, saphenous vein graft to first diagonal branch,  saphenous vein graft to first  obtuse marginal branch, endoscopic  saphenous vein harvest from right thigh). SURGEON:  Valentina Gu.  Roxy Manns, MD  ASSISTANT:  John Giovanni, PA-C  ANESTHESIA:  Glynda Jaeger, MD   . LEFT HEART CATH AND CORS/GRAFTS  ANGIOGRAPHY N/A 08/22/2017   Procedure: LEFT HEART CATH AND CORS/GRAFTS ANGIOGRAPHY;  Surgeon: Martinique, Peter M, MD;  Location: [facility];  Service: Cardiovascular;  Laterality: N/A;  . SKIN CANCER EXCISION     "top of my head"    Vitals:   03/31/18 1016  Pulse: 60  SpO2: 98%     Subjective Assessment - 03/31/18 1016    Subjective  The patient presents to PT with c/o unsteadiness on his feet.  He also reports some instances of hitting his toes when walking.  He reports he had a NCV test and it shows loss of nerve function.  His goal is to walk better.    Pertinent History  DM; MI; h/o LBP; arthritis; COPD.    How long can you walk comfortably?  A community distance.    Diagnostic tests  NCV.    Patient Stated Goals  Walk better.         [facility] PT Assessment - 03/31/18 0001      Assessment   Medical Diagnosis  Bilateral foot drop.    Referring Provider  Bluford Kaufmann MD.    Onset Date/Surgical Date  -- Ongoing.      Precautions   Precautions  Fall      Restrictions  Weight Bearing Restrictions  No      Balance Screen   Has the patient fallen in the past 6 months  No    Has the patient had a decrease in activity level because of a fear of falling?   No    Is the patient reluctant to leave their home because of a fear of falling?   No      Home Film/video editor residence      Prior Function   Level of Independence  Independent      Cognition   Overall Cognitive Status  Within Functional Limits for tasks assessed      Posture/Postural Control   Posture/Postural Control  Postural limitations    Postural Limitations  Rounded Shoulders;Forward head;Decreased lumbar lordosis;Flexed trunk    Posture Comments  -- Some lateral trunk flexion as well.      ROM / Strength   AROM / PROM / Strength  AROM;Strength      AROM   Overall AROM Comments  WFL for bilateral LE's.      Strength   Overall Strength Comments  Bilateral hip flexion  and abduction= 4+/5; bilateral knee extension= 5/5; right knee flexion= 4 to 4+/5; left knee flexion= 5/5; right ankle dorsiflexion= 4/5; left ankle dorsiflexion= 4/5 and he has no left great toe extension.      Special Tests    Special Tests  -- Unable to elicit bilateral LE DTR's.    Other special tests  "Wobbly" with performance of Romberg test but he did not lose his balance.      Bed Mobility   Bed Mobility  -- Independent with sit to supine and supine to sit.      Transfers   Five time sit to stand comments   -- Independent.      Ambulation/Gait   Gait Pattern  Scissoring;Ataxic      Standardized Balance Assessment   Standardized Balance Assessment  Berg Balance Test      Berg Balance Test   Sit to Stand  Able to stand  independently using hands    Standing Unsupported  Able to stand 2 minutes with supervision    Sitting with Back Unsupported but Feet Supported on Floor or Stool  Able to sit safely and securely 2 minutes    Stand to Sit  Sits safely with minimal use of hands    Transfers  Able to transfer safely, minor use of hands    Standing Unsupported with Eyes Closed  Able to stand 10 seconds with supervision    Standing Ubsupported with Feet Together  Able to place feet together independently but unable to hold for 30 seconds    From Standing, Reach Forward with Outstretched Arm  Can reach confidently >25 cm (10")    From Standing Position, Pick up Object from Floor  Able to pick up shoe, needs supervision    From Standing Position, Turn to Look Behind Over each Shoulder  Looks behind from both sides and weight shifts well    Turn 360 Degrees  Able to turn 360 degrees safely one side only in 4 seconds or less    Standing Unsupported, Alternately Place Feet on Step/Stool  Able to complete >2 steps/needs minimal assist    Standing Unsupported, One Foot in [employer] balance while stepping or standing    Standing on One Leg  Tries to lift leg/unable to hold 3 seconds but  remains standing independently  Total Score  39                Objective measurements completed on examination: See above findings.                PT Short Term Goals - 03/31/18 1042      PT SHORT TERM GOAL #1   Title  Independent with an initial HEP.    Time  2    Period  Weeks    Status  New      PT SHORT TERM GOAL #2   Title  Improve Berg score to 42/56.    Time  2    Period  Weeks    Status  New        PT Long Term Goals - 03/31/18 1107      PT LONG TERM GOAL #1   Title  Independent with an advanced HEP.    Time  8    Period  Weeks    Status  New      PT LONG TERM GOAL #2   Title  Improve bilateral LE strength to 5/5.    Time  8    Period  Weeks    Status  New      PT LONG TERM GOAL #3   Title  Improve Berg score to 48/56.    Time  8    Period  Weeks    Status  New      PT LONG TERM GOAL #4   Title  Walk without ataxia or scissoring.    Time  8    Period  Weeks    Status  New             Plan - 03/31/18 1027    Clinical Impression Statement  The patient presents to OPPT with bilateral LE weakness and gait unsteadiness.  He had a tendencey to scissor gait on occasions with a "staggering" type gait pattern.  He also has had times when he hits his toes when walking. He scored a 39/56 on the Berg balance test.  He has a lose of dorsiflexion strength and no active left great toe extension.  Patient expected to do well well with skilled physical therapy intervention with progression through a LE strengthening and balance program.    History and Personal Factors relevant to plan of care:  DM; MI; h/o LBP; arthritis; COPD.    Clinical Presentation  Evolving    Clinical Presentation due to:  Balance worsening.    Clinical Decision Making  Moderate    Rehab Potential  Good    PT Frequency  2x / week    PT Duration  6 weeks    PT Treatment/Interventions  ADLs/Self Care Home Management;Gait training;Therapeutic activities;Therapeutic  exercise;Balance training;Neuromuscular re-education;Patient/family education    PT Next Visit Plan  LE strength to include ankle isolator for ankle strength; balance activites to include dynadisc and BOSU ball.    Consulted and Agree with Plan of Care  Patient       Patient will benefit from skilled therapeutic intervention in order to improve the following deficits and impairments:  Abnormal gait, Decreased balance, Postural dysfunction, Decreased coordination, Difficulty walking  Visit Diagnosis: Unsteadiness on feet - Plan: PT plan of care cert/re-cert  Muscle weakness (generalized) - Plan: PT plan of care cert/re-cert     Problem List Patient Active Problem List   Diagnosis Date Noted  . Urine stream spraying 02/23/2018  . Flank pain 02/22/2018  .  Chest pain 08/21/2017  . Unstable angina ([place]) 08/21/2017  . Renal cyst 12/20/2016  . B12 deficiency 11/08/2016  . Type 2 diabetes mellitus with neurological complications ([place]) 69/45/0388  . Syncope and collapse 05/24/2015  . Benign prostatic hyperplasia with urinary obstruction 12/12/2014  . Carotid artery stenosis 05/22/2012  . CAD (coronary artery disease) 05/14/2012  . ED (erectile dysfunction) of organic origin 08/19/2011  . FH: CABG (coronary artery bypass surgery) 01/28/2011  . Mixed hyperlipidemia 10/23/2010  . GERD (gastroesophageal reflux disease) 10/22/2010  . History of colonic polyps 06/14/2010    Xayla Puzio, [place] MPT 03/31/2018, 12:32 PM  [facility] [address] [place], [place], [address] Phone: [phone]   Fax:  [phone]  Name: Michael Johnston MRN: [id number] Date of Birth: 1932/04/10

## 2018-04-07 ENCOUNTER — Ambulatory Visit: Payer: Medicare Other | Admitting: Physical Therapy

## 2018-04-07 DIAGNOSIS — R2681 Unsteadiness on feet: Secondary | ICD-10-CM

## 2018-04-07 DIAGNOSIS — M6281 Muscle weakness (generalized): Secondary | ICD-10-CM

## 2018-04-07 NOTE — Therapy (Signed)
[facility] [place], [place], [address] Phone: [phone]   Fax:  [phone]  Physical Therapy Treatment  Patient Details  Name: Michael Johnston MRN: [id number] Date of Birth: 1932/08/12 Referring Provider: Bluford Kaufmann MD.   Encounter Date: 04/07/2018  PT End of Session - 04/07/18 1033    Visit Number  2    Number of Visits  12    Date for PT Re-Evaluation  06/29/18    PT Start Time  0945    PT Stop Time  1033    PT Time Calculation (min)  48 min    Equipment Utilized During Treatment  Gait belt    Activity Tolerance  Patient tolerated treatment well    Behavior During Therapy  [facility] for tasks assessed/performed       Past Medical History:  Diagnosis Date  . Arthritis    "right shoulder" (05/24/2015)  . CHEST PAIN   . Chronic bronchitis ([place])    "get it ~ q yr" (05/24/2015)  . COLONIC POLYPS, HX OF   . COPD (chronic obstructive pulmonary disease) ([place])    "dx'd but I don't take RX for it" (05/24/2015)  . Coronary artery disease   . Cystic kidney disease   . GERD (gastroesophageal reflux disease)   . History of hiatal hernia   . MI (myocardial infarction) ([place]) 09/2010  . Mixed hyperlipidemia   . Skin cancer    "top of my head only" (05/24/2015)  . Sleep apnea   . Syncope and collapse ~ 2013; 05/24/2015    Past Surgical History:  Procedure Laterality Date  . CARDIAC CATHETERIZATION  09/2010  . CHOLECYSTECTOMY OPEN  1998  . COLECTOMY  1998   "partial"  . CORONARY ANGIOPLASTY    . CORONARY ARTERY BYPASS GRAFT  09/2010   Median sternotomy for coronary artery bypass grafting x3  (left internal mammary artery to distal left anterior  descending  coronary artery, saphenous vein graft to first diagonal branch,  saphenous vein graft to first  obtuse marginal branch, endoscopic  saphenous vein harvest from right thigh). SURGEON:  Valentina Gu.  Roxy Manns, MD  ASSISTANT:  John Giovanni, PA-C  ANESTHESIA:  Glynda Jaeger, MD   . LEFT HEART CATH AND CORS/GRAFTS ANGIOGRAPHY N/A 08/22/2017   Procedure: LEFT HEART CATH AND CORS/GRAFTS ANGIOGRAPHY;  Surgeon: Martinique, Peter M, MD;  Location: [facility];  Service: Cardiovascular;  Laterality: N/A;  . SKIN CANCER EXCISION     "top of my head"    There were no vitals filed for this visit.  Subjective Assessment - 04/07/18 1045    Subjective  Patient reports no new complaints.     Pertinent History  DM; MI; h/o LBP; arthritis; COPD.    How long can you walk comfortably?  A community distance.    Diagnostic tests  NCV.    Patient Stated Goals  Walk better.    Currently in Pain?  No/denies         [facility] PT Assessment - 04/07/18 0001      Assessment   Medical Diagnosis  Bilateral foot drop.      Precautions   Precautions  Fall      Restrictions   Weight Bearing Restrictions  No                   [facility] Adult PT Treatment/Exercise - 04/07/18 0001      Exercises   Exercises  Ankle  Ankle Exercises: Aerobic   Nustep  Level 4 x10 minutes with emphasis of foot flat on plates to improve DF      Ankle Exercises: Standing   Rocker Board  2 minutes      Ankle Exercises: Seated   Heel Raises  10 reps;Both;20 reps    Toe Raise  10 reps;20 reps    Other Seated Ankle Exercises  Ankle DF/IF and circles x30 each bilaterally on dynadisc      Ankle Exercises: Supine   T-Band  red T-Band DF x30 each    Other Supine Ankle Exercises  ankle isolator #1 30x DF, IN, EV each, bilaterally    Other Supine Ankle Exercises  bridges 2x10  with emphasis on foot flat on table          Balance Exercises - 04/07/18 1038      Balance Exercises: Standing   Gait with Head Turns  Forward;5 reps    Sidestepping  5 reps;Upper extremity support    Marching Limitations  heel taps on 4" step 3x10 B UE support, L UE support then no UE support          PT Short Term Goals - 03/31/18 1042      PT SHORT TERM GOAL #1   Title  Independent  with an initial HEP.    Time  2    Period  Weeks    Status  New      PT SHORT TERM GOAL #2   Title  Improve Berg score to 42/56.    Time  2    Period  Weeks    Status  New        PT Long Term Goals - 03/31/18 1107      PT LONG TERM GOAL #1   Title  Independent with an advanced HEP.    Time  8    Period  Weeks    Status  New      PT LONG TERM GOAL #2   Title  Improve bilateral LE strength to 5/5.    Time  8    Period  Weeks    Status  New      PT LONG TERM GOAL #3   Title  Improve Berg score to 48/56.    Time  8    Period  Weeks    Status  New      PT LONG TERM GOAL #4   Title  Walk without ataxia or scissoring.    Time  8    Period  Weeks    Status  New            Plan - 04/07/18 1046    Clinical Impression Statement  Patient was able to tolerate treatment well with Min A to supervision for balance activities. Patient required multiple tactile cuing for proper form for all exercises. Tactile cuing and demonstration preferred as patient is hard of hearing. Patient required CG assist for gait with head turns. Patient noted with a decrease in gait speed and decreased step lengths when ambulating with head turns. Patient and PT disccused continuing walking program to improve walking and HEP will be provided next visit to improve specific muscles and optimize physical therapy. Patient reported agreement and understanding.     Clinical Presentation  Evolving    Clinical Decision Making  Moderate    Rehab Potential  Good    PT Frequency  2x / week    PT Duration  6  weeks    PT Treatment/Interventions  ADLs/Self Care Home Management;Gait training;Therapeutic activities;Therapeutic exercise;Balance training;Neuromuscular re-education;Patient/family education    PT Next Visit Plan  Create HEP for ankle strength and safe balance activities. LE strength to include ankle isolator for ankle strength; balance activites to include dynadisc and BOSU ball.    Consulted and Agree  with Plan of Care  Patient       Patient will benefit from skilled therapeutic intervention in order to improve the following deficits and impairments:  Abnormal gait, Decreased balance, Postural dysfunction, Decreased coordination, Difficulty walking  Visit Diagnosis: Unsteadiness on feet  Muscle weakness (generalized)     Problem List Patient Active Problem List   Diagnosis Date Noted  . Urine stream spraying 02/23/2018  . Flank pain 02/22/2018  . Chest pain 08/21/2017  . Unstable angina ([place]) 08/21/2017  . Renal cyst 12/20/2016  . B12 deficiency 11/08/2016  . Type 2 diabetes mellitus with neurological complications ([place]) 02/72/5366  . Syncope and collapse 05/24/2015  . Benign prostatic hyperplasia with urinary obstruction 12/12/2014  . Carotid artery stenosis 05/22/2012  . CAD (coronary artery disease) 05/14/2012  . ED (erectile dysfunction) of organic origin 08/19/2011  . FH: CABG (coronary artery bypass surgery) 01/28/2011  . Mixed hyperlipidemia 10/23/2010  . GERD (gastroesophageal reflux disease) 10/22/2010  . History of colonic polyps 06/14/2010   Gabriela Eves, PT, DPT 04/07/2018, 10:56 AM  [facility] [address] [place], [place], [address] Phone: [phone]   Fax:  [phone]  Name: Michael Johnston MRN: [id number] Date of Birth: February 16, 1932

## 2018-04-23 ENCOUNTER — Encounter: Payer: Self-pay | Admitting: Physical Therapy

## 2018-04-23 ENCOUNTER — Ambulatory Visit: Payer: Medicare Other | Attending: Internal Medicine | Admitting: Physical Therapy

## 2018-04-23 DIAGNOSIS — M6281 Muscle weakness (generalized): Secondary | ICD-10-CM

## 2018-04-23 DIAGNOSIS — R2681 Unsteadiness on feet: Secondary | ICD-10-CM | POA: Diagnosis not present

## 2018-04-23 NOTE — Therapy (Signed)
[facility] [place], [place], [address] Phone: [phone]   Fax:  [phone]  Physical Therapy Treatment  Patient Details  Name: Michael Johnston MRN: [id number] Date of Birth: 22-Sep-1931 Referring Provider: Bluford Kaufmann MD.   Encounter Date: 04/23/2018  PT End of Session - 04/23/18 0954    Visit Number  3    Number of Visits  12    Date for PT Re-Evaluation  06/29/18    PT Start Time  0945    PT Stop Time  1031    PT Time Calculation (min)  46 min    Activity Tolerance  Patient tolerated treatment well    Behavior During Therapy  [facility] for tasks assessed/performed       Past Medical History:  Diagnosis Date  . Arthritis    "right shoulder" (05/24/2015)  . CHEST PAIN   . Chronic bronchitis ([place])    "get it ~ q yr" (05/24/2015)  . COLONIC POLYPS, HX OF   . COPD (chronic obstructive pulmonary disease) ([place])    "dx'd but I don't take RX for it" (05/24/2015)  . Coronary artery disease   . Cystic kidney disease   . GERD (gastroesophageal reflux disease)   . History of hiatal hernia   . MI (myocardial infarction) ([place]) 09/2010  . Mixed hyperlipidemia   . Skin cancer    "top of my head only" (05/24/2015)  . Sleep apnea   . Syncope and collapse ~ 2013; 05/24/2015    Past Surgical History:  Procedure Laterality Date  . CARDIAC CATHETERIZATION  09/2010  . CHOLECYSTECTOMY OPEN  1998  . COLECTOMY  1998   "partial"  . CORONARY ANGIOPLASTY    . CORONARY ARTERY BYPASS GRAFT  09/2010   Median sternotomy for coronary artery bypass grafting x3  (left internal mammary artery to distal left anterior  descending  coronary artery, saphenous vein graft to first diagonal branch,  saphenous vein graft to first  obtuse marginal branch, endoscopic  saphenous vein harvest from right thigh). SURGEON:  Valentina Gu.  Roxy Manns, MD  ASSISTANT:  John Giovanni, PA-C  ANESTHESIA:  Glynda Jaeger, MD   . LEFT HEART CATH AND CORS/GRAFTS  ANGIOGRAPHY N/A 08/22/2017   Procedure: LEFT HEART CATH AND CORS/GRAFTS ANGIOGRAPHY;  Surgeon: Martinique, Peter M, MD;  Location: [facility];  Service: Cardiovascular;  Laterality: N/A;  . SKIN CANCER EXCISION     "top of my head"    There were no vitals filed for this visit.  Subjective Assessment - 04/23/18 0948    Subjective  Patient reported doing well, and not report any recent falls.     Pertinent History  DM; MI; h/o LBP; arthritis; COPD.    How long can you walk comfortably?  A community distance.    Diagnostic tests  NCV.    Patient Stated Goals  Walk better.    Currently in Pain?  No/denies         [facility] PT Assessment - 04/23/18 0001      Assessment   Medical Diagnosis  Bilateral foot drop.      Precautions   Precautions  Fall      Restrictions   Weight Bearing Restrictions  No                   [facility] Adult PT Treatment/Exercise - 04/23/18 0001      Exercises   Exercises  Ankle      Ankle Exercises:  Aerobic   Nustep  Level 4 x15 minutes with emphasis of foot flat on plates to improve DF      Ankle Exercises: Seated   Heel Raises  Both;10 reps;20 reps   x30   Toe Raise  10 reps   limited AROM     Ankle Exercises: Supine   T-Band  red T-Band DF and PF x30 each    Other Supine Ankle Exercises  ankle isolator #1 30x DF, IN, EV each, bilaterally    Other Supine Ankle Exercises  inversion squeeze 3x10          Balance Exercises - 04/23/18 1012      Balance Exercises: Standing   Rockerboard  Anterior/posterior;Other time (comment)   3 minutes   Sidestepping  1 rep   down carpeted hall way no UE support   Marching Limitations  marching x30 followed by heel taps on 4" step 3x10 B UE support, L UE support then no UE support    Sit to Stand Time  2x10    Other Standing Exercises  hip abduction x10 each 1 UE support          PT Short Term Goals - 03/31/18 1042      PT SHORT TERM GOAL #1   Title  Independent with an initial HEP.     Time  2    Period  Weeks    Status  New      PT SHORT TERM GOAL #2   Title  Improve Berg score to 42/56.    Time  2    Period  Weeks    Status  New        PT Long Term Goals - 03/31/18 1107      PT LONG TERM GOAL #1   Title  Independent with an advanced HEP.    Time  8    Period  Weeks    Status  New      PT LONG TERM GOAL #2   Title  Improve bilateral LE strength to 5/5.    Time  8    Period  Weeks    Status  New      PT LONG TERM GOAL #3   Title  Improve Berg score to 48/56.    Time  8    Period  Weeks    Status  New      PT LONG TERM GOAL #4   Title  Walk without ataxia or scissoring.    Time  8    Period  Weeks    Status  New            Plan - 04/23/18 1039    Clinical Impression Statement  Patient was able to tolerate treatment well and required Min A to supervision for balance and therapeutic exercises. Patient continues to require tactile cuing for proper form. Patient required Min A during side stepping as patient noted with increased posterior sway. Patient noted with good left DF strength in supine but unable to perform against gravity.     Clinical Presentation  Evolving    Clinical Decision Making  Moderate    Rehab Potential  Good    PT Frequency  2x / week    PT Duration  6 weeks    PT Treatment/Interventions  ADLs/Self Care Home Management;Gait training;Therapeutic activities;Therapeutic exercise;Balance training;Neuromuscular re-education;Patient/family education    PT Next Visit Plan  Create HEP for ankle strength and safe balance activities. LE strength to include  ankle isolator for ankle strength; balance activites to include dynadisc and BOSU ball.    Consulted and Agree with Plan of Care  Patient       Patient will benefit from skilled therapeutic intervention in order to improve the following deficits and impairments:  Abnormal gait, Decreased balance, Postural dysfunction, Decreased coordination, Difficulty walking  Visit  Diagnosis: Unsteadiness on feet  Muscle weakness (generalized)     Problem List Patient Active Problem List   Diagnosis Date Noted  . Urine stream spraying 02/23/2018  . Flank pain 02/22/2018  . Chest pain 08/21/2017  . Unstable angina ([place]) 08/21/2017  . Renal cyst 12/20/2016  . B12 deficiency 11/08/2016  . Type 2 diabetes mellitus with neurological complications ([place]) 09/81/1914  . Syncope and collapse 05/24/2015  . Benign prostatic hyperplasia with urinary obstruction 12/12/2014  . Carotid artery stenosis 05/22/2012  . CAD (coronary artery disease) 05/14/2012  . ED (erectile dysfunction) of organic origin 08/19/2011  . FH: CABG (coronary artery bypass surgery) 01/28/2011  . Mixed hyperlipidemia 10/23/2010  . GERD (gastroesophageal reflux disease) 10/22/2010  . History of colonic polyps 06/14/2010   Gabriela Eves, PT, DPT 04/23/2018, 10:54 AM  [facility] [address] [place], [place], [address] Phone: [phone]   Fax:  [phone]  Name: Michael Johnston MRN: [id number] Date of Birth: 04/05/1932

## 2018-05-07 ENCOUNTER — Encounter: Payer: Self-pay | Admitting: Adult Health

## 2018-05-07 ENCOUNTER — Ambulatory Visit (INDEPENDENT_AMBULATORY_CARE_PROVIDER_SITE_OTHER): Payer: Medicare Other | Admitting: Adult Health

## 2018-05-07 VITALS — BP 120/60 | Temp 97.9°F | Wt 187.0 lb

## 2018-05-07 DIAGNOSIS — E782 Mixed hyperlipidemia: Secondary | ICD-10-CM

## 2018-05-07 DIAGNOSIS — E1149 Type 2 diabetes mellitus with other diabetic neurological complication: Secondary | ICD-10-CM | POA: Diagnosis not present

## 2018-05-07 DIAGNOSIS — M21372 Foot drop, left foot: Secondary | ICD-10-CM

## 2018-05-07 DIAGNOSIS — Z7689 Persons encountering health services in other specified circumstances: Secondary | ICD-10-CM

## 2018-05-07 DIAGNOSIS — Q619 Cystic kidney disease, unspecified: Secondary | ICD-10-CM

## 2018-05-07 DIAGNOSIS — I2510 Atherosclerotic heart disease of native coronary artery without angina pectoris: Secondary | ICD-10-CM | POA: Diagnosis not present

## 2018-05-07 DIAGNOSIS — E538 Deficiency of other specified B group vitamins: Secondary | ICD-10-CM

## 2018-05-07 DIAGNOSIS — M21371 Foot drop, right foot: Secondary | ICD-10-CM

## 2018-05-07 NOTE — Progress Notes (Signed)
Patient presents to clinic today to establish care. He is a pleasant 84 year old male who  has a past medical history of Arthritis, CHEST PAIN, Chronic bronchitis ([place]), COLONIC POLYPS, HX OF, COPD (chronic obstructive pulmonary disease) ([place]), Coronary artery disease, Cystic kidney disease, GERD (gastroesophageal reflux disease), History of hiatal hernia, MI (myocardial infarction) ([place]) (09/2010), Mixed hyperlipidemia, Skin cancer, Sleep apnea, and Syncope and collapse (~ 2013; 05/24/2015).  He is a former patient of Dr. Raliegh Ip who is transferring care to me.   His last CPE was in July 2019   Acute Concerns: Establish Care  Chronic Issues: DM II-takes metformin extended release 500 mg daily. He reports that he has not taken this medication in the last two months, as he had some abdominal discomfort.  He denies any nausea, vomiting, diarrhea but "does have some abdominal pain".  This is since resolved. Lab Results  Component Value Date   HGBA1C 7.0 (H) 03/19/2018   CAD s/p CABG -is followed by cardiology.  Currently prescribed Lipitor 20 mg, aspirin 81 mg and metoprolol 25 mg twice daily  Hyperlipidemia -PrescribedLipitor 20 mg and aspirin 81 mg daily Lab Results  Component Value Date   CHOL 128 03/19/2018   HDL 59.70 03/19/2018   LDLCALC 56 03/19/2018   TRIG 59.0 03/19/2018   CHOLHDL 2 03/19/2018   B12 Deficiency -receives monthly B12 injections  Foot Drop - Bilateral, R>L.  Really doing rehab twice a month, started did not July.  He does not notice any changes.  Continues to feel "wobbly" and he does not want to use a cane.  Denies any falls  Cystic Kidney Disease -reports being followed by urology.  He recently had a kidney ultrasound which she states was "normal".  Health Maintenance: Dental -- Routine Care  Vision -- Routine  Immunizations -- UTD Colonoscopy -- No longer needs  Treatment Team  - Cardiology - Dr. Johnsie Cancel  - Dermatology - [facility] - yearly.  -  Urology - Dr. Rosana Hoes    Past Medical History:  Diagnosis Date  . Arthritis    "right shoulder" (05/24/2015)  . CHEST PAIN   . Chronic bronchitis ([place])    "get it ~ q yr" (05/24/2015)  . COLONIC POLYPS, HX OF   . COPD (chronic obstructive pulmonary disease) ([place])    "dx'd but I don't take RX for it" (05/24/2015)  . Coronary artery disease   . Cystic kidney disease   . GERD (gastroesophageal reflux disease)   . History of hiatal hernia   . MI (myocardial infarction) ([place]) 09/2010  . Mixed hyperlipidemia   . Skin cancer    "top of my head only" (05/24/2015)  . Sleep apnea   . Syncope and collapse ~ 2013; 05/24/2015    Past Surgical History:  Procedure Laterality Date  . CARDIAC CATHETERIZATION  09/2010  . CHOLECYSTECTOMY OPEN  1998  . COLECTOMY  1998   "partial"  . CORONARY ANGIOPLASTY    . CORONARY ARTERY BYPASS GRAFT  09/2010   Median sternotomy for coronary artery bypass grafting x3  (left internal mammary artery to distal left anterior  descending  coronary artery, saphenous vein graft to first diagonal branch,  saphenous vein graft to first  obtuse marginal branch, endoscopic  saphenous vein harvest from right thigh). SURGEON:  Valentina Gu.  Roxy Manns, MD  ASSISTANT:  John Giovanni, PA-C  ANESTHESIA:  Glynda Jaeger, MD   . LEFT HEART CATH AND CORS/GRAFTS ANGIOGRAPHY N/A 08/22/2017  Procedure: LEFT HEART CATH AND CORS/GRAFTS ANGIOGRAPHY;  Surgeon: Martinique, Peter M, MD;  Location: [facility];  Service: Cardiovascular;  Laterality: N/A;  . SKIN CANCER EXCISION     "top of my head"    Current Outpatient Medications on File Prior to Visit  Medication Sig Dispense Refill  . aspirin 81 MG tablet Take 81 mg by mouth daily.    Marland Kitchen atorvastatin (LIPITOR) 20 MG tablet TAKE 1 TABLET (20 MG TOTAL) BY MOUTH DAILY. 90 tablet 1  . cyanocobalamin (,VITAMIN B-12,) 1000 MCG/ML injection INJECT 1ML INTRAMUSCULARLY EVERY WEEK FOR TWO WEEKS THEN 1ML ONCE A MONTH THEREAFTER (Patient taking  differently: INJECT 1ML INTRAMUSCULARLY ONCE A MONTH) 13 mL 0  . diphenhydrAMINE (BENADRYL) 25 MG tablet Take 25 mg by mouth once as needed for allergies (bee sting).    . EPINEPHrine (EPIPEN 2-PAK) 0.3 mg/0.3 mL IJ SOAJ injection     . ketoconazole (NIZORAL) 2 % shampoo APPLY EVERY OTHER DAY TO SCALP--LATHER 5 MINUTES,THEN RINSE OUT 120 mL 3  . metoprolol tartrate (LOPRESSOR) 25 MG tablet TAKE 1 TABLET (25 MG TOTAL) BY MOUTH 2 (TWO) TIMES DAILY. 180 tablet 3  . nitroGLYCERIN (NITROSTAT) 0.4 MG SL tablet Place 1 tablet (0.4 mg total) under the tongue every 5 (five) minutes as needed for chest pain (3 doses max). 25 tablet 1   No current facility-administered medications on file prior to visit.     Allergies  Allergen Reactions  . Levaquin [Levofloxacin In D5w] Swelling and Other (See Comments)    Eyes swollen  . Other Anaphylaxis and Other (See Comments)    Bee sting  . Metformin And Related Nausea Only    Family History  Problem Relation Age of Onset  . Lung cancer Sister   . Heart attack Father     Social History   Socioeconomic History  . Marital status: Married    Spouse name: Not on file  . Number of children: Not on file  . Years of education: Not on file  . Highest education level: Not on file  Occupational History  . Not on file  Social Needs  . Financial resource strain: Not on file  . Food insecurity:    Worry: Not on file    Inability: Not on file  . Transportation needs:    Medical: Not on file    Non-medical: Not on file  Tobacco Use  . Smoking status: Former Smoker    Packs/day: 1.50    Years: 3.00    Pack years: 4.50    Types: Cigarettes    Last attempt to quit: 12/09/1951    Years since quitting: 66.4  . Smokeless tobacco: Never Used  Substance and Sexual Activity  . Alcohol use: Yes    Comment: "no alcohol since 1953"  . Drug use: No  . Sexual activity: Not on file  Lifestyle  . Physical activity:    Days per week: Not on file    Minutes per  session: Not on file  . Stress: Not on file  Relationships  . Social connections:    Talks on phone: Not on file    Gets together: Not on file    Attends religious service: Not on file    Active member of club or organization: Not on file    Attends meetings of clubs or organizations: Not on file    Relationship status: Not on file  . Intimate partner violence:    Fear of current or ex  partner: Not on file    Emotionally abused: Not on file    Physically abused: Not on file    Forced sexual activity: Not on file  Other Topics Concern  . 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   Eyes: Negative.   Cardiovascular: Negative.   Genitourinary: Negative.   Musculoskeletal: Negative.   Neurological: Negative.   Psychiatric/Behavioral: Negative.   All other systems reviewed and are negative.     BP 120/60   Temp 97.9 F (36.6 C) (Oral)   Wt 187 lb (84.8 kg)   BMI 26.08 kg/m   Physical Exam  Constitutional: He is oriented to person, place, and time. He appears well-developed and well-nourished. No distress.  Cardiovascular: Normal rate, regular rhythm, normal heart sounds and intact distal pulses. Exam reveals no friction rub.  No murmur heard. Pulmonary/Chest: Effort normal and breath sounds normal. No stridor. No respiratory distress. He has no wheezes. He has no rales. He exhibits no tenderness.  Abdominal: Soft. Bowel sounds are normal. He exhibits no distension and no mass. There is no tenderness. There is no rebound and no guarding. No hernia.  Neurological: He is alert and oriented to person, place, and time.  Skin: Skin is warm and dry. He is not diaphoretic.  Psychiatric: He has a normal mood and affect. His behavior is normal. Judgment and thought content normal.  Vitals reviewed.   Recent Results (from the past 2160 hour(s))  CBC with Differential     Status: Abnormal     Collection Time: 03/03/18  3:14 PM  Result Value Ref Range   WBC 9.6 4.0 - 10.5 K/uL   RBC 3.74 (L) 4.22 - 5.81 MIL/uL   Hemoglobin 12.0 (L) 13.0 - 17.0 g/dL   HCT 37.2 (L) 39.0 - 52.0 %   MCV 99.5 78.0 - 100.0 fL   MCH 32.1 26.0 - 34.0 pg   MCHC 32.3 30.0 - 36.0 g/dL   RDW 13.1 11.5 - 15.5 %   Platelets 152 150 - 400 K/uL   Neutrophils Relative % 34 %   Neutro Abs 3.3 1.7 - 7.7 K/uL   Lymphocytes Relative 62 %   Lymphs Abs 5.9 (H) 0.7 - 4.0 K/uL   Monocytes Relative 3 %   Monocytes Absolute 0.3 0.1 - 1.0 K/uL   Eosinophils Relative 1 %   Eosinophils Absolute 0.1 0.0 - 0.7 K/uL   Basophils Relative 0 %   Basophils Absolute 0.0 0.0 - 0.1 K/uL    Comment: Performed at [facility], [address], [place], [place] [address]  I-stat troponin, ED     Status: None   Collection Time: 03/03/18  3:35 PM  Result Value Ref Range   Troponin i, poc 0.00 0.00 - 0.08 ng/mL   Comment 3            Comment: Due to the release kinetics of cTnI, a negative result within the first hours of the onset of symptoms does not rule out myocardial infarction with certainty. If myocardial infarction is still suspected, repeat the test at appropriate intervals.   CBG monitoring, ED     Status: Abnormal   Collection Time: 03/03/18  3:36 PM  Result Value Ref Range   Glucose-Capillary 191 (H) 70 - 99 mg/dL  I-stat Chem 8, ED     Status: Abnormal   Collection Time:  03/03/18  3:37 PM  Result Value Ref Range   Sodium 144 135 - 145 mmol/L   Potassium 3.0 (L) 3.5 - 5.1 mmol/L   Chloride 108 98 - 111 mmol/L   BUN 30 (H) 8 - 23 mg/dL   Creatinine, Ser 0.90 0.61 - 1.24 mg/dL   Glucose, Bld 169 (H) 70 - 99 mg/dL   Calcium, Ion 1.17 1.15 - 1.40 mmol/L   TCO2 23 22 - 32 mmol/L   Hemoglobin 11.9 (L) 13.0 - 17.0 g/dL   HCT 35.0 (L) 39.0 - 52.0 %  TSH     Status: None   Collection Time: 03/19/18  8:51 AM  Result Value Ref Range   TSH 3.17 0.35 - 4.50 uIU/mL  Lipid panel     Status: None   Collection Time:  03/19/18  8:51 AM  Result Value Ref Range   Cholesterol 128 0 - 200 mg/dL    Comment: ATP III Classification       Desirable:  < 200 mg/dL               Borderline High:  200 - 239 mg/dL          High:  > = 240 mg/dL   Triglycerides 59.0 0.0 - 149.0 mg/dL    Comment: Normal:  <150 mg/dLBorderline High:  150 - 199 mg/dL   HDL 59.70 >39.00 mg/dL   VLDL 11.8 0.0 - 40.0 mg/dL   LDL Cholesterol 56 0 - 99 mg/dL   Total CHOL/HDL Ratio 2     Comment:                Men          Women1/2 Average Risk     3.4          3.3Average Risk          5.0          4.42X Average Risk          9.6          7.13X Average Risk          15.0          11.0                       NonHDL 67.97     Comment: NOTE:  Non-HDL goal should be 30 mg/dL higher than patient's LDL goal (i.e. LDL goal of < 70 mg/dL, would have non-HDL goal of < 100 mg/dL)  Comprehensive metabolic panel     Status: Abnormal   Collection Time: 03/19/18  8:51 AM  Result Value Ref Range   Sodium 141 135 - 145 mEq/L   Potassium 4.4 3.5 - 5.1 mEq/L   Chloride 104 96 - 112 mEq/L   CO2 30 19 - 32 mEq/L   Glucose, Bld 127 (H) 70 - 99 mg/dL   BUN 24 (H) 6 - 23 mg/dL   Creatinine, Ser 0.79 0.40 - 1.50 mg/dL   Total Bilirubin 0.8 0.2 - 1.2 mg/dL   Alkaline Phosphatase 61 39 - 117 U/L   AST 16 0 - 37 U/L   ALT 15 0 - 53 U/L   Total Protein 6.4 6.0 - 8.3 g/dL   Albumin 4.2 3.5 - 5.2 g/dL   Calcium 9.3 8.4 - 10.5 mg/dL   GFR 99.38 >60.00 mL/min  Hemoglobin A1c     Status: Abnormal   Collection Time: 03/19/18  8:51 AM  Result Value  Ref Range   Hgb A1c MFr Bld 7.0 (H) 4.6 - 6.5 %    Comment: Glycemic Control Guidelines for People with Diabetes:Non Diabetic:  <6%Goal of Therapy: <7%Additional Action Suggested:  >8%   CBC with Differential/Platelet     Status: Abnormal   Collection Time: 03/19/18  8:51 AM  Result Value Ref Range   WBC 4.8 4.0 - 10.5 K/uL   RBC 3.95 (L) 4.22 - 5.81 Mil/uL   Hemoglobin 13.1 13.0 - 17.0 g/dL   HCT 38.4 (L) 39.0 - 52.0  %   MCV 97.2 78.0 - 100.0 fl   MCHC 34.2 30.0 - 36.0 g/dL   RDW 13.7 11.5 - 15.5 %   Platelets 147.0 (L) 150.0 - 400.0 K/uL   Neutrophils Relative % 53.8 43.0 - 77.0 %   Lymphocytes Relative 32.9 12.0 - 46.0 %   Monocytes Relative 8.6 3.0 - 12.0 %   Eosinophils Relative 3.7 0.0 - 5.0 %   Basophils Relative 1.0 0.0 - 3.0 %   Neutro Abs 2.6 1.4 - 7.7 K/uL   Lymphs Abs 1.6 0.7 - 4.0 K/uL   Monocytes Absolute 0.4 0.1 - 1.0 K/uL   Eosinophils Absolute 0.2 0.0 - 0.7 K/uL   Basophils Absolute 0.0 0.0 - 0.1 K/uL  Microalbumin / creatinine urine ratio     Status: None   Collection Time: 03/19/18  8:51 AM  Result Value Ref Range   Microalb, Ur <0.7 0.0 - 1.9 mg/dL   Creatinine,U 81.3 mg/dL   Microalb Creat Ratio 0.9 0.0 - 30.0 mg/g  Vitamin B12     Status: None   Collection Time: 03/19/18  8:51 AM  Result Value Ref Range   Vitamin B-12 337 211 - 911 pg/mL    Assessment/Plan: 1. Encounter to establish care -CPE in July 2020 -Follow-up sooner for any acute issues  2. Type 2 diabetes mellitus with neurological complications (HCC) -We talked about possible side effects of metformin, I am not convinced that his abdominal discomfort 2 months ago with the result of metformin since he has been on this medication for a little over a year.  He is okay with trying again, and will follow-up if abdominal pain returns.  Consider other agent.  He will follow-up in 6 months if he continues metformin for repeat A1c  3. Coronary artery disease involving native coronary artery of native heart without angina pectoris -Continue with plan of care by cardiology  4. B12 deficiency -Continue with B12 supplement  5. Mixed hyperlipidemia -Continue with statin and aspirin.  Lipids well controlled.   6. Foot drop, bilateral -Urged to continue with rehab.  I encouraged him to get a cane but he refuses at this time  7. Cystic kidney disease -Continue  with plan of care by urology   Dorothyann Peng, NP

## 2018-05-07 NOTE — Patient Instructions (Signed)
I would like to see you every six months   Please restart metformin and see what happens, if the stomach pain returns please contact me

## 2018-05-08 ENCOUNTER — Ambulatory Visit: Payer: Medicare Other | Admitting: Physical Therapy

## 2018-05-08 ENCOUNTER — Encounter: Payer: Self-pay | Admitting: Physical Therapy

## 2018-05-08 DIAGNOSIS — M6281 Muscle weakness (generalized): Secondary | ICD-10-CM

## 2018-05-08 DIAGNOSIS — R2681 Unsteadiness on feet: Secondary | ICD-10-CM | POA: Diagnosis not present

## 2018-05-08 NOTE — Therapy (Signed)
[facility] [place], [place], [address] Phone: [phone]   Fax:  [phone]  Physical Therapy Treatment  Patient Details  Name: [name] MRN: [id number] Date of Birth: 1932-03-11 Referring Provider: Bluford Kaufmann MD.   Encounter Date: 05/08/2018  PT End of Session - 05/08/18 1015    Visit Number  4    Number of Visits  12    Date for PT Re-Evaluation  06/29/18    PT Start Time  0945    PT Stop Time  1033    PT Time Calculation (min)  48 min    Activity Tolerance  Patient tolerated treatment well    Behavior During Therapy  [facility] for tasks assessed/performed       Past Medical History:  Diagnosis Date  . Arthritis    "right shoulder" (05/24/2015)  . CHEST PAIN   . Chronic bronchitis ([place])    "get it ~ q yr" (05/24/2015)  . COLONIC POLYPS, HX OF   . COPD (chronic obstructive pulmonary disease) ([place])    "dx'd but I don't take RX for it" (05/24/2015)  . Coronary artery disease   . Cystic kidney disease   . GERD (gastroesophageal reflux disease)   . History of hiatal hernia   . MI (myocardial infarction) ([place]) 09/2010  . Mixed hyperlipidemia   . Skin cancer    "top of my head only" (05/24/2015)  . Sleep apnea   . Syncope and collapse ~ 2013; 05/24/2015    Past Surgical History:  Procedure Laterality Date  . CARDIAC CATHETERIZATION  09/2010  . CHOLECYSTECTOMY OPEN  1998  . COLECTOMY  1998   "partial"  . CORONARY ANGIOPLASTY    . CORONARY ARTERY BYPASS GRAFT  09/2010   Median sternotomy for coronary artery bypass grafting x3  (left internal mammary artery to distal left anterior  descending  coronary artery, saphenous vein graft to first diagonal branch,  saphenous vein graft to first  obtuse marginal branch, endoscopic  saphenous vein harvest from right thigh). SURGEON:  Valentina Gu.  Roxy Manns, MD  ASSISTANT:  John Giovanni, PA-C  ANESTHESIA:  Glynda Jaeger, MD   . LEFT HEART CATH AND CORS/GRAFTS  ANGIOGRAPHY N/A 08/22/2017   Procedure: LEFT HEART CATH AND CORS/GRAFTS ANGIOGRAPHY;  Surgeon: Martinique, Peter M, MD;  Location: [facility];  Service: Cardiovascular;  Laterality: N/A;  . SKIN CANCER EXCISION     "top of my head"    There were no vitals filed for this visit.  Subjective Assessment - 05/08/18 1045    Subjective  Patient reported feeling woozy this morning.    Pertinent History  DM; MI; h/o LBP; arthritis; COPD.    How long can you walk comfortably?  A community distance.    Diagnostic tests  NCV.    Patient Stated Goals  Walk better.    Currently in Pain?  No/denies         [facility] PT Assessment - 05/08/18 0001      Assessment   Medical Diagnosis  Bilateral foot drop.      Precautions   Precautions  Fall      Restrictions   Weight Bearing Restrictions  No      Berg Balance Test   Sit to Stand  Able to stand without using hands and stabilize independently    Standing Unsupported  Able to stand safely 2 minutes    Sitting with Back Unsupported but Feet Supported on Floor or  Stool  Able to sit safely and securely 2 minutes    Stand to Sit  Controls descent by using hands    Transfers  Able to transfer safely, minor use of hands    Standing Unsupported with Eyes Closed  Able to stand 10 seconds with supervision    Standing Ubsupported with Feet Together  Able to place feet together independently and stand for 1 minute with supervision    From Standing, Reach Forward with Outstretched Arm  Can reach confidently >25 cm (10")    From Standing Position, Pick up Object from [place] to pick up shoe safely and easily    From Standing Position, Turn to Look Behind Over each Shoulder  Looks behind one side only/other side shows less weight shift    Turn 360 Degrees  Able to turn 360 degrees safely one side only in 4 seconds or less    Standing Unsupported, Alternately Place Feet on Step/Stool  Able to complete 4 steps without aid or supervision    Standing  Unsupported, One Foot in [employer] balance while stepping or standing    Standing on One Leg  Tries to lift leg/unable to hold 3 seconds but remains standing independently    Total Score  42                   [facility] Adult PT Treatment/Exercise - 05/08/18 0001      Exercises   Exercises  Ankle;Knee/Hip      Knee/Hip Exercises: Seated   Clamshell with TheraBand  Red   2 minutes   Hamstring Curl  Strengthening;Both;4 sets;10 reps    Hamstring Limitations  red theraband      Ankle Exercises: Aerobic   Nustep  Level 5 x15 minutes with emphasis of foot flat on plates to improve DF      Ankle Exercises: Supine   T-Band  red T-Band DF and PF x30 each    Other Supine Ankle Exercises  inversion squeeze 3x10          Balance Exercises - 05/08/18 1028      Balance Exercises: Standing   Sidestepping  2 reps   carpeted hall way         PT Short Term Goals - 05/08/18 1015      PT SHORT TERM GOAL #1   Title  Independent with an initial HEP.    Time  2    Period  Weeks    Status  Achieved      PT SHORT TERM GOAL #2   Title  Improve Berg score to 42/56.    Time  2    Period  Weeks    Status  Achieved        PT Long Term Goals - 05/08/18 1018      PT LONG TERM GOAL #1   Title  Independent with an advanced HEP.    Period  Weeks    Status  On-going      PT LONG TERM GOAL #2   Title  Improve bilateral Michael strength to 5/5.    Time  8    Period  Weeks    Status  On-going      PT LONG TERM GOAL #3   Title  Improve Berg score to 48/56.    Time  8    Period  Weeks    Status  On-going   42/56     PT LONG TERM GOAL #4  Title  Walk without ataxia or scissoring.    Time  8    Period  Weeks    Status  On-going            Plan - 05/08/18 1025    Clinical Impression Statement  Blood pressure was assess prior to the start of the nustep secondary to feeling "woozy." Patient's BP 126/67 mmHg to which patient reported was within his normal BP range.  Patient was able to tolerate treatment well. Berg Balance Scale was assessed 42/56 which has improved since inital evaluation. Patient continues to demonstrate difficulties with NBOS tasks. Patient required CG assist to prevent loss off balance secondary to 1 instance retropulsion during sidestepping. Patient educated importance of strengthening hip and core musculature to help maintain balance. Patient reported understanding.    Clinical Presentation  Evolving    Clinical Decision Making  Moderate    Rehab Potential  Good    PT Frequency  2x / week    PT Duration  6 weeks    PT Treatment/Interventions  ADLs/Self Care Home Management;Gait training;Therapeutic activities;Therapeutic exercise;Balance training;Neuromuscular re-education;Patient/family education    PT Next Visit Plan  core and hip strengthening; Michael strength to include ankle isolator for ankle strength; balance activites to include dynadisc and BOSU ball.    Consulted and Agree with Plan of Care  Patient       Patient will benefit from skilled therapeutic intervention in order to improve the following deficits and impairments:  Abnormal gait, Decreased balance, Postural dysfunction, Decreased coordination, Difficulty walking  Visit Diagnosis: Unsteadiness on feet  Muscle weakness (generalized)     Problem List Patient Active Problem List   Diagnosis Date Noted  . Urine stream spraying 02/23/2018  . Flank pain 02/22/2018  . Chest pain 08/21/2017  . Unstable angina ([place]) 08/21/2017  . Renal cyst 12/20/2016  . B12 deficiency 11/08/2016  . Type 2 diabetes mellitus with neurological complications ([place]) 76/54/6503  . Syncope and collapse 05/24/2015  . Benign prostatic hyperplasia with urinary obstruction 12/12/2014  . Carotid artery stenosis 05/22/2012  . CAD (coronary artery disease) 05/14/2012  . ED (erectile dysfunction) of organic origin 08/19/2011  . FH: CABG (coronary artery bypass surgery) 01/28/2011  . Mixed  hyperlipidemia 10/23/2010  . GERD (gastroesophageal reflux disease) 10/22/2010  . History of colonic polyps 06/14/2010   Gabriela Eves, PT, DPT 05/08/2018, 10:51 AM  [facility] [address] [place], [place], [address] Phone: [phone]   Fax:  [phone]  Name: Michael Johnston MRN: [id number] Date of Birth: [birth year]

## 2018-05-22 ENCOUNTER — Encounter: Payer: Self-pay | Admitting: *Deleted

## 2018-05-22 ENCOUNTER — Ambulatory Visit: Payer: Medicare Other | Attending: Internal Medicine | Admitting: *Deleted

## 2018-05-22 DIAGNOSIS — M6281 Muscle weakness (generalized): Secondary | ICD-10-CM

## 2018-05-22 DIAGNOSIS — R2681 Unsteadiness on feet: Secondary | ICD-10-CM | POA: Insufficient documentation

## 2018-05-22 NOTE — Therapy (Addendum)
[facility] [place], [place], [address] Phone: [phone]   Fax:  [phone]  Physical Therapy Treatment  Patient Details  Name: Michael Johnston MRN: [id number] Date of Birth: Jul 01, 1932 Referring Provider: Bluford Kaufmann MD.   Encounter Date: 05/22/2018  PT End of Session - 05/22/18 0952    Visit Number  5    Number of Visits  12    Date for PT Re-Evaluation  06/29/18    PT Start Time  0945    PT Stop Time  1034    PT Time Calculation (min)  49 min       Past Medical History:  Diagnosis Date  . Arthritis    "right shoulder" (05/24/2015)  . CHEST PAIN   . Chronic bronchitis ([place])    "get it ~ q yr" (05/24/2015)  . COLONIC POLYPS, HX OF   . COPD (chronic obstructive pulmonary disease) ([place])    "dx'd but I don't take RX for it" (05/24/2015)  . Coronary artery disease   . Cystic kidney disease   . GERD (gastroesophageal reflux disease)   . History of hiatal hernia   . MI (myocardial infarction) ([place]) 09/2010  . Mixed hyperlipidemia   . Skin cancer    "top of my head only" (05/24/2015)  . Sleep apnea   . Syncope and collapse ~ 2013; 05/24/2015    Past Surgical History:  Procedure Laterality Date  . CARDIAC CATHETERIZATION  09/2010  . CHOLECYSTECTOMY OPEN  1998  . COLECTOMY  1998   "partial"  . CORONARY ANGIOPLASTY    . CORONARY ARTERY BYPASS GRAFT  09/2010   Median sternotomy for coronary artery bypass grafting x3  (left internal mammary artery to distal left anterior  descending  coronary artery, saphenous vein graft to first diagonal branch,  saphenous vein graft to first  obtuse marginal branch, endoscopic  saphenous vein harvest from right thigh). SURGEON:  Valentina Gu.  Roxy Manns, MD  ASSISTANT:  John Giovanni, PA-C  ANESTHESIA:  Glynda Jaeger, MD   . LEFT HEART CATH AND CORS/GRAFTS ANGIOGRAPHY N/A 08/22/2017   Procedure: LEFT HEART CATH AND CORS/GRAFTS ANGIOGRAPHY;  Surgeon: Martinique, Peter M, MD;  Location:  [facility];  Service: Cardiovascular;  Laterality: N/A;  . SKIN CANCER EXCISION     "top of my head"    There were no vitals filed for this visit.  Subjective Assessment - 05/22/18 0950    Subjective  Pt doing about the same. Did ok after last Rx     Pertinent History  DM; MI; h/o LBP; arthritis; COPD.    How long can you walk comfortably?  A community distance.    Diagnostic tests  NCV.    Patient Stated Goals  Walk better.    Currently in Pain?  No/denies                       [facility] Adult PT Treatment/Exercise - 05/22/18 0001      Ambulation/Gait   Ambulation/Gait  Yes    Ambulation Distance (Feet)  100 Feet    Gait Pattern  Decreased stride length;Wide base of support    Ambulation Surface  Level      Exercises   Exercises  Ankle;Knee/Hip      Knee/Hip Exercises: Seated   Clamshell with TheraBand  Red   2 minutes   Hamstring Curl  Strengthening;Both;1 set;20 reps    Hamstring Limitations  red theraband  Ankle Exercises: Aerobic   Nustep  Level 5 x15 minutes with emphasis of foot flat on plates to improve DF      Ankle Exercises: Supine   T-Band  red T-Band DF x30 each    Other Supine Ankle Exercises  inversion squeeze 2x10 red tband          Balance Exercises - 05/22/18 1013      Balance Exercises: Standing   Standing Eyes Closed  Narrow base of support (BOS);4 reps    Tandem Stance  Eyes open;4 reps   8 sec max  with RT foot in front   Rockerboard  Anterior/posterior;Other time (comment)   5 minutes  CGA   Step Ups  6 inch   Toe taps s bouts, very challenging   Sit to Stand Time  2x10          PT Short Term Goals - 05/08/18 1015      PT SHORT TERM GOAL #1   Title  Independent with an initial HEP.    Time  2    Period  Weeks    Status  Achieved      PT SHORT TERM GOAL #2   Title  Improve Berg score to 42/56.    Time  2    Period  Weeks    Status  Achieved        PT Long Term Goals - 05/08/18 1018      PT  LONG TERM GOAL #1   Title  Independent with an advanced HEP.    Period  Weeks    Status  On-going      PT LONG TERM GOAL #2   Title  Improve bilateral LE strength to 5/5.    Time  8    Period  Weeks    Status  On-going      PT LONG TERM GOAL #3   Title  Improve Berg score to 48/56.    Time  8    Period  Weeks    Status  On-going   42/56     PT LONG TERM GOAL #4   Title  Walk without ataxia or scissoring.    Time  8    Period  Weeks    Status  On-going            Plan - 05/22/18 0953    Clinical Presentation  Evolving    Clinical Decision Making  Moderate    Rehab Potential  Good    PT Frequency  2x / week    PT Duration  6 weeks    PT Treatment/Interventions  ADLs/Self Care Home Management;Gait training;Therapeutic activities;Therapeutic exercise;Balance training;Neuromuscular re-education;Patient/family education    PT Next Visit Plan  core and hip strengthening; LE strength to include ankle isolator for ankle strength; balance activites to include dynadisc and BOSU ball.    Consulted and Agree with Plan of Care  Patient       Patient will benefit from skilled therapeutic intervention in order to improve the following deficits and impairments:  Abnormal gait, Decreased balance, Postural dysfunction, Decreased coordination, Difficulty walking  Visit Diagnosis: Unsteadiness on feet  Muscle weakness (generalized)     Problem List Patient Active Problem List   Diagnosis Date Noted  . Urine stream spraying 02/23/2018  . Flank pain 02/22/2018  . Chest pain 08/21/2017  . Unstable angina ([place]) 08/21/2017  . Renal cyst 12/20/2016  . B12 deficiency 11/08/2016  . Type 2 diabetes mellitus with neurological  complications ([place]) 94/85/4627  . Syncope and collapse 05/24/2015  . Benign prostatic hyperplasia with urinary obstruction 12/12/2014  . Carotid artery stenosis 05/22/2012  . CAD (coronary artery disease) 05/14/2012  . ED (erectile dysfunction) of organic  origin 08/19/2011  . FH: CABG (coronary artery bypass surgery) 01/28/2011  . Mixed hyperlipidemia 10/23/2010  . GERD (gastroesophageal reflux disease) 10/22/2010  . History of colonic polyps 06/14/2010    Dajsha Massaro,CHRIS, PTA 05/22/2018, 12:56 PM  [facility] [address] [place], [place], [address] Phone: [phone]   Fax:  [phone]  Name: YULIAN GOSNEY MRN: [id number] Date of Birth: 03/17/1932  PHYSICAL THERAPY DISCHARGE SUMMARY  Visits from Start of Care: 5.  Current functional level related to goals / functional outcomes: See above.   Remaining deficits: See below.   Education / Equipment: HEP. Plan: Patient agrees to discharge.  Patient goals were not met. Patient is being discharged due to not returning since the last visit.  ?????          Mali Applegate MPT

## 2018-05-26 DIAGNOSIS — H25813 Combined forms of age-related cataract, bilateral: Secondary | ICD-10-CM | POA: Diagnosis not present

## 2018-05-26 DIAGNOSIS — H353132 Nonexudative age-related macular degeneration, bilateral, intermediate dry stage: Secondary | ICD-10-CM | POA: Diagnosis not present

## 2018-06-24 ENCOUNTER — Other Ambulatory Visit: Payer: Self-pay | Admitting: Internal Medicine

## 2018-07-27 LAB — HM DIABETES EYE EXAM

## 2018-08-04 ENCOUNTER — Other Ambulatory Visit: Payer: Self-pay | Admitting: Internal Medicine

## 2018-09-22 ENCOUNTER — Ambulatory Visit: Payer: Medicare Other | Admitting: Adult Health

## 2018-09-22 DIAGNOSIS — Z0289 Encounter for other administrative examinations: Secondary | ICD-10-CM

## 2018-09-22 NOTE — Progress Notes (Deleted)
Subjective:    Patient ID: Michael Johnston, male    DOB: 02-Mar-1932, 85 y.o.   MRN: [id number]  HPI  85 year old male who  has a past medical history of Arthritis, CHEST PAIN, Chronic bronchitis ([place]), COLONIC POLYPS, HX OF, COPD (chronic obstructive pulmonary disease) ([place]), Coronary artery disease, Cystic kidney disease, GERD (gastroesophageal reflux disease), History of hiatal hernia, MI (myocardial infarction) ([place]) (09/2010), Mixed hyperlipidemia, Skin cancer, Sleep apnea, and Syncope and collapse (~ 2013; 05/24/2015).  To the office today for biannual follow-up.  History of diabetes mellitus which is currently controlled with metformin 500 mg twice daily.  He denies any feelings of hypoglycemia, nausea, vomiting, or diarrhea. Lab Results  Component Value Date   HGBA1C 7.0 (H) 03/19/2018    He has a history of CAD status post CABG.  Is followed by cardiology, currently prescribed Lipitor 20 mg and aspirin 81 mg.  Takes metoprolol 25 mg twice daily.  Denies chest pain, shortness of breath, or syncopal episodes.    Review of Systems  Constitutional: Negative.   HENT: Negative.   Eyes: Negative.   Respiratory: Negative.   Cardiovascular: Negative.   Gastrointestinal: Negative.   Endocrine: Negative.   Genitourinary: Negative.   Musculoskeletal: Negative.   Skin: Negative.   Allergic/Immunologic: Negative.   Neurological: Negative.   Hematological: Negative.   Psychiatric/Behavioral: Negative.   All other systems reviewed and are negative.  See HPI   Past Medical History:  Diagnosis Date  . Arthritis    "right shoulder" (05/24/2015)  . CHEST PAIN   . Chronic bronchitis ([place])    "get it ~ q yr" (05/24/2015)  . COLONIC POLYPS, HX OF   . COPD (chronic obstructive pulmonary disease) ([place])    "dx'd but I don't take RX for it" (05/24/2015)  . Coronary artery disease   . Cystic kidney disease   . GERD (gastroesophageal reflux disease)   . History of hiatal hernia   . MI  (myocardial infarction) ([place]) 09/2010  . Mixed hyperlipidemia   . Skin cancer    "top of my head only" (05/24/2015)  . Sleep apnea   . Syncope and collapse ~ 2013; 05/24/2015    Social History   Socioeconomic History  . Marital status: Married    Spouse name: Not on file  . Number of children: Not on file  . Years of education: Not on file  . Highest education level: Not on file  Occupational History  . Not on file  Social Needs  . Financial resource strain: Not on file  . Food insecurity:    Worry: Not on file    Inability: Not on file  . Transportation needs:    Medical: Not on file    Non-medical: Not on file  Tobacco Use  . Smoking status: Former Smoker    Packs/day: 1.50    Years: 3.00    Pack years: 4.50    Types: Cigarettes    Last attempt to quit: 12/09/1951    Years since quitting: 66.8  . Smokeless tobacco: Never Used  Substance and Sexual Activity  . Alcohol use: Yes    Comment: "no alcohol since 1953"  . Drug use: No  . Sexual activity: Not on file  Lifestyle  . Physical activity:    Days per week: Not on file    Minutes per session: Not on file  . Stress: Not on file  Relationships  . Social connections:    Talks on phone: Not  on file    Gets together: Not on file    Attends religious service: Not on file    Active member of club or organization: Not on file    Attends meetings of clubs or organizations: Not on file    Relationship status: Not on file  . Intimate partner violence:    Fear of current or ex partner: Not on file    Emotionally abused: Not on file    Physically abused: Not on file    Forced sexual activity: Not on file  Other Topics Concern  . 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 CARDIAC CATHETERIZATION  09/2010  . CHOLECYSTECTOMY OPEN  1998  . COLECTOMY  1998   "partial"  . CORONARY ANGIOPLASTY    . CORONARY ARTERY  BYPASS GRAFT  09/2010   Median sternotomy for coronary artery bypass grafting x3  (left internal mammary artery to distal left anterior  descending  coronary artery, saphenous vein graft to first diagonal branch,  saphenous vein graft to first  obtuse marginal branch, endoscopic  saphenous vein harvest from right thigh). SURGEON:  Valentina Gu.  Roxy Manns, MD  ASSISTANT:  John Giovanni, PA-C  ANESTHESIA:  Glynda Jaeger, MD   . LEFT HEART CATH AND CORS/GRAFTS ANGIOGRAPHY N/A 08/22/2017   Procedure: LEFT HEART CATH AND CORS/GRAFTS ANGIOGRAPHY;  Surgeon: Martinique, Peter M, MD;  Location: [facility];  Service: Cardiovascular;  Laterality: N/A;  . SKIN CANCER EXCISION     "top of my head"    Family History  Problem Relation Age of Onset  . Lung cancer Sister   . Heart attack Father     Allergies  Allergen Reactions  . Levaquin [Levofloxacin In D5w] Swelling and Other (See Comments)    Eyes swollen  . Other Anaphylaxis and Other (See Comments)    Bee sting  . Metformin And Related Nausea Only    Current Outpatient Medications on File Prior to Visit  Medication Sig Dispense Refill  . aspirin 81 MG tablet Take 81 mg by mouth daily.    Marland Kitchen atorvastatin (LIPITOR) 20 MG tablet TAKE 1 TABLET (20 MG TOTAL) BY MOUTH DAILY. 90 tablet 1  . B-D 3CC LUER-LOK SYR 25GX1" 25G X 1" 3 ML MISC USE AS DIRECTED 2 each 19  . cyanocobalamin (,VITAMIN B-12,) 1000 MCG/ML injection INJECT 1ML INTRAMUSCULARLY EVERY WEEK FOR TWO WEEKS THEN 1ML ONCE A MONTH THEREAFTER (Patient taking differently: INJECT 1ML INTRAMUSCULARLY ONCE A MONTH) 13 mL 0  . diphenhydrAMINE (BENADRYL) 25 MG tablet Take 25 mg by mouth once as needed for allergies (bee sting).    . EPINEPHrine (EPIPEN 2-PAK) 0.3 mg/0.3 mL IJ SOAJ injection     . ketoconazole (NIZORAL) 2 % shampoo APPLY EVERY OTHER DAY TO SCALP--LATHER 5 MINUTES,THEN RINSE OUT 120 mL 3  . metoprolol tartrate (LOPRESSOR) 25 MG tablet TAKE 1 TABLET (25 MG TOTAL) BY MOUTH 2 (TWO) TIMES  DAILY. 180 tablet 3  . nitroGLYCERIN (NITROSTAT) 0.4 MG SL tablet Place 1 tablet (0.4 mg total) under the tongue every 5 (five) minutes as needed for chest pain (3 doses max). 25 tablet 1   No current facility-administered medications on file prior to visit.     There were no vitals taken for this visit.      Objective:   Physical Exam Vitals  signs and nursing note reviewed.  Constitutional:      Appearance: Normal appearance.  Cardiovascular:     Rate and Rhythm: Normal rate and regular rhythm.     Pulses: Normal pulses.     Heart sounds: Normal heart sounds.  Pulmonary:     Effort: Pulmonary effort is normal.     Breath sounds: Normal breath sounds.  Skin:    General: Skin is dry.     Capillary Refill: Capillary refill takes less than 2 seconds.  Neurological:     General: No focal deficit present.     Mental Status: He is alert and oriented to person, place, and time.  Psychiatric:        Mood and Affect: Mood normal.        Behavior: Behavior normal.        Thought Content: Thought content normal.        Judgment: Judgment normal.           Assessment & Plan:

## 2018-09-28 ENCOUNTER — Other Ambulatory Visit: Payer: Self-pay | Admitting: Internal Medicine

## 2018-10-05 NOTE — Telephone Encounter (Signed)
Refill request sent under Dr Raliegh Ip.   Pt has been advised to let pharmacy know he has new PCP and refill requests need to be sent to Cory's attention.

## 2018-10-05 NOTE — Progress Notes (Signed)
Patient ID: Michael Johnston, male   DOB: Jul 20, 1932, 85 y.o.   MRN: [id number]    Michael Johnston is seen for F/U CAD SEMI after getting injections at the dentist office September 27, 2010. Subsequently found to have 3VD needing CABG: LIMA to LAD, SVG to D1, SVG to OM  RCA was not grafted    Admitted 08/21/17 for chest pain  R/O CXR NAD no acute ECG changes Cath done by Dr Martinique 08/22/17 patent Grafts normal EF and EDP RCA with no obstructive Disease   Carotid 12/28/15 plaque no stenosis due for f/u Last TTE 05/25/15 EF 55-60% no valve disease   No issues since d/c   ROS: Denies fever, malais, weight loss, blurry vision, decreased visual acuity, cough, sputum, SOB, hemoptysis, pleuritic pain, palpitaitons, heartburn, abdominal pain, melena, lower extremity edema, claudication, or rash.  All other systems reviewed and negative  General: BP 130/68   Pulse 85   Ht 5\' 11"  (1.803 m)   Wt 190 lb (86.2 kg)   BMI 26.50 kg/m  Affect appropriate Healthy:  appears stated age 85: normal Neck supple with no adenopathy JVP normal no bruits no thyromegaly Lungs clear with no wheezing and good diaphragmatic motion Heart:  S1/S2 no murmur, no rub, gallop or click PMI normal post sternotomy  Abdomen: benighn, BS positve, no tenderness, no AAA no bruit.  No HSM or HJR Distal pulses intact with no bruits No edema Neuro non-focal Skin warm and dry No muscular weakness Left radial cath sight A     Current Outpatient Medications  Medication Sig Dispense Refill  . aspirin 81 MG tablet Take 81 mg by mouth daily.    Marland Kitchen atorvastatin (LIPITOR) 20 MG tablet TAKE 1 TABLET (20 MG TOTAL) BY MOUTH DAILY. 90 tablet 3  . B-D 3CC LUER-LOK SYR 25GX1" 25G X 1" 3 ML MISC USE AS DIRECTED 2 each 19  . cyanocobalamin (,VITAMIN B-12,) 1000 MCG/ML injection INJECT 1ML INTRAMUSCULARLY EVERY WEEK FOR TWO WEEKS THEN 1ML ONCE A MONTH THEREAFTER (Patient taking differently: INJECT 1ML INTRAMUSCULARLY ONCE A MONTH) 13 mL 0  .  diphenhydrAMINE (BENADRYL) 25 MG tablet Take 25 mg by mouth once as needed for allergies (bee sting).    . EPINEPHrine (EPIPEN 2-PAK) 0.3 mg/0.3 mL IJ SOAJ injection     . ketoconazole (NIZORAL) 2 % shampoo APPLY EVERY OTHER DAY TO SCALP--LATHER 5 MINUTES,THEN RINSE OUT 120 mL 3  . metoprolol tartrate (LOPRESSOR) 25 MG tablet TAKE 1 TABLET (25 MG TOTAL) BY MOUTH 2 (TWO) TIMES DAILY. 180 tablet 3  . nitroGLYCERIN (NITROSTAT) 0.4 MG SL tablet Place 1 tablet (0.4 mg total) under the tongue every 5 (five) minutes as needed for chest pain (3 doses max). 25 tablet 1   No current facility-administered medications for this visit.     Allergies  Levaquin [levofloxacin in d5w]; Other; and Metformin and related  Electrocardiogram:   01/26/14  SR rate 54 normal  01/26/15  SR rate 64  LAD LVH no significant change  10/16/16  SR ate 81 PAC LVH nonspecific ST changes   Assessment and Plan  CAD:  CABG 2012  Cath 08/22/17 patent grafts no new disease in RCA normal EF continue medical Rx  HTN:  Well controlled.  Continue current medications and low sodium Dash type diet.    Cholesterol is at goal.  Continue current dose of statin and diet Rx.  No myalgias or side effects.  F/U  LFT's in 6 months. Lab Results  Component Value  Date   LDLCALC 56 03/19/2018   DM:  Discussed low carb diet.  Target hemoglobin A1c is 6.5 or less.  Continue current medications.  Carotid:  Duplex 12/28/15 plaque no stenosis f/u ordered          F/U with me in a year   [name]

## 2018-10-08 ENCOUNTER — Ambulatory Visit: Payer: Medicare Other | Admitting: Cardiovascular Disease

## 2018-10-08 VITALS — BP 130/68 | HR 85 | Ht 71.0 in | Wt 190.0 lb

## 2018-10-08 DIAGNOSIS — Z951 Presence of aortocoronary bypass graft: Secondary | ICD-10-CM

## 2018-10-08 NOTE — Patient Instructions (Signed)
Medication Instructions:   If you need a refill on your cardiac medications before your next appointment, please call your pharmacy.   Lab work:  If you have labs (blood work) drawn today and your tests are completely normal, you will receive your results only by: Marland Kitchen MyChart Message (if you have MyChart) OR . A paper copy in the mail If you have any lab test that is abnormal or we need to change your treatment, we will call you to review the results.  Testing/Procedures: NONE ordered today.  Follow-Up: At [facility], you and your health needs are our priority.  As part of our continuing mission to provide you with exceptional heart care, we have created designated Provider Care Teams.  These Care Teams include your primary Cardiologist (physician) and Advanced Practice Providers (APPs -  Physician Assistants and Nurse Practitioners) who all work together to provide you with the care you need, when you need it. You will need a follow up appointment in 1 years.  Please call our office 2 months in advance to schedule this appointment.  You may see Dr. Johnsie Cancel or one of the following Advanced Practice Providers on your designated Care Team:   Truitt Merle, NP Cecilie Kicks, NP . Kathyrn Drown, NP

## 2018-11-03 DIAGNOSIS — H2181 Floppy iris syndrome: Secondary | ICD-10-CM | POA: Insufficient documentation

## 2018-11-03 DIAGNOSIS — H25811 Combined forms of age-related cataract, right eye: Secondary | ICD-10-CM | POA: Insufficient documentation

## 2018-11-06 ENCOUNTER — Ambulatory Visit (INDEPENDENT_AMBULATORY_CARE_PROVIDER_SITE_OTHER): Payer: Medicare Other | Admitting: Adult Health

## 2018-11-06 ENCOUNTER — Encounter: Payer: Self-pay | Admitting: Adult Health

## 2018-11-06 VITALS — BP 114/60 | Temp 97.7°F | Wt 191.0 lb

## 2018-11-06 DIAGNOSIS — E1149 Type 2 diabetes mellitus with other diabetic neurological complication: Secondary | ICD-10-CM | POA: Diagnosis not present

## 2018-11-06 DIAGNOSIS — E538 Deficiency of other specified B group vitamins: Secondary | ICD-10-CM | POA: Diagnosis not present

## 2018-11-06 LAB — BASIC METABOLIC PANEL
BUN: 20 mg/dL (ref 6–23)
CO2: 29 mEq/L (ref 19–32)
Calcium: 9.2 mg/dL (ref 8.4–10.5)
Chloride: 104 mEq/L (ref 96–112)
Creatinine, Ser: 0.74 mg/dL (ref 0.40–1.50)
GFR: 100.68 mL/min (ref >60.00)
Glucose, Bld: 135 mg/dL — ABNORMAL HIGH (ref 70–99)
Potassium: 4.5 mEq/L (ref 3.5–5.1)
Sodium: 141 mEq/L (ref 135–145)

## 2018-11-06 LAB — POCT GLYCOSYLATED HEMOGLOBIN (HGB A1C): HbA1c, POC (controlled diabetic range): 6.7 % (ref 0.0–7.0)

## 2018-11-06 LAB — VITAMIN B12: Vitamin B-12: 321 pg/mL (ref 211–911)

## 2018-11-06 NOTE — Patient Instructions (Signed)
It was great seeing you today   Your A1c was 6.7 - this is good   We will follow up with you regarding your blood work   We will see each other in July for your physical

## 2018-11-06 NOTE — Progress Notes (Signed)
Subjective:    Patient ID: ARHAN MCMANAMON, male    DOB: 1932/04/21, 85 y.o.   MRN: [id number]  HPI  85 year old male who  has a past medical history of Arthritis, CHEST PAIN, Chronic bronchitis ([place]), COLONIC POLYPS, HX OF, COPD (chronic obstructive pulmonary disease) ([place]), Coronary artery disease, Cystic kidney disease, GERD (gastroesophageal reflux disease), History of hiatal hernia, MI (myocardial infarction) ([place]) (09/2010), Mixed hyperlipidemia, Skin cancer, Sleep apnea, and Syncope and collapse (~ 2013; 05/24/2015).  Presents to the office today for 27-month follow-up regarding diabetes, hyperlipidemia, and kidney disease.  When he was last seen he had been without metformin for approximately 2 months due to abdominal pain, and was not convinced that his abdominal pain was due to metformin and had him restart it.  He was advised to follow-up if his abdominal discomfort returned.  He reports that he is taking Metformin daily and has not had any issues with abdominal pain  His last A1c was 7.0.   He would also like to have his B12 checked. He is currently doing B12 injections months.   He has no acute complaints, doing well overall.   Review of Systems See HPI   Past Medical History:  Diagnosis Date  . Arthritis    "right shoulder" (05/24/2015)  . CHEST PAIN   . Chronic bronchitis ([place])    "get it ~ q yr" (05/24/2015)  . COLONIC POLYPS, HX OF   . COPD (chronic obstructive pulmonary disease) ([place])    "dx'd but I don't take RX for it" (05/24/2015)  . Coronary artery disease   . Cystic kidney disease   . GERD (gastroesophageal reflux disease)   . History of hiatal hernia   . MI (myocardial infarction) ([place]) 09/2010  . Mixed hyperlipidemia   . Skin cancer    "top of my head only" (05/24/2015)  . Sleep apnea   . Syncope and collapse ~ 2013; 05/24/2015    Social History   Socioeconomic History  . Marital status: Married    Spouse name: Not on file  . Number of children:  Not on file  . Years of education: Not on file  . Highest education level: Not on file  Occupational History  . Not on file  Social Needs  . Financial resource strain: Not on file  . Food insecurity:    Worry: Not on file    Inability: Not on file  . Transportation needs:    Medical: Not on file    Non-medical: Not on file  Tobacco Use  . Smoking status: Former Smoker    Packs/day: 1.50    Years: 3.00    Pack years: 4.50    Types: Cigarettes    Last attempt to quit: 12/09/1951    Years since quitting: 66.9  . Smokeless tobacco: Never Used  Substance and Sexual Activity  . Alcohol use: Yes    Comment: "no alcohol since 1953"  . Drug use: No  . Sexual activity: Not on file  Lifestyle  . Physical activity:    Days per week: Not on file    Minutes per session: Not on file  . Stress: Not on file  Relationships  . Social connections:    Talks on phone: Not on file    Gets together: Not on file    Attends religious service: Not on file    Active member of club or organization: Not on file    Attends meetings of clubs or organizations: Not  on file    Relationship status: Not on file  . Intimate partner violence:    Fear of current or ex partner: Not on file    Emotionally abused: Not on file    Physically abused: Not on file    Forced sexual activity: Not on file  Other Topics Concern  . 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 CARDIAC CATHETERIZATION  09/2010  . CHOLECYSTECTOMY OPEN  1998  . COLECTOMY  1998   "partial"  . CORONARY ANGIOPLASTY    . CORONARY ARTERY BYPASS GRAFT  09/2010   Median sternotomy for coronary artery bypass grafting x3  (left internal mammary artery to distal left anterior  descending  coronary artery, saphenous vein graft to first diagonal branch,  saphenous vein graft to first  obtuse marginal branch, endoscopic  saphenous vein harvest from  right thigh). SURGEON:  Valentina Gu.  Roxy Manns, MD  ASSISTANT:  John Giovanni, PA-C  ANESTHESIA:  Glynda Jaeger, MD   . LEFT HEART CATH AND CORS/GRAFTS ANGIOGRAPHY N/A 08/22/2017   Procedure: LEFT HEART CATH AND CORS/GRAFTS ANGIOGRAPHY;  Surgeon: Martinique, Peter M, MD;  Location: [facility];  Service: Cardiovascular;  Laterality: N/A;  . SKIN CANCER EXCISION     "top of my head"    Family History  Problem Relation Age of Onset  . Lung cancer Sister   . Heart attack Father     Allergies  Allergen Reactions  . Levaquin [Levofloxacin In D5w] Swelling and Other (See Comments)    Eyes swollen  . Other Anaphylaxis and Other (See Comments)    Bee sting  . Metformin And Related Nausea Only    Current Outpatient Medications on File Prior to Visit  Medication Sig Dispense Refill  . aspirin 81 MG tablet Take 81 mg by mouth daily.    Marland Kitchen atorvastatin (LIPITOR) 20 MG tablet TAKE 1 TABLET (20 MG TOTAL) BY MOUTH DAILY. 90 tablet 3  . B-D 3CC LUER-LOK SYR 25GX1" 25G X 1" 3 ML MISC USE AS DIRECTED 2 each 19  . cyanocobalamin (,VITAMIN B-12,) 1000 MCG/ML injection INJECT 1ML INTRAMUSCULARLY EVERY WEEK FOR TWO WEEKS THEN 1ML ONCE A MONTH THEREAFTER (Patient taking differently: INJECT 1ML INTRAMUSCULARLY ONCE A MONTH) 13 mL 0  . diphenhydrAMINE (BENADRYL) 25 MG tablet Take 25 mg by mouth once as needed for allergies (bee sting).    . EPINEPHrine (EPIPEN 2-PAK) 0.3 mg/0.3 mL IJ SOAJ injection     . ketoconazole (NIZORAL) 2 % shampoo APPLY EVERY OTHER DAY TO SCALP--LATHER 5 MINUTES,THEN RINSE OUT 120 mL 3  . metoprolol tartrate (LOPRESSOR) 25 MG tablet TAKE 1 TABLET (25 MG TOTAL) BY MOUTH 2 (TWO) TIMES DAILY. 180 tablet 3  . nitroGLYCERIN (NITROSTAT) 0.4 MG SL tablet Place 1 tablet (0.4 mg total) under the tongue every 5 (five) minutes as needed for chest pain (3 doses max). 25 tablet 1   No current facility-administered medications on file prior to visit.     There were no vitals taken for this  visit.      Objective:   Physical Exam Vitals signs and nursing note reviewed.  Constitutional:      Appearance: Normal appearance.  Cardiovascular:     Rate and Rhythm: Normal rate and regular rhythm.     Pulses: Normal pulses.     Heart  sounds: Normal heart sounds.  Pulmonary:     Effort: Pulmonary effort is normal.     Breath sounds: Normal breath sounds.  Abdominal:     General: Abdomen is flat.     Palpations: Abdomen is soft.  Skin:    General: Skin is warm and dry.     Capillary Refill: Capillary refill takes less than 2 seconds.  Neurological:     General: No focal deficit present.     Mental Status: He is alert and oriented to person, place, and time.  Psychiatric:        Mood and Affect: Mood normal.        Behavior: Behavior normal.        Thought Content: Thought content normal.        Judgment: Judgment normal.       Assessment & Plan:  1. Type 2 diabetes mellitus with neurological complications (HCC)  - POCT A1C - 6.7 - has improved - Continue with Metformin 500 mg daily  - Basic Metabolic Panel  2. B12 deficiency  - Vitamin B12  Dorothyann Peng, NP

## 2018-11-10 DIAGNOSIS — H25812 Combined forms of age-related cataract, left eye: Secondary | ICD-10-CM | POA: Insufficient documentation

## 2018-11-11 ENCOUNTER — Other Ambulatory Visit: Payer: Self-pay | Admitting: Adult Health

## 2018-11-11 MED ORDER — "SYRINGE/NEEDLE (DISP) 25G X 1"" 3 ML MISC": 0 refills | Status: DC

## 2018-11-11 MED ORDER — CYANOCOBALAMIN 1000 MCG/ML IJ SOLN: INTRAMUSCULAR | 0 refills | Status: DC

## 2018-11-11 NOTE — Telephone Encounter (Signed)
Last B12 normal.  Ok to send?

## 2018-11-11 NOTE — Telephone Encounter (Signed)
Requested medication (s) are due for refill today -yes  Requested medication (s) are on the active medication list -yes  Future visit scheduled -yes  Last refill: syringe-06/24/18                  B12- 12/20/17  Notes to clinic: Patient is requesting Rx prescribed by providers no longer at practice. Please review syringe Rx for preference changes.  Requested Prescriptions  Pending Prescriptions Disp Refills   SYRINGE-NEEDLE, DISP, 3 ML (B-D 3CC LUER-LOK SYR 25GX1") 25G X 1" 3 ML MISC 2 each 19    Sig: See admin instructions.     There is no refill protocol information for this order     cyanocobalamin (,VITAMIN B-12,) 1000 MCG/ML injection 13 mL 0     Off-Protocol Failed - 11/11/2018  9:06 AM      Failed - Medication not assigned to a protocol, review manually.      Passed - Valid encounter within last 12 months    Recent Outpatient Visits          5 days ago Type 2 diabetes mellitus with neurological complications ([place])   Therapist, music at [employer], [place], NP   6 months ago Encounter to establish care   [facility] at [employer], [place], NP   7 months ago Type 2 diabetes mellitus with neurological complications ([place])   Therapist, music at [facility], Doretha Sou, MD   1 year ago Coronary artery disease involving native coronary artery of native heart without angina pectoris   [place] at [facility], Doretha Sou, MD   1 year ago Type 2 diabetes mellitus with neurological complications ([place])   [place] at [facility], Doretha Sou, MD      Future Appointments            In 4 months Nafziger, Tommi Rumps, NP [facility] at [place], [facility]            Requested Prescriptions  Pending Prescriptions Disp Refills   SYRINGE-NEEDLE, DISP, 3 ML (B-D 3CC LUER-LOK SYR 25GX1") 25G X 1" 3 ML MISC 2 each 19    Sig: See admin instructions.     There is no refill protocol information for this order     cyanocobalamin (,VITAMIN B-12,) 1000 MCG/ML injection 13 mL 0     Off-Protocol Failed - 11/11/2018  9:06 AM      Failed - Medication not assigned to a protocol, review manually.      Passed - Valid encounter within last 12 months    Recent Outpatient Visits          5 days ago Type 2 diabetes mellitus with neurological complications ([place])   Therapist, music at [employer], [place], NP   6 months ago Encounter to establish care   [facility] at [employer], [place], NP   7 months ago Type 2 diabetes mellitus with neurological complications ([place])   Therapist, music at [facility], Doretha Sou, MD   1 year ago Coronary artery disease involving native coronary artery of native heart without angina pectoris   [place] at [facility], Doretha Sou, MD   1 year ago Type 2 diabetes mellitus with neurological complications ([place])   [place] at [facility], Doretha Sou, MD      Future Appointments            In 4 months Nafziger, Tommi Rumps, NP [facility] at [place], [facility]

## 2018-11-11 NOTE — Telephone Encounter (Signed)
Copied from [place] [id number]. Topic: Quick Communication - Rx Refill/Question >> Nov 11, 2018  9:02 AM Andria Frames L wrote: Medication: cyanocobalamin (,VITAMIN B-12,) 1000 MCG/ML injection [[id number]] -D 3CC LUER-LOK SYR 25GX1" 25G X 1" 3 ML MISC [[id number]]    Has the patient contacted their pharmacy? no Preferred Pharmacy (with phone number or street name):[id number] - [place], [place] [phone] (Phone) [phone] (Fax)   Agent: Please be advised that RX refills may take up to 3 business days. We ask that you follow-up with your pharmacy.

## 2019-03-23 ENCOUNTER — Encounter: Payer: Self-pay | Admitting: Adult Health

## 2019-03-23 ENCOUNTER — Other Ambulatory Visit: Payer: Self-pay

## 2019-03-23 ENCOUNTER — Ambulatory Visit (INDEPENDENT_AMBULATORY_CARE_PROVIDER_SITE_OTHER): Payer: Medicare Other | Admitting: Adult Health

## 2019-03-23 VITALS — BP 120/72 | Ht 71.0 in | Wt 189.0 lb

## 2019-03-23 DIAGNOSIS — E782 Mixed hyperlipidemia: Secondary | ICD-10-CM | POA: Diagnosis not present

## 2019-03-23 DIAGNOSIS — E538 Deficiency of other specified B group vitamins: Secondary | ICD-10-CM | POA: Diagnosis not present

## 2019-03-23 DIAGNOSIS — N401 Enlarged prostate with lower urinary tract symptoms: Secondary | ICD-10-CM | POA: Diagnosis not present

## 2019-03-23 DIAGNOSIS — I2510 Atherosclerotic heart disease of native coronary artery without angina pectoris: Secondary | ICD-10-CM

## 2019-03-23 DIAGNOSIS — N138 Other obstructive and reflux uropathy: Secondary | ICD-10-CM

## 2019-03-23 DIAGNOSIS — Z0000 Encounter for general adult medical examination without abnormal findings: Secondary | ICD-10-CM

## 2019-03-23 DIAGNOSIS — E1149 Type 2 diabetes mellitus with other diabetic neurological complication: Secondary | ICD-10-CM

## 2019-03-23 LAB — COMPREHENSIVE METABOLIC PANEL
ALT: 18 U/L (ref 0–53)
AST: 18 U/L (ref 0–37)
Albumin: 4.4 g/dL (ref 3.5–5.2)
Alkaline Phosphatase: 63 U/L (ref 39–117)
BUN: 22 mg/dL (ref 6–23)
CO2: 28 mEq/L (ref 19–32)
Calcium: 9.2 mg/dL (ref 8.4–10.5)
Chloride: 103 mEq/L (ref 96–112)
Creatinine, Ser: 0.69 mg/dL (ref 0.40–1.50)
GFR: 109.05 mL/min (ref >60.00)
Glucose, Bld: 140 mg/dL — ABNORMAL HIGH (ref 70–99)
Potassium: 4.6 mEq/L (ref 3.5–5.1)
Sodium: 138 mEq/L (ref 135–145)
Total Bilirubin: 0.8 mg/dL (ref 0.2–1.2)
Total Protein: 6.5 g/dL (ref 6.0–8.3)

## 2019-03-23 LAB — CBC WITH DIFFERENTIAL/PLATELET
Basophils Absolute: 0 10*3/uL (ref 0.0–0.1)
Basophils Relative: 1.1 % (ref 0.0–3.0)
Eosinophils Absolute: 0.1 10*3/uL (ref 0.0–0.7)
Eosinophils Relative: 3.1 % (ref 0.0–5.0)
HCT: 39 % (ref 39.0–52.0)
Hemoglobin: 13.2 g/dL (ref 13.0–17.0)
Lymphocytes Relative: 36.6 % (ref 12.0–46.0)
Lymphs Abs: 1.6 10*3/uL (ref 0.7–4.0)
MCHC: 33.8 g/dL (ref 30.0–36.0)
MCV: 97.2 fl (ref 78.0–100.0)
Monocytes Absolute: 0.4 10*3/uL (ref 0.1–1.0)
Monocytes Relative: 9.4 % (ref 3.0–12.0)
Neutro Abs: 2.2 10*3/uL (ref 1.4–7.7)
Neutrophils Relative %: 49.8 % (ref 43.0–77.0)
Platelets: 140 10*3/uL — ABNORMAL LOW (ref 150.0–400.0)
RBC: 4.01 Mil/uL — ABNORMAL LOW (ref 4.22–5.81)
RDW: 13.4 % (ref 11.5–15.5)
WBC: 4.4 10*3/uL (ref 4.0–10.5)

## 2019-03-23 LAB — HEMOGLOBIN A1C: Hgb A1c MFr Bld: 7.1 % — ABNORMAL HIGH (ref 4.6–6.5)

## 2019-03-23 LAB — LIPID PANEL
Cholesterol: 133 mg/dL (ref 0–200)
HDL: 58.1 mg/dL (ref >39.00)
LDL Cholesterol: 61 mg/dL (ref 0–99)
NonHDL: 74.65
Total CHOL/HDL Ratio: 2
Triglycerides: 68 mg/dL (ref 0.0–149.0)
VLDL: 13.6 mg/dL (ref 0.0–40.0)

## 2019-03-23 LAB — VITAMIN B12: Vitamin B-12: 382 pg/mL (ref 211–911)

## 2019-03-23 NOTE — Progress Notes (Signed)
Subjective:    Patient ID: ARIV PENROD, male    DOB: 26-May-1932, 85 y.o.   MRN: [id number]  HPI Patient presents for yearly preventative medicine examination. He is a pleasant 85 year old male who  has a past medical history of Arthritis, CHEST PAIN, Chronic bronchitis ([place]), COLONIC POLYPS, HX OF, COPD (chronic obstructive pulmonary disease) ([place]), Coronary artery disease, Cystic kidney disease, GERD (gastroesophageal reflux disease), History of hiatal hernia, MI (myocardial infarction) ([place]) (09/2010), Mixed hyperlipidemia, Skin cancer, Sleep apnea, and Syncope and collapse (~ 2013; 05/24/2015).   DM -currently takes metformin 500 mg daily.  He denies nausea, vomiting, diarrhea Lab Results  Component Value Date   HGBA1C 6.7 11/06/2018    CAD s/p CABG -currently monitored by cardiology.  Is currently prescribed Lipitor 20 mg, aspirin 81 mg, and metoprolol 25 mg twice daily.  His last cath was in December 2018 which showed patent grafts no new disease.  He denies chest pain, shortness of breath, or claudication   Hyperlipidemia -currently prescribed Lipitor 20 mg and aspirin 81 mg Lab Results  Component Value Date   CHOL 128 03/19/2018   HDL 59.70 03/19/2018   LDLCALC 56 03/19/2018   TRIG 59.0 03/19/2018   CHOLHDL 2 03/19/2018   Vitamin B 12 Deficiency -receives monthly B12 injections  BPH - is followed by Urology    All immunizations and health maintenance protocols were reviewed with the patient and needed orders were placed. Vaccinations are UTD  Appropriate screening laboratory values were ordered for the patient including screening of hyperlipidemia, renal function and hepatic function.  Medication reconciliation,  past medical history, social history, problem list and allergies were reviewed in detail with the patient  Goals were established with regard to weight loss, exercise, and  diet in compliance with medications  End of life planning was discussed.  Review  of Systems  Constitutional: Negative.   HENT: Negative.   Eyes: Negative.   Respiratory: Negative.   Cardiovascular: Negative.   Gastrointestinal: Negative.   Endocrine: Negative.   Genitourinary: Negative.   Musculoskeletal: Positive for gait problem.  Skin: Negative.   Allergic/Immunologic: Negative.   Hematological: Negative.   Psychiatric/Behavioral: Negative.   All other systems reviewed and are negative.  Past Medical History:  Diagnosis Date  . Arthritis    "right shoulder" (05/24/2015)  . CHEST PAIN   . Chronic bronchitis ([place])    "get it ~ q yr" (05/24/2015)  . COLONIC POLYPS, HX OF   . COPD (chronic obstructive pulmonary disease) ([place])    "dx'd but I don't take RX for it" (05/24/2015)  . Coronary artery disease   . Cystic kidney disease   . GERD (gastroesophageal reflux disease)   . History of hiatal hernia   . MI (myocardial infarction) ([place]) 09/2010  . Mixed hyperlipidemia   . Skin cancer    "top of my head only" (05/24/2015)  . Sleep apnea   . Syncope and collapse ~ 2013; 05/24/2015    Social History   Socioeconomic History  . Marital status: Married    Spouse name: Not on file  . Number of children: Not on file  . Years of education: Not on file  . Highest education level: Not on file  Occupational History  . Not on file  Social Needs  . Financial resource strain: Not on file  . Food insecurity    Worry: Not on file    Inability: Not on file  . Transportation needs  Medical: Not on file    Non-medical: Not on file  Tobacco Use  . Smoking status: Former Smoker    Packs/day: 1.50    Years: 3.00    Pack years: 4.50    Types: Cigarettes    Quit date: 12/09/1951    Years since quitting: 67.3  . Smokeless tobacco: Never Used  Substance and Sexual Activity  . Alcohol use: Yes    Comment: "no alcohol since 1953"  . Drug use: No  . Sexual activity: Not on file  Lifestyle  . Physical activity    Days per week: Not on file    Minutes per session:  Not on file  . Stress: Not on file  Relationships  . Social Herbalist on phone: Not on file    Gets together: Not on file    Attends religious service: Not on file    Active member of club or organization: Not on file    Attends meetings of clubs or organizations: Not on file    Relationship status: Not on file  . Intimate partner violence    Fear of current or ex partner: Not on file    Emotionally abused: Not on file    Physically abused: Not on file    Forced sexual activity: Not on file  Other Topics Concern  . 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 CARDIAC CATHETERIZATION  09/2010  . CHOLECYSTECTOMY OPEN  1998  . COLECTOMY  1998   "partial"  . CORONARY ANGIOPLASTY    . CORONARY ARTERY BYPASS GRAFT  09/2010   Median sternotomy for coronary artery bypass grafting x3  (left internal mammary artery to distal left anterior  descending  coronary artery, saphenous vein graft to first diagonal branch,  saphenous vein graft to first  obtuse marginal branch, endoscopic  saphenous vein harvest from right thigh). SURGEON:  Valentina Gu.  Roxy Manns, MD  ASSISTANT:  John Giovanni, PA-C  ANESTHESIA:  Glynda Jaeger, MD   . LEFT HEART CATH AND CORS/GRAFTS ANGIOGRAPHY N/A 08/22/2017   Procedure: LEFT HEART CATH AND CORS/GRAFTS ANGIOGRAPHY;  Surgeon: Martinique, Peter M, MD;  Location: [facility];  Service: Cardiovascular;  Laterality: N/A;  . SKIN CANCER EXCISION     "top of my head"    Family History  Problem Relation Age of Onset  . Lung cancer Sister   . Heart attack Father     Allergies  Allergen Reactions  . Levaquin [Levofloxacin In D5w] Swelling and Other (See Comments)    Eyes swollen  . Other Anaphylaxis and Other (See Comments)    Bee sting  . Metformin And Related Nausea Only    Current Outpatient Medications on File Prior to Visit  Medication Sig Dispense  Refill  . aspirin 81 MG tablet Take 81 mg by mouth daily.    Marland Kitchen atorvastatin (LIPITOR) 20 MG tablet TAKE 1 TABLET (20 MG TOTAL) BY MOUTH DAILY. 90 tablet 3  . cyanocobalamin (,VITAMIN B-12,) 1000 MCG/ML injection INJECT 1ML INTRAMUSCULARLY ONCE A MONTH 12 mL 0  . EPINEPHrine (EPIPEN 2-PAK) 0.3 mg/0.3 mL IJ SOAJ injection     . ketoconazole (NIZORAL) 2 % shampoo APPLY EVERY OTHER DAY TO SCALP--LATHER 5 MINUTES,THEN RINSE OUT 120 mL 3  . metFORMIN (GLUCOPHAGE-XR) 500 MG 24 hr tablet Take 500 mg by  mouth daily with breakfast.     . metoprolol tartrate (LOPRESSOR) 25 MG tablet TAKE 1 TABLET (25 MG TOTAL) BY MOUTH 2 (TWO) TIMES DAILY. 180 tablet 3  . nitroGLYCERIN (NITROSTAT) 0.4 MG SL tablet Place 1 tablet (0.4 mg total) under the tongue every 5 (five) minutes as needed for chest pain (3 doses max). 25 tablet 1  . SYRINGE-NEEDLE, DISP, 3 ML (B-D 3CC LUER-LOK SYR 25GX1") 25G X 1" 3 ML MISC USE TO INJECT B12 ONCE MONTHLY 12 each 0   No current facility-administered medications on file prior to visit.     BP 120/72   Ht 5\' 11"  (1.803 m)   Wt 189 lb (85.7 kg)   BMI 26.36 kg/m       Objective:   Physical Exam Vitals signs and nursing note reviewed.  Constitutional:      General: He is not in acute distress.    Appearance: Normal appearance. He is not ill-appearing, toxic-appearing or diaphoretic.  HENT:     Head: Normocephalic and atraumatic.     Right Ear: Tympanic membrane, ear canal and external ear normal.     Left Ear: Tympanic membrane, ear canal and external ear normal. There is no impacted cerumen.     Nose: Nose normal. No congestion or rhinorrhea.     Mouth/Throat:     Mouth: Mucous membranes are moist.     Dentition: Abnormal dentition.     Pharynx: Oropharynx is clear. No oropharyngeal exudate.  Eyes:     General: No scleral icterus.       Right eye: No discharge.        Left eye: No discharge.     Extraocular Movements: Extraocular movements intact.      Conjunctiva/sclera: Conjunctivae normal.     Pupils: Pupils are equal, round, and reactive to light.  Neck:     Musculoskeletal: Normal range of motion and neck supple.     Thyroid: No thyromegaly.     Vascular: No carotid bruit or JVD.     Trachea: No tracheal deviation.  Cardiovascular:     Rate and Rhythm: Normal rate and regular rhythm.     Pulses: Normal pulses.     Heart sounds: Normal heart sounds. No murmur. No friction rub. No gallop.   Pulmonary:     Effort: Pulmonary effort is normal. No respiratory distress.     Breath sounds: Normal breath sounds. No stridor. No wheezing, rhonchi or rales.  Chest:     Chest wall: No tenderness.  Abdominal:     General: Bowel sounds are normal. There is no distension.     Palpations: Abdomen is soft. There is no mass.     Tenderness: There is no abdominal tenderness. There is no right CVA tenderness, left CVA tenderness, guarding or rebound.     Hernia: No hernia is present.  Musculoskeletal: Normal range of motion.        General: No tenderness or deformity.  Lymphadenopathy:     Cervical: No cervical adenopathy.  Skin:    General: Skin is warm and dry.     Capillary Refill: Capillary refill takes less than 2 seconds.     Coloration: Skin is not jaundiced or pale.     Findings: No bruising, erythema, lesion or rash.  Neurological:     General: No focal deficit present.     Mental Status: He is alert and oriented to person, place, and time. Mental status is at baseline.  Cranial Nerves: No cranial nerve deficit.     Motor: No abnormal muscle tone.     Coordination: Coordination normal.     Gait: Gait abnormal (foot drop).     Deep Tendon Reflexes: Reflexes are normal and symmetric. Reflexes normal.  Psychiatric:        Mood and Affect: Mood normal.        Behavior: Behavior normal.        Thought Content: Thought content normal.        Judgment: Judgment normal.        Assessment & Plan:  1. Routine general medical  examination at a health care facility  - CBC with Differential/Platelet - Comprehensive metabolic panel - Hemoglobin A1c - Lipid panel  2. B12 deficiency  - Vitamin B12  3. Benign prostatic hyperplasia with urinary obstruction - Follow up with urology as directed   4. Coronary artery disease involving native coronary artery of native heart without angina pectoris - Follow up with Cardiology as directed - CBC with Differential/Platelet - Comprehensive metabolic panel - Hemoglobin A1c - Lipid panel  5. Mixed hyperlipidemia - consider increase in statin  - CBC with Differential/Platelet - Comprehensive metabolic panel - Hemoglobin A1c - Lipid panel  6. Type 2 diabetes mellitus with neurological complications (HCC) - Consider increase in metformin  - likely 6 month follow up - CBC with Differential/Platelet - Comprehensive metabolic panel - Hemoglobin A1c - Lipid panel  Dorothyann Peng, NP

## 2019-04-20 ENCOUNTER — Other Ambulatory Visit: Payer: Self-pay | Admitting: Family Medicine

## 2019-05-12 ENCOUNTER — Telehealth: Payer: Self-pay

## 2019-05-12 NOTE — Telephone Encounter (Signed)
Copied from [place] [id number]. Topic: General - Other >> May 12, 2019  9:29 AM Yvette Rack wrote: Reason for CRM: Pt stated he was told that the metFORMIN (GLUCOPHAGE-XR) 500 MG 24 hr tablet had been recalled and he needs a Rx for an alternate medication. Pt requests call back.

## 2019-05-12 NOTE — Telephone Encounter (Signed)
Ok to switch to Metformin 500 mg BID

## 2019-05-13 MED ORDER — METFORMIN HCL 500 MG PO TABS: 500.0000 mg | ORAL_TABLET | Freq: Two times a day (BID) | ORAL | 0 refills | Status: DC

## 2019-05-13 NOTE — Telephone Encounter (Signed)
Spoke to the pt and informed him to pickup new rx from the pharmacy.  Instructed him to call back if he has any problems.  Nothing further needed.

## 2019-06-26 ENCOUNTER — Other Ambulatory Visit: Payer: Self-pay | Admitting: Adult Health

## 2019-09-07 ENCOUNTER — Emergency Department (HOSPITAL_COMMUNITY)
Admission: EM | Admit: 2019-09-07 | Discharge: 2019-09-07 | Disposition: A | Discharge status: Home / Self Care | Payer: Medicare Other | Attending: Emergency Medicine | Admitting: Emergency Medicine

## 2019-09-07 ENCOUNTER — Emergency Department (HOSPITAL_COMMUNITY): Payer: Medicare Other

## 2019-09-07 ENCOUNTER — Encounter (HOSPITAL_COMMUNITY): Payer: Self-pay | Admitting: *Deleted

## 2019-09-07 ENCOUNTER — Other Ambulatory Visit: Payer: Self-pay

## 2019-09-07 DIAGNOSIS — Z79899 Other long term (current) drug therapy: Secondary | ICD-10-CM | POA: Insufficient documentation

## 2019-09-07 DIAGNOSIS — Z951 Presence of aortocoronary bypass graft: Secondary | ICD-10-CM | POA: Diagnosis not present

## 2019-09-07 DIAGNOSIS — D539 Nutritional anemia, unspecified: Secondary | ICD-10-CM | POA: Diagnosis not present

## 2019-09-07 DIAGNOSIS — Z7982 Long term (current) use of aspirin: Secondary | ICD-10-CM | POA: Insufficient documentation

## 2019-09-07 DIAGNOSIS — Z7984 Long term (current) use of oral hypoglycemic drugs: Secondary | ICD-10-CM | POA: Insufficient documentation

## 2019-09-07 DIAGNOSIS — I2510 Atherosclerotic heart disease of native coronary artery without angina pectoris: Secondary | ICD-10-CM | POA: Diagnosis not present

## 2019-09-07 DIAGNOSIS — Z85820 Personal history of malignant melanoma of skin: Secondary | ICD-10-CM | POA: Diagnosis not present

## 2019-09-07 DIAGNOSIS — J449 Chronic obstructive pulmonary disease, unspecified: Secondary | ICD-10-CM | POA: Insufficient documentation

## 2019-09-07 DIAGNOSIS — R079 Chest pain, unspecified: Secondary | ICD-10-CM | POA: Insufficient documentation

## 2019-09-07 DIAGNOSIS — Z87891 Personal history of nicotine dependence: Secondary | ICD-10-CM | POA: Insufficient documentation

## 2019-09-07 LAB — BASIC METABOLIC PANEL
Anion gap: 9 (ref 5–15)
BUN: 26 mg/dL — ABNORMAL HIGH (ref 8–23)
CO2: 28 mmol/L (ref 22–32)
Calcium: 9.2 mg/dL (ref 8.9–10.3)
Chloride: 104 mmol/L (ref 98–111)
Creatinine, Ser: 0.76 mg/dL (ref 0.61–1.24)
GFR calc Af Amer: >60 mL/min (ref >60)
GFR calc non Af Amer: >60 mL/min (ref >60)
Glucose, Bld: 153 mg/dL — ABNORMAL HIGH (ref 70–99)
Potassium: 4.6 mmol/L (ref 3.5–5.1)
Sodium: 141 mmol/L (ref 135–145)

## 2019-09-07 LAB — CBC WITH DIFFERENTIAL/PLATELET
Abs Immature Granulocytes: 0.01 10*3/uL (ref 0.00–0.07)
Basophils Absolute: 0 10*3/uL (ref 0.0–0.1)
Basophils Relative: 1 %
Eosinophils Absolute: 0.2 10*3/uL (ref 0.0–0.5)
Eosinophils Relative: 3 %
HCT: 38.6 % — ABNORMAL LOW (ref 39.0–52.0)
Hemoglobin: 12.4 g/dL — ABNORMAL LOW (ref 13.0–17.0)
Immature Granulocytes: 0 %
Lymphocytes Relative: 32 %
Lymphs Abs: 1.6 10*3/uL (ref 0.7–4.0)
MCH: 32.4 pg (ref 26.0–34.0)
MCHC: 32.1 g/dL (ref 30.0–36.0)
MCV: 100.8 fL — ABNORMAL HIGH (ref 80.0–100.0)
Monocytes Absolute: 0.5 10*3/uL (ref 0.1–1.0)
Monocytes Relative: 11 %
Neutro Abs: 2.6 10*3/uL (ref 1.7–7.7)
Neutrophils Relative %: 53 %
Platelets: 130 10*3/uL — ABNORMAL LOW (ref 150–400)
RBC: 3.83 MIL/uL — ABNORMAL LOW (ref 4.22–5.81)
RDW: 12.9 % (ref 11.5–15.5)
WBC: 4.9 10*3/uL (ref 4.0–10.5)
nRBC: 0 % (ref 0.0–0.2)

## 2019-09-07 LAB — TROPONIN I (HIGH SENSITIVITY)
Troponin I (High Sensitivity): 6 ng/L (ref <18)
Troponin I (High Sensitivity): 5 ng/L (ref <18)

## 2019-09-07 NOTE — ED Notes (Signed)
Pt ambulated to [facility]

## 2019-09-07 NOTE — ED Provider Notes (Signed)
Signout from Dr. Roxanne Mins.  85 year old male with known coronary disease here after having chest pain at 4 AM this morning.  Pain resolved after 2 nitro.  Plan is to follow-up on troponin if 2 troponins are negative and patient remains asymptomatic can be discharged for outpatient follow-up. Physical Exam  BP 130/64 (BP Location: Right Arm)   Pulse (!) 55   Temp 97.6 F (36.4 C) (Oral)   Resp 18   Ht 5\' 11"  (1.803 m)   Wt 83.5 kg   SpO2 98%   BMI 25.66 kg/m   Physical Exam  ED Course/Procedures     Procedures  MDM  Delta troponin unchanged. Will discharge.        Hayden Rasmussen, MD 09/07/19 [phone]

## 2019-09-07 NOTE — ED Triage Notes (Signed)
Pt brought in by [facility] for c/o chest pain; pt woke up around 0430 this am and started having chest pain; pt administered 2 nitro SL and asa and pt told ems it relieved his pain

## 2019-09-07 NOTE — Discharge Instructions (Addendum)
You were seen in the emergency department for chest pain.  You had blood work chest x-ray and an EKG that did not show any serious findings.  Please contact your primary care doctor and your cardiologist for close follow-up.  Continue your regular medications.  Return to the emergency department if any concerning symptoms.

## 2019-09-07 NOTE — ED Notes (Signed)
Pt was pain free at discharge

## 2019-09-07 NOTE — ED Provider Notes (Signed)
[facility] EMERGENCY DEPARTMENT Provider Note   CSN: [id number] Arrival date & time: 09/07/19  0551   History Chief Complaint  Patient presents with  . Chest Pain    Michael Johnston is a 85 y.o. male.  The history is provided by the patient.  Chest Pain He has history of coronary artery disease status post coronary artery bypass and was brought in by ambulance because of an episode of chest pain.  He woke up at about 4:30 AM and noted pain in his chest on the left side without radiation.  Pain was rated at 7/10.  He took 2 nitroglycerin and his daughter gave him 3 aspirin tablets and pain went away after a total of about 20 minutes.  He has been pain-free since then.  There was some minimal dyspnea and nausea but no diaphoresis.  He is not sure if the pain was what woke him up.  When present, nothing seemed to make the pain better and nothing made it worse.  Past Medical History:  Diagnosis Date  . Arthritis    "right shoulder" (05/24/2015)  . CHEST PAIN   . Chronic bronchitis ([place])    "get it ~ q yr" (05/24/2015)  . COLONIC POLYPS, HX OF   . COPD (chronic obstructive pulmonary disease) ([place])    "dx'd but I don't take RX for it" (05/24/2015)  . Coronary artery disease   . Cystic kidney disease   . GERD (gastroesophageal reflux disease)   . History of hiatal hernia   . MI (myocardial infarction) ([place]) 09/2010  . Mixed hyperlipidemia   . Skin cancer    "top of my head only" (05/24/2015)  . Sleep apnea   . Syncope and collapse ~ 2013; 05/24/2015    Patient Active Problem List   Diagnosis Date Noted  . Urine stream spraying 02/23/2018  . Flank pain 02/22/2018  . Chest pain 08/21/2017  . Unstable angina ([place]) 08/21/2017  . Renal cyst 12/20/2016  . B12 deficiency 11/08/2016  . Type 2 diabetes mellitus with neurological complications ([place]) 123XX123  . Syncope and collapse 05/24/2015  . Benign prostatic hyperplasia with urinary obstruction 12/12/2014  . Carotid artery  stenosis 05/22/2012  . CAD (coronary artery disease) 05/14/2012  . ED (erectile dysfunction) of organic origin 08/19/2011  . FH: CABG (coronary artery bypass surgery) 01/28/2011  . Mixed hyperlipidemia 10/23/2010  . GERD (gastroesophageal reflux disease) 10/22/2010  . History of colonic polyps 06/14/2010    Past Surgical History:  Procedure Laterality Date  . CARDIAC CATHETERIZATION  09/2010  . CHOLECYSTECTOMY OPEN  1998  . COLECTOMY  1998   "partial"  . CORONARY ANGIOPLASTY    . CORONARY ARTERY BYPASS GRAFT  09/2010   Median sternotomy for coronary artery bypass grafting x3  (left internal mammary artery to distal left anterior  descending  coronary artery, saphenous vein graft to first diagonal branch,  saphenous vein graft to first  obtuse marginal branch, endoscopic  saphenous vein harvest from right thigh). SURGEON:  Valentina Gu.  Roxy Manns, MD  ASSISTANT:  John Giovanni, PA-C  ANESTHESIA:  Glynda Jaeger, MD   . LEFT HEART CATH AND CORS/GRAFTS ANGIOGRAPHY N/A 08/22/2017   Procedure: LEFT HEART CATH AND CORS/GRAFTS ANGIOGRAPHY;  Surgeon: Martinique, Peter M, MD;  Location: [facility];  Service: Cardiovascular;  Laterality: N/A;  . SKIN CANCER EXCISION     "top of my head"       Family History  Problem Relation Age of Onset  .  Lung cancer Sister   . Heart attack Father     Social History   Tobacco Use  . Smoking status: Former Smoker    Packs/day: 1.50    Years: 3.00    Pack years: 4.50    Types: Cigarettes    Quit date: 12/09/1951    Years since quitting: 67.7  . Smokeless tobacco: Never Used  Substance Use Topics  . Alcohol use: Yes    Comment: "no alcohol since 1953"  . Drug use: No    Home Medications Prior to Admission medications   Medication Sig Start Date End Date Taking? Authorizing Provider  aspirin 81 MG tablet Take 81 mg by mouth daily.    [provider]  atorvastatin (LIPITOR) 20 MG tablet TAKE 1 TABLET (20 MG TOTAL) BY MOUTH DAILY. 10/05/18    Nafziger, Tommi Rumps, NP  cyanocobalamin (,VITAMIN B-12,) 1000 MCG/ML injection INJECT 1ML INTRAMUSCULARLY ONCE A MONTH 11/11/18   Nafziger, Tommi Rumps, NP  EPINEPHrine (EPIPEN 2-PAK) 0.3 mg/0.3 mL IJ SOAJ injection  03/06/18   [provider]  ketoconazole (NIZORAL) 2 % shampoo APPLY EVERY OTHER DAY TO SCALP--LATHER 5 MINUTES,THEN RINSE OUT 04/20/19   Nafziger, Tommi Rumps, NP  metFORMIN (GLUCOPHAGE) 500 MG tablet TAKE 1 TABLET (500 MG TOTAL) BY MOUTH 2 (TWO) TIMES DAILY WITH A MEAL. 06/29/19   Nafziger, Tommi Rumps, NP  metoprolol tartrate (LOPRESSOR) 25 MG tablet TAKE 1 TABLET (25 MG TOTAL) BY MOUTH 2 (TWO) TIMES DAILY. 10/05/18   Nafziger, Tommi Rumps, NP  nitroGLYCERIN (NITROSTAT) 0.4 MG SL tablet Place 1 tablet (0.4 mg total) under the tongue every 5 (five) minutes as needed for chest pain (3 doses max). 01/31/16   Josue Hector, MD  SYRINGE-NEEDLE, DISP, 3 ML (B-D 3CC LUER-LOK SYR 25GX1") 25G X 1" 3 ML MISC USE TO INJECT B12 ONCE MONTHLY 11/11/18   Nafziger, Tommi Rumps, NP    Allergies    Levaquin [levofloxacin in d5w], Other, and Metformin and related  Review of Systems   Review of Systems  Cardiovascular: Positive for chest pain.  All other systems reviewed and are negative.   Physical Exam Updated Vital Signs BP 130/64 (BP Location: Right Arm)   Pulse (!) 55   Temp 97.6 F (36.4 C) (Oral)   Resp 18   Ht 5\' 11"  (1.803 m)   Wt 83.5 kg   SpO2 98%   BMI 25.66 kg/m   Physical Exam Vitals and nursing note reviewed.   85 year old male, resting comfortably and in no acute distress. Vital signs are significant for slow heart rate. Oxygen saturation is 98%, which is normal. Head is normocephalic and atraumatic. PERRLA, EOMI. Oropharynx is clear. Neck is nontender and supple without adenopathy or JVD. Back is nontender and there is no CVA tenderness. Lungs are clear without rales, wheezes, or rhonchi. Chest is nontender.  Sternotomy scar present and well-healed. Heart has regular rate and rhythm without  murmur. Abdomen is soft, flat, nontender without masses or hepatosplenomegaly and peristalsis is normoactive. Extremities have no cyanosis or edema, full range of motion is present. Skin is warm and dry without rash. Neurologic: Mental status is normal, cranial nerves are intact, there are no motor or sensory deficits.  ED Results / Procedures / Treatments   Labs (all labs ordered are listed, but only abnormal results are displayed) Labs Reviewed  CBC WITH DIFFERENTIAL/PLATELET  BASIC METABOLIC PANEL  TROPONIN I (HIGH SENSITIVITY)    EKG EKG Interpretation  Date/Time:  Tuesday September 07 2019 06:02:40 EST Ventricular  Rate:  54 PR Interval:    QRS Duration: 116 QT Interval:  383 QTC Calculation: 363 R Axis:   -44 Text Interpretation: sinus rhythm Nonspecific IVCD with LAD diffuse t wave flattening new from 1/19 Confirmed by Aletta Edouard [phone]) on 09/07/2019 6:57:31 AM   Radiology No results found.  Procedures Procedures   Medications Ordered in ED Medications - No data to display  ED Course  I have reviewed the triage vital signs and the nursing notes.  Pertinent labs & imaging results that were available during my care of the patient were reviewed by me and considered in my medical decision making (see chart for details).  MDM Rules/Calculators/A&P Chest pain which probably was an episode of angina.  Old records are reviewed confirming history of coronary artery bypass.  Last catheterization was in December 2018 and showed two-vessel occlusive disease with patent grafts, no myocardium at risk.  Cardiac work-up has been initiated including troponin x2.  ECG shows nonspecific T wave flattening. Initial labs show a mild macrocytic anemia, troponin still pending. He continues to be pain-free in the ED. Case is signed out to Dr.Butler.  Final Clinical Impression(s) / ED Diagnoses Final diagnoses:  Chest pain, unspecified type  Macrocytic anemia    Rx / DC  Orders ED Discharge Orders    None       Delora Fuel, MD [id number] [phone]

## 2019-09-07 NOTE — ED Notes (Signed)
Hospitalist at bedside . Pt in bed, tongue out of mouth . Responds to pain. Informed pt given ativan at0509. New orders placed

## 2019-09-17 ENCOUNTER — Other Ambulatory Visit: Payer: Self-pay

## 2019-09-17 ENCOUNTER — Ambulatory Visit (INDEPENDENT_AMBULATORY_CARE_PROVIDER_SITE_OTHER): Payer: Medicare Other | Admitting: Adult Health

## 2019-09-17 ENCOUNTER — Encounter: Payer: Self-pay | Admitting: Adult Health

## 2019-09-17 VITALS — BP 120/70 | HR 58 | Temp 97.7°F | Ht 71.0 in | Wt 186.0 lb

## 2019-09-17 DIAGNOSIS — K2100 Gastro-esophageal reflux disease with esophagitis, without bleeding: Secondary | ICD-10-CM

## 2019-09-17 DIAGNOSIS — R079 Chest pain, unspecified: Secondary | ICD-10-CM | POA: Diagnosis not present

## 2019-09-17 DIAGNOSIS — E1149 Type 2 diabetes mellitus with other diabetic neurological complication: Secondary | ICD-10-CM | POA: Diagnosis not present

## 2019-09-17 LAB — HEMOGLOBIN A1C: Hgb A1c MFr Bld: 7 % — ABNORMAL HIGH (ref 4.6–6.5)

## 2019-09-17 MED ORDER — OMEPRAZOLE 20 MG PO CPDR: 20.0000 mg | DELAYED_RELEASE_CAPSULE | Freq: Every day | ORAL | 3 refills | Status: DC

## 2019-09-17 NOTE — Progress Notes (Signed)
Subjective:    Patient ID: Michael Johnston, male    DOB: 1932/05/25, 86 y.o.   MRN: [id number]  HPI 86 year old male who  has a past medical history of Arthritis, CHEST PAIN, Chronic bronchitis ([place]), COLONIC POLYPS, HX OF, COPD (chronic obstructive pulmonary disease) ([place]), Coronary artery disease, Cystic kidney disease, GERD (gastroesophageal reflux disease), History of hiatal hernia, MI (myocardial infarction) ([place]) (09/2010), Mixed hyperlipidemia, Skin cancer, Sleep apnea, and Syncope and collapse (~ 2013; 05/24/2015).  He presents to the office today for follow-up after an ER visit on 09/07/2019.  He has known coronary artery disease and woke up around 4:30 AM that morning and noted to have pain in the left side of his chest without radiation.  He rated his pain at a 7 out of 10.  He took 2 nitroglycerin and his daughter gave him 3 aspirin tablets and after about 20 minutes the pain went away completely.  EMS was called and brought him to the emergency room.  He reports that there was some mild dyspnea and nausea but no diaphoresis.  His cardiac work-up was unremarkable.  Denies any recurrent chest pain or dyspnea.  Has an upcoming appointment with cardiology week.   He also reports that he has also been having "more gas than I am used to".  He also endorses a sour taste sometimes in the back of his throat.  He denies abdominal pain, nausea, vomiting, diarrhea, or waking up with a sour taste in his mouth.  He has tried taking over-the-counter anti-gas medications without resolution.   He is also due for an A1c check.  Is currently maintained on metformin 500 mg daily.  He denies hypoglycemic episodes.  His A1c has been very well controlled over the past and he is seen every 6 months  Lab Results  Component Value Date   HGBA1C 7.1 (H) 03/23/2019     Review of Systems See HPI  Past Medical History:  Diagnosis Date  . Arthritis    "right shoulder" (05/24/2015)  . CHEST PAIN   .  Chronic bronchitis ([place])    "get it ~ q yr" (05/24/2015)  . COLONIC POLYPS, HX OF   . COPD (chronic obstructive pulmonary disease) ([place])    "dx'd but I don't take RX for it" (05/24/2015)  . Coronary artery disease   . Cystic kidney disease   . GERD (gastroesophageal reflux disease)   . History of hiatal hernia   . MI (myocardial infarction) ([place]) 09/2010  . Mixed hyperlipidemia   . Skin cancer    "top of my head only" (05/24/2015)  . Sleep apnea   . Syncope and collapse ~ 2013; 05/24/2015    Social History   Socioeconomic History  . Marital status: Married    Spouse name: Not on file  . Number of children: Not on file  . Years of education: Not on file  . Highest education level: Not on file  Occupational History  . Not on file  Tobacco Use  . Smoking status: Former Smoker    Packs/day: 1.50    Years: 3.00    Pack years: 4.50    Types: Cigarettes    Quit date: 12/09/1951    Years since quitting: 67.8  . Smokeless tobacco: Never Used  Substance and Sexual Activity  . Alcohol use: Yes    Comment: "no alcohol since 1953"  . Drug use: No  . Sexual activity: Not on file  Other Topics Concern  . 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 Difficulty of Paying Living Expenses: Not on file  Food Insecurity:   . Worried About Charity fundraiser in the Last Year: Not on file  . Ran Out of Food in the Last Year: Not on file  Transportation Needs:   . Lack of Transportation (Medical): Not on file  . Lack of Transportation (Non-Medical): Not on file  Physical Activity:   . Days of Exercise per Week: Not on file  . Minutes of Exercise per Session: Not on file  Stress:   . Feeling of Stress : Not on file  Social Connections:   . Frequency of Communication with Friends and Family: Not on file  . Frequency of Social Gatherings with Friends and Family: Not on file  .  Attends Religious Services: Not on file  . Active Member of Clubs or Organizations: Not on file  . Attends Archivist Meetings: Not on file  . Marital Status: Not on file  Intimate Partner Violence:   . Fear of Current or Ex-Partner: Not on file  . Emotionally Abused: Not on file  . Physically Abused: Not on file  . Sexually Abused: Not on file    Past Surgical History:  Procedure Laterality Date  . CARDIAC CATHETERIZATION  09/2010  . CHOLECYSTECTOMY OPEN  1998  . COLECTOMY  1998   "partial"  . CORONARY ANGIOPLASTY    . CORONARY ARTERY BYPASS GRAFT  09/2010   Median sternotomy for coronary artery bypass grafting x3  (left internal mammary artery to distal left anterior  descending  coronary artery, saphenous vein graft to first diagonal branch,  saphenous vein graft to first  obtuse marginal branch, endoscopic  saphenous vein harvest from right thigh). SURGEON:  Valentina Gu.  Roxy Manns, MD  ASSISTANT:  John Giovanni, PA-C  ANESTHESIA:  Glynda Jaeger, MD   . LEFT HEART CATH AND CORS/GRAFTS ANGIOGRAPHY N/A 08/22/2017   Procedure: LEFT HEART CATH AND CORS/GRAFTS ANGIOGRAPHY;  Surgeon: Martinique, Peter M, MD;  Location: [facility];  Service: Cardiovascular;  Laterality: N/A;  . SKIN CANCER EXCISION     "top of my head"    Family History  Problem Relation Age of Onset  . Lung cancer Sister   . Heart attack Father     Allergies  Allergen Reactions  . Levaquin [Levofloxacin In D5w] Swelling and Other (See Comments)    Eyes swollen  . Other Anaphylaxis and Other (See Comments)    Bee sting  . Metformin And Related Nausea Only    Current Outpatient Medications on File Prior to Visit  Medication Sig Dispense Refill  . aspirin 81 MG tablet Take 81 mg by mouth daily.    Marland Kitchen atorvastatin (LIPITOR) 20 MG tablet TAKE 1 TABLET (20 MG TOTAL) BY MOUTH DAILY. 90 tablet 3  . cyanocobalamin (,VITAMIN B-12,) 1000 MCG/ML injection INJECT 1ML INTRAMUSCULARLY ONCE A MONTH 12 mL 0  .  EPINEPHrine (EPIPEN 2-PAK) 0.3 mg/0.3 mL IJ SOAJ injection     . ketoconazole (NIZORAL) 2 % shampoo APPLY EVERY OTHER DAY TO SCALP--LATHER 5 MINUTES,THEN RINSE OUT 120 mL 3  . metFORMIN (GLUCOPHAGE) 500 MG tablet TAKE 1 TABLET (500 MG TOTAL) BY MOUTH 2 (TWO) TIMES DAILY WITH A MEAL. (Patient taking differently: Take 500 mg by mouth daily with breakfast. )  180 tablet 0  . metoprolol tartrate (LOPRESSOR) 25 MG tablet TAKE 1 TABLET (25 MG TOTAL) BY MOUTH 2 (TWO) TIMES DAILY. 180 tablet 3  . metoprolol tartrate (LOPRESSOR) 25 mg/10 mL SUSP     . nitroGLYCERIN (NITROSTAT) 0.4 MG SL tablet Place 1 tablet (0.4 mg total) under the tongue every 5 (five) minutes as needed for chest pain (3 doses max). 25 tablet 1  . SYRINGE-NEEDLE, DISP, 3 ML (B-D 3CC LUER-LOK SYR 25GX1") 25G X 1" 3 ML MISC USE TO INJECT B12 ONCE MONTHLY 12 each 0   No current facility-administered medications on file prior to visit.    BP 120/70   Pulse (!) 58   Temp 97.7 F (36.5 C) (Other (Comment))   Ht 5\' 11"  (1.803 m)   Wt 186 lb (84.4 kg)   SpO2 99%   BMI 25.94 kg/m       Objective:   Physical Exam Vitals and nursing note reviewed.  Cardiovascular:     Rate and Rhythm: Normal rate and regular rhythm.     Pulses: Normal pulses.     Heart sounds: Normal heart sounds.  Pulmonary:     Effort: Pulmonary effort is normal.     Breath sounds: Normal breath sounds.  Abdominal:     General: Abdomen is flat. Bowel sounds are normal.     Palpations: Abdomen is soft.     Tenderness: There is no abdominal tenderness.  Neurological:     General: No focal deficit present.     Mental Status: He is oriented to person, place, and time.  Psychiatric:        Mood and Affect: Mood normal.        Behavior: Behavior normal.        Thought Content: Thought content normal.        Judgment: Judgment normal.       Assessment & Plan:  1. Chest pain, unspecified type -Has been pain-free since emergency room visit.  Work-up was  unrevealing.  He was advised to follow-up with cardiology as directed  2. Type 2 diabetes mellitus with neurological complications (HCC)  - Hemoglobin A1c  3. Gastroesophageal reflux disease with esophagitis without hemorrhage -We will start on Prilosec 20 mg daily. - omeprazole (PRILOSEC) 20 MG capsule; Take 1 capsule (20 mg total) by mouth daily.  Dispense: 30 capsule; Refill: 3  Dorothyann Peng, NP

## 2019-09-22 ENCOUNTER — Ambulatory Visit: Payer: Medicare Other | Admitting: Cardiology

## 2019-10-03 ENCOUNTER — Other Ambulatory Visit: Payer: Self-pay | Admitting: Adult Health

## 2019-10-07 ENCOUNTER — Other Ambulatory Visit: Payer: Self-pay

## 2019-10-07 MED ORDER — ATORVASTATIN CALCIUM 20 MG PO TABS: ORAL_TABLET | ORAL | 3 refills | Status: DC

## 2019-10-07 NOTE — Progress Notes (Signed)
Patient ID: Michael Johnston, male   DOB: 21-Feb-1932, 86 y.o.   MRN: [id number]    Michael Johnston is seen for F/U CAD SEMI after getting injections at the dentist office September 27, 2010. Subsequently found to have 3VD needing CABG: LIMA to LAD, SVG to D1, SVG to OM  RCA was not grafted    Admitted 08/21/17 for chest pain  R/O CXR NAD no acute ECG changes Cath done by Dr Martinique 08/22/17 patent Grafts normal EF and EDP RCA with no obstructive Disease   Carotid 12/28/15 plaque no stenosis due for f/u Last TTE 05/25/15 EF 55-60% no valve disease   Seen in ER 09/07/19 with chest pain. Woke him up at 4;30 am 7/10 took 2 nitro and ASA pain lasted 20 minutes had more GERD and gas as well In ER no acute ECG changes and negative troponin x 2 Started on Prilosec by primary 09/17/19   He has not had recurrent episode since ER visit. Not taking prilosec regularly [place] prior to ER visit And still has some right sided soreness   ROS: Denies fever, malais, weight loss, blurry vision, decreased visual acuity, cough, sputum, SOB, hemoptysis, pleuritic pain, palpitaitons, heartburn, abdominal pain, melena, lower extremity edema, claudication, or rash.  All other systems reviewed and negative  General: BP 120/76   Pulse 65   Ht 5\' 11"  (1.803 m)   Wt 190 lb (86.2 kg)   SpO2 98%   BMI 26.50 kg/m  Affect appropriate Healthy:  appears stated age 73: normal Neck supple with no adenopathy JVP normal no bruits no thyromegaly Lungs clear with no wheezing and good diaphragmatic motion Heart:  S1/S2 no murmur, no rub, gallop or click PMI normal post sternotomy  Abdomen: benighn, BS positve, no tenderness, no AAA no bruit.  No HSM or HJR Distal pulses intact with no bruits No edema Neuro non-focal Skin warm and dry No muscular weakness Left radial cath sight A     Current Outpatient Medications  Medication Sig Dispense Refill  . aspirin 81 MG tablet Take 81 mg by mouth daily.    Marland Kitchen atorvastatin (LIPITOR) 20  MG tablet TAKE 1 TABLET (20 MG TOTAL) BY MOUTH DAILY. 90 tablet 3  . cyanocobalamin (,VITAMIN B-12,) 1000 MCG/ML injection INJECT 1ML INTRAMUSCULARLY ONCE A MONTH 12 mL 0  . EPINEPHrine (EPIPEN 2-PAK) 0.3 mg/0.3 mL IJ SOAJ injection     . ketoconazole (NIZORAL) 2 % shampoo APPLY EVERY OTHER DAY TO SCALP--LATHER 5 MINUTES,THEN RINSE OUT 120 mL 3  . metFORMIN (GLUCOPHAGE) 500 MG tablet Take 1 tablet (500 mg total) by mouth daily with breakfast. 180 tablet 0  . metoprolol tartrate (LOPRESSOR) 25 MG tablet TAKE 1 TABLET (25 MG TOTAL) BY MOUTH 2 (TWO) TIMES DAILY. 180 tablet 3  . metoprolol tartrate (LOPRESSOR) 25 mg/10 mL SUSP     . nitroGLYCERIN (NITROSTAT) 0.4 MG SL tablet Place 1 tablet (0.4 mg total) under the tongue every 5 (five) minutes as needed for chest pain (3 doses max). 25 tablet 1  . omeprazole (PRILOSEC) 20 MG capsule Take 1 capsule (20 mg total) by mouth daily. 30 capsule 3  . SYRINGE-NEEDLE, DISP, 3 ML (B-D 3CC LUER-LOK SYR 25GX1") 25G X 1" 3 ML MISC USE TO INJECT B12 ONCE MONTHLY 12 each 0   No current facility-administered medications for this visit.    Allergies  Levaquin [levofloxacin in d5w], Other, and Metformin and related  Electrocardiogram:   01/26/14  SR rate 54 normal  01/26/15  SR rate 64  LAD LVH no significant change  10/16/16  SR ate 83 PAC LVH nonspecific ST changes   Assessment and Plan  CAD:  CABG 2012  Cath 08/22/17 patent grafts no new disease in RCA normal EF continue medical Rx Given recent ER visit And family concern will order lexiscan myovue since its been over 2 years since his coronaries have been evaluated Discussed  Not taking too many nitro back to back as this appears to have dropped his BP  HTN:  Well controlled.  Continue current medications and low sodium Dash type diet.    Cholesterol is at goal.  Continue current dose of statin and diet Rx.  No myalgias or side effects.  F/U  LFT's in 6 months. Lab Results  Component Value Date   LDLCALC 61  03/23/2019   DM:  Discussed low carb diet.  Target hemoglobin A1c is 6.5 or less.  Continue current medications.  Carotid:  Duplex 12/28/15 plaque no stenosis given age observe   GERD:  Improved with prilosec f/u primary          F/U with me in 3 months if myovue low risk   [name]

## 2019-10-13 ENCOUNTER — Encounter: Payer: Self-pay | Admitting: *Deleted

## 2019-10-13 ENCOUNTER — Other Ambulatory Visit: Payer: Self-pay

## 2019-10-13 ENCOUNTER — Ambulatory Visit: Payer: Medicare Other | Admitting: Cardiovascular Disease

## 2019-10-13 ENCOUNTER — Encounter: Payer: Self-pay | Admitting: Cardiovascular Disease

## 2019-10-13 VITALS — BP 120/76 | HR 65 | Ht 71.0 in | Wt 190.0 lb

## 2019-10-13 DIAGNOSIS — Z951 Presence of aortocoronary bypass graft: Secondary | ICD-10-CM | POA: Diagnosis not present

## 2019-10-13 DIAGNOSIS — I209 Angina pectoris, unspecified: Secondary | ICD-10-CM | POA: Diagnosis not present

## 2019-10-13 NOTE — Patient Instructions (Addendum)
Your physician recommends that you continue on your current medications as directed. Please refer to the Current Medication list given to you today.  Your physician has requested that you have a lexiscan myoview. For further information please visit [email or web address]. Please follow instruction sheet, as given.   Your physician recommends that you schedule a follow-up appointment in: Michael Johnston Cancel

## 2019-10-20 ENCOUNTER — Ambulatory Visit: Payer: Medicare Other | Admitting: Cardiovascular Disease

## 2019-10-25 DIAGNOSIS — L853 Xerosis cutis: Secondary | ICD-10-CM | POA: Diagnosis not present

## 2019-10-25 DIAGNOSIS — L820 Inflamed seborrheic keratosis: Secondary | ICD-10-CM | POA: Diagnosis not present

## 2019-10-25 DIAGNOSIS — L570 Actinic keratosis: Secondary | ICD-10-CM | POA: Diagnosis not present

## 2019-10-27 ENCOUNTER — Telehealth (HOSPITAL_COMMUNITY): Payer: Self-pay | Admitting: Radiology

## 2019-10-27 NOTE — Telephone Encounter (Signed)
Patient given detailed instructions per Myocardial Perfusion Study Information Sheet for the test on 11/01/19 at 1100. Patient notified to arrive 15 minutes early and that it is imperative to arrive on time for appointment to keep from having the test rescheduled.  If you need to cancel or reschedule your appointment, please call the office within 24 hours of your appointment. . Patient verbalized understanding.

## 2019-10-28 ENCOUNTER — Encounter (HOSPITAL_COMMUNITY): Payer: Medicare Other

## 2019-11-01 ENCOUNTER — Other Ambulatory Visit: Payer: Self-pay

## 2019-11-01 ENCOUNTER — Ambulatory Visit (HOSPITAL_COMMUNITY): Payer: Medicare Other | Attending: Cardiovascular Disease

## 2019-11-01 DIAGNOSIS — I209 Angina pectoris, unspecified: Secondary | ICD-10-CM | POA: Diagnosis not present

## 2019-11-01 DIAGNOSIS — Z951 Presence of aortocoronary bypass graft: Secondary | ICD-10-CM

## 2019-11-01 LAB — MYOCARDIAL PERFUSION IMAGING
LV dias vol: 96 mL (ref 62–150)
LV sys vol: 36 mL
Peak HR: 74 {beats}/min
Rest HR: 58 {beats}/min
SDS: 2
SRS: 0
SSS: 2
TID: 1.21

## 2019-11-01 MED ORDER — REGADENOSON 0.4 MG/5ML IV SOLN
0.4000 mg | Freq: Once | INTRAVENOUS | Status: AC
  Administered 2019-11-01: 0.4 mg via INTRAVENOUS

## 2019-11-01 MED ORDER — TECHNETIUM TC 99M TETROFOSMIN IV KIT
32.4000 | PACK | Freq: Once | INTRAVENOUS | Status: AC | PRN
  Administered 2019-11-01: 32.4 via INTRAVENOUS
  Filled 2019-11-01: qty 33

## 2019-11-01 MED ORDER — TECHNETIUM TC 99M TETROFOSMIN IV KIT
10.1000 | PACK | Freq: Once | INTRAVENOUS | Status: AC | PRN
  Administered 2019-11-01: 10.1 via INTRAVENOUS
  Filled 2019-11-01: qty 11

## 2019-11-15 DIAGNOSIS — L853 Xerosis cutis: Secondary | ICD-10-CM | POA: Diagnosis not present

## 2019-12-10 ENCOUNTER — Other Ambulatory Visit: Payer: Self-pay | Admitting: Adult Health

## 2019-12-13 NOTE — Telephone Encounter (Signed)
Sent to the pharmacy by e-scribe. 

## 2020-01-05 ENCOUNTER — Other Ambulatory Visit: Payer: Self-pay | Admitting: Adult Health

## 2020-01-05 NOTE — Telephone Encounter (Signed)
Sent to the pharmacy by e-scribe. 

## 2020-01-17 NOTE — Progress Notes (Signed)
Patient ID: LOUI ROLNICK, male   DOB: 08/30/1932, 86 y.o.   MRN: [id number]    [name] is seen for F/U CAD SEMI after getting injections at the dentist office September 27, 2010. Subsequently found to have 3VD needing CABG: LIMA to LAD, SVG to D1, SVG to OM  RCA was not grafted    Admitted 08/21/17 for chest pain  R/O CXR NAD no acute ECG changes Cath done by Dr Martinique 08/22/17 patent Grafts normal EF and EDP RCA with no obstructive Disease   Carotid 12/28/15 plaque no stenosis due for f/u Last TTE 05/25/15 EF 55-60% no valve disease   Seen in ER 09/07/19 with chest pain. Woke him up at 4;30 am 7/10 took 2 nitro and ASA pain lasted 20 minutes had more GERD and gas as well In ER no acute ECG changes and negative troponin x 2 Started on Prilosec by primary 09/17/19   Myovue 11/01/19 normal non ischemic EF 60% continue on medical Rx  Some gas no further chest pain. His wife sees Dr Hochrein/Allred for palpitations He is an avid reader of the Bible Does not believe in COVID vaccine and won't get it  ROS: Denies fever, malais, weight loss, blurry vision, decreased visual acuity, cough, sputum, SOB, hemoptysis, pleuritic pain, palpitaitons, heartburn, abdominal pain, melena, lower extremity edema, claudication, or rash.  All other systems reviewed and negative  General: BP (!) 118/56   Pulse 64   Ht 5\' 11"  (1.803 m)   Wt 183 lb (83 kg)   SpO2 99%   BMI 25.52 kg/m  Affect appropriate Healthy:  appears stated age 21: normal Neck supple with no adenopathy JVP normal no bruits no thyromegaly Lungs clear with no wheezing and good diaphragmatic motion Heart:  S1/S2 no murmur, no rub, gallop or click PMI normal post sternotomy  Abdomen: benighn, BS positve, no tenderness, no AAA no bruit.  No HSM or HJR Distal pulses intact with no bruits No edema Neuro non-focal Skin warm and dry No muscular weakness Left radial cath sight A     Current Outpatient Medications  Medication Sig  Dispense Refill  . aspirin 81 MG tablet Take 81 mg by mouth daily.    Marland Kitchen atorvastatin (LIPITOR) 20 MG tablet TAKE 1 TABLET (20 MG TOTAL) BY MOUTH DAILY. 90 tablet 3  . cyanocobalamin (,VITAMIN B-12,) 1000 MCG/ML injection INJECT 1ML INTRAMUSCULARLY ONCE A MONTH 3 mL 0  . EPINEPHrine (EPIPEN 2-PAK) 0.3 mg/0.3 mL IJ SOAJ injection     . ketoconazole (NIZORAL) 2 % shampoo APPLY EVERY OTHER DAY TO SCALP--LATHER 5 MINUTES,THEN RINSE OUT 120 mL 3  . metFORMIN (GLUCOPHAGE) 500 MG tablet Take 1 tablet (500 mg total) by mouth daily with breakfast. 180 tablet 0  . metoprolol tartrate (LOPRESSOR) 25 MG tablet TAKE 1 TABLET BY MOUTH TWICE DAILY 180 tablet 0  . nitroGLYCERIN (NITROSTAT) 0.4 MG SL tablet Place 1 tablet (0.4 mg total) under the tongue every 5 (five) minutes as needed for chest pain (3 doses max). 25 tablet 1  . omeprazole (PRILOSEC) 20 MG capsule Take 1 capsule (20 mg total) by mouth daily. 30 capsule 3  . SYRINGE-NEEDLE, DISP, 3 ML (B-D 3CC LUER-LOK SYR 25GX1") 25G X 1" 3 ML MISC USE TO INJECT B12 ONCE MONTHLY 12 each 0   No current facility-administered medications for this visit.    Allergies  Levaquin [levofloxacin in d5w], Other, and Metformin and related  Electrocardiogram:   01/26/14  SR rate 54 normal  01/26/15  SR rate 64  LAD LVH no significant change  10/16/16  SR ate 3 PAC LVH nonspecific ST changes   Assessment and Plan  CAD:  CABG 2012  Cath 08/22/17 patent grafts no new disease in RCA normal EF  Normal non ischemic myovue 11/01/19 EF 60% continue medical Rx   HTN:  Well controlled.  Continue current medications and low sodium Dash type diet.    Cholesterol is at goal.  Continue current dose of statin and diet Rx.  No myalgias or side effects.  F/U  LFT's in 6 months. Lab Results  Component Value Date   LDLCALC 61 03/23/2019   DM:  Discussed low carb diet.  Target hemoglobin A1c is 6.5 or less.  Continue current medications.  Carotid:  Duplex 12/28/15 plaque no stenosis  given age observe   GERD:  Improved with prilosec f/u primary          F/U with me in a year   [name]

## 2020-01-18 ENCOUNTER — Telehealth: Payer: Self-pay | Admitting: Adult Health

## 2020-01-18 NOTE — Chronic Care Management (AMB) (Signed)
  Chronic Care Management   Outreach Note  01/18/2020 Name: MATHYUS FACENDA MRN: [id number] DOB: 08/02/1932  Referred by: Dorothyann Peng, NP Reason for referral : Chronic Care Management   An unsuccessful telephone outreach was attempted today. The patient was referred to the pharmacist for assistance with care management and care coordination.   Follow Up Plan:   [place]

## 2020-01-26 ENCOUNTER — Other Ambulatory Visit: Payer: Self-pay

## 2020-01-26 ENCOUNTER — Ambulatory Visit: Payer: Medicare Other | Admitting: Cardiovascular Disease

## 2020-01-26 ENCOUNTER — Encounter: Payer: Self-pay | Admitting: Cardiovascular Disease

## 2020-01-26 VITALS — BP 118/56 | HR 64 | Ht 71.0 in | Wt 183.0 lb

## 2020-01-26 DIAGNOSIS — Z951 Presence of aortocoronary bypass graft: Secondary | ICD-10-CM | POA: Diagnosis not present

## 2020-01-26 NOTE — Patient Instructions (Signed)

## 2020-02-02 ENCOUNTER — Other Ambulatory Visit: Payer: Self-pay | Admitting: Adult Health

## 2020-02-02 ENCOUNTER — Telehealth: Payer: Self-pay | Admitting: Adult Health

## 2020-02-02 DIAGNOSIS — K2100 Gastro-esophageal reflux disease with esophagitis, without bleeding: Secondary | ICD-10-CM

## 2020-02-02 NOTE — Progress Notes (Signed)
  Chronic Care Management   Note  02/02/2020 Name: Michael Johnston MRN: [id number] DOB: April 07, 1932  Michael Johnston is a 86 y.o. year old male who is a primary care patient of Dorothyann Peng, NP. I reached out to [name] by phone today in response to a referral sent by [name] PCP, Dorothyann Peng, NP.   [name] was given information about Chronic Care Management services today including:  1. CCM service includes personalized support from designated clinical staff supervised by his physician, including individualized plan of care and coordination with other care providers 2. 24/7 contact phone numbers for assistance for urgent and routine care needs. 3. Service will only be billed when office clinical staff spend 20 minutes or more in a month to coordinate care. 4. Only one practitioner may furnish and bill the service in a calendar month. 5. The patient may stop CCM services at any time (effective at the end of the month) by phone call to the office staff.   Patient agreed to services and verbal consent obtained.   This note is not being shared with the patient for the following reason: To respect privacy (The patient or proxy has requested that the information not be shared).  Follow up plan:  [place]

## 2020-02-14 DIAGNOSIS — B353 Tinea pedis: Secondary | ICD-10-CM | POA: Diagnosis not present

## 2020-02-14 DIAGNOSIS — D229 Melanocytic nevi, unspecified: Secondary | ICD-10-CM | POA: Diagnosis not present

## 2020-02-14 DIAGNOSIS — L814 Other melanin hyperpigmentation: Secondary | ICD-10-CM | POA: Diagnosis not present

## 2020-02-14 DIAGNOSIS — L570 Actinic keratosis: Secondary | ICD-10-CM | POA: Diagnosis not present

## 2020-02-14 DIAGNOSIS — D485 Neoplasm of uncertain behavior of skin: Secondary | ICD-10-CM | POA: Diagnosis not present

## 2020-02-14 DIAGNOSIS — L820 Inflamed seborrheic keratosis: Secondary | ICD-10-CM | POA: Diagnosis not present

## 2020-02-14 DIAGNOSIS — L821 Other seborrheic keratosis: Secondary | ICD-10-CM | POA: Diagnosis not present

## 2020-02-14 DIAGNOSIS — L905 Scar conditions and fibrosis of skin: Secondary | ICD-10-CM | POA: Diagnosis not present

## 2020-03-07 ENCOUNTER — Other Ambulatory Visit: Payer: Self-pay | Admitting: Adult Health

## 2020-03-07 DIAGNOSIS — L570 Actinic keratosis: Secondary | ICD-10-CM | POA: Diagnosis not present

## 2020-03-07 DIAGNOSIS — L244 Irritant contact dermatitis due to drugs in contact with skin: Secondary | ICD-10-CM | POA: Diagnosis not present

## 2020-03-09 NOTE — Telephone Encounter (Signed)
[facility] for 90 days but needs to schedule CPE, is due after July 14th

## 2020-03-09 NOTE — Telephone Encounter (Signed)
SENT TO THE PHARMACY BY E-SCRIBE. 

## 2020-03-13 ENCOUNTER — Other Ambulatory Visit: Payer: Self-pay | Admitting: Adult Health

## 2020-03-14 ENCOUNTER — Other Ambulatory Visit: Payer: Self-pay | Admitting: Adult Health

## 2020-03-14 DIAGNOSIS — N138 Other obstructive and reflux uropathy: Secondary | ICD-10-CM

## 2020-03-14 DIAGNOSIS — E782 Mixed hyperlipidemia: Secondary | ICD-10-CM

## 2020-03-14 DIAGNOSIS — I2510 Atherosclerotic heart disease of native coronary artery without angina pectoris: Secondary | ICD-10-CM

## 2020-03-14 NOTE — Progress Notes (Signed)
Referral for CCM

## 2020-03-14 NOTE — Telephone Encounter (Signed)
[facility] SENT IN.  OTHER PRESCRIPTIONS WERE SENT IN ON 03/09/2020.

## 2020-03-14 NOTE — Chronic Care Management (AMB) (Signed)
Chronic Care Management Pharmacy  Name: Michael Johnston  MRN: [id number] DOB: [birth year]  Chief Complaint/ HPI  [name],  86 y.o. , male presents for their Initial CCM visit with the clinical pharmacist via telephone due to COVID-19 Pandemic. Patient is living with his wife and they have been married for 65 years. They have 6 kids in total, 4 boys and 2 girls. He is a retired Company secretary for 40 years. He likes to go gardening and fishing from time to time and he says that he tries to stay active. He reports not having a routine exercise, but he tries to walk around at least a mile daily.He likes to eat meat in general and tries to stay healthy by eating more fish and vegetables. He says that he doesn't use salt as much when he prepares his food. He says that he needs a refill for his metformin and for some reason the clinic is not writing it for him.  PCP : Dorothyann Peng, NP  Their chronic conditions include: CAD, GERD, DM II, HLD, Vit B12 deficiency, CABG  Office Visits: 09/17/19 OV - hospitalization f/u. No chest pain since ER visit. Followed by cardiology. Started on omeprazole 20 mg daily for GERD.   Consult Visit: 01/26/20 OV - (Nishan, Cardio) - CABG f/u. HTN well controlled. Cholesterol at goal. A1c goal is < 6.5%. No changes with meds. F/u in a year.  Medications: Outpatient Encounter Medications as of 03/16/2020  Medication Sig  . aspirin 81 MG tablet Take 81 mg by mouth daily.  Marland Kitchen atorvastatin (LIPITOR) 20 MG tablet TAKE 1 TABLET (20 MG TOTAL) BY MOUTH DAILY.  . cyanocobalamin (,VITAMIN B-12,) 1000 MCG/ML injection INJECT 1ML INTRAMUSCULARLY ONCE A MONTH.  **DUE FOR YEARLY PHYSICAL**  . EPINEPHrine (EPIPEN 2-PAK) 0.3 mg/0.3 mL IJ SOAJ injection   . ketoconazole (NIZORAL) 2 % shampoo APPLY EVERY OTHER DAY TO SCALP--LATHER 5 MINUTES,THEN RINSE OUT  . metFORMIN (GLUCOPHAGE) 500 MG tablet Take 1 tablet (500 mg total) by mouth daily with breakfast.  . metoprolol tartrate  (LOPRESSOR) 25 MG tablet Take 1 tablet (25 mg total) by mouth 2 (two) times daily. **DUE FOR YEARLY PHYSICAL**  . nitroGLYCERIN (NITROSTAT) 0.4 MG SL tablet Place 1 tablet (0.4 mg total) under the tongue every 5 (five) minutes as needed for chest pain (3 doses max).  . SYRINGE-NEEDLE, DISP, 3 ML (B-D 3CC LUER-LOK SYR 25GX1") 25G X 1" 3 ML MISC USE AS DIRECTED  . omeprazole (PRILOSEC) 20 MG capsule TAKE 1 CAPSULE BY MOUTH EVERY DAY (Patient not taking: Reported on 03/16/2020)  . [DISCONTINUED] SYRINGE-NEEDLE, DISP, 3 ML (B-D 3CC LUER-LOK SYR 25GX1") 25G X 1" 3 ML MISC USE TO INJECT B12 ONCE MONTHLY   No facility-administered encounter medications on file as of 03/16/2020.     Transportation Needs: No Transportation Needs  . Lack of Transportation (Medical): No  . Lack of Transportation (Non-Medical): No     Physical Activity: Inactive  . Days of Exercise per Week: 0 days  . Minutes of Exercise per Session: 0 min    Current Diagnosis/Assessment:  Goals Addressed            This Visit's Progress   . Chronic Care Management       CARE PLAN ENTRY  Current Barriers:  . Chronic Disease Management support, education, and care coordination needs related to Hypertension, Hyperlipidemia, and Diabetes   Hypertension BP Readings from Last 3 Encounters:  01/26/20 (!) 118/56  10/13/19 120/76  09/17/19 120/70   . Pharmacist Clinical Goal(s): o Over the next 180 days, patient will work with PharmD and providers to maintain BP goal <140/90 . Current regimen:  . Metoprolol tartrate 25 mg 1 tablet twice daily . Nitrostat 0.4 mg SL 1 tablet under tongue every 5 mins PRN for chest pain. 3 doses max. . Interventions: o Discussed low salt diet and exercising as tolerated extensively . Patient self care activities - Over the next 180 days, patient will: o Check BP once a week, document, and provide at future appointments o Ensure daily salt intake < 2300 mg/day  Hyperlipidemia Lab Results   Component Value Date/Time   LDLCALC 61 03/23/2019 10:10 AM   . Pharmacist Clinical Goal(s): o Over the next 180 days, patient will work with PharmD and providers to maintain LDL goal < 100 . Current regimen:  o Atorvastatin 20 mg 1 tablet daily . Interventions: o Discussed low cholesterol diet and exercising as tolerated extensively . Patient self care activities - Over the next 180 days, patient will: o Continue control with diet and increase exercise as tolerated  Diabetes Lab Results  Component Value Date/Time   HGBA1C 7.0 (H) 09/17/2019 09:18 AM   HGBA1C 7.1 (H) 03/23/2019 10:10 AM   HGBA1C 6.7 11/06/2018 09:25 AM   . Pharmacist Clinical Goal(s): o Over the next 180 days, patient will work with PharmD and providers to maintain A1c goal <7% . Current regimen:  o Metformin 500 mg 1 tablet daily with breakfast . Interventions: o Discussed carbohydrate counting and exercising as tolerated extensively . Patient self care activities - Over the next 180 days, patient will: o Check blood sugar in the morning before eating or drinking, document, and provide at future appointments o Contact provider with any episodes of hypoglycemia  Medication management . Pharmacist Clinical Goal(s): o Over the next 180 days, patient will work with PharmD and providers to maintain optimal medication adherence . Current pharmacy: [facility] . Interventions o Comprehensive medication review performed. o Continue current medication management strategy . Patient self care activities - Over the next 180 days, patient will: o Focus on medication adherence by using weekly pill organizers o Take medications as prescribed o Report any questions or concerns to PharmD and/or provider(s)  Initial goal documentation        Hypertension   Office blood pressures are  BP Readings from Last 3 Encounters:  01/26/20 (!) 118/56  10/13/19 120/76  09/17/19 120/70   Patient has failed these meds in  the past: None Patient is currently controlled on the following medications:  Marland Kitchen Metoprolol tartrate 25 mg 1 tablet twice daily AM and PM . Nitrostat 0.4 mg SL 1 tablet under tongue every 5 mins PRN for chest pain. 3 doses max.  Patient checks BP at home when feeling symptomatic  BP is stable and well controlled on metoprolol 25 mg twice daily. Patient reports not having routine exercise, but tries to walk as much when he is home. The last time he has taken his nitrostat was when he had chest pain due to reflux. Patient reports limiting salt when preparing food.  Plan  Continue current medications and control diet    Mixed Hyperlipidemia   Lipid Panel     Component Value Date/Time   CHOL 133 03/23/2019 1010   TRIG 68.0 03/23/2019 1010   HDL 58.10 03/23/2019 1010   CHOLHDL 2 03/23/2019 1010   VLDL 13.6 03/23/2019 1010   LDLCALC 61 03/23/2019 1010  The ASCVD Risk score Mikey Bussing DC Jr., et al., 2013) failed to calculate for the following reasons:   The 2013 ASCVD risk score is only valid for ages 92 to 59   Patient has failed these meds in past: simvastatin  Patient is currently controlled on the following medications:  . Atorvastatin 20 mg 1 tablet daily at bedtime  Lipid panel is stable, but he needs new lipid panel to reassess current status. Patient reports taking atorvastatin daily at bedtime and denies and myalgias or side effects with it. Patient reports eating meat in moderation. We discussed diet and exercise extensively.   Plan  Continue current medications and control diet   Diabetes   Recent Relevant Labs: Lab Results  Component Value Date/Time   HGBA1C 7.0 (H) 09/17/2019 09:18 AM   HGBA1C 7.1 (H) 03/23/2019 10:10 AM   HGBA1C 6.7 11/06/2018 09:25 AM   GFR 109.05 03/23/2019 10:10 AM   GFR 100.68 11/06/2018 09:43 AM   MICROALBUR <0.7 03/19/2018 08:51 AM     Checking BG: Rarely  Recent FBG Readings: 107  Patient has failed these meds in past:  None Patient is currently controlled on the following medications:  Marland Kitchen Metformin 500 mg 1 tablet daily with breakfast  A1c is within goal, but borderline. We discussed with diet and exercise extensively. Patient requested refill for metformin since he is going to run out soon. Patient denies drinking sodas and sweet teas and only drink black coffee and water. Patient reports eating carbohydrates in moderation.  Last diabetic Eye exam:  Lab Results  Component Value Date/Time   HMDIABEYEEXA No Retinopathy 07/27/2018 09:10 AM    Last diabetic Foot exam: No results found for: HMDIABFOOTEX   Plan  Continue current medications and control diet   GERD   Patient has failed these meds in past: None Patient is currently controlled on the following medications:  . Omeprazole 20 mg 1 capsule daily  Patient reports stopped taking omeprazole due to its side effects. Counseled patient on alternatives when he get episodes of acid reflux such as Tums or famotidine. Patient states to remove omeprazole on his active med list since he will no longer take it. Patient denies any more symptoms of reflux or heartburn.  Plan  Continue current medications    Vitamin B12  Deficiency   Lab Results  Component Value Date/Time   VITAMINB12 382 03/23/2019 10:10 AM  ] Patient has failed these meds in past: None Patient is currently controlled on the following medications:  Marland Kitchen Vitamin B12 1000 mcg/ml inject 1 ml once a month  Vitamin B12 level is normal. Patient reports daughter in law coming to the house once a month to do his shot.   Plan  Continue current medications    OTCs/Health Maintenance   Patient is currently controlled on the following medications: . Aspirin 81 mg 1 tablet daily . Epipen 0.3mg /0.3 ml inject as needed for anaphylaxis . Ketoconazole 2% shampoo apply every other day to scalp - lather for 5 minutes then rinse out  Plan  Continue current medications    Vaccines    Reviewed and discussed patient's vaccination history.    Immunization History  Administered Date(s) Administered  . Influenza Split 07/05/2011, 05/22/2012, 07/28/2013  . Influenza Whole 06/14/2010  . Influenza, High Dose Seasonal PF 06/25/2014, 06/03/2016, 06/02/2017, 07/02/2018  . Influenza,inj,Quad PF,6+ Mos 07/02/2018  . Influenza-Unspecified 05/18/2015, 06/02/2017  . Pneumococcal Conjugate-13 09/14/2013  . Pneumococcal Polysaccharide-23 09/09/2005, 07/05/2011  . Td 09/10/2003  . Tetanus 09/14/2013  Plan  Recommended patient receive shingrix vaccine in the pharmacy/office.    Medication Management   Pharmacy/Benefits: [facility] / [employer] Adherence: Spouse helps him with his medications Pt endorses 100% compliance  Patient has been compliant with his medications and [facility]. Introduced to him the option to on board with [facility], but he states that he is happy with the service he gets with his pharmacy now.  Plan  Continue current medication management strategy   Follow up: 6 month phone visit   Geraldine Contras, PharmD Clinical Pharmacist [facility] [phone]

## 2020-03-16 ENCOUNTER — Ambulatory Visit: Payer: Medicare Other | Admitting: Pharmacist

## 2020-03-16 ENCOUNTER — Other Ambulatory Visit: Payer: Self-pay

## 2020-03-16 ENCOUNTER — Other Ambulatory Visit: Payer: Self-pay | Admitting: Adult Health

## 2020-03-16 DIAGNOSIS — E1149 Type 2 diabetes mellitus with other diabetic neurological complication: Secondary | ICD-10-CM

## 2020-03-16 DIAGNOSIS — E782 Mixed hyperlipidemia: Secondary | ICD-10-CM

## 2020-03-16 MED ORDER — METFORMIN HCL 500 MG PO TABS: 500.0000 mg | ORAL_TABLET | Freq: Every day | ORAL | 0 refills | Status: DC

## 2020-03-16 NOTE — Progress Notes (Signed)
Thanks! I'll give him a phone call now and let him know to schedule a visit with you. I appreciate it!

## 2020-03-16 NOTE — Patient Instructions (Addendum)
Visit Information  Thank you for meeting with me to discuss your medications! I look forward to working with you to achieve your health care goals. Below is a summary of what we talked about during the visit:  Goals Addressed            This Visit's Progress   . Chronic Care Management       CARE PLAN ENTRY  Current Barriers:  . Chronic Disease Management support, education, and care coordination needs related to Hypertension, Hyperlipidemia, and Diabetes   Hypertension BP Readings from Last 3 Encounters:  01/26/20 (!) 118/56  10/13/19 120/76  09/17/19 120/70   . Pharmacist Clinical Goal(s): o Over the next 180 days, patient will work with PharmD and providers to maintain BP goal <140/90 . Current regimen:  . Metoprolol tartrate 25 mg 1 tablet twice daily . Nitrostat 0.4 mg SL 1 tablet under tongue every 5 mins PRN for chest pain. 3 doses max. . Interventions: o Discussed low salt diet and exercising as tolerated extensively . Patient self care activities - Over the next 180 days, patient will: o Check BP once a week, document, and provide at future appointments o Ensure daily salt intake < 2300 mg/day  Hyperlipidemia Lab Results  Component Value Date/Time   LDLCALC 61 03/23/2019 10:10 AM   . Pharmacist Clinical Goal(s): o Over the next 180 days, patient will work with PharmD and providers to maintain LDL goal < 100 . Current regimen:  o Atorvastatin 20 mg 1 tablet daily . Interventions: o Discussed low cholesterol diet and exercising as tolerated extensively . Patient self care activities - Over the next 180 days, patient will: o Continue control with diet and increase exercise as tolerated  Diabetes Lab Results  Component Value Date/Time   HGBA1C 7.0 (H) 09/17/2019 09:18 AM   HGBA1C 7.1 (H) 03/23/2019 10:10 AM   HGBA1C 6.7 11/06/2018 09:25 AM   . Pharmacist Clinical Goal(s): o Over the next 180 days, patient will work with PharmD and providers to maintain A1c  goal <7% . Current regimen:  o Metformin 500 mg 1 tablet daily with breakfast . Interventions: o Discussed carbohydrate counting and exercising as tolerated extensively . Patient self care activities - Over the next 180 days, patient will: o Check blood sugar in the morning before eating or drinking, document, and provide at future appointments o Contact provider with any episodes of hypoglycemia  Medication management . Pharmacist Clinical Goal(s): o Over the next 180 days, patient will work with PharmD and providers to maintain optimal medication adherence . Current pharmacy: [facility] . Interventions o Comprehensive medication review performed. o Continue current medication management strategy . Patient self care activities - Over the next 180 days, patient will: o Focus on medication adherence by using weekly pill organizers o Take medications as prescribed o Report any questions or concerns to PharmD and/or provider(s)  Initial goal documentation        Michael Johnston was given information about Chronic Care Management services today including:  1. CCM service includes personalized support from designated clinical staff supervised by his physician, including individualized plan of care and coordination with other care providers 2. 24/7 contact phone numbers for assistance for urgent and routine care needs. 3. Standard insurance, coinsurance, copays and deductibles apply for chronic care management only during months in which we provide at least 20 minutes of these services. Most insurances cover these services at 100%, however patients may be responsible for any copay, coinsurance and/or  deductible if applicable. This service may help you avoid the need for more expensive face-to-face services. 4. Only one practitioner may furnish and bill the service in a calendar month. 5. The patient may stop CCM services at any time (effective at the end of the month) by phone call to the  office staff.  Patient agreed to services and verbal consent obtained.   The patient verbalized understanding of instructions provided today and agreed to receive a mailed copy of patient instruction and/or educational materials. Telephone follow up appointment with pharmacy team member scheduled for: 09/21/20 at 8:30 AM . Geraldine Contras, PharmD Clinical Pharmacist [facility] [phone]   Cholesterol Content in Foods Cholesterol is a waxy, fat-like substance that helps to carry fat in the blood. The body needs cholesterol in small amounts, but too much cholesterol can cause damage to the arteries and heart. Most people should eat less than 200 milligrams (mg) of cholesterol a day. Foods with cholesterol  Cholesterol is found in animal-based foods, such as meat, seafood, and dairy. Generally, low-fat dairy and lean meats have less cholesterol than full-fat dairy and fatty meats. The milligrams of cholesterol per serving (mg per serving) of common cholesterol-containing foods are listed below. Meat and other proteins  Egg -- one large whole egg has 186 mg.  Veal shank -- 4 oz has 141 mg.  Lean ground [place] (93% lean) -- 4 oz has 118 mg.  Fat-trimmed lamb loin -- 4 oz has 106 mg.  Lean ground beef (90% lean) -- 4 oz has 100 mg.  Lobster -- 3.5 oz has 90 mg.  Pork loin chops -- 4 oz has 86 mg.  Canned salmon -- 3.5 oz has 83 mg.  Fat-trimmed beef top loin -- 4 oz has 78 mg.  Frankfurter -- 1 frank (3.5 oz) has 77 mg.  Crab -- 3.5 oz has 71 mg.  Roasted chicken without skin, white meat -- 4 oz has 66 mg.  Light bologna -- 2 oz has 45 mg.  Deli-cut [place] -- 2 oz has 31 mg.  Canned tuna -- 3.5 oz has 31 mg.  Berniece Salines -- 1 oz has 29 mg.  Oysters and mussels (raw) -- 3.5 oz has 25 mg.  Mackerel -- 1 oz has 22 mg.  Trout -- 1 oz has 20 mg.  Pork sausage -- 1 link (1 oz) has 17 mg.  Salmon -- 1 oz has 16 mg.  Tilapia -- 1 oz has 14  mg. Dairy  Soft-serve ice cream --  cup (4 oz) has 103 mg.  Whole-milk yogurt -- 1 cup (8 oz) has 29 mg.  Cheddar cheese -- 1 oz has 28 mg.  American cheese -- 1 oz has 28 mg.  Whole milk -- 1 cup (8 oz) has 23 mg.  2% milk -- 1 cup (8 oz) has 18 mg.  Cream cheese -- 1 tablespoon (Tbsp) has 15 mg.  Cottage cheese --  cup (4 oz) has 14 mg.  Low-fat (1%) milk -- 1 cup (8 oz) has 10 mg.  Sour cream -- 1 Tbsp has 8.5 mg.  Low-fat yogurt -- 1 cup (8 oz) has 8 mg.  Nonfat Greek yogurt -- 1 cup (8 oz) has 7 mg.  Half-and-half cream -- 1 Tbsp has 5 mg. Fats and oils  Cod liver oil -- 1 tablespoon (Tbsp) has 82 mg.  Butter -- 1 Tbsp has 15 mg.  Lard -- 1 Tbsp has 14 mg.  Bacon grease --  1 Tbsp has 14 mg.  Mayonnaise -- 1 Tbsp has 5-10 mg.  Margarine -- 1 Tbsp has 3-10 mg. Exact amounts of cholesterol in these foods may vary depending on specific ingredients and brands. Foods without cholesterol Most plant-based foods do not have cholesterol unless you combine them with a food that has cholesterol. Foods without cholesterol include:  Grains and cereals.  Vegetables.  Fruits.  Vegetable oils, such as olive, canola, and sunflower oil.  Legumes, such as peas, beans, and lentils.  Nuts and seeds.  Egg whites. Summary  The body needs cholesterol in small amounts, but too much cholesterol can cause damage to the arteries and heart.  Most people should eat less than 200 milligrams (mg) of cholesterol a day. This information is not intended to replace advice given to you by your health care provider. Make sure you discuss any questions you have with your health care provider. Document Revised: 08/08/2017 Document Reviewed: 04/22/2017 Elsevier Patient Education  [place].

## 2020-05-28 ENCOUNTER — Other Ambulatory Visit: Payer: Self-pay | Admitting: Adult Health

## 2020-06-03 ENCOUNTER — Other Ambulatory Visit: Payer: Self-pay | Admitting: Adult Health

## 2020-06-13 ENCOUNTER — Ambulatory Visit: Payer: Medicare Other | Admitting: Adult Health

## 2020-06-20 ENCOUNTER — Other Ambulatory Visit: Payer: Self-pay

## 2020-06-20 ENCOUNTER — Ambulatory Visit (INDEPENDENT_AMBULATORY_CARE_PROVIDER_SITE_OTHER): Payer: Medicare Other | Admitting: Adult Health

## 2020-06-20 ENCOUNTER — Encounter: Payer: Self-pay | Admitting: Adult Health

## 2020-06-20 VITALS — BP 128/60 | HR 59 | Temp 97.6°F | Ht 71.0 in | Wt 182.3 lb

## 2020-06-20 DIAGNOSIS — Z23 Encounter for immunization: Secondary | ICD-10-CM | POA: Diagnosis not present

## 2020-06-20 DIAGNOSIS — E1149 Type 2 diabetes mellitus with other diabetic neurological complication: Secondary | ICD-10-CM | POA: Diagnosis not present

## 2020-06-20 DIAGNOSIS — E538 Deficiency of other specified B group vitamins: Secondary | ICD-10-CM | POA: Diagnosis not present

## 2020-06-20 LAB — POCT GLYCOSYLATED HEMOGLOBIN (HGB A1C): Hemoglobin A1C: 6.5 % — AB (ref 4.0–5.6)

## 2020-06-20 LAB — VITAMIN B12: Vitamin B-12: 412 pg/mL (ref 200–1100)

## 2020-06-20 MED ORDER — METFORMIN HCL 500 MG PO TABS: 500.0000 mg | ORAL_TABLET | Freq: Every day | ORAL | 0 refills | Status: DC

## 2020-06-20 MED ORDER — "BD LUER-LOK SYRINGE 25G X 1"" 3 ML MISC": 3 refills | Status: DC

## 2020-06-20 MED ORDER — CYANOCOBALAMIN 1000 MCG/ML IJ SOLN: INTRAMUSCULAR | 3 refills | Status: DC

## 2020-06-20 NOTE — Addendum Note (Signed)
Addended by: Agnes Lawrence on: 06/20/2020 09:49 AM   Modules accepted: Orders

## 2020-06-20 NOTE — Patient Instructions (Signed)
It was great seeing you today   Your A1c has improved to 6.5 ! We will keep you on the current dose of Metformin   I will follow up with you regarding your B12 level   I have sent in your B12 injections as well

## 2020-06-20 NOTE — Progress Notes (Signed)
Subjective:    Patient ID: Michael Johnston, male    DOB: 08-25-1932, 86 y.o.   MRN: [id number]  HPI 86 year old male who  has a past medical history of Arthritis, CHEST PAIN, Chronic bronchitis ([place]), COLONIC POLYPS, HX OF, COPD (chronic obstructive pulmonary disease) ([place]), Coronary artery disease, Cystic kidney disease, GERD (gastroesophageal reflux disease), History of hiatal hernia, MI (myocardial infarction) ([place]) (09/2010), Mixed hyperlipidemia, Skin cancer, Sleep apnea, and Syncope and collapse (~ 2013; 05/24/2015).   He presents to the office today for follow up regarding DM. He is currently maintained on Metformin 500 mg daily with breakfast. He denies any side effect of Metformin. He denies episodes of hypoglycemia. He does not check his blood sugar on a regular basis. He stays very active and eats a heart healthy diet.   Lab Results  Component Value Date   HGBA1C 7.0 (H) 09/17/2019   Wt Readings from Last 3 Encounters:  06/20/20 182 lb 4.8 oz (82.7 kg)  01/26/20 183 lb (83 kg)  11/01/19 190 lb (86.2 kg)   He would like to have his B12 checked today. He is out of B12 injections. Reports that he last had an injection at the beginning of this month.   Review of Systems See HPI   Past Medical History:  Diagnosis Date  . Arthritis    "right shoulder" (05/24/2015)  . CHEST PAIN   . Chronic bronchitis ([place])    "get it ~ q yr" (05/24/2015)  . COLONIC POLYPS, HX OF   . COPD (chronic obstructive pulmonary disease) ([place])    "dx'd but I don't take RX for it" (05/24/2015)  . Coronary artery disease   . Cystic kidney disease   . GERD (gastroesophageal reflux disease)   . History of hiatal hernia   . MI (myocardial infarction) ([place]) 09/2010  . Mixed hyperlipidemia   . Skin cancer    "top of my head only" (05/24/2015)  . Sleep apnea   . Syncope and collapse ~ 2013; 05/24/2015    Social History   Socioeconomic History  . Marital status: Married    Spouse name: Not on file  .  Number of children: Not on file  . Years of education: Not on file  . Highest education level: Not on file  Occupational History  . Not on file  Tobacco Use  . Smoking status: Former Smoker    Packs/day: 1.50    Years: 3.00    Pack years: 4.50    Types: Cigarettes    Quit date: 12/09/1951    Years since quitting: 68.5  . Smokeless tobacco: Never Used  Vaping Use  . Vaping Use: Never used  Substance and Sexual Activity  . Alcohol use: Yes    Comment: "no alcohol since 1953"  . Drug use: No  . Sexual activity: Not on file  Other Topics Concern  . 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 Difficulty of Paying Living Expenses: Not on file  Food Insecurity:   . Worried About Charity fundraiser in the Last Year: Not on file  . Ran Out of Food in the Last Year: Not on file  Transportation Needs: No Transportation Needs  . Lack of Transportation (Medical): No  . Lack of Transportation (Non-Medical): No  Physical Activity: Inactive  . Days of  Exercise per Week: 0 days  . Minutes of Exercise per Session: 0 min  Stress:   . Feeling of Stress : Not on file  Social Connections:   . Frequency of Communication with Friends and Family: Not on file  . Frequency of Social Gatherings with Friends and Family: Not on file  . Attends Religious Services: Not on file  . Active Member of Clubs or Organizations: Not on file  . Attends Archivist Meetings: Not on file  . Marital Status: Not on file  Intimate Partner Violence:   . Fear of Current or Ex-Partner: Not on file  . Emotionally Abused: Not on file  . Physically Abused: Not on file  . Sexually Abused: Not on file    Past Surgical History:  Procedure Laterality Date  . CARDIAC CATHETERIZATION  09/2010  . CHOLECYSTECTOMY OPEN  1998  . COLECTOMY  1998   "partial"  . CORONARY ANGIOPLASTY    . CORONARY ARTERY  BYPASS GRAFT  09/2010   Median sternotomy for coronary artery bypass grafting x3  (left internal mammary artery to distal left anterior  descending  coronary artery, saphenous vein graft to first diagonal branch,  saphenous vein graft to first  obtuse marginal branch, endoscopic  saphenous vein harvest from right thigh). SURGEON:  Valentina Gu.  Roxy Manns, MD  ASSISTANT:  John Giovanni, PA-C  ANESTHESIA:  Glynda Jaeger, MD   . LEFT HEART CATH AND CORS/GRAFTS ANGIOGRAPHY N/A 08/22/2017   Procedure: LEFT HEART CATH AND CORS/GRAFTS ANGIOGRAPHY;  Surgeon: Martinique, Peter M, MD;  Location: [facility];  Service: Cardiovascular;  Laterality: N/A;  . SKIN CANCER EXCISION     "top of my head"    Family History  Problem Relation Age of Onset  . Lung cancer Sister   . Heart attack Father     Allergies  Allergen Reactions  . Levaquin [Levofloxacin In D5w] Swelling and Other (See Comments)    Eyes swollen  . Other Anaphylaxis and Other (See Comments)    Bee sting  . Metformin And Related Nausea Only    Current Outpatient Medications on File Prior to Visit  Medication Sig Dispense Refill  . aspirin 81 MG tablet Take 81 mg by mouth daily.    Marland Kitchen atorvastatin (LIPITOR) 20 MG tablet TAKE 1 TABLET (20 MG TOTAL) BY MOUTH DAILY. 90 tablet 3  . cyanocobalamin (,VITAMIN B-12,) 1000 MCG/ML injection INJECT 1ML INTRAMUSCULARLY ONCE A MONTH.  **DUE FOR YEARLY PHYSICAL** 3 mL 0  . EPINEPHrine (EPIPEN 2-PAK) 0.3 mg/0.3 mL IJ SOAJ injection     . ketoconazole (NIZORAL) 2 % shampoo APPLY EVERY OTHER DAY TO SCALP--LATHER 5 MINUTES,THEN RINSE OUT 120 mL 3  . metFORMIN (GLUCOPHAGE) 500 MG tablet Take 1 tablet (500 mg total) by mouth daily with breakfast. 90 tablet 0  . metoprolol tartrate (LOPRESSOR) 25 MG tablet TAKE 1 TABLET (25 MG TOTAL) BY MOUTH 2 (TWO) TIMES DAILY. **DUE FOR YEARLY PHYSICAL** 180 tablet 0  . nitroGLYCERIN (NITROSTAT) 0.4 MG SL tablet Place 1 tablet (0.4 mg total) under the tongue every 5 (five)  minutes as needed for chest pain (3 doses max). 25 tablet 1  . omeprazole (PRILOSEC) 20 MG capsule TAKE 1 CAPSULE BY MOUTH EVERY DAY 90 capsule 1  . SYRINGE-NEEDLE, DISP, 3 ML (B-D 3CC LUER-LOK SYR 25GX1") 25G X 1" 3 ML MISC USE AS DIRECTED 3 each 0   No current facility-administered medications on file prior to visit.    BP  128/60 (BP Location: Left Arm, Patient Position: Sitting, Cuff Size: Normal)   Pulse (!) 59   Temp 97.6 F (36.4 C) (Oral)   Ht 5\' 11"  (1.803 m)   Wt 182 lb 4.8 oz (82.7 kg)   BMI 25.43 kg/m       Objective:   Physical Exam Vitals and nursing note reviewed.  Constitutional:      Appearance: Normal appearance.  Cardiovascular:     Rate and Rhythm: Normal rate and regular rhythm.     Pulses: Normal pulses.     Heart sounds: Normal heart sounds.  Pulmonary:     Effort: Pulmonary effort is normal.     Breath sounds: Normal breath sounds.  Musculoskeletal:        General: Normal range of motion.  Skin:    General: Skin is warm and dry.     Capillary Refill: Capillary refill takes less than 2 seconds.  Neurological:     General: No focal deficit present.     Mental Status: He is alert and oriented to person, place, and time.       Assessment & Plan:  1. Type 2 diabetes mellitus with neurological complications (HCC)  - POC HgB A1c- 6.5  - Well controlled. Will keep on Metformin 500 mg daily.  - Follow up in 6 months  - metFORMIN (GLUCOPHAGE) 500 MG tablet; Take 1 tablet (500 mg total) by mouth daily with breakfast.  Dispense: 90 tablet; Refill: 0  2. B12 deficiency  - Vitamin B12; Future - SYRINGE-NEEDLE, DISP, 3 ML (B-D 3CC LUER-LOK SYR 25GX1") 25G X 1" 3 ML MISC; USE AS DIRECTED  Dispense: 3 each; Refill: 3 - cyanocobalamin (,VITAMIN B-12,) 1000 MCG/ML injection; INJECT 1ML INTRAMUSCULARLY ONCE A MONTH.  Dispense: 3 mL; Refill: 3   Dorothyann Peng, NP

## 2020-07-13 ENCOUNTER — Other Ambulatory Visit: Payer: Self-pay | Admitting: Adult Health

## 2020-07-14 ENCOUNTER — Other Ambulatory Visit: Payer: Self-pay

## 2020-07-14 ENCOUNTER — Ambulatory Visit (INDEPENDENT_AMBULATORY_CARE_PROVIDER_SITE_OTHER): Payer: Medicare Other | Admitting: Adult Health

## 2020-07-14 ENCOUNTER — Encounter: Payer: Self-pay | Admitting: Adult Health

## 2020-07-14 VITALS — BP 130/80 | HR 55 | Temp 98.0°F | Ht 71.0 in | Wt 179.8 lb

## 2020-07-14 DIAGNOSIS — E1149 Type 2 diabetes mellitus with other diabetic neurological complication: Secondary | ICD-10-CM | POA: Diagnosis not present

## 2020-07-14 DIAGNOSIS — E782 Mixed hyperlipidemia: Secondary | ICD-10-CM

## 2020-07-14 DIAGNOSIS — I2510 Atherosclerotic heart disease of native coronary artery without angina pectoris: Secondary | ICD-10-CM

## 2020-07-14 DIAGNOSIS — E538 Deficiency of other specified B group vitamins: Secondary | ICD-10-CM

## 2020-07-14 DIAGNOSIS — N401 Enlarged prostate with lower urinary tract symptoms: Secondary | ICD-10-CM

## 2020-07-14 DIAGNOSIS — N138 Other obstructive and reflux uropathy: Secondary | ICD-10-CM

## 2020-07-14 DIAGNOSIS — Z0000 Encounter for general adult medical examination without abnormal findings: Secondary | ICD-10-CM | POA: Diagnosis not present

## 2020-07-14 DIAGNOSIS — M21372 Foot drop, left foot: Secondary | ICD-10-CM

## 2020-07-14 MED ORDER — METOPROLOL TARTRATE 25 MG PO TABS: 25.0000 mg | ORAL_TABLET | Freq: Two times a day (BID) | ORAL | 3 refills | Status: DC

## 2020-07-14 MED ORDER — KETOCONAZOLE 2 % EX SHAM: MEDICATED_SHAMPOO | CUTANEOUS | 3 refills | Status: DC

## 2020-07-14 NOTE — Patient Instructions (Signed)
It was great seeing you today   I will follow up with you regarding your labs   Someone will call you to schedule your appointment with a foot specialist   Please follow up with me in 6 months

## 2020-07-14 NOTE — Progress Notes (Signed)
Subjective:    Patient ID: Michael Johnston, male    DOB: [birth year], 86 y.o.   MRN: [id number]  HPI Patient presents for yearly preventative medicine examination. He is a pleasant 86 year old male who  has a past medical history of Arthritis, CHEST PAIN, Chronic bronchitis ([place]), COLONIC POLYPS, HX OF, COPD (chronic obstructive pulmonary disease) ([place]), Coronary artery disease, Cystic kidney disease, GERD (gastroesophageal reflux disease), History of hiatal hernia, MI (myocardial infarction) ([place]) (09/2010), Mixed hyperlipidemia, Skin cancer, Sleep apnea, and Syncope and collapse (~ 2013; 05/24/2015).  DM-currently prescribed Metformin 500 mg daily.  He denies nausea, vomiting, diarrhea, or episodes of hypoglycemia.  He does not check his blood sugars at home on a routine basis Lab Results  Component Value Date   HGBA1C 6.5 (A) 06/20/2020   CAD s/p CABG-currently managed by cardiology.  Prescribed Lipitor 20 mg daily, aspirin 81 mg, and metoprolol 25 mg twice daily.  He denies chest pain, shortness of breath, or claudication  Hyperlipidemia -currently prescribed Lipitor 20 mg daily and aspirin 81 mg.  He denies myalgia or fatigue Lab Results  Component Value Date   CHOL 133 03/23/2019   HDL 58.10 03/23/2019   LDLCALC 61 03/23/2019   TRIG 68.0 03/23/2019   CHOLHDL 2 03/23/2019   Vitamin B 12 Deficiency -receives monthly B12 injections Lab Results  Component Value Date   [id number] 440 06/20/2020    BPH- Is seen by Urology but is unable to be seen until March. He does report that he has started having issue with Nocturia up to 3 times a night and has a decreased stream and urge incontinence. He would like to hold off on any medication until he is seen by Urology.   Drop Foot - Left sided. He reports that he has been feeling more unsteady of lately and states " If I start walking to fast I feel like I am going to fall over because I can't lift up my left foot enough. He has been though  PT for this and is wondering if there are any special orthoptics that he can get to help him  All immunizations and health maintenance protocols were reviewed with the patient and needed orders were placed.  Appropriate screening laboratory values were ordered for the patient including screening of hyperlipidemia, renal function and hepatic function. If indicated by BPH, a PSA was ordered.  Medication reconciliation,  past medical history, social history, problem list and allergies were reviewed in detail with the patient  Goals were established with regard to weight loss, exercise, and  diet in compliance with medications  Wt Readings from Last 3 Encounters:  07/14/20 179 lb 12.8 oz (81.6 kg)  06/20/20 182 lb 4.8 oz (82.7 kg)  01/26/20 183 lb (83 kg)    End of life planning was discussed.  Review of Systems  Constitutional: Negative.   HENT: Negative.   Eyes: Negative.   Respiratory: Negative.   Cardiovascular: Negative.   Gastrointestinal: Negative.   Endocrine: Negative.   Genitourinary: Positive for difficulty urinating.       Nocturia   Musculoskeletal: Positive for gait problem.  Skin: Negative.   Allergic/Immunologic: Negative.   Neurological: Positive for weakness (left foot).  Hematological: Negative.   Psychiatric/Behavioral: Negative.   All other systems reviewed and are negative.  Past Medical History:  Diagnosis Date   Arthritis    "right shoulder" (05/24/2015)   CHEST PAIN    Chronic bronchitis ([place])    "get it ~  q yr" (05/24/2015)   COLONIC POLYPS, HX OF    COPD (chronic obstructive pulmonary disease) ([place])    "dx'd but I don't take RX for it" (05/24/2015)   Coronary artery disease    Cystic kidney disease    GERD (gastroesophageal reflux disease)    History of hiatal hernia    MI (myocardial infarction) ([place]) 09/2010   Mixed hyperlipidemia    Skin cancer    "top of my head only" (05/24/2015)   Sleep apnea    Syncope and collapse ~  2013; 05/24/2015    Social History   Socioeconomic History   Marital status: Married    Spouse name: Not on file   Number of children: Not on file   Years of education: Not on file   Highest education level: Not on file  Occupational History   Not on file  Tobacco Use   Smoking status: Former Smoker    Packs/day: 1.50    Years: 3.00    Pack years: 4.50    Types: Cigarettes    Quit date: 12/09/1951    Years since quitting: 68.6   Smokeless tobacco: Never Used  Vaping Use   Vaping Use: Never used  Substance and Sexual Activity   Alcohol use: Yes    Comment: "no alcohol since 1953"   Drug use: No   Sexual activity: Not on file  Other Topics Concern   Not on file  Social History Narrative   Retired    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[place] Strain:    Difficulty of Paying Living Expenses: Not on file  Food Insecurity:    Worried About Charity fundraiser in the Last Year: Not on file   [employer] of Food in the Last Year: Not on file  Transportation Needs: No Transportation Needs   Lack of Transportation (Medical): No   Lack of Transportation (Non-Medical): No  Physical Activity: Inactive   Days of Exercise per Week: 0 days   Minutes of Exercise per Session: 0 min  Stress:    Feeling of Stress : Not on file  Social Connections:    Frequency of Communication with Friends and Family: Not on file   Frequency of Social Gatherings with Friends and Family: Not on file   Attends Religious Services: Not on file   Active Member of Clubs or Organizations: Not on file   Attends Archivist Meetings: Not on file   Marital Status: Not on file  Intimate Partner Violence:    Fear of Current or Ex-Partner: Not on file   Emotionally Abused: Not on file   Physically Abused: Not on file   Sexually Abused: Not on file    Past Surgical History:  Procedure Laterality Date   CARDIAC  CATHETERIZATION  09/2010   [place]   "partial"   CORONARY ANGIOPLASTY     CORONARY ARTERY BYPASS GRAFT  09/2010   [facility] sternotomy for coronary artery bypass grafting x3  (left internal mammary artery to distal left anterior  descending  coronary artery, saphenous vein graft to first diagonal branch,  saphenous vein graft to first  obtuse marginal branch, endoscopic  saphenous vein harvest from right thigh). SURGEON:  Valentina Gu.  Roxy Manns, MD  ASSISTANT:  John Giovanni, PA-C  ANESTHESIA:  Glynda Jaeger, MD    LEFT HEART CATH AND CORS/GRAFTS ANGIOGRAPHY N/A 08/22/2017  Procedure: LEFT HEART CATH AND CORS/GRAFTS ANGIOGRAPHY;  Surgeon: Martinique, Peter M, MD;  Location: [facility];  Service: Cardiovascular;  Laterality: N/A;   SKIN CANCER EXCISION     "top of my head"    Family History  Problem Relation Age of Onset   Lung cancer Sister    Heart attack Father     Allergies  Allergen Reactions   Levaquin [Levofloxacin In D5w] Swelling and Other (See Comments)    Eyes swollen   Other Anaphylaxis and Other (See Comments)    Bee sting   Metformin And Related Nausea Only    Current Outpatient Medications on File Prior to Visit  Medication Sig Dispense Refill   aspirin 81 MG tablet Take 81 mg by mouth daily.     atorvastatin (LIPITOR) 20 MG tablet TAKE 1 TABLET (20 MG TOTAL) BY MOUTH DAILY. 90 tablet 3   cyanocobalamin (,VITAMIN B-12,) 1000 MCG/ML injection INJECT 1ML INTRAMUSCULARLY ONCE A MONTH. 3 mL 3   EPINEPHrine (EPIPEN 2-PAK) 0.3 mg/0.3 mL IJ SOAJ injection      ketoconazole (NIZORAL) 2 % shampoo APPLY EVERY OTHER DAY TO SCALP--LATHER 5 MINUTES,THEN RINSE OUT 120 mL 3   metFORMIN (GLUCOPHAGE) 500 MG tablet Take 1 tablet (500 mg total) by mouth daily with breakfast. 90 tablet 0   metoprolol tartrate (LOPRESSOR) 25 MG tablet TAKE 1 TABLET (25 MG TOTAL) BY MOUTH 2 (TWO) TIMES DAILY. **DUE FOR YEARLY PHYSICAL** 180 tablet 0    nitroGLYCERIN (NITROSTAT) 0.4 MG SL tablet Place 1 tablet (0.4 mg total) under the tongue every 5 (five) minutes as needed for chest pain (3 doses max). 25 tablet 1   omeprazole (PRILOSEC) 20 MG capsule TAKE 1 CAPSULE BY MOUTH EVERY DAY 90 capsule 1   SYRINGE-NEEDLE, DISP, 3 ML (B-D 3CC LUER-LOK SYR 25GX1") 25G X 1" 3 ML MISC USE AS DIRECTED 3 each 3   No current facility-administered medications on file prior to visit.    There were no vitals taken for this visit.      Objective:   Physical Exam Vitals and nursing note reviewed.  Constitutional:      General: He is not in acute distress.    Appearance: Normal appearance. He is well-developed and normal weight.  HENT:     Head: Normocephalic and atraumatic.     Right Ear: Tympanic membrane, ear canal and external ear normal. There is no impacted cerumen.     Left Ear: Tympanic membrane, ear canal and external ear normal. There is no impacted cerumen.     Nose: Nose normal. No congestion or rhinorrhea.     Mouth/Throat:     Mouth: Mucous membranes are moist.     Pharynx: Oropharynx is clear. No oropharyngeal exudate or posterior oropharyngeal erythema.  Eyes:     General:        Right eye: No discharge.        Left eye: No discharge.     Extraocular Movements: Extraocular movements intact.     Conjunctiva/sclera: Conjunctivae normal.     Pupils: Pupils are equal, round, and reactive to light.  Neck:     Vascular: No carotid bruit.     Trachea: No tracheal deviation.  Cardiovascular:     Rate and Rhythm: Normal rate and regular rhythm.     Pulses: Normal pulses.     Heart sounds: Normal heart sounds. No murmur heard.  No friction rub. No gallop.   Pulmonary:     Effort: Pulmonary  effort is normal. No respiratory distress.     Breath sounds: Normal breath sounds. No stridor. No wheezing, rhonchi or rales.  Chest:     Chest wall: No tenderness.  Abdominal:     General: Bowel sounds are normal. There is no distension.      Palpations: Abdomen is soft. There is no mass.     Tenderness: There is no abdominal tenderness. There is no right CVA tenderness, left CVA tenderness, guarding or rebound.     Hernia: No hernia is present.  Musculoskeletal:        General: No swelling, tenderness, deformity or signs of injury. Normal range of motion.     Right lower leg: No edema.     Left lower leg: No edema.  Lymphadenopathy:     Cervical: No cervical adenopathy.  Skin:    General: Skin is warm and dry.     Capillary Refill: Capillary refill takes less than 2 seconds.     Coloration: Skin is not jaundiced or pale.     Findings: No bruising, erythema, lesion or rash.  Neurological:     General: No focal deficit present.     Mental Status: He is alert and oriented to person, place, and time.     Cranial Nerves: No cranial nerve deficit.     Sensory: No sensory deficit.     Motor: No weakness.     Coordination: Coordination normal.     Gait: Gait abnormal.     Deep Tendon Reflexes: Reflexes normal.     Comments: Left foot drag    Psychiatric:        Mood and Affect: Mood normal.        Behavior: Behavior normal.        Thought Content: Thought content normal.        Judgment: Judgment normal.       Assessment & Plan:  1. Routine general medical examination at a health care facility - remarkable 86 year old male.  - Follow up in one year for CPE  - Continue to stay active and exercise  - CBC with Differential/Platelet; Future - Lipid panel; Future - TSH; Future - CMP with eGFR([facility]); Future  2. Type 2 diabetes mellitus with neurological complications (HCC) - Well controlled.  - 6 month follow up  - CBC with Differential/Platelet; Future - Lipid panel; Future - TSH; Future - CMP with eGFR([facility]); Future - Microalbumin/Creatinine Ratio, Urine; Future  3. Mixed hyperlipidemia - Continue statin  - CBC with Differential/Platelet; Future - Lipid panel; Future - TSH; Future - CMP with eGFR([facility]);  Future  4. Coronary artery disease involving native coronary artery of native heart without angina pectoris - Continue with plan of care by cardiology  - CBC with Differential/Platelet; Future - Lipid panel; Future - TSH; Future - CMP with eGFR([facility]); Future - metoprolol tartrate (LOPRESSOR) 25 MG tablet; Take 1 tablet (25 mg total) by mouth 2 (two) times daily.  Dispense: 180 tablet; Refill: 3  5. Benign prostatic hyperplasia with urinary obstruction - Follow up with Urology as directed  6. B12 deficiency - Continue with monthly B12 injections   7. Foot drop, left foot  - Ambulatory referral to [place], NP

## 2020-07-15 LAB — CBC WITH DIFFERENTIAL/PLATELET
Absolute Monocytes: 446 cells/uL (ref 200–950)
Basophils Absolute: 72 cells/uL (ref 0–200)
Basophils Relative: 1.6 %
Eosinophils Absolute: 153 cells/uL (ref 15–500)
Eosinophils Relative: 3.4 %
HCT: 40.1 % (ref 38.5–50.0)
Hemoglobin: 13.4 g/dL (ref 13.2–17.1)
Lymphs Abs: 1634 cells/uL (ref 850–3900)
MCH: 33.2 pg — ABNORMAL HIGH (ref 27.0–33.0)
MCHC: 33.4 g/dL (ref 32.0–36.0)
MCV: 99.3 fL (ref 80.0–100.0)
MPV: 11.3 fL (ref 7.5–12.5)
Monocytes Relative: 9.9 %
Neutro Abs: 2196 cells/uL (ref 1500–7800)
Neutrophils Relative %: 48.8 %
Platelets: 140 10*3/uL (ref 140–400)
RBC: 4.04 10*6/uL — ABNORMAL LOW (ref 4.20–5.80)
RDW: 12.2 % (ref 11.0–15.0)
Total Lymphocyte: 36.3 %
WBC: 4.5 10*3/uL (ref 3.8–10.8)

## 2020-07-15 LAB — COMPLETE METABOLIC PANEL WITH GFR
AG Ratio: 1.7 (calc) (ref 1.0–2.5)
ALT: 15 U/L (ref 9–46)
AST: 17 U/L (ref 10–35)
Albumin: 4.2 g/dL (ref 3.6–5.1)
Alkaline phosphatase (APISO): 67 U/L (ref 35–144)
BUN: 18 mg/dL (ref 7–25)
CO2: 28 mmol/L (ref 20–32)
Calcium: 9.9 mg/dL (ref 8.6–10.3)
Chloride: 103 mmol/L (ref 98–110)
Creat: 0.78 mg/dL (ref 0.70–1.11)
GFR, Est African American: 95 mL/min/{1.73_m2} (ref >=60)
GFR, Est Non African American: 82 mL/min/{1.73_m2} (ref >=60)
Globulin: 2.5 g/dL (calc) (ref 1.9–3.7)
Glucose, Bld: 142 mg/dL — ABNORMAL HIGH (ref 65–99)
Potassium: 4.9 mmol/L (ref 3.5–5.3)
Sodium: 139 mmol/L (ref 135–146)
Total Bilirubin: 0.9 mg/dL (ref 0.2–1.2)
Total Protein: 6.7 g/dL (ref 6.1–8.1)

## 2020-07-15 LAB — LIPID PANEL
Cholesterol: 128 mg/dL (ref <200)
HDL: 61 mg/dL (ref >=40)
LDL Cholesterol (Calc): 54 mg/dL (calc)
Non-HDL Cholesterol (Calc): 67 mg/dL (calc) (ref <130)
Total CHOL/HDL Ratio: 2.1 (calc) (ref <5.0)
Triglycerides: 58 mg/dL (ref <150)

## 2020-07-15 LAB — MICROALBUMIN / CREATININE URINE RATIO
Creatinine, Urine: 62 mg/dL (ref 20–320)
Microalb, Ur: <0.2 mg/dL

## 2020-07-15 LAB — TSH: TSH: 3.13 mIU/L (ref 0.40–4.50)

## 2020-08-02 ENCOUNTER — Ambulatory Visit: Payer: Self-pay | Admitting: Podiatry

## 2020-08-08 DIAGNOSIS — L218 Other seborrheic dermatitis: Secondary | ICD-10-CM | POA: Diagnosis not present

## 2020-08-08 DIAGNOSIS — B353 Tinea pedis: Secondary | ICD-10-CM | POA: Diagnosis not present

## 2020-08-09 ENCOUNTER — Ambulatory Visit (INDEPENDENT_AMBULATORY_CARE_PROVIDER_SITE_OTHER): Payer: Medicare Other

## 2020-08-09 ENCOUNTER — Ambulatory Visit: Payer: Medicare Other | Admitting: Podiatry

## 2020-08-09 ENCOUNTER — Other Ambulatory Visit: Payer: Self-pay

## 2020-08-09 DIAGNOSIS — Z9181 History of falling: Secondary | ICD-10-CM | POA: Diagnosis not present

## 2020-08-09 DIAGNOSIS — M21372 Foot drop, left foot: Secondary | ICD-10-CM

## 2020-08-09 DIAGNOSIS — M21371 Foot drop, right foot: Secondary | ICD-10-CM

## 2020-08-10 ENCOUNTER — Encounter: Payer: Self-pay | Admitting: Podiatry

## 2020-08-10 NOTE — Progress Notes (Signed)
Patient ID: Michael Johnston, male   DOB: [birth year], 86 y.o.   MRN: [id number]    Michael Johnston is seen for F/U CAD SEMI after getting injections at the dentist office September 27, 2010. Subsequently found to have 3VD needing CABG: LIMA to LAD, SVG to D1, SVG to OM  RCA was not grafted    Admitted 08/21/17 for chest pain  R/O CXR NAD no acute ECG changes Cath done by Dr Martinique 08/22/17 patent Grafts normal EF and EDP RCA with no obstructive Disease   Carotid 12/28/15 plaque no stenosis due for f/u Last TTE 05/25/15 EF 55-60% no valve disease   Seen in ER 09/07/19 with chest pain. Woke him up at 4;30 am 7/10 took 2 nitro and ASA pain lasted 20 minutes had more GERD and gas as well In ER no acute ECG changes and negative troponin x 2 Started on Prilosec by primary 09/17/19   Myovue 11/01/19 normal non ischemic EF 60% continue on medical Rx  He is an avid reader of the Bible Does not believe in COVID vaccine and won't get it  Has left foot drop needs bracing and has been falling more   Has a son and daughter that live near him and look in on him Still driving as is his wife  ROS: Denies fever, malais, weight loss, blurry vision, decreased visual acuity, cough, sputum, SOB, hemoptysis, pleuritic pain, palpitaitons, heartburn, abdominal pain, melena, lower extremity edema, claudication, or rash.  All other systems reviewed and negative  General: BP 128/72   Pulse 61   Ht 5\' 11"  (1.803 m)   Wt 82.9 kg   SpO2 95%   BMI 25.50 kg/m  Affect appropriate Healthy:  appears stated age 14: normal Neck supple with no adenopathy JVP normal no bruits no thyromegaly Lungs clear with no wheezing and good diaphragmatic motion Heart:  S1/S2 no murmur, no rub, gallop or click PMI normal post sternotomy  Abdomen: benighn, BS positve, no tenderness, no AAA no bruit.  No HSM or HJR Distal pulses intact with no bruits No edema Neuro non-focal Skin warm and dry Left sided foot drop     Current Outpatient  Medications  Medication Sig Dispense Refill  . aspirin 81 MG tablet Take 81 mg by mouth daily.    Marland Kitchen atorvastatin (LIPITOR) 20 MG tablet TAKE 1 TABLET (20 MG TOTAL) BY MOUTH DAILY. 90 tablet 3  . cyanocobalamin (,VITAMIN B-12,) 1000 MCG/ML injection INJECT 1ML INTRAMUSCULARLY ONCE A MONTH. 3 mL 3  . EPINEPHrine (EPIPEN 2-PAK) 0.3 mg/0.3 mL IJ SOAJ injection     . ketoconazole (NIZORAL) 2 % shampoo Use twice weekly 120 mL 3  . metFORMIN (GLUCOPHAGE) 500 MG tablet Take 1 tablet (500 mg total) by mouth daily with breakfast. 90 tablet 0  . metoprolol tartrate (LOPRESSOR) 25 MG tablet Take 1 tablet (25 mg total) by mouth 2 (two) times daily. 180 tablet 3  . nitroGLYCERIN (NITROSTAT) 0.4 MG SL tablet Place 1 tablet (0.4 mg total) under the tongue every 5 (five) minutes as needed for chest pain (3 doses max). 25 tablet 1  . omeprazole (PRILOSEC) 20 MG capsule TAKE 1 CAPSULE BY MOUTH EVERY DAY 90 capsule 1  . SYRINGE-NEEDLE, DISP, 3 ML (B-D 3CC LUER-LOK SYR 25GX1") 25G X 1" 3 ML MISC USE AS DIRECTED 3 each 3   No current facility-administered medications for this visit.    Allergies  Levaquin [levofloxacin in d5w], Other, and Metformin and related  Electrocardiogram:   08/14/2020  SR rate 56 LAD otherwise normal    Assessment and Plan  CAD:  CABG 2012  Cath 08/22/17 patent grafts  Normal EF  Non ischemic myovue 11/01/19 EF 60% continue medical Rx   HTN:  Well controlled.  Continue current medications and low sodium Dash type diet.    Cholesterol is at goal. 54 07/14/20 continue statin    DM:  Discussed low carb diet.  Target hemoglobin A1c is 6.5 or less.  Continue current medications.  Carotid:  Duplex 12/28/15 plaque no stenosis given age observe given age no need to repeat   GERD:  Improved with prilosec f/u primary Low carb diet          F/U with me in a year   [name]

## 2020-08-10 NOTE — Progress Notes (Signed)
Subjective:  Patient ID: Michael Johnston, male    DOB: Jan 22, 1932,  MRN: [id number]  Chief Complaint  Patient presents with  . FOOT DROP    pt states that his left foot has been dropping for about 2 years and his left foot has been dragging for about 6 months.     86 y.o. male presents with the above complaint.  Patient presents with complaint of left foot drop foot for past 2 years.  Patient states that he does not know how the dropfoot started however he has been dragging his foot for about 6 months.  Patient states is the worst when he is ambulating.  He would like to know if there is any kind of bracing that can be done.  He has not seen anyone else prior to seeing me.  He has not had any kind of bracing.  He has been ambulating with regular shoes.  He denies any other acute complaints.   Review of Systems: Negative except as noted in the HPI. Denies N/V/F/Ch.  Past Medical History:  Diagnosis Date  . Arthritis    "right shoulder" (05/24/2015)  . CHEST PAIN   . Chronic bronchitis ([place])    "get it ~ q yr" (05/24/2015)  . COLONIC POLYPS, HX OF   . COPD (chronic obstructive pulmonary disease) ([place])    "dx'd but I don't take RX for it" (05/24/2015)  . Coronary artery disease   . Cystic kidney disease   . GERD (gastroesophageal reflux disease)   . History of hiatal hernia   . MI (myocardial infarction) ([place]) 09/2010  . Mixed hyperlipidemia   . Skin cancer    "top of my head only" (05/24/2015)  . Sleep apnea   . Syncope and collapse ~ 2013; 05/24/2015    Current Outpatient Medications:  .  aspirin 81 MG tablet, Take 81 mg by mouth daily., Disp: , Rfl:  .  atorvastatin (LIPITOR) 20 MG tablet, TAKE 1 TABLET (20 MG TOTAL) BY MOUTH DAILY., Disp: 90 tablet, Rfl: 3 .  cyanocobalamin (,VITAMIN B-12,) 1000 MCG/ML injection, INJECT 1ML INTRAMUSCULARLY ONCE A MONTH., Disp: 3 mL, Rfl: 3 .  EPINEPHrine (EPIPEN 2-PAK) 0.3 mg/0.3 mL IJ SOAJ injection, , Disp: , Rfl:  .  ketoconazole (NIZORAL)  2 % shampoo, Use twice weekly, Disp: 120 mL, Rfl: 3 .  metFORMIN (GLUCOPHAGE) 500 MG tablet, Take 1 tablet (500 mg total) by mouth daily with breakfast., Disp: 90 tablet, Rfl: 0 .  metoprolol tartrate (LOPRESSOR) 25 MG tablet, Take 1 tablet (25 mg total) by mouth 2 (two) times daily., Disp: 180 tablet, Rfl: 3 .  nitroGLYCERIN (NITROSTAT) 0.4 MG SL tablet, Place 1 tablet (0.4 mg total) under the tongue every 5 (five) minutes as needed for chest pain (3 doses max)., Disp: 25 tablet, Rfl: 1 .  omeprazole (PRILOSEC) 20 MG capsule, TAKE 1 CAPSULE BY MOUTH EVERY DAY, Disp: 90 capsule, Rfl: 1 .  SYRINGE-NEEDLE, DISP, 3 ML (B-D 3CC LUER-LOK SYR 25GX1") 25G X 1" 3 ML MISC, USE AS DIRECTED, Disp: 3 each, Rfl: 3  Social History   Tobacco Use  Smoking Status Former Smoker  . Packs/day: 1.50  . Years: 3.00  . Pack years: 4.50  . Types: Cigarettes  . Quit date: 12/09/1951  . Years since quitting: 68.7  Smokeless Tobacco Never Used    Allergies  Allergen Reactions  . Levaquin [Levofloxacin In D5w] Swelling and Other (See Comments)    Eyes swollen  . Other Anaphylaxis and Other (  See Comments)    Bee sting  . Metformin And Related Nausea Only   Objective:  There were no vitals filed for this visit. There is no height or weight on file to calculate BMI. Constitutional Well developed. Well nourished.  Vascular Dorsalis pedis pulses palpable bilaterally. Posterior tibial pulses palpable bilaterally. Capillary refill normal to all digits.  No cyanosis or clubbing noted. Pedal hair growth normal.  Neurologic Normal speech. Oriented to person, place, and time. Epicritic sensation to light touch grossly present bilaterally.  Dermatologic Nails well groomed and normal in appearance. No open wounds. No skin lesions.  Orthopedic:  Gait examination shows about dropfoot to the left side likely due to weak extensor muscles.  Manual muscle testing shows 4 out of 5 of the extensor muscles 5 out of 5 for  the rest of the posterior lateral compartment muscles on the left side.   Radiographs: 3 views of skeletally mature adult left foot: No osseous abnormalities noted.  No bony abnormalities noted.  No fractures noted. Assessment:   1. Foot drop, right   2. Foot drop, left foot   3. Personal history of fall    Plan:  Patient was evaluated and treated and all questions answered.  Left foot drop foot with history of falls. -I explained to patient the etiology of dropfoot MS treatment options were discussed.  Patient does not have any history of stroke.  Patient does have a history of diabetes that could be attributing to mild left foot drop.  I believe patient will benefit from bracing to allow and help clear the ground.  He does have a history of falls multiple times in a year patient may also benefit from worse balance bracing as well.  I will have the patient scheduled for her to see rec for further evaluation and management.  No follow-ups on file.

## 2020-08-14 ENCOUNTER — Other Ambulatory Visit: Payer: Self-pay

## 2020-08-14 ENCOUNTER — Encounter: Payer: Self-pay | Admitting: Cardiovascular Disease

## 2020-08-14 ENCOUNTER — Ambulatory Visit: Payer: Medicare Other | Admitting: Cardiovascular Disease

## 2020-08-14 VITALS — BP 128/72 | HR 61 | Ht 71.0 in | Wt 182.8 lb

## 2020-08-14 DIAGNOSIS — Z951 Presence of aortocoronary bypass graft: Secondary | ICD-10-CM | POA: Diagnosis not present

## 2020-08-14 NOTE — Patient Instructions (Signed)
Medication Instructions:  *If you need a refill on your cardiac medications before your next appointment, please call your pharmacy*  Lab Work:  If you have labs (blood work) drawn today and your tests are completely normal, you will receive your results only by: Marland Kitchen MyChart Message (if you have MyChart) OR . A paper copy in the mail If you have any lab test that is abnormal or we need to change your treatment, we will call you to review the results.  Follow-Up: At [facility], you and your health needs are our priority.  As part of our continuing mission to provide you with exceptional heart care, we have created designated Provider Care Teams.  These Care Teams include your primary Cardiologist (physician) and Advanced Practice Providers (APPs -  Physician Assistants and Nurse Practitioners) who all work together to provide you with the care you need, when you need it.  We recommend signing up for the patient portal called "MyChart".  Sign up information is provided on this After Visit Summary.  MyChart is used to connect with patients for Virtual Visits (Telemedicine).  Patients are able to view lab/test results, encounter notes, upcoming appointments, etc.  Non-urgent messages can be sent to your provider as well.   To learn more about what you can do with MyChart, go to [email or web address].    Your next appointment:   1 year(s)  The format for your next appointment:   In Person  Provider:   You may see Dr. Johnsie Cancel or one of the following Advanced Practice Providers on your designated Care Team:    Truitt Merle, NP  Cecilie Kicks, NP  Kathyrn Drown, NP

## 2020-08-15 ENCOUNTER — Ambulatory Visit: Payer: Medicare Other | Admitting: Orthotics

## 2020-08-15 DIAGNOSIS — Z9181 History of falling: Secondary | ICD-10-CM

## 2020-08-15 DIAGNOSIS — M21371 Foot drop, right foot: Secondary | ICD-10-CM

## 2020-08-15 DIAGNOSIS — M21372 Foot drop, left foot: Secondary | ICD-10-CM

## 2020-08-15 NOTE — Progress Notes (Signed)
Ypsilon Flow 2.5.Marland KitchenMarland KitchenConfirmation [id number] Fitted for OTS brace left.

## 2020-08-24 ENCOUNTER — Telehealth: Payer: Self-pay

## 2020-08-24 ENCOUNTER — Ambulatory Visit (INDEPENDENT_AMBULATORY_CARE_PROVIDER_SITE_OTHER): Payer: Medicare Other | Admitting: Orthotics

## 2020-08-24 ENCOUNTER — Other Ambulatory Visit: Payer: Self-pay

## 2020-08-24 DIAGNOSIS — M21371 Foot drop, right foot: Secondary | ICD-10-CM

## 2020-08-24 DIAGNOSIS — M21372 Foot drop, left foot: Secondary | ICD-10-CM | POA: Diagnosis not present

## 2020-08-24 NOTE — Progress Notes (Signed)
Patient picked up OTC brace LEFT to address foot drop; the brace fit well in his shoe and there was immediate result of toe clearance in saggital plane.

## 2020-08-24 NOTE — Telephone Encounter (Signed)
Pt walked in asking to talk with Dr. Johnsie Cancel... he says he was seen by a 'foot doctor" but unsure of his name and was told he had a foot fungus and they wanted to put him on a med to treat it but would have to talk to Dr. Johnsie Cancel first to see if he can decrease his BO med first to avoid an "interaction:... pt unclear what any of the meds are and was confused on what he should do... I advised him that I will look at his chart and I saw that he sees [facility].. Dr. Patel/ Velora Heckler.... I called and they were unsure of what he was asking... no notes about a foot fungus... I will forward my not to Dr. Posey Pronto and hopefully can figure out what to do to help the pt.

## 2020-08-29 ENCOUNTER — Ambulatory Visit: Payer: Medicare Other | Admitting: Orthotics

## 2020-08-29 ENCOUNTER — Other Ambulatory Visit: Payer: Self-pay

## 2020-08-29 DIAGNOSIS — M21372 Foot drop, left foot: Secondary | ICD-10-CM

## 2020-08-29 NOTE — Progress Notes (Signed)
ADDED PADDING AND MOLESKIN TO ARREST SHEER IRRIATION.

## 2020-09-07 ENCOUNTER — Other Ambulatory Visit: Payer: Self-pay | Admitting: Adult Health

## 2020-09-07 DIAGNOSIS — E1149 Type 2 diabetes mellitus with other diabetic neurological complication: Secondary | ICD-10-CM

## 2020-09-15 ENCOUNTER — Emergency Department (HOSPITAL_COMMUNITY): Payer: Medicare Other

## 2020-09-15 ENCOUNTER — Encounter (HOSPITAL_COMMUNITY): Payer: Self-pay

## 2020-09-15 ENCOUNTER — Other Ambulatory Visit: Payer: Self-pay

## 2020-09-15 ENCOUNTER — Emergency Department (HOSPITAL_COMMUNITY)
Admission: EM | Admit: 2020-09-15 | Discharge: 2020-09-15 | Disposition: A | Discharge status: Home / Self Care | Payer: Medicare Other | Attending: Emergency Medicine | Admitting: Emergency Medicine

## 2020-09-15 DIAGNOSIS — E119 Type 2 diabetes mellitus without complications: Secondary | ICD-10-CM | POA: Diagnosis not present

## 2020-09-15 DIAGNOSIS — J449 Chronic obstructive pulmonary disease, unspecified: Secondary | ICD-10-CM | POA: Diagnosis not present

## 2020-09-15 DIAGNOSIS — Z87891 Personal history of nicotine dependence: Secondary | ICD-10-CM | POA: Diagnosis not present

## 2020-09-15 DIAGNOSIS — I2510 Atherosclerotic heart disease of native coronary artery without angina pectoris: Secondary | ICD-10-CM | POA: Diagnosis not present

## 2020-09-15 DIAGNOSIS — W11XXXA Fall on and from ladder, initial encounter: Secondary | ICD-10-CM | POA: Insufficient documentation

## 2020-09-15 DIAGNOSIS — M533 Sacrococcygeal disorders, not elsewhere classified: Secondary | ICD-10-CM | POA: Diagnosis not present

## 2020-09-15 DIAGNOSIS — M545 Low back pain, unspecified: Secondary | ICD-10-CM | POA: Diagnosis present

## 2020-09-15 DIAGNOSIS — M5459 Other low back pain: Secondary | ICD-10-CM | POA: Diagnosis not present

## 2020-09-15 DIAGNOSIS — Z043 Encounter for examination and observation following other accident: Secondary | ICD-10-CM | POA: Diagnosis not present

## 2020-09-15 DIAGNOSIS — N281 Cyst of kidney, acquired: Secondary | ICD-10-CM | POA: Insufficient documentation

## 2020-09-15 DIAGNOSIS — Z951 Presence of aortocoronary bypass graft: Secondary | ICD-10-CM | POA: Diagnosis not present

## 2020-09-15 DIAGNOSIS — W19XXXA Unspecified fall, initial encounter: Secondary | ICD-10-CM

## 2020-09-15 DIAGNOSIS — I700 Atherosclerosis of aorta: Secondary | ICD-10-CM | POA: Diagnosis not present

## 2020-09-15 MED ORDER — DICLOFENAC SODIUM 1 % EX GEL: 2.0000 g | Freq: Four times a day (QID) | CUTANEOUS | 0 refills | Status: DC | PRN

## 2020-09-15 NOTE — ED Triage Notes (Signed)
Pt reports fallen on or about 09/04/2020. He has been having back pain and would like to be seen. Pt ambulated into ED.

## 2020-09-15 NOTE — ED Provider Notes (Signed)
Emergency Department Provider Note   I have reviewed the triage vital signs and the nursing notes.   HISTORY  Chief Complaint Back Pain (Fall 12/27)   HPI Michael Johnston is a 87 y.o. male with past medical history reviewed below, not anticoagulated, presents to the emergency department after falling from a ladder with some lower back/sacral pain.  Patient was on the first rung of the ladder when it shifted and he fell backwards.  He did strike the back of his head but did not lose consciousness.  He states he primarily landed on his buttocks.  Denies any numbness or weakness in the legs or arms.  No neck pain.   He was able to get up under his own power.  The initial injury occurred on 09/04/2020.  He states he had some lingering sacral pain and with no complete resolution decided for ED evaluation.  He is not having blood in the bowel movements or pain with defecation. No radiation of symptoms or modifying factors.   Past Medical History:  Diagnosis Date  . Arthritis    "right shoulder" (05/24/2015)  . CHEST PAIN   . Chronic bronchitis ([place])    "get it ~ q yr" (05/24/2015)  . COLONIC POLYPS, HX OF   . COPD (chronic obstructive pulmonary disease) ([place])    "dx'd but I don't take RX for it" (05/24/2015)  . Coronary artery disease   . Cystic kidney disease   . GERD (gastroesophageal reflux disease)   . History of hiatal hernia   . MI (myocardial infarction) ([place]) 09/2010  . Mixed hyperlipidemia   . Skin cancer    "top of my head only" (05/24/2015)  . Sleep apnea   . Syncope and collapse ~ 2013; 05/24/2015    Patient Active Problem List   Diagnosis Date Noted  . Urine stream spraying 02/23/2018  . Flank pain 02/22/2018  . Chest pain 08/21/2017  . Unstable angina ([place]) 08/21/2017  . Renal cyst 12/20/2016  . B12 deficiency 11/08/2016  . Type 2 diabetes mellitus with neurological complications ([place]) 41/96/2229  . Syncope and collapse 05/24/2015  . Benign prostatic hyperplasia  with urinary obstruction 12/12/2014  . Carotid artery stenosis 05/22/2012  . CAD (coronary artery disease) 05/14/2012  . ED (erectile dysfunction) of organic origin 08/19/2011  . FH: CABG (coronary artery bypass surgery) 01/28/2011  . Mixed hyperlipidemia 10/23/2010  . GERD (gastroesophageal reflux disease) 10/22/2010  . History of colonic polyps 06/14/2010    Past Surgical History:  Procedure Laterality Date  . CARDIAC CATHETERIZATION  09/2010  . CHOLECYSTECTOMY OPEN  1998  . COLECTOMY  1998   "partial"  . CORONARY ANGIOPLASTY    . CORONARY ARTERY BYPASS GRAFT  09/2010   Median sternotomy for coronary artery bypass grafting x3  (left internal mammary artery to distal left anterior  descending  coronary artery, saphenous vein graft to first diagonal branch,  saphenous vein graft to first  obtuse marginal branch, endoscopic  saphenous vein harvest from right thigh). SURGEON:  Valentina Gu.  Roxy Manns, MD  ASSISTANT:  John Giovanni, PA-C  ANESTHESIA:  Glynda Jaeger, MD   . LEFT HEART CATH AND CORS/GRAFTS ANGIOGRAPHY N/A 08/22/2017   Procedure: LEFT HEART CATH AND CORS/GRAFTS ANGIOGRAPHY;  Surgeon: Martinique, Peter M, MD;  Location: [facility];  Service: Cardiovascular;  Laterality: N/A;  . SKIN CANCER EXCISION     "top of my head"    Allergies Levaquin [levofloxacin in d5w], Other, and Metformin and  related  Family History  Problem Relation Age of Onset  . Lung cancer Sister   . Heart attack Father     Social History Social History   Tobacco Use  . Smoking status: Former Smoker    Packs/day: 1.50    Years: 3.00    Pack years: 4.50    Types: Cigarettes    Quit date: 12/09/1951    Years since quitting: 68.8  . Smokeless tobacco: Never Used  Vaping Use  . Vaping Use: Never used  Substance Use Topics  . Alcohol use: Not Currently    Comment: "no alcohol since 1953"  . Drug use: No    Review of Systems  Constitutional: No fever/chills Eyes: No visual changes. ENT: No  sore throat. Cardiovascular: Denies chest pain. Respiratory: Denies shortness of breath. Gastrointestinal: No abdominal pain.  No nausea, no vomiting.  No diarrhea.  No constipation. Genitourinary: Negative for dysuria. Musculoskeletal: Positive lower back/sacral pain.  Skin: Negative for rash. Neurological: Negative for headaches, focal weakness or numbness.  10-point ROS otherwise negative.  ____________________________________________   PHYSICAL EXAM:  VITAL SIGNS: ED Triage Vitals  Enc Vitals Group     BP 09/15/20 1138 (!) 158/88     Pulse Rate 09/15/20 1138 60     Resp 09/15/20 1138 18     Temp 09/15/20 1138 98.6 F (37 C)     Temp Source 09/15/20 1138 Oral     SpO2 09/15/20 1138 98 %     Weight 09/15/20 1140 182 lb (82.6 kg)     Height 09/15/20 1140 5\' 11"  (1.803 m)   Constitutional: Alert and oriented. Well appearing and in no acute distress. Eyes: Conjunctivae are normal. PERRL. EOMI. Head: Atraumatic. Nose: No congestion/rhinnorhea. Mouth/Throat: Mucous membranes are moist.   Neck: No stridor.  No cervical spine tenderness to palpation. Cardiovascular: Normal rate, regular rhythm. Good peripheral circulation. Grossly normal heart sounds.   Respiratory: Normal respiratory effort.  No retractions. Lungs CTAB. Gastrointestinal: Soft and nontender. No distention.  Musculoskeletal: No lower extremity tenderness nor edema. No gross deformities of extremities. Normal ROM of the upper and lower extremities.  Neurologic:  Normal speech and language. No gross focal neurologic deficits are appreciated. 5/5 strength in the upper and lower extremities with normal sensation.  Skin:  Skin is warm, dry and intact. No rash noted.  ____________________________________________  RADIOLOGY  CT ABDOMEN PELVIS WO CONTRAST  Result Date: 09/15/2020 CLINICAL DATA:  Fall from ladder. EXAM: CT ABDOMEN AND PELVIS WITHOUT CONTRAST TECHNIQUE: Multidetector CT imaging of the abdomen and  pelvis was performed following the standard protocol without IV contrast. COMPARISON:  None. FINDINGS: Lower chest: No acute abnormality. Hepatobiliary: No focal liver abnormality is seen. Status post cholecystectomy. No biliary dilatation. Pancreas: Unremarkable. No pancreatic ductal dilatation or surrounding inflammatory changes. Spleen: Normal in size without focal abnormality. Adrenals/Urinary Tract: Adrenal glands appear normal. Bilateral renal cysts are noted, the largest measuring 12.3 mm arising from upper pole of left kidney. Probable bilateral parapelvic cysts are noted. No hydronephrosis or renal obstruction is noted. No renal or ureteral calculi are noted. Urinary bladder is unremarkable. Stomach/Bowel: The stomach appears normal. Postsurgical changes are seen involving the right colon. There is no evidence of bowel obstruction or inflammation. Vascular/Lymphatic: Aortic atherosclerosis. No enlarged abdominal or pelvic lymph nodes. Reproductive: Prostate is unremarkable. Other: Penile reservoir is noted in left lower quadrant. No hernia is noted. No ascites is noted. Musculoskeletal: No acute or significant osseous findings. IMPRESSION: 1. Aortic atherosclerosis. 2. Bilateral  renal cysts. 3. No acute abnormality seen in the abdomen or pelvis. Aortic Atherosclerosis (ICD10-I70.0). Electronically Signed   By: Marijo Conception M.D.   On: 09/15/2020 12:34    ____________________________________________   PROCEDURES  Procedure(s) performed:   Procedures  None  ____________________________________________   INITIAL IMPRESSION / ASSESSMENT AND PLAN / ED COURSE  Pertinent labs & imaging results that were available during my care of the patient were reviewed by me and considered in my medical decision making (see chart for details).   Patient presents to the emergency department more than 1 week after mechanical fall from the first rung of a ladder.  He is not anticoagulated.  He is having no  headaches or neuro symptoms to suspect subacute or cranial hemorrhage.  Do not plan on CT imaging of the head or cervical spine.  He has no midline cervical spine tenderness.  Normal neurological exam and alertness.  He is having some sacral pain and lower back discomfort but no neurodeficits.  Given age, continued pain, mechanism plan for CT imaging of the abdomen and pelvis.   CT imaging reviewed showing no acute intra-abdominal pathology.  Bony structures in the lower back and sacrum are normal.  No fractures.  Plan for topical pain medications and close PCP follow-up. Discussed ED return precautions.   ____________________________________________  FINAL CLINICAL IMPRESSION(S) / ED DIAGNOSES  Final diagnoses:  Sacral back pain  Fall, initial encounter    NEW OUTPATIENT MEDICATIONS STARTED DURING THIS VISIT:  Discharge Medication List as of 09/15/2020 12:39 PM    START taking these medications   Details  diclofenac Sodium (VOLTAREN) 1 % GEL Apply 2 g topically 4 (four) times daily as needed., Starting Fri 09/15/2020, Print        Note:  This document was prepared using Dragon voice recognition software and may include unintentional dictation errors.  Nanda Quinton, MD, [facility] Emergency Medicine    Kameryn Tisdel, [facility], MD 09/18/20 [phone]

## 2020-09-15 NOTE — Discharge Instructions (Signed)
You were seen in the emergency department today after a fall.  Your CT scan does not show any broken bones or fractures.  Please use the topical pain medication provided as needed.  Follow with your primary care doctor in the coming week.  Return to the emergency department any new or suddenly worsening symptoms.

## 2020-09-21 ENCOUNTER — Ambulatory Visit: Payer: Medicare Other | Admitting: Pharmacist

## 2020-09-21 DIAGNOSIS — R350 Frequency of micturition: Secondary | ICD-10-CM | POA: Diagnosis not present

## 2020-09-21 DIAGNOSIS — N281 Cyst of kidney, acquired: Secondary | ICD-10-CM | POA: Diagnosis not present

## 2020-09-21 DIAGNOSIS — E782 Mixed hyperlipidemia: Secondary | ICD-10-CM

## 2020-09-21 DIAGNOSIS — N138 Other obstructive and reflux uropathy: Secondary | ICD-10-CM | POA: Diagnosis not present

## 2020-09-21 DIAGNOSIS — E1149 Type 2 diabetes mellitus with other diabetic neurological complication: Secondary | ICD-10-CM

## 2020-09-21 NOTE — Chronic Care Management (AMB) (Signed)
Chronic Care Management Pharmacy  Name: Michael Johnston  MRN: [id number] DOB: 1932-04-22  Chief Complaint/ HPI  Michael Johnston,  87 y.o. , male presents for their Follow-Up CCM visit with the clinical pharmacist via telephone due to COVID-19 Pandemic.   PCP : Dorothyann Peng, NP  Their chronic conditions include: CAD, GERD, DM II, HLD, Vit B12 deficiency, CABG  Office Visits: 07/14/20 Dorothyann Peng, NP: Patient presented for annual exam. Referral placed for podiatry.  06/20/20 Dorothyann Peng, NP: Patient presented for DM follow up. A1c decreased to 6.5% and vitamin B12 WNL.  09/17/19 OV - hospitalization f/u. No chest pain since ER visit. Followed by cardiology. Started on omeprazole 20 mg daily for GERD.   Consult Visit: 09/15/20 Patient presented to the ED for back pain.  08/14/20 [name], MD (cardiology): Patient presented for CABG follow up. No changes.  08/09/20 Boneta Lucks, DPM (podiatry): Patient presented for right foot drop.  01/26/20 OV - ([facility]ish[facility], Cardio) - CABG f/u. HTN well controlled. Cholesterol at goal. A1c goal is < 6.5%. No changes with meds. F/u in a year.  Medications: Outpatient Encounter Medications as of 09/21/2020  Medication Sig   aspirin 81 MG tablet Take 81 mg by mouth daily.   atorvastatin (LIPITOR) 20 MG tablet TAKE 1 TABLET BY MOUTH EVERY DAY   cyanocobalamin (,VITAMIN B-12,) 1000 MCG/ML injection INJECT 1ML INTRAMUSCULARLY ONCE A MONTH.   metoprolol tartrate (LOPRESSOR) 25 MG tablet Take 1 tablet (25 mg total) by mouth 2 (two) times daily.   diclofenac Sodium (VOLTAREN) 1 % GEL Apply 2 g topically 4 (four) times daily as needed.   EPINEPHrine (EPIPEN 2-PAK) 0.3 mg/0.3 mL IJ SOAJ injection    ketoconazole (NIZORAL) 2 % shampoo Use twice weekly   metFORMIN (GLUCOPHAGE) 500 MG tablet TAKE 1 TABLET BY MOUTH EVERY DAY WITH BREAKFAST   nitroGLYCERIN (NITROSTAT) 0.4 MG SL tablet Place 1 tablet (0.4 mg total) under the tongue every 5 (five)  minutes as needed for chest pain (3 doses max).   SYRINGE-NEEDLE, DISP, 3 ML (B-D 3CC LUER-LOK SYR 25GX1") 25G X 1" 3 ML MISC USE AS DIRECTED   [DISCONTINUED] omeprazole (PRILOSEC) 20 MG capsule TAKE 1 CAPSULE BY MOUTH EVERY DAY   No facility-administered encounter medications on file as of 09/21/2020.   Transportation Needs: No Data processing manager (Medical): No   Lack of Transportation (Non-Medical): No   Physical Activity: Inactive   Days of Exercise per Week: 0 days   Minutes of Exercise per Session: 0 min   Patient reports his biggest concern right now is that his new foot doctor (Dr. Anabel Bene) at [facility] wants to prescribe a medication and there is an interaction with another medication he is currently taking. He was not sure of the name of the medication or the interaction.  [facility]ed [facility] and confirmed that they are hoping to prescribe oral Lamisil for his foot fungus which has an interaction with metoprolol and can increase risk for bradycardia.  Current Diagnosis/Assessment:  Goals Addressed            This Visit's Progress    Chronic Care Management       CARE PLAN ENTRY  Current Barriers:   Chronic Disease Management support, education, and care coordination needs related to Hypertension, Hyperlipidemia, and Diabetes   Hypertension BP Readings from Last 3 Encounters:  09/15/20 (!) 157/104  08/14/20 128/72  07/14/20 130/80    Pharmacist Clinical  Goal(s): o Over the next 120 days, patient will work with PharmD and providers to achieve BP goal <140/90  Current regimen:   Metoprolol tartrate 25 mg 1 tablet twice daily  Nitrostat 0.4 mg SL 1 tablet under tongue every 5 mins PRN for chest pain. 3 doses max.  Interventions: o Discussed low salt diet and exercising as tolerated extensively  Patient self care activities - Over the next 120 days, patient will: o Check blood pressure at least  once a week, document, and provide at future appointments o Ensure daily salt intake < 2300 mg/day  Hyperlipidemia Lab Results  Component Value Date/Time   LDLCALC 54 07/14/2020 10:03 AM    Pharmacist Clinical Goal(s): o Over the next 120 days, patient will work with PharmD and providers to maintain LDL goal < 100  Current regimen:  o Atorvastatin 20 mg 1 tablet daily  Interventions: o Discussed low cholesterol diet and exercising as tolerated extensively  Patient self care activities - Over the next 120 days, patient will: o Continue control with diet and increase exercise as tolerated  Diabetes Lab Results  Component Value Date/Time   HGBA1C 6.5 (A) 06/20/2020 09:24 AM   HGBA1C 7.0 (H) 09/17/2019 09:18 AM   HGBA1C 7.1 (H) 03/23/2019 10:10 AM   HGBA1C 6.7 11/06/2018 09:25 AM    Pharmacist Clinical Goal(s): o Over the next 180 days, patient will work with PharmD and providers to maintain A1c goal <7%  Current regimen:  o Metformin 500 mg 1 tablet daily with breakfast  Interventions: o Discussed carbohydrate counting and exercising as tolerated extensively o Discussed symptoms of low blood sugars and how to treat them  Patient self care activities - Over the next 120 days, patient will: o Check blood sugar in the morning before eating or drinking, document, and provide at future appointments o Contact provider with any episodes of hypoglycemia o Check blood sugars at least when symptomatic  Medication management  Pharmacist Clinical Goal(s): o Over the next 120 days, patient will work with PharmD and providers to maintain optimal medication adherence  Current pharmacy: [facility]  Interventions o Comprehensive medication review performed. o Continue current medication management strategy  Patient self care activities - Over the next 180 days, patient will: o Focus on medication adherence by using weekly pill organizers o Take medications as  prescribed o Report any questions or concerns to PharmD and/or provider(s)  Please see past updates related to this goal by clicking on the "Past Updates" button in the selected goal         Hypertension   Office blood pressures are  BP Readings from Last 3 Encounters:  09/15/20 (!) 157/104  08/14/20 128/72  07/14/20 130/80   Patient has failed these meds in the past: None Patient is currently controlled on the following medications:   Metoprolol tartrate 25 mg 1 tablet twice daily AM and PM  Nitrostat 0.4 mg SL 1 tablet under tongue every 5 mins PRN for chest pain. 3 doses max.  Patient checks BP at home when feeling symptomatic  Patient is not checking lately: n/a  Patient has not been checking his BP lately despite his elevated office reading  Plan Patient will check BP at least once weekly. Plan for BP assessment in 3 weeks. Continue current medications and control diet Will send message about Lamisil interaction to PCP.   Mixed Hyperlipidemia  LDL goal < 70  Lipid Panel     Component Value Date/Time   CHOL 128  07/14/2020 1003   TRIG 58 07/14/2020 1003   HDL 61 07/14/2020 1003   CHOLHDL 2.1 07/14/2020 1003   VLDL 13.6 03/23/2019 1010   LDLCALC 54 07/14/2020 1003     The ASCVD Risk score (Goff DC Jr., et al., 2013) failed to calculate for the following reasons:   The 2013 ASCVD risk score is only valid for ages 6 to 73   Patient has failed these meds in past: simvastatin  Patient is currently controlled on the following medications:   Atorvastatin 20 mg 1 tablet daily at bedtime   Plan  Continue current medications and control diet  Diabetes   Recent Relevant Labs: Lab Results  Component Value Date/Time   HGBA1C 6.5 (A) 06/20/2020 09:24 AM   HGBA1C 7.0 (H) 09/17/2019 09:18 AM   HGBA1C 7.1 (H) 03/23/2019 10:10 AM   HGBA1C 6.7 11/06/2018 09:25 AM   GFR 109.05 03/23/2019 10:10 AM   GFR 100.68 11/06/2018 09:43 AM   MICROALBUR <0.2 07/14/2020  10:03 AM   MICROALBUR <0.7 03/19/2018 08:51 AM     Checking BG: once a month  Recent FBG Readings: 140, 147 (the month before)  Patient has failed these meds in past: None Patient is currently controlled on the following medications:   Metformin 500 mg 1 tablet daily with breakfast  A1c has improved. Recommended checking blood sugars when experiencing sweating as this can be a sign of hypoglycemia with beta blocker usage.   Last diabetic Eye exam:  Lab Results  Component Value Date/Time   HMDIABEYEEXA No Retinopathy 07/27/2018 09:10 AM    Last diabetic Foot exam: No results found for: HMDIABFOOTEX   Plan Patient will check BGs if sweating at night to check for hypoglycemia. Continue current medications and control diet   GERD   Patient has failed these meds in past: None Patient is currently controlled on the following medications:   No medications  Patient reports stopped taking omeprazole due to long term risks.  We discussed: Non-pharmacologic management of symptoms such as elevating the head of your bed, avoiding eating 2-3 hours before bed, avoiding triggering foods such as acidic, spicy, or fatty foods, eating smaller meals, and wearing clothes that are loose around the waist; denies symptoms of heartburn or reflex and takes alka seltzer PRN  Plan  Continue current medications    Vitamin B12  Deficiency   Lab Results  Component Value Date/Time   [id number] 06/20/2020 09:48 AM  ] Patient has failed these meds in past: None Patient is currently controlled on the following medications:   Vitamin B12 1000 mcg/ml inject 1 ml once a month  Vitamin B12 level is normal. Patient reports daughter in law coming to the house once a month to do his shot.   Plan  Continue current medications    OTCs/Health Maintenance   Patient is currently controlled on the following medications:  Aspirin 81 mg 1 tablet daily  Epipen 0.3mg /0.3 ml inject as needed for  anaphylaxis  Ketoconazole 2% shampoo apply every other day to scalp - lather for 5 minutes then rinse out  Plan  Continue current medications    Vaccines   Reviewed and discussed patient's vaccination history.    Immunization History  Administered Date(s) Administered   Fluad Quad(high Dose 65+) 06/20/2020   Influenza Split 07/05/2011, 05/22/2012, 07/28/2013   Influenza Whole 06/14/2010   Influenza, High Dose Seasonal PF 06/25/2014, 06/03/2016, 06/02/2017, 07/02/2018   Influenza,inj,Quad PF,6+ Mos 07/02/2018   Influenza-Unspecified 05/18/2015, 06/02/2017   Pneumococcal  Conjugate-13 09/14/2013   Pneumococcal Polysaccharide-23 09/09/2005, 07/05/2011   Td 09/10/2003   Tetanus 09/14/2013    Plan  Recommended patient receive shingrix vaccine in the pharmacy/office.   Medication Management   Patient's preferred pharmacy is:  [id number] [place], [place] [address] Phone: [phone] Fax: [phone]  Uses pill box? Yes - spouse helps with medications Pt endorses 100% compliance  We discussed: Current pharmacy is preferred with insurance plan and patient is satisfied with pharmacy services  Patient has been compliant with his medications and [facility]. Introduced to him the option to on board with [facility], but he states that he is happy with the service he gets with his pharmacy now.  Plan  Continue current medication management strategy   Follow up: 4 month phone visit  Jeni Salles, PharmD [place] Pharmacist [place] [facility] [phone]

## 2020-09-29 NOTE — Progress Notes (Signed)
He can cut down the Metoprolol to 12.5 mg in the office chance he has an interaction with lamisil

## 2020-09-29 NOTE — Patient Instructions (Signed)
Visit Information  Goals Addressed            This Visit's Progress    Chronic Care Management       CARE PLAN ENTRY  Current Barriers:   Chronic Disease Management support, education, and care coordination needs related to Hypertension, Hyperlipidemia, and Diabetes   Hypertension BP Readings from Last 3 Encounters:  09/15/20 (!) 157/104  08/14/20 128/72  07/14/20 130/80    Pharmacist Clinical Goal(s): o Over the next 120 days, patient will work with PharmD and providers to achieve BP goal <140/90  Current regimen:   Metoprolol tartrate 25 mg 1 tablet twice daily  Nitrostat 0.4 mg SL 1 tablet under tongue every 5 mins PRN for chest pain. 3 doses max.  Interventions: o Discussed low salt diet and exercising as tolerated extensively  Patient self care activities - Over the next 120 days, patient will: o Check blood pressure at least once a week, document, and provide at future appointments o Ensure daily salt intake < 2300 mg/day  Hyperlipidemia Lab Results  Component Value Date/Time   LDLCALC 54 07/14/2020 10:03 AM    Pharmacist Clinical Goal(s): o Over the next 120 days, patient will work with PharmD and providers to maintain LDL goal < 100  Current regimen:  o Atorvastatin 20 mg 1 tablet daily  Interventions: o Discussed low cholesterol diet and exercising as tolerated extensively  Patient self care activities - Over the next 120 days, patient will: o Continue control with diet and increase exercise as tolerated  Diabetes Lab Results  Component Value Date/Time   HGBA1C 6.5 (A) 06/20/2020 09:24 AM   HGBA1C 7.0 (H) 09/17/2019 09:18 AM   HGBA1C 7.1 (H) 03/23/2019 10:10 AM   HGBA1C 6.7 11/06/2018 09:25 AM    Pharmacist Clinical Goal(s): o Over the next 180 days, patient will work with PharmD and providers to maintain A1c goal <7%  Current regimen:  o Metformin 500 mg 1 tablet daily with breakfast  Interventions: o Discussed carbohydrate counting  and exercising as tolerated extensively o Discussed symptoms of low blood sugars and how to treat them  Patient self care activities - Over the next 120 days, patient will: o Check blood sugar in the morning before eating or drinking, document, and provide at future appointments o Contact provider with any episodes of hypoglycemia o Check blood sugars at least when symptomatic  Medication management  Pharmacist Clinical Goal(s): o Over the next 120 days, patient will work with PharmD and providers to maintain optimal medication adherence  [facility]  Interventions o Comprehensive medication review performed. o Continue current medication management strategy  Patient self care activities - Over the next 180 days, patient will: o Focus on medication adherence by using weekly pill organizers o Take medications as prescribed o Report any questions or concerns to PharmD and/or provider(s)  Please see past updates related to this goal by clicking on the "Past Updates" button in the selected goal         Patient verbalizes understanding of instructions provided today and agrees to view in [place].   Telephone follow up appointment with pharmacy team member scheduled for: 4 months  Viona Gilmore, [facility]

## 2020-10-19 ENCOUNTER — Telehealth: Payer: Self-pay | Admitting: Pharmacist

## 2020-10-19 NOTE — Chronic Care Management (AMB) (Signed)
Chronic Care Management Pharmacy Assistant   Name: Michael Johnston  MRN: [id number] DOB: 1931-09-23  Reason for Encounter: Disease State/Hypertension Adherence Call  PCP : Dorothyann Peng, NP  Allergies:   Allergies  Allergen Reactions  . Levaquin [Levofloxacin In D5w] Swelling and Other (See Comments)    Eyes swollen  . Other Anaphylaxis and Other (See Comments)    Bee sting  . Metformin And Related Nausea Only    Medications: Outpatient Encounter Medications as of 10/19/2020  Medication Sig  . aspirin 81 MG tablet Take 81 mg by mouth daily.  Marland Kitchen atorvastatin (LIPITOR) 20 MG tablet TAKE 1 TABLET BY MOUTH EVERY DAY  . cyanocobalamin (,VITAMIN B-12,) 1000 MCG/ML injection INJECT 1ML INTRAMUSCULARLY ONCE A MONTH.  Marland Kitchen diclofenac Sodium (VOLTAREN) 1 % GEL Apply 2 g topically 4 (four) times daily as needed.  Marland Kitchen EPINEPHrine (EPIPEN 2-PAK) 0.3 mg/0.3 mL IJ SOAJ injection   . ketoconazole (NIZORAL) 2 % shampoo Use twice weekly  . metFORMIN (GLUCOPHAGE) 500 MG tablet TAKE 1 TABLET BY MOUTH EVERY DAY WITH BREAKFAST  . metoprolol tartrate (LOPRESSOR) 25 MG tablet Take 1 tablet (25 mg total) by mouth 2 (two) times daily.  . nitroGLYCERIN (NITROSTAT) 0.4 MG SL tablet Place 1 tablet (0.4 mg total) under the tongue every 5 (five) minutes as needed for chest pain (3 doses max).  . SYRINGE-NEEDLE, DISP, 3 ML (B-D 3CC LUER-LOK SYR 25GX1") 25G X 1" 3 ML MISC USE AS DIRECTED   No facility-administered encounter medications on file as of 10/19/2020.    Current Diagnosis: Patient Active Problem List   Diagnosis Date Noted  . Urine stream spraying 02/23/2018  . Flank pain 02/22/2018  . Chest pain 08/21/2017  . Unstable angina ([place]) 08/21/2017  . Renal cyst 12/20/2016  . B12 deficiency 11/08/2016  . Type 2 diabetes mellitus with neurological complications ([place]) 92/42/6834  . Syncope and collapse 05/24/2015  . Benign prostatic hyperplasia with urinary obstruction 12/12/2014  . Carotid artery  stenosis 05/22/2012  . CAD (coronary artery disease) 05/14/2012  . ED (erectile dysfunction) of organic origin 08/19/2011  . FH: CABG (coronary artery bypass surgery) 01/28/2011  . Mixed hyperlipidemia 10/23/2010  . GERD (gastroesophageal reflux disease) 10/22/2010  . History of colonic polyps 06/14/2010    Goals Addressed   None    Reviewed chart prior to disease state call. Spoke with patient regarding BP  Recent Office Vitals: BP Readings from Last 3 Encounters:  09/15/20 (!) 157/104  08/14/20 128/72  07/14/20 130/80   Pulse Readings from Last 3 Encounters:  09/15/20 (!) 58  08/14/20 61  07/14/20 (!) 55    Wt Readings from Last 3 Encounters:  09/15/20 182 lb (82.6 kg)  08/14/20 182 lb 12.8 oz (82.9 kg)  07/14/20 179 lb 12.8 oz (81.6 kg)     Kidney Function Lab Results  Component Value Date/Time   CREATININE 0.78 07/14/2020 10:03 AM   CREATININE 0.76 09/07/2019 06:30 AM   CREATININE 0.69 03/23/2019 10:10 AM   GFR 109.05 03/23/2019 10:10 AM   GFRNONAA 82 07/14/2020 10:03 AM   GFRAA 95 07/14/2020 10:03 AM    BMP Latest Ref Rng & Units 07/14/2020 09/07/2019 03/23/2019  Glucose 65 - 99 mg/dL 142(H) 153(H) 140(H)  BUN 7 - 25 mg/dL 18 26(H) 22  Creatinine 0.70 - 1.11 mg/dL 0.78 0.76 0.69  BUN/Creat Ratio 6 - 22 (calc) NOT APPLICABLE - -  Sodium 196 - 146 mmol/L 139 141 138  Potassium 3.5 - 5.3 mmol/L  4.9 4.6 4.6  Chloride 98 - 110 mmol/L 103 104 103  CO2 20 - 32 mmol/L 28 28 28   Calcium 8.6 - 10.3 mg/dL 9.9 9.2 9.2    . Current antihypertensive regimen:  o Metoprolol tartrate 25 mg 1 tablet twice daily AM and PM o Nitrostat 0.4 mg SL 1 tablet under tongue every 5 mins PRN for chest pain. 3 doses  . How often are you checking your Blood Pressure? daily . Current home BP readings:  o 02/06 132/74 o 02/07 136/68 o 02/08 141/69 o 02/09 136/66 o 02/10 129/64 . What recent interventions/DTPs have been made by any provider to improve Blood Pressure control since  last CPP Visit: None . Any recent hospitalizations or ED visits since last visit with CPP? No . What diet changes have been made to improve Blood Pressure Control?  o No change . What exercise is being done to improve your Blood Pressure Control?  o Walks and Yard word  Adherence Review: Is the patient currently on ACE/ARB medication? No Does the patient have >5 day gap between last estimated fill dates? No  Follow-Up:  Pharmacist Review  Maia Breslow, [place] Assistant [phone]

## 2020-10-20 ENCOUNTER — Telehealth: Payer: Self-pay | Admitting: Adult Health

## 2020-10-20 NOTE — Telephone Encounter (Signed)
Left message for patient to call back and schedule Medicare Annual Wellness Visit (AWV) either virtually or in office. Did not leave detailed message  Last AWV 03/19/18  please schedule at anytime with [facility] Nurse Health Advisor 1 or 2   This should be a 45 minute visit.

## 2020-10-24 ENCOUNTER — Other Ambulatory Visit: Payer: Self-pay

## 2020-10-25 ENCOUNTER — Encounter: Payer: Self-pay | Admitting: Adult Health

## 2020-10-25 ENCOUNTER — Encounter (INDEPENDENT_AMBULATORY_CARE_PROVIDER_SITE_OTHER): Payer: Medicare Other | Admitting: Adult Health

## 2020-11-07 ENCOUNTER — Ambulatory Visit (INDEPENDENT_AMBULATORY_CARE_PROVIDER_SITE_OTHER): Payer: Medicare Other

## 2020-11-07 DIAGNOSIS — Z0000 Encounter for general adult medical examination without abnormal findings: Secondary | ICD-10-CM

## 2020-11-07 NOTE — Patient Instructions (Signed)
Michael Johnston , Thank you for taking time to come for your Medicare Wellness Visit. I appreciate your ongoing commitment to your health goals. Please review the following plan we discussed and let me know if I can assist you in the future.   Screening recommendations/referrals: Colonoscopy: No longer required  Recommended yearly ophthalmology/optometry visit for glaucoma screening and checkup Recommended yearly dental visit for hygiene and checkup  Vaccinations: Influenza vaccine: Up to date, next due fall 2022  Pneumococcal vaccine: Completed series  Tdap vaccine: Up to date, next due 09/15/2023 Shingles vaccine: Currently due for Shingrix, if you would like to receive we recommend that you do so at your local pharmacy as it is less expensive     Advanced directives: Advance directive discussed with you today. Even though you declined this today please call our office should you change your mind and we can give you the proper paperwork for you to fill out.   Conditions/risks identified: None   Next appointment: 01/12/2021 @ 9:30 am with Dorothyann Peng, NP   Preventive Care 85 Years and Older, Male Preventive care refers to lifestyle choices and visits with your health care provider that can promote health and wellness. What does preventive care include?  A yearly physical exam. This is also called an annual well check.  Dental exams once or twice a year.  Routine eye exams. Ask your health care provider how often you should have your eyes checked.  Personal lifestyle choices, including:  Daily care of your teeth and gums.  Regular physical activity.  Eating a healthy diet.  Avoiding tobacco and drug use.  Limiting alcohol use.  Practicing safe sex.  Taking low doses of aspirin every day.  Taking vitamin and mineral supplements as recommended by your health care provider. What happens during an annual well check? The services and screenings done by your health care  provider during your annual well check will depend on your age, overall health, lifestyle risk factors, and family history of disease. Counseling  Your health care provider may ask you questions about your:  Alcohol use.  Tobacco use.  Drug use.  Emotional well-being.  Home and relationship well-being.  Sexual activity.  Eating habits.  History of falls.  Memory and ability to understand (cognition).  Work and work Statistician. Screening  You may have the following tests or measurements:  Height, weight, and BMI.  Blood pressure.  Lipid and cholesterol levels. These may be checked every 5 years, or more frequently if you are over 69 years old.  Skin check.  Lung cancer screening. You may have this screening every year starting at age 85 if you have a 30-pack-year history of smoking and currently smoke or have quit within the past 15 years.  Fecal occult blood test (FOBT) of the stool. You may have this test every year starting at age 85.  Flexible sigmoidoscopy or colonoscopy. You may have a sigmoidoscopy every 5 years or a colonoscopy every 10 years starting at age 85.  Prostate cancer screening. Recommendations will vary depending on your family history and other risks.  Hepatitis C blood test.  Hepatitis B blood test.  Sexually transmitted disease (STD) testing.  Diabetes screening. This is done by checking your blood sugar (glucose) after you have not eaten for a while (fasting). You may have this done every 1-3 years.  Abdominal aortic aneurysm (AAA) screening. You may need this if you are a current or former smoker.  Osteoporosis. You may be screened starting  at age 85 if you are at high risk. Talk with your health care provider about your test results, treatment options, and if necessary, the need for more tests. Vaccines  Your health care provider may recommend certain vaccines, such as:  Influenza vaccine. This is recommended every year.  Tetanus,  diphtheria, and acellular pertussis (Tdap, Td) vaccine. You may need a Td booster every 10 years.  Zoster vaccine. You may need this after age 20.  Pneumococcal 13-valent conjugate (PCV13) vaccine. One dose is recommended after age 1.  Pneumococcal polysaccharide (PPSV23) vaccine. One dose is recommended after age 68. Talk to your health care provider about which screenings and vaccines you need and how often you need them. This information is not intended to replace advice given to you by your health care provider. Make sure you discuss any questions you have with your health care provider. Document Released: 09/22/2015 Document Revised: 05/15/2016 Document Reviewed: 06/27/2015 Elsevier Interactive Patient Education  2017 [employer] Prevention in the Home Falls can cause injuries. They can happen to people of all ages. There are many things you can do to make your home safe and to help prevent falls. What can I do on the outside of my home?  Regularly fix the edges of walkways and driveways and fix any cracks.  Remove anything that might make you trip as you walk through a door, such as a raised step or threshold.  Trim any bushes or trees on the path to your home.  Use bright outdoor lighting.  Clear any walking paths of anything that might make someone trip, such as rocks or tools.  Regularly check to see if handrails are loose or broken. Make sure that both sides of any steps have handrails.  Any raised decks and porches should have guardrails on the edges.  Have any leaves, snow, or ice cleared regularly.  Use sand or salt on walking paths during winter.  Clean up any spills in your garage right away. This includes oil or grease spills. What can I do in the bathroom?  Use night lights.  Install grab bars by the toilet and in the tub and shower. Do not use towel bars as grab bars.  Use non-skid mats or decals in the tub or shower.  If you need to sit down in  the shower, use a plastic, non-slip stool.  Keep the floor dry. Clean up any water that spills on the floor as soon as it happens.  Remove soap buildup in the tub or shower regularly.  Attach bath mats securely with double-sided non-slip rug tape.  Do not have throw rugs and other things on the floor that can make you trip. What can I do in the bedroom?  Use night lights.  Make sure that you have a light by your bed that is easy to reach.  Do not use any sheets or blankets that are too big for your bed. They should not hang down onto the floor.  Have a firm chair that has side arms. You can use this for support while you get dressed.  Do not have throw rugs and other things on the floor that can make you trip. What can I do in the kitchen?  Clean up any spills right away.  Avoid walking on wet floors.  Keep items that you use a lot in easy-to-reach places.  If you need to reach something above you, use a strong step stool that has a grab bar.  Keep electrical cords out of the way.  Do not use floor polish or wax that makes floors slippery. If you must use wax, use non-skid floor wax.  Do not have throw rugs and other things on the floor that can make you trip. What can I do with my stairs?  Do not leave any items on the stairs.  Make sure that there are handrails on both sides of the stairs and use them. Fix handrails that are broken or loose. Make sure that handrails are as long as the stairways.  Check any carpeting to make sure that it is firmly attached to the stairs. Fix any carpet that is loose or worn.  Avoid having throw rugs at the top or bottom of the stairs. If you do have throw rugs, attach them to the floor with carpet tape.  Make sure that you have a light switch at the top of the stairs and the bottom of the stairs. If you do not have them, ask someone to add them for you. What else can I do to help prevent falls?  Wear shoes that:  Do not have high  heels.  Have rubber bottoms.  Are comfortable and fit you well.  Are closed at the toe. Do not wear sandals.  If you use a stepladder:  Make sure that it is fully opened. Do not climb a closed stepladder.  Make sure that both sides of the stepladder are locked into place.  Ask someone to hold it for you, if possible.  Clearly mark and make sure that you can see:  Any grab bars or handrails.  First and last steps.  Where the edge of each step is.  Use tools that help you move around (mobility aids) if they are needed. These include:  Canes.  Walkers.  Scooters.  Crutches.  Turn on the lights when you go into a dark area. Replace any light bulbs as soon as they burn out.  Set up your furniture so you have a clear path. Avoid moving your furniture around.  If any of your floors are uneven, fix them.  If there are any pets around you, be aware of where they are.  Review your medicines with your doctor. Some medicines can make you feel dizzy. This can increase your chance of falling. Ask your doctor what other things that you can do to help prevent falls. This information is not intended to replace advice given to you by your health care provider. Make sure you discuss any questions you have with your health care provider. Document Released: 06/22/2009 Document Revised: 02/01/2016 Document Reviewed: 09/30/2014 Elsevier Interactive Patient Education  2017 [employer].

## 2020-11-07 NOTE — Progress Notes (Signed)
Subjective:   Michael Johnston is a 87 y.o. male who presents for Medicare Annual/Subsequent preventive examination.  I connected with Thamas Jaegers today by telephone and verified that I am speaking with the correct person using two identifiers. Location patient: home Location provider: work Persons participating in the virtual visit: patient, provider.   I discussed the limitations, risks, security and privacy concerns of performing an evaluation and management service by telephone and the availability of in person appointments. I also discussed with the patient that there may be a patient responsible charge related to this service. The patient expressed understanding and verbally consented to this telephonic visit.    Interactive audio and video telecommunications were attempted between this provider and patient, however failed, due to patient having technical difficulties OR patient did not have access to video capability.  We continued and completed visit with audio only.      Review of Systems    N/A  Cardiac Risk Factors include: advanced age (>8men, >44 women);male gender;hypertension;diabetes mellitus;dyslipidemia     Objective:    There were no vitals filed for this visit. There is no height or weight on file to calculate BMI.  Advanced Directives 11/07/2020 09/15/2020 09/07/2019 03/31/2018 08/21/2017 08/21/2017 11/09/2016  Does Patient Have a Medical Advance Directive? No No No No No No No  Would patient like information on creating a medical advance directive? No - Patient declined No - Patient declined - - No - Patient declined - Yes (ED - Information included in AVS)  Pre-existing out of facility DNR order (yellow form or pink MOST form) - - - - - - -    Current Medications (verified) Outpatient Encounter Medications as of 11/07/2020  Medication Sig  . aspirin 81 MG tablet Take 81 mg by mouth daily.  Marland Kitchen atorvastatin (LIPITOR) 20 MG tablet TAKE 1 TABLET BY MOUTH EVERY  DAY  . cyanocobalamin (,VITAMIN B-12,) 1000 MCG/ML injection INJECT 1ML INTRAMUSCULARLY ONCE A MONTH.  Marland Kitchen ketoconazole (NIZORAL) 2 % shampoo Use twice weekly  . metFORMIN (GLUCOPHAGE) 500 MG tablet TAKE 1 TABLET BY MOUTH EVERY DAY WITH BREAKFAST  . metoprolol tartrate (LOPRESSOR) 25 MG tablet Take 1 tablet (25 mg total) by mouth 2 (two) times daily.  Marland Kitchen terbinafine (LAMISIL) 250 MG tablet TAKE ONE TABLET BY MOUTH DAILY, NEEDS LABS CHECKED AFTER 6 WEEKS  . diclofenac Sodium (VOLTAREN) 1 % GEL Apply 2 g topically 4 (four) times daily as needed. (Patient not taking: Reported on 11/07/2020)  . EPINEPHrine 0.3 mg/0.3 mL IJ SOAJ injection  (Patient not taking: Reported on 11/07/2020)  . nitroGLYCERIN (NITROSTAT) 0.4 MG SL tablet Place 1 tablet (0.4 mg total) under the tongue every 5 (five) minutes as needed for chest pain (3 doses max). (Patient not taking: Reported on 11/07/2020)  . SYRINGE-NEEDLE, DISP, 3 ML (B-D 3CC LUER-LOK SYR 25GX1") 25G X 1" 3 ML MISC USE AS DIRECTED (Patient not taking: Reported on 11/07/2020)   No facility-administered encounter medications on file as of 11/07/2020.    Allergies (verified) Levaquin [levofloxacin in d5w], Other, and Metformin and related   History: Past Medical History:  Diagnosis Date  . Arthritis    "right shoulder" (05/24/2015)  . CHEST PAIN   . Chronic bronchitis ([place])    "get it ~ q yr" (05/24/2015)  . COLONIC POLYPS, HX OF   . COPD (chronic obstructive pulmonary disease) ([place])    "dx'd but I don't take RX for it" (05/24/2015)  . Coronary artery disease   .  Cystic kidney disease   . GERD (gastroesophageal reflux disease)   . History of hiatal hernia   . MI (myocardial infarction) ([place]) 09/2010  . Mixed hyperlipidemia   . Skin cancer    "top of my head only" (05/24/2015)  . Sleep apnea   . Syncope and collapse ~ 2013; 05/24/2015   Past Surgical History:  Procedure Laterality Date  . CARDIAC CATHETERIZATION  09/2010  . CHOLECYSTECTOMY OPEN  1998  .  COLECTOMY  1998   "partial"  . CORONARY ANGIOPLASTY    . CORONARY ARTERY BYPASS GRAFT  09/2010   Median sternotomy for coronary artery bypass grafting x3  (left internal mammary artery to distal left anterior  descending  coronary artery, saphenous vein graft to first diagonal branch,  saphenous vein graft to first  obtuse marginal branch, endoscopic  saphenous vein harvest from right thigh). SURGEON:  Valentina Gu.  Roxy Manns, MD  ASSISTANT:  John Giovanni, PA-C  ANESTHESIA:  Glynda Jaeger, MD   . LEFT HEART CATH AND CORS/GRAFTS ANGIOGRAPHY N/A 08/22/2017   Procedure: LEFT HEART CATH AND CORS/GRAFTS ANGIOGRAPHY;  Surgeon: Martinique, Peter M, MD;  Location: [facility];  Service: Cardiovascular;  Laterality: N/A;  . SKIN CANCER EXCISION     "top of my head"   Family History  Problem Relation Age of Onset  . Lung cancer Sister   . Heart attack Father    Social History   Socioeconomic History  . Marital status: Married    Spouse name: Not on file  . Number of children: Not on file  . Years of education: Not on file  . Highest education level: Not on file  Occupational History  . Not on file  Tobacco Use  . Smoking status: Former Smoker    Packs/day: 1.50    Years: 3.00    Pack years: 4.50    Types: Cigarettes    Quit date: 12/09/1951    Years since quitting: 68.9  . Smokeless tobacco: Never Used  Vaping Use  . Vaping Use: Never used  Substance and Sexual Activity  . Alcohol use: Not Currently    Comment: "no alcohol since 1953"  . Drug use: No  . Sexual activity: Not on file  Other Topics Concern  . 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 Difficulty of Paying Living Expenses: Not hard at all  Food Insecurity: No Food Insecurity  . Worried About Charity fundraiser in the Last Year: Never true  . Ran Out of Food in the Last Year: Never true   Transportation Needs: No Transportation Needs  . Lack of Transportation (Medical): No  . Lack of Transportation (Non-Medical): No  Physical Activity: Inactive  . Days of Exercise per Week: 0 days  . Minutes of Exercise per Session: 0 min  Stress: No Stress Concern Present  . Feeling of Stress : Not at all  Social Connections: Moderately Integrated  . Frequency of Communication with Friends and Family: More than three times a week  . Frequency of Social Gatherings with Friends and Family: Three times a week  . Attends Religious Services: More than 4 times per year  . Active Member of Clubs or Organizations: No  . Attends Archivist Meetings: Never  . Marital Status: Married    Tobacco Counseling  Counseling given: Not Answered   Clinical Intake:  Pre-visit preparation completed: Yes        Nutritional Risks: None Diabetes: Yes CBG done?: No Did pt. bring in CBG monitor from home?: No  How often do you need to have someone help you when you read instructions, pamphlets, or other written materials from your doctor or pharmacy?: 1 - Never  Diabetic?Yes Nutrition Risk Assessment:  Has the patient had any N/V/D within the last 2 months?  No  Does the patient have any non-healing wounds?  No  Has the patient had any unintentional weight loss or weight gain?  No   Diabetes:  Is the patient diabetic?  Yes  If diabetic, was a CBG obtained today?  No  Did the patient bring in their glucometer from home?  No  How often do you monitor your CBG's? Patient states checks glucose once per week.   Financial Strains and Diabetes Management:  Are you having any financial strains with the device, your supplies or your medication? No .  Does the patient want to be seen by Chronic Care Management for management of their diabetes?  No  Would the patient like to be referred to a Nutritionist or for Diabetic Management?  No   Diabetic Exams:  Diabetic Eye Exam: Overdue for  diabetic eye exam. Pt has been advised about the importance in completing this exam. Patient advised to call and schedule an eye exam. Diabetic Foot Exam: Completed 07/14/2020   Interpreter Needed?: No  Information entered by :: [place] of Daily Living In your present state of health, do you have any difficulty performing the following activities: 11/07/2020  Hearing? Y  Comment has bilateral hearing aids  Vision? N  Difficulty concentrating or making decisions? N  Walking or climbing stairs? N  Dressing or bathing? N  Doing errands, shopping? N  Preparing Food and eating ? N  Using the Toilet? N  In the past six months, have you accidently leaked urine? N  Do you have problems with loss of bowel control? N  Managing your Medications? N  Managing your Finances? N  Housekeeping or managing your Housekeeping? N  Some recent data might be hidden    Patient Care Team: Dorothyann Peng, NP as PCP - General (Family Medicine) Viona Gilmore, [facility] as Pharmacist (Pharmacist)  Indicate any recent Medical Services you may have received from other than [facility] providers in the past year (date may be approximate).     Assessment:   This is a routine wellness examination for [place].  Hearing/Vision screen  Hearing Screening   125Hz  250Hz  500Hz  1000Hz  2000Hz  3000Hz  4000Hz  6000Hz  8000Hz   Right ear:           Left ear:           Vision Screening Comments: Patient states gets eyes examined once per year. Wears glasses   Dietary issues and exercise activities discussed: Current Exercise Habits: The patient does not participate in regular exercise at present, Exercise limited by: None identified  Goals    . Chronic Care Management     CARE PLAN ENTRY  Current Barriers:  . Chronic Disease Management support, education, and care coordination needs related to Hypertension, Hyperlipidemia, and Diabetes   Hypertension BP Readings from Last 3 Encounters:  09/15/20 (!)  157/104  08/14/20 128/72  07/14/20 130/80   . Pharmacist Clinical Goal(s): o Over the next 120 days, patient will work with PharmD and providers to achieve BP  goal <140/90 . Current regimen:  . Metoprolol tartrate 25 mg 1 tablet twice daily . Nitrostat 0.4 mg SL 1 tablet under tongue every 5 mins PRN for chest pain. 3 doses max. . Interventions: o Discussed low salt diet and exercising as tolerated extensively . Patient self care activities - Over the next 120 days, patient will: o Check blood pressure at least once a week, document, and provide at future appointments o Ensure daily salt intake < 2300 mg/day  Hyperlipidemia Lab Results  Component Value Date/Time   LDLCALC 54 07/14/2020 10:03 AM   . Pharmacist Clinical Goal(s): o Over the next 120 days, patient will work with PharmD and providers to maintain LDL goal < 100 . Current regimen:  o Atorvastatin 20 mg 1 tablet daily . Interventions: o Discussed low cholesterol diet and exercising as tolerated extensively . Patient self care activities - Over the next 120 days, patient will: o Continue control with diet and increase exercise as tolerated  Diabetes Lab Results  Component Value Date/Time   HGBA1C 6.5 (A) 06/20/2020 09:24 AM   HGBA1C 7.0 (H) 09/17/2019 09:18 AM   HGBA1C 7.1 (H) 03/23/2019 10:10 AM   HGBA1C 6.7 11/06/2018 09:25 AM   . Pharmacist Clinical Goal(s): o Over the next 180 days, patient will work with PharmD and providers to maintain A1c goal <7% . Current regimen:  o Metformin 500 mg 1 tablet daily with breakfast . Interventions: o Discussed carbohydrate counting and exercising as tolerated extensively o Discussed symptoms of low blood sugars and how to treat them . Patient self care activities - Over the next 120 days, patient will: o Check blood sugar in the morning before eating or drinking, document, and provide at future appointments o Contact provider with any episodes of hypoglycemia o Check  blood sugars at least when symptomatic  Medication management . Pharmacist Clinical Goal(s): o Over the next 120 days, patient will work with PharmD and providers to maintain optimal medication adherence . Current pharmacy: [facility] . Interventions o Comprehensive medication review performed. o Continue current medication management strategy . Patient self care activities - Over the next 180 days, patient will: o Focus on medication adherence by using weekly pill organizers o Take medications as prescribed o Report any questions or concerns to PharmD and/or provider(s)  Please see past updates related to this goal by clicking on the "Past Updates" button in the selected goal        Depression Screen PHQ 2/9 Scores 11/07/2020 07/14/2020 03/23/2019 03/19/2018 10/18/2014 08/31/2014  PHQ - 2 Score 0 0 0 0 0 0    Fall Risk Fall Risk  11/07/2020 07/14/2020 03/23/2019 03/19/2018 10/18/2014  Falls in the past year? 1 1 0 Yes Yes  Number falls in past yr: 1 1 - 2 or more 1  Injury with Fall? 0 0 - No No  Risk for fall due to : Impaired balance/gait;History of fall(s) History of fall(s) - - Impaired balance/gait  Follow up Falls evaluation completed;Falls prevention discussed Falls evaluation completed - - -    FALL RISK PREVENTION PERTAINING TO THE HOME:  Any stairs in or around the home? Yes  If so, are there any without handrails? No  Home free of loose throw rugs in walkways, pet beds, electrical cords, etc? Yes  Adequate lighting in your home to reduce risk of falls? Yes   ASSISTIVE DEVICES UTILIZED TO PREVENT FALLS:  Life alert? No  Use of a cane, walker or w/c? No  Grab  bars in the bathroom? No  Shower chair or bench in shower? No  Elevated toilet seat or a handicapped toilet? No    Cognitive Function:     Normal cognitive status assessed by direct observation by this Nurse Health Advisor. No abnormalities found.      Immunizations Immunization History  Administered Date(s)  Administered  . Fluad Quad(high Dose 65+) 06/07/2019, 06/20/2020  . Influenza Inj Mdck Quad Pf 06/07/2019  . Influenza Split 07/05/2011, 05/22/2012, 07/28/2013  . Influenza Whole 06/14/2010  . Influenza, High Dose Seasonal PF 06/25/2014, 06/03/2016, 06/02/2017, 07/02/2018  . Influenza,inj,Quad PF,6+ Mos 07/02/2018  . Influenza-Unspecified 05/18/2015, 06/02/2017  . Pneumococcal Conjugate-13 09/14/2013  . Pneumococcal Polysaccharide-23 09/09/2005, 07/05/2011  . Td 09/10/2003  . Tetanus 09/14/2013    TDAP status: Up to date  Flu Vaccine status: Up to date  Pneumococcal vaccine status: Up to date  Covid-19 vaccine status: Declined, Education has been provided regarding the importance of this vaccine but patient still declined. Advised may receive this vaccine at local pharmacy or Health Dept.or vaccine clinic. Aware to provide a copy of the vaccination record if obtained from local pharmacy or Health Dept. Verbalized acceptance and understanding.  Qualifies for Shingles Vaccine? Yes   Zostavax completed No   Shingrix Completed?: No.    Education has been provided regarding the importance of this vaccine. Patient has been advised to call insurance company to determine out of pocket expense if they have not yet received this vaccine. Advised may also receive vaccine at local pharmacy or Health Dept. Verbalized acceptance and understanding.  Screening Tests Health Maintenance  Topic Date Due  . COVID-19 Vaccine (1) Never done  . OPHTHALMOLOGY EXAM  07/28/2019  . HEMOGLOBIN A1C  12/19/2020  . FOOT EXAM  07/14/2021  . URINE MICROALBUMIN  07/14/2021  . TETANUS/TDAP  09/15/2023  . INFLUENZA VACCINE  Completed  . PNA vac Low Risk Adult  Completed  . HPV VACCINES  Aged Out    Health Maintenance  Health Maintenance Due  Topic Date Due  . COVID-19 Vaccine (1) Never done  . OPHTHALMOLOGY EXAM  07/28/2019    Colorectal cancer screening: No longer required.   Lung Cancer Screening:  (Low Dose CT Chest recommended if Age 81-80 years, 30 pack-year currently smoking OR have quit w/in 15years.) does not qualify.   Lung Cancer Screening Referral: N/A   Additional Screening:  Hepatitis C Screening: does not qualify;   Vision Screening: Recommended annual ophthalmology exams for early detection of glaucoma and other disorders of the eye. Is the patient up to date with their annual eye exam?  Yes  Who is the provider or what is the name of the office in which the patient attends annual eye exams? [facility] in [place]  If pt is not established with a provider, would they like to be referred to a provider to establish care? No .   Dental Screening: Recommended annual dental exams for proper oral hygiene  Community Resource Referral / Chronic Care Management: CRR required this visit?  No   CCM required this visit?  No      Plan:     I have personally reviewed and noted the following in the patient's chart:   . Medical and social history . Use of alcohol, tobacco or illicit drugs  . Current medications and supplements . Functional ability and status . Nutritional status . Physical activity . Advanced directives . List of other physicians . Hospitalizations, surgeries, and ER visits in  previous 12 months . Vitals . Screenings to include cognitive, depression, and falls . Referrals and appointments  In addition, I have reviewed and discussed with patient certain preventive protocols, quality metrics, and best practice recommendations. A written personalized care plan for preventive services as well as general preventive health recommendations were provided to patient.     Ofilia Neas, LPN   0/12/5995   Nurse Notes: None

## 2020-11-13 ENCOUNTER — Telehealth: Payer: Self-pay | Admitting: Adult Health

## 2020-11-13 NOTE — Telephone Encounter (Signed)
Please advise 

## 2020-11-13 NOTE — Telephone Encounter (Signed)
Pts spouse is calling in stating that the pt will have completed his medication terbinafine (LAMISIL) 250 MG and stated that he is suppose to be having a labs done after finishing the the medication and he will be completing it on Wednesday 11/15/2020.  May we have orders so that the pt can be scheduled?

## 2020-11-14 ENCOUNTER — Other Ambulatory Visit: Payer: Self-pay | Admitting: Adult Health

## 2020-11-14 DIAGNOSIS — B351 Tinea unguium: Secondary | ICD-10-CM

## 2020-11-14 NOTE — Telephone Encounter (Signed)
Lab is in

## 2020-11-14 NOTE — Telephone Encounter (Signed)
Lab entered

## 2020-11-23 ENCOUNTER — Other Ambulatory Visit: Payer: Self-pay

## 2020-11-23 ENCOUNTER — Other Ambulatory Visit (INDEPENDENT_AMBULATORY_CARE_PROVIDER_SITE_OTHER): Payer: Medicare Other

## 2020-11-23 DIAGNOSIS — B351 Tinea unguium: Secondary | ICD-10-CM | POA: Diagnosis not present

## 2020-11-23 LAB — HEPATIC FUNCTION PANEL
ALT: 16 U/L (ref 0–53)
AST: 16 U/L (ref 0–37)
Albumin: 4 g/dL (ref 3.5–5.2)
Alkaline Phosphatase: 61 U/L (ref 39–117)
Bilirubin, Direct: 0.1 mg/dL (ref 0.0–0.3)
Total Bilirubin: 0.5 mg/dL (ref 0.2–1.2)
Total Protein: 6.1 g/dL (ref 6.0–8.3)

## 2020-11-27 ENCOUNTER — Telehealth: Payer: Self-pay

## 2020-11-27 NOTE — Telephone Encounter (Signed)
Spoke with patient, he wanted to know is it possible for him to receive two B-12 shots per month, he doesn't have much strength in his body.

## 2020-11-28 ENCOUNTER — Other Ambulatory Visit: Payer: Self-pay | Admitting: Adult Health

## 2020-11-28 DIAGNOSIS — E538 Deficiency of other specified B group vitamins: Secondary | ICD-10-CM

## 2020-11-28 MED ORDER — CYANOCOBALAMIN 1000 MCG/ML IJ SOLN: INTRAMUSCULAR | 6 refills | Status: DC

## 2020-11-28 MED ORDER — "BD LUER-LOK SYRINGE 25G X 1"" 3 ML MISC": 6 refills | Status: DC

## 2020-11-28 NOTE — Telephone Encounter (Signed)
Patient informed of message requesting refills of Cyanocobalamin 1000 mcg, to be sent to pharmacy.

## 2020-11-28 NOTE — Telephone Encounter (Signed)
I am fine with setting him up for Bi monthly B12 injections

## 2020-12-28 DIAGNOSIS — M9903 Segmental and somatic dysfunction of lumbar region: Secondary | ICD-10-CM | POA: Diagnosis not present

## 2020-12-28 DIAGNOSIS — M5136 Other intervertebral disc degeneration, lumbar region: Secondary | ICD-10-CM | POA: Diagnosis not present

## 2021-01-02 DIAGNOSIS — M9903 Segmental and somatic dysfunction of lumbar region: Secondary | ICD-10-CM | POA: Diagnosis not present

## 2021-01-02 DIAGNOSIS — M5136 Other intervertebral disc degeneration, lumbar region: Secondary | ICD-10-CM | POA: Diagnosis not present

## 2021-01-04 DIAGNOSIS — M9903 Segmental and somatic dysfunction of lumbar region: Secondary | ICD-10-CM | POA: Diagnosis not present

## 2021-01-04 DIAGNOSIS — M5136 Other intervertebral disc degeneration, lumbar region: Secondary | ICD-10-CM | POA: Diagnosis not present

## 2021-01-12 ENCOUNTER — Other Ambulatory Visit: Payer: Self-pay

## 2021-01-12 ENCOUNTER — Ambulatory Visit (INDEPENDENT_AMBULATORY_CARE_PROVIDER_SITE_OTHER): Payer: Medicare Other | Admitting: Adult Health

## 2021-01-12 ENCOUNTER — Encounter: Payer: Self-pay | Admitting: Adult Health

## 2021-01-12 VITALS — BP 120/70 | HR 51 | Temp 97.7°F | Ht 71.0 in | Wt 178.0 lb

## 2021-01-12 DIAGNOSIS — I10 Essential (primary) hypertension: Secondary | ICD-10-CM

## 2021-01-12 DIAGNOSIS — E1149 Type 2 diabetes mellitus with other diabetic neurological complication: Secondary | ICD-10-CM | POA: Diagnosis not present

## 2021-01-12 LAB — POCT GLYCOSYLATED HEMOGLOBIN (HGB A1C): Hemoglobin A1C: 6.6 % — AB (ref 4.0–5.6)

## 2021-01-12 MED ORDER — METFORMIN HCL 500 MG PO TABS: 500.0000 mg | ORAL_TABLET | Freq: Every day | ORAL | 1 refills | Status: DC

## 2021-01-12 NOTE — Progress Notes (Signed)
Subjective:    Patient ID: Michael Johnston, male    DOB: Oct 16, 1931, 87 y.o.   MRN: [id number]  HPI  87 year old male who  has a past medical history of Arthritis, CHEST PAIN, Chronic bronchitis ([place]), COLONIC POLYPS, HX OF, COPD (chronic obstructive pulmonary disease) ([place]), Coronary artery disease, Cystic kidney disease, GERD (gastroesophageal reflux disease), History of hiatal hernia, MI (myocardial infarction) ([place]) (09/2010), Mixed hyperlipidemia, Skin cancer, Sleep apnea, and Syncope and collapse (~ 2013; 05/24/2015).  He presents to the office today for routine 6 month follow up d/t history of DM and HTN  DM -prescribed metformin 500 mg daily.  He denies nausea, vomiting, diarrhea, or episodes of hypoglycemia.  He does not check his blood sugars at home on a routine basis. His last A1c was 6.5   HTN -takes metoprolol 25 mg twice daily.  He denies dizziness, lightheadedness, chest pain, or shortness of breath.  He does not check his blood pressures at home on a routine basis BP Readings from Last 3 Encounters:  01/12/21 120/70  10/25/20 118/62  09/15/20 (!) 157/104   Review of Systems See HPI   Past Medical History:  Diagnosis Date  . Arthritis    "right shoulder" (05/24/2015)  . CHEST PAIN   . Chronic bronchitis ([place])    "get it ~ q yr" (05/24/2015)  . COLONIC POLYPS, HX OF   . COPD (chronic obstructive pulmonary disease) ([place])    "dx'd but I don't take RX for it" (05/24/2015)  . Coronary artery disease   . Cystic kidney disease   . GERD (gastroesophageal reflux disease)   . History of hiatal hernia   . MI (myocardial infarction) ([place]) 09/2010  . Mixed hyperlipidemia   . Skin cancer    "top of my head only" (05/24/2015)  . Sleep apnea   . Syncope and collapse ~ 2013; 05/24/2015    Social History   Socioeconomic History  . Marital status: Married    Spouse name: Not on file  . Number of children: Not on file  . Years of education: Not on file  . Highest education  level: Not on file  Occupational History  . Not on file  Tobacco Use  . Smoking status: Former Smoker    Packs/day: 1.50    Years: 3.00    Pack years: 4.50    Types: Cigarettes    Quit date: 12/09/1951    Years since quitting: 69.1  . Smokeless tobacco: Never Used  Vaping Use  . Vaping Use: Never used  Substance and Sexual Activity  . Alcohol use: Not Currently    Comment: "no alcohol since 1953"  . Drug use: No  . Sexual activity: Not on file  Other Topics Concern  . 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 Difficulty of Paying Living Expenses: Not hard at all  Food Insecurity: No Food Insecurity  . Worried About Charity fundraiser in the Last Year: Never true  . Ran Out of Food in the Last Year: Never true  Transportation Needs: No Transportation Needs  . Lack of Transportation (Medical): No  . Lack of Transportation (Non-Medical): No  Physical Activity: Inactive  . Days of Exercise per Week: 0 days  . Minutes of Exercise per Session: 0 min  Stress: No Stress Concern  Present  . Feeling of Stress : Not at all  Social Connections: Moderately Integrated  . Frequency of Communication with Friends and Family: More than three times a week  . Frequency of Social Gatherings with Friends and Family: Three times a week  . Attends Religious Services: More than 4 times per year  . Active Member of Clubs or Organizations: No  . Attends Archivist Meetings: Never  . Marital Status: Married  Human resources officer Violence: Not At Risk  . Fear of Current or Ex-Partner: No  . Emotionally Abused: No  . Physically Abused: No  . Sexually Abused: No    Past Surgical History:  Procedure Laterality Date  . CARDIAC CATHETERIZATION  09/2010  . CHOLECYSTECTOMY OPEN  1998  . COLECTOMY  1998   "partial"  . CORONARY ANGIOPLASTY    . CORONARY ARTERY BYPASS GRAFT   09/2010   Median sternotomy for coronary artery bypass grafting x3  (left internal mammary artery to distal left anterior  descending  coronary artery, saphenous vein graft to first diagonal branch,  saphenous vein graft to first  obtuse marginal branch, endoscopic  saphenous vein harvest from right thigh). SURGEON:  Valentina Gu.  Roxy Manns, MD  ASSISTANT:  John Giovanni, PA-C  ANESTHESIA:  Glynda Jaeger, MD   . LEFT HEART CATH AND CORS/GRAFTS ANGIOGRAPHY N/A 08/22/2017   Procedure: LEFT HEART CATH AND CORS/GRAFTS ANGIOGRAPHY;  Surgeon: Martinique, Peter M, MD;  Location: [facility];  Service: Cardiovascular;  Laterality: N/A;  . SKIN CANCER EXCISION     "top of my head"    Family History  Problem Relation Age of Onset  . Lung cancer Sister   . Heart attack Father     Allergies  Allergen Reactions  . Levaquin [Levofloxacin In D5w] Swelling and Other (See Comments)    Eyes swollen  . Other Anaphylaxis and Other (See Comments)    Bee sting  . Metformin And Related Nausea Only    Current Outpatient Medications on File Prior to Visit  Medication Sig Dispense Refill  . aspirin 81 MG tablet Take 81 mg by mouth daily.    Marland Kitchen atorvastatin (LIPITOR) 20 MG tablet TAKE 1 TABLET BY MOUTH EVERY DAY 90 tablet 3  . cyanocobalamin (,VITAMIN B-12,) 1000 MCG/ML injection INJECT 1ML INTRAMUSCULARLY every other week 3 mL 6  . diclofenac Sodium (VOLTAREN) 1 % GEL Apply 2 g topically 4 (four) times daily as needed. 50 g 0  . EPINEPHrine 0.3 mg/0.3 mL IJ SOAJ injection     . ketoconazole (NIZORAL) 2 % shampoo Use twice weekly 120 mL 3  . metFORMIN (GLUCOPHAGE) 500 MG tablet TAKE 1 TABLET BY MOUTH EVERY DAY WITH BREAKFAST 90 tablet 0  . metoprolol tartrate (LOPRESSOR) 25 MG tablet Take 1 tablet (25 mg total) by mouth 2 (two) times daily. 180 tablet 3  . nitroGLYCERIN (NITROSTAT) 0.4 MG SL tablet Place 1 tablet (0.4 mg total) under the tongue every 5 (five) minutes as needed for chest pain (3 doses max). 25  tablet 1  . SYRINGE-NEEDLE, DISP, 3 ML (B-D 3CC LUER-LOK SYR 25GX1") 25G X 1" 3 ML MISC USE AS DIRECTED 3 each 6  . terbinafine (LAMISIL) 250 MG tablet TAKE ONE TABLET BY MOUTH DAILY, NEEDS LABS CHECKED AFTER 6 WEEKS     No current facility-administered medications on file prior to visit.    BP 120/70 (BP Location: Left Arm, Patient Position: Sitting, Cuff Size: Normal)   Pulse (!) 51  Temp 97.7 F (36.5 C) (Oral)   Ht 5\' 11"  (1.803 m)   Wt 178 lb (80.7 kg)   SpO2 99%   BMI 24.83 kg/m       Objective:   Physical Exam Vitals and nursing note reviewed.  Constitutional:      Appearance: Normal appearance.  Cardiovascular:     Rate and Rhythm: Normal rate and regular rhythm.     Pulses: Normal pulses.     Heart sounds: Normal heart sounds.  Pulmonary:     Effort: Pulmonary effort is normal.     Breath sounds: Normal breath sounds.  Skin:    General: Skin is warm and dry.     Capillary Refill: Capillary refill takes less than 2 seconds.  Neurological:     General: No focal deficit present.     Mental Status: He is alert and oriented to person, place, and time.  Psychiatric:        Mood and Affect: Mood normal.        Behavior: Behavior normal.        Thought Content: Thought content normal.        Judgment: Judgment normal.       Assessment & Plan:   1. Type 2 diabetes mellitus with neurological complications (HCC)  - POC HgB A1c- 6.6 - no change in therapy  - metFORMIN (GLUCOPHAGE) 500 MG tablet; Take 1 tablet (500 mg total) by mouth daily with breakfast.  Dispense: 90 tablet; Refill: 1 - Follow up in 6 months   2. Essential hypertension - Controlled. No change   Dorothyann Peng

## 2021-01-12 NOTE — Patient Instructions (Addendum)
It was great seeing you today   Your A1c was 6.6 - you are doing great   You may want to try something called a sock liner to help prevent the leg brace from rubbing.   I will see you back after 11/5 for your physical exam

## 2021-01-16 DIAGNOSIS — B351 Tinea unguium: Secondary | ICD-10-CM | POA: Diagnosis not present

## 2021-01-16 DIAGNOSIS — L820 Inflamed seborrheic keratosis: Secondary | ICD-10-CM | POA: Diagnosis not present

## 2021-01-16 DIAGNOSIS — B353 Tinea pedis: Secondary | ICD-10-CM | POA: Diagnosis not present

## 2021-01-30 ENCOUNTER — Telehealth: Payer: Medicare Other

## 2021-01-30 ENCOUNTER — Telehealth: Payer: Self-pay | Admitting: Pharmacist

## 2021-01-30 NOTE — Chronic Care Management (AMB) (Signed)
    Chronic Care Management Pharmacy Assistant   Name: LEUL NARRAMORE  MRN: [id number] DOB: February 14, 1932  Spoke with patient wife [name] and reminded her of patients appointment on 01/31/21 at 8:30am by phone with Jeni Salles the clinical pharmacist. Advised to have all medications as well as any blood pressure or blood glucose readings available for review. [name] verbalized understanding and thanked me for my call. Unable to ask questions.   [place]  Clinical Pharmacist Assistant [phone]

## 2021-01-31 ENCOUNTER — Ambulatory Visit (INDEPENDENT_AMBULATORY_CARE_PROVIDER_SITE_OTHER): Payer: Medicare Other | Admitting: Pharmacist

## 2021-01-31 DIAGNOSIS — I10 Essential (primary) hypertension: Secondary | ICD-10-CM

## 2021-01-31 DIAGNOSIS — E1149 Type 2 diabetes mellitus with other diabetic neurological complication: Secondary | ICD-10-CM | POA: Diagnosis not present

## 2021-01-31 DIAGNOSIS — L820 Inflamed seborrheic keratosis: Secondary | ICD-10-CM | POA: Diagnosis not present

## 2021-01-31 NOTE — Progress Notes (Signed)
Chronic Care Management Pharmacy Note  02/06/2021 Name:  Michael Johnston MRN:  [id number] DOB:  20-Aug-1932  Subjective: Michael Johnston is an 87 y.o. year old male who is a primary patient of Dorothyann Peng, NP.  The CCM team was consulted for assistance with disease management and care coordination needs.    Engaged with patient by telephone for follow up visit in response to provider referral for pharmacy case management and/or care coordination services.   Consent to Services:  The patient was given information about Chronic Care Management services, agreed to services, and gave verbal consent prior to initiation of services.  Please see initial visit note for detailed documentation.   Patient Care Team: Dorothyann Peng, NP as PCP - General (Family Medicine) Viona Gilmore, [facility] as Pharmacist (Pharmacist)  Recent office visits: 01/12/21 Dorothyann Peng, NP: Patient presented for DM follow up. No medication changes.  11/07/20 Ofilia Neas, LPN: Patient presented for AWV.  07/14/20 Dorothyann Peng, NP: Patient presented for annual exam. Referral placed for podiatry.  Recent consult visits: 09/21/20 Tresa Endo, MD (urology): Patient presented for BPH initial consult (last seen 3 years ago). No medication changes.  08/14/20 [name], MD (cardiology): Patient presented for CABG follow up. No changes.  08/09/20 Boneta Lucks, DPM (podiatry): Patient presented for right foot drop.  Hospital visits: 09/15/20 Patient presented to the ED for back pain.  Objective:  Lab Results  Component Value Date   CREATININE 0.78 07/14/2020   BUN 18 07/14/2020   GFR 109.05 03/23/2019   GFRNONAA 82 07/14/2020   GFRAA 95 07/14/2020   NA 139 07/14/2020   K 4.9 07/14/2020   CALCIUM 9.9 07/14/2020   CO2 28 07/14/2020   GLUCOSE 142 (H) 07/14/2020    Lab Results  Component Value Date/Time   HGBA1C 6.6 (A) 01/12/2021 09:28 AM   HGBA1C 6.5 (A) 06/20/2020 09:24 AM   HGBA1C 7.0 (H) 09/17/2019  09:18 AM   HGBA1C 7.1 (H) 03/23/2019 10:10 AM   HGBA1C 6.7 11/06/2018 09:25 AM   GFR 109.05 03/23/2019 10:10 AM   GFR 100.68 11/06/2018 09:43 AM   MICROALBUR <0.2 07/14/2020 10:03 AM   MICROALBUR <0.7 03/19/2018 08:51 AM    Last diabetic Eye exam:  Lab Results  Component Value Date/Time   HMDIABEYEEXA No Retinopathy 07/27/2018 09:10 AM    Last diabetic Foot exam: No results found for: HMDIABFOOTEX   Lab Results  Component Value Date   CHOL 128 07/14/2020   HDL 61 07/14/2020   LDLCALC 54 07/14/2020   TRIG 58 07/14/2020   CHOLHDL 2.1 07/14/2020    Hepatic Function Latest Ref Rng & Units 11/23/2020 07/14/2020 03/23/2019  Total Protein 6.0 - 8.3 g/dL 6.1 6.7 6.5  Albumin 3.5 - 5.2 g/dL 4.0 - 4.4  AST 0 - 37 U/L _0 ALT 0 - 53 U/L _1 Alk Phosphatase 39 - 117 U/L 61 - 63  Total Bilirubin 0.2 - 1.2 mg/dL 0.5 0.9 0.8  Bilirubin, Direct 0.0 - 0.3 mg/dL 0.1 - -    Lab Results  Component Value Date/Time   TSH 3.13 07/14/2020 10:03 AM   TSH 3.17 03/19/2018 08:51 AM    CBC Latest Ref Rng & Units 07/14/2020 09/07/2019 03/23/2019  WBC 3.8 - 10.8 Thousand/uL 4.5 4.9 4.4  Hemoglobin 13.2 - 17.1 g/dL 13.4 12.4(L) 13.2  Hematocrit 38.5 - 50.0 % 40.1 38.6(L) 39.0  Platelets 140 - 400 Thousand/uL 140 130(L) 140.0(L)    No results found  for: VD25OH  Clinical ASCVD: Yes  The ASCVD Risk score Mikey Bussing DC Jr., et al., 2013) failed to calculate for the following reasons:   The 2013 ASCVD risk score is only valid for ages 7 to 78    Depression screen PHQ 2/9 11/07/2020 07/14/2020 03/23/2019  Decreased Interest 0 0 0  Down, Depressed, Hopeless 0 0 -  PHQ - 2 Score 0 0 0     Social History   Tobacco Use  Smoking Status Former Smoker  . Packs/day: 1.50  . Years: 3.00  . Pack years: 4.50  . Types: Cigarettes  . Quit date: 12/09/1951  . Years since quitting: 69.2  Smokeless Tobacco Never Used   BP Readings from Last 3 Encounters:  01/12/21 120/70  10/25/20 118/62  09/15/20  (!) 157/104   Pulse Readings from Last 3 Encounters:  01/12/21 (!) 51  09/15/20 (!) 58  08/14/20 61   Wt Readings from Last 3 Encounters:  01/12/21 178 lb (80.7 kg)  10/25/20 184 lb (83.5 kg)  09/15/20 182 lb (82.6 kg)   BMI Readings from Last 3 Encounters:  01/12/21 24.83 kg/m  10/25/20 25.66 kg/m  09/15/20 25.38 kg/m    Assessment/Interventions: Review of patient past medical history, allergies, medications, health status, including review of consultants reports, laboratory and other test data, was performed as part of comprehensive evaluation and provision of chronic care management services.   SDOH:  (Social Determinants of Health) assessments and interventions performed: No  SDOH Screenings   Alcohol Screen: Low Risk   . Last Alcohol Screening Score (AUDIT): 0  Depression (PHQ2-9): Low Risk   . PHQ-2 Score: 0  Financial Resource Strain: Low Risk   . Difficulty of Paying Living Expenses: Not hard at all  Food Insecurity: No Food Insecurity  . Worried About Charity fundraiser in the Last Year: Never true  . Ran Out of Food in the Last Year: Never true  Housing: Low Risk   . Last Housing Risk Score: 0  Physical Activity: Inactive  . Days of Exercise per Week: 0 days  . Minutes of Exercise per Session: 0 min  Social Connections: Moderately Integrated  . Frequency of Communication with Friends and Family: More than three times a week  . Frequency of Social Gatherings with Friends and Family: Three times a week  . Attends Religious Services: More than 4 times per year  . Active Member of Clubs or Organizations: No  . Attends Archivist Meetings: Never  . Marital Status: Married  Stress: No Stress Concern Present  . Feeling of Stress : Not at all  Tobacco Use: Medium Risk  . Smoking Tobacco Use: Former Smoker  . Smokeless Tobacco Use: Never Used  Transportation Needs: No Transportation Needs  . Lack of Transportation (Medical): No  . Lack of  Transportation (Non-Medical): No    CCM Care Plan  Allergies  Allergen Reactions  . Levaquin [Levofloxacin In D5w] Swelling and Other (See Comments)    Eyes swollen  . Other Anaphylaxis and Other (See Comments)    Bee sting  . Metformin And Related Nausea Only    Medications Reviewed Today    Reviewed by Agnes Lawrence, CMA (Certified Medical Assistant) on 01/12/21 at [phone]  Med List Status: <None>  Medication Order Taking? Sig Documenting Provider Last Dose Status Informant  aspirin 81 MG tablet [id number] Yes Take 81 mg by mouth daily. [provider] Taking Active Family Member  atorvastatin (LIPITOR) 20 MG tablet [id number]  Yes TAKE 1 TABLET BY MOUTH EVERY DAY Nafziger, Tommi Rumps, NP Taking Active   cyanocobalamin (,VITAMIN B-12,) 1000 MCG/ML injection [id number] Yes INJECT 1ML INTRAMUSCULARLY every other week Dorothyann Peng, NP Taking Active   diclofenac Sodium (VOLTAREN) 1 % GEL [id number] Yes Apply 2 g topically 4 (four) times daily as needed. Margette Fast, MD Taking Active   EPINEPHrine 0.3 mg/0.3 mL IJ SOAJ injection [id number] Yes  [provider] Taking Active   ketoconazole (NIZORAL) 2 % shampoo [id number] Yes Use twice weekly Dorothyann Peng, NP Taking Active   metFORMIN (GLUCOPHAGE) 500 MG tablet [id number] Yes TAKE 1 TABLET BY MOUTH EVERY DAY WITH BREAKFAST Nafziger, Tommi Rumps, NP Taking Active   metoprolol tartrate (LOPRESSOR) 25 MG tablet [id number] Yes Take 1 tablet (25 mg total) by mouth 2 (two) times daily. Nafziger, Tommi Rumps, NP Taking Active   nitroGLYCERIN (NITROSTAT) 0.4 MG SL tablet [id number] Yes Place 1 tablet (0.4 mg total) under the tongue every 5 (five) minutes as needed for chest pain (3 doses max). Josue Hector, MD Taking Active   SYRINGE-NEEDLE, DISP, 3 ML (B-D 3CC LUER-LOK SYR 25GX1") 25G X 1" 3 ML MISC [id number] Yes USE AS DIRECTED Nafziger, Tommi Rumps, NP Taking Active   terbinafine (LAMISIL) 250 MG tablet [id number] Yes TAKE ONE TABLET BY MOUTH DAILY, NEEDS  LABS CHECKED AFTER 6 WEEKS [provider] Taking Active           Patient Active Problem List   Diagnosis Date Noted  . Combined forms of age-related cataract of left eye 11/10/2018  . Combined forms of age-related cataract of right eye 11/03/2018  . Intraoperative floppy iris syndrome (IFIS) 11/03/2018  . Urine stream spraying 02/23/2018  . Flank pain 02/22/2018  . Chest pain 08/21/2017  . Unstable angina ([place]) 08/21/2017  . Renal cyst 12/20/2016  . B12 deficiency 11/08/2016  . Type 2 diabetes mellitus with neurological complications ([place]) 54/27/0623  . Syncope and collapse 05/24/2015  . Benign prostatic hyperplasia with urinary obstruction 12/12/2014  . Carotid artery stenosis 05/22/2012  . CAD (coronary artery disease) 05/14/2012  . ED (erectile dysfunction) of organic origin 08/19/2011  . FH: CABG (coronary artery bypass surgery) 01/28/2011  . Mixed hyperlipidemia 10/23/2010  . GERD (gastroesophageal reflux disease) 10/22/2010  . History of colonic polyps 06/14/2010    Immunization History  Administered Date(s) Administered  . Fluad Quad(high Dose 65+) 06/07/2019, 06/20/2020  . Influenza Inj Mdck Quad Pf 06/07/2019  . Influenza Split 07/05/2011, 05/22/2012, 07/28/2013  . Influenza Whole 06/14/2010  . Influenza, High Dose Seasonal PF 06/25/2014, 06/03/2016, 06/02/2017, 07/02/2018  . Influenza,inj,Quad PF,6+ Mos 07/02/2018  . Influenza-Unspecified 05/18/2015, 06/02/2017  . Pneumococcal Conjugate-13 09/14/2013  . Pneumococcal Polysaccharide-23 09/09/2005, 07/05/2011  . Td 09/10/2003  . Tetanus 09/14/2013   Patient had vertigo over the past few the few weeks and fell in the house a couple weeks ago and bruised his shoulder and his arm. He denied feeling dizzy or lightheaded and fell out of a chair when reaching too far.  Conditions to be addressed/monitored:  Hypertension, Hyperlipidemia, Diabetes, Coronary Artery Disease, GERD and vitamin B12  deficiency  Care Plan : CCM Pharmacy Care Plan  Updates made by Viona Gilmore, [place] since 02/06/2021 12:00 AM    Problem: Problem: Hypertension, Hyperlipidemia, Diabetes, Coronary Artery Disease, GERD and vitamin B12 deficiency     Long-Range Goal: Patient-Specific Goal   Start Date: 01/31/2021  Expected End Date: 01/31/2022  This Visit's Progress: On track  Priority: High  Note:   Current  Barriers:  . Unable to independently monitor therapeutic efficacy  Pharmacist Clinical Goal(s):  Marland Kitchen Patient will achieve adherence to monitoring guidelines and medication adherence to achieve therapeutic efficacy through collaboration with PharmD and provider.   Interventions: . 1:1 collaboration with Dorothyann Peng, NP regarding development and update of comprehensive plan of care as evidenced by provider attestation and co-signature . Inter-disciplinary care team collaboration (see longitudinal plan of care) . Comprehensive medication review performed; medication list updated in electronic medical record  Hypertension (BP goal <140/90) -Controlled -Current treatment: . Metoprolol tartrate 25 mg 1 tablet twice daily AM and PM -Medications previously tried: none  -Current home readings: 130s, 147/63; none > 150 (keeping a log) -Current dietary habits: did not discuss -Current exercise habits: did not discuss -Denies hypotensive/hypertensive symptoms -Educated on Exercise goal of 150 minutes per week; Importance of home blood pressure monitoring; Proper BP monitoring technique; -Counseled to monitor BP at home weekly, document, and provide log at future appointments -Recommended to continue current medication  CAD (Goal: prevent heart events) -Controlled -Current treatment   Metoprolol tartrate 25 mg 1 tablet twice daily AM and PM - 1/2 tablet twice daily  Nitrostat 0.4 mg SL 1 tablet under tongue every 5 mins PRN for chest pain. 3 doses max  Aspirin 81 mg 1 tablet daily -Medications  previously tried: none  -Recommended to continue current medication   Hyperlipidemia: (LDL goal < 70) -Controlled -Current treatment:  Atorvastatin 20 mg 1 tablet daily at bedtime -Medications previously tried: simvastatin -Current dietary patterns: did not discuss -Current exercise habits: did not discuss -Educated on Cholesterol goals;  Benefits of statin for ASCVD risk reduction; Importance of limiting foods high in cholesterol; -Counseled on diet and exercise extensively Recommended to continue current medication  Diabetes (A1c goal <7%) -Controlled -Current medications: Marland Kitchen Metformin 500 mg 1 tablet daily with breakfast -Medications previously tried: none  -Current home glucose readings . fasting glucose: 147 (checks once a month) . post prandial glucose: n/a -Denies hypoglycemic/hyperglycemic symptoms -Current meal patterns:  . breakfast: did not discuss  . lunch: did not discuss   . dinner: did not discuss  . snacks: did not discuss  . drinks: did not discuss  -Current exercise: did not discuss  -Educated on A1c and blood sugar goals; Exercise goal of 150 minutes per week; Carbohydrate counting and/or plate method -Counseled to check feet daily and get yearly eye exams -Counseled on diet and exercise extensively Recommended to continue current medication  GERD (Goal: minimize symptoms of heartburn) -Controlled -Current treatment  . Alka seltzer as needed -Medications previously tried: omeprazole -Recommended to continue current medication Counseled on non-pharmacologic management of symptoms such as elevating the head of your bed, avoiding eating 2-3 hours before bed, avoiding triggering foods such as acidic, spicy, or fatty foods, eating smaller meals, and wearing clothes that are loose around the waist  Vitamin B12 deficiency (Goal: 211-911) -Controlled -Current treatment  . Vitamin B12 1000 mcg/ml inject 1 ml once a month -Medications previously tried:  none  - Vitamin B12 level is normal. Patient reports daughter in law coming to the house once a month to do his shot.   Health Maintenance -Vaccine gaps: shingrix -Current therapy:   Epipen 0.43m/0.3 ml inject as needed for anaphylaxis  Ketoconazole 2% shampoo apply every other day to scalp - lather for 5 minutes then rinse out -Educated on Cost vs benefit of each product must be carefully weighed by individual consumer -Patient is satisfied with current therapy and denies  issues -Recommended to continue current medication  Patient Goals/Self-Care Activities . Patient will:  - take medications as prescribed check glucose a few times a month, document, and provide at future appointments check blood pressure weekly, document, and provide at future appointments  Follow Up Plan: Telephone follow up appointment with care management team member scheduled for:6 months       Medication Assistance: None required.  Patient affirms current coverage meets needs.  Patient's [facility] pharmacy [facility]:  [id number] [id number]: [phone]: [phone] Uses pill box? Yes - spouse helps with medications Pt endorses 100% compliance  We discussed: Current pharmacy is preferred with insurance plan and patient is satisfied with pharmacy services Patient decided to: Continue current medication management strategy  Patient has been compliant with his medications and [facility]. Introduced to him the option to on board with [facility], but he states that he is happy with the service he gets with his pharmacy now.  Care Plan and Follow Up Patient Decision:  Patient agrees to Care Plan and Follow-up.  Plan: Telephone follow up appointment with care management team member scheduled for:  6 months  MJeni Salles PharmD BCliftonPharmacist LShamokinat  [phone]

## 2021-02-01 DIAGNOSIS — M9903 Segmental and somatic dysfunction of lumbar region: Secondary | ICD-10-CM | POA: Diagnosis not present

## 2021-02-01 DIAGNOSIS — M5136 Other intervertebral disc degeneration, lumbar region: Secondary | ICD-10-CM | POA: Diagnosis not present

## 2021-02-06 DIAGNOSIS — M9903 Segmental and somatic dysfunction of lumbar region: Secondary | ICD-10-CM | POA: Diagnosis not present

## 2021-02-06 DIAGNOSIS — M5136 Other intervertebral disc degeneration, lumbar region: Secondary | ICD-10-CM | POA: Diagnosis not present

## 2021-02-06 NOTE — Patient Instructions (Signed)
Hi Juvon,  It was great speaking with you again! Below is a summary of some of the topics we discussed.   Please reach out to me if you have any questions or need anything before our follow up!  Best, Maddie  Jeni Salles, PharmD, [facility]  Visit Information  Goals Addressed   None    Patient Care Plan: CCM Pharmacy Care Plan    Problem Identified: Problem: Hypertension, Hyperlipidemia, Diabetes, Coronary Artery Disease, GERD and vitamin B12 deficiency     Long-Range Goal: Patient-Specific Goal   Start Date: 01/31/2021  Expected End Date: 01/31/2022  This Visit's Progress: On track  Priority: High  Note:   Current Barriers:  . Unable to independently monitor therapeutic efficacy  Pharmacist Clinical Goal(s):  Marland Kitchen Patient will achieve adherence to monitoring guidelines and medication adherence to achieve therapeutic efficacy through collaboration with PharmD and provider.   Interventions: . 1:1 collaboration with Dorothyann Peng, NP regarding development and update of comprehensive plan of care as evidenced by provider attestation and co-signature . Inter-disciplinary care team collaboration (see longitudinal plan of care) . Comprehensive medication review performed; medication list updated in electronic medical record  Hypertension (BP goal <140/90) -Controlled -Current treatment: . Metoprolol tartrate 25 mg 1 tablet twice daily AM and PM -Medications previously tried: none  -Current home readings: 130s, 147/63; none > 150 (keeping a log) -Current dietary habits: did not discuss -Current exercise habits: did not discuss -Denies hypotensive/hypertensive symptoms -Educated on Exercise goal of 150 minutes per week; Importance of home blood pressure monitoring; Proper BP monitoring technique; -Counseled to monitor BP at home weekly, document, and provide log at future appointments -Recommended to continue  current medication  CAD (Goal: prevent heart events) -Controlled -Current treatment   Metoprolol tartrate 25 mg 1 tablet twice daily AM and PM - 1/2 tablet twice daily  Nitrostat 0.4 mg SL 1 tablet under tongue every 5 mins PRN for chest pain. 3 doses max  Aspirin 81 mg 1 tablet daily -Medications previously tried: none  -Recommended to continue current medication   Hyperlipidemia: (LDL goal < 70) -Controlled -Current treatment:  Atorvastatin 20 mg 1 tablet daily at bedtime -Medications previously tried: simvastatin -Current dietary patterns: did not discuss -Current exercise habits: did not discuss -Educated on Cholesterol goals;  Benefits of statin for ASCVD risk reduction; Importance of limiting foods high in cholesterol; -Counseled on diet and exercise extensively Recommended to continue current medication  Diabetes (A1c goal <7%) -Controlled -Current medications: Marland Kitchen Metformin 500 mg 1 tablet daily with breakfast -Medications previously tried: none  -Current home glucose readings . fasting glucose: 147 (checks once a month) . post prandial glucose: n/a -Denies hypoglycemic/hyperglycemic symptoms -Current meal patterns:  . breakfast: did not discuss  . lunch: did not discuss   . dinner: did not discuss  . snacks: did not discuss  . drinks: did not discuss  -Current exercise: did not discuss  -Educated on A1c and blood sugar goals; Exercise goal of 150 minutes per week; Carbohydrate counting and/or plate method -Counseled to check feet daily and get yearly eye exams -Counseled on diet and exercise extensively Recommended to continue current medication  GERD (Goal: minimize symptoms of heartburn) -Controlled -Current treatment  . Alka seltzer as needed -Medications previously tried: omeprazole -Recommended to continue current medication Counseled on non-pharmacologic management of symptoms such as elevating the head of your bed, avoiding eating 2-3 hours  before bed, avoiding triggering foods such as acidic, spicy,  or fatty foods, eating smaller meals, and wearing clothes that are loose around the waist  Vitamin B12 deficiency (Goal: 211-911) -Controlled -Current treatment  . Vitamin B12 1000 mcg/ml inject 1 ml once a month -Medications previously tried: none  - Vitamin B12 level is normal. Patient reports daughter in law coming to the house once a month to do his shot.   Health Maintenance -Vaccine gaps: shingrix -Current therapy:   Epipen 0.3mg /0.3 ml inject as needed for anaphylaxis  Ketoconazole 2% shampoo apply every other day to scalp - lather for 5 minutes then rinse out -Educated on Cost vs benefit of each product must be carefully weighed by individual consumer -Patient is satisfied with current therapy and denies issues -Recommended to continue current medication  Patient Goals/Self-Care Activities . Patient will:  - take medications as prescribed check glucose a few times a month, document, and provide at future appointments check blood pressure weekly, document, and provide at future appointments  Follow Up Plan: Telephone follow up appointment with care management team member scheduled for:6 months       Patient verbalizes understanding of instructions provided today and agrees to view in [place].  Telephone follow up appointment with pharmacy team member scheduled for: 6 months  Viona Gilmore, [facility]

## 2021-02-13 DIAGNOSIS — M5136 Other intervertebral disc degeneration, lumbar region: Secondary | ICD-10-CM | POA: Diagnosis not present

## 2021-02-13 DIAGNOSIS — M9903 Segmental and somatic dysfunction of lumbar region: Secondary | ICD-10-CM | POA: Diagnosis not present

## 2021-03-06 DIAGNOSIS — M9903 Segmental and somatic dysfunction of lumbar region: Secondary | ICD-10-CM | POA: Diagnosis not present

## 2021-03-06 DIAGNOSIS — M5136 Other intervertebral disc degeneration, lumbar region: Secondary | ICD-10-CM | POA: Diagnosis not present

## 2021-04-03 DIAGNOSIS — M9903 Segmental and somatic dysfunction of lumbar region: Secondary | ICD-10-CM | POA: Diagnosis not present

## 2021-04-03 DIAGNOSIS — M5136 Other intervertebral disc degeneration, lumbar region: Secondary | ICD-10-CM | POA: Diagnosis not present

## 2021-04-17 ENCOUNTER — Telehealth: Payer: Self-pay | Admitting: Pharmacist

## 2021-04-17 NOTE — Chronic Care Management (AMB) (Signed)
Chronic Care Management Pharmacy Assistant   Name: Michael Johnston  MRN: [id number] DOB: 23-Oct-1931   Reason for Encounter: Disease State Hypertension Assessment Call   Conditions to be addressed/monitored: HTN  Recent office visits:  None  Recent consult visits:  None  Hospital visits:  None in previous 6 months  Medications: Outpatient Encounter Medications as of 04/17/2021  Medication Sig   aspirin 81 MG tablet Take 81 mg by mouth daily.   atorvastatin (LIPITOR) 20 MG tablet TAKE 1 TABLET BY MOUTH EVERY DAY   cyanocobalamin (,VITAMIN B-12,) 1000 MCG/ML injection INJECT 1ML INTRAMUSCULARLY every other week   diclofenac Sodium (VOLTAREN) 1 % GEL Apply 2 g topically 4 (four) times daily as needed.   EPINEPHrine 0.3 mg/0.3 mL IJ SOAJ injection    ketoconazole (NIZORAL) 2 % shampoo Use twice weekly   metFORMIN (GLUCOPHAGE) 500 MG tablet Take 1 tablet (500 mg total) by mouth daily with breakfast.   metoprolol tartrate (LOPRESSOR) 25 MG tablet Take 1 tablet (25 mg total) by mouth 2 (two) times daily.   nitroGLYCERIN (NITROSTAT) 0.4 MG SL tablet Place 1 tablet (0.4 mg total) under the tongue every 5 (five) minutes as needed for chest pain (3 doses max).   SYRINGE-NEEDLE, DISP, 3 ML (B-D 3CC LUER-LOK SYR 25GX1") 25G X 1" 3 ML MISC USE AS DIRECTED   terbinafine (LAMISIL) 250 MG tablet TAKE ONE TABLET BY MOUTH DAILY, NEEDS LABS CHECKED AFTER 6 WEEKS (Patient not taking: Reported on 01/31/2021)   No facility-administered encounter medications on file as of 04/17/2021.   Reviewed chart prior to disease state call. Spoke with patient regarding BP  Recent Office Vitals: BP Readings from Last 3 Encounters:  01/12/21 120/70  10/25/20 118/62  09/15/20 (!) 157/104   Pulse Readings from Last 3 Encounters:  01/12/21 (!) 51  09/15/20 (!) 58  08/14/20 61    Wt Readings from Last 3 Encounters:  01/12/21 178 lb (80.7 kg)  10/25/20 184 lb (83.5 kg)  09/15/20 182 lb (82.6 kg)      Kidney Function Lab Results  Component Value Date/Time   CREATININE 0.78 07/14/2020 10:03 AM   CREATININE 0.76 09/07/2019 06:30 AM   CREATININE 0.69 03/23/2019 10:10 AM   GFR 109.05 03/23/2019 10:10 AM   GFRNONAA 82 07/14/2020 10:03 AM   GFRAA 95 07/14/2020 10:03 AM    BMP Latest Ref Rng & Units 07/14/2020 09/07/2019 03/23/2019  Glucose 65 - 99 mg/dL 142(H) 153(H) 140(H)  BUN 7 - 25 mg/dL 18 26(H) 22  Creatinine 0.70 - 1.11 mg/dL 0.78 0.76 0.69  BUN/Creat Ratio 6 - 22 (calc) NOT APPLICABLE - -  Sodium A999333 - 146 mmol/L 139 141 138  Potassium 3.5 - 5.3 mmol/L 4.9 4.6 4.6  Chloride 98 - 110 mmol/L 103 104 103  CO2 20 - 32 mmol/L '28 28 28  '$ Calcium 8.6 - 10.3 mg/dL 9.9 9.2 9.2   Spoke to patients wife [name] for the Call  Current antihypertensive regimen:  Metoprolol tartrate 25 mg 1 tablet twice daily AM and PM How often are you checking your Blood Pressure? daily She reports daily Current home BP readings: 8-8 (132/64) 04-15-21 (134/62) What recent interventions/DTPs have been made by any provider to improve Blood Pressure control since last CPP Visit: She reports none  Any recent hospitalizations or ED visits since last visit with CPP? No What diet changes have been made to improve Blood Pressure Control?  She reports his appetite is very good she prepares all  of the meals for him at home they do not eat out. He eats a good breakfast and for lunch he has leftovers and she cooks dinner usually daily. She is watching his sodium intake What exercise is being done to improve your Blood Pressure Control?  She reports he is very busy and active in the garden as well as working in things in is shop.  She advised they are not in need of any fills on any medications including his Metoprolol. She states no concerns or issues at this time.   Adherence Review: Is the patient currently on ACE/ARB medication? No Does the patient have >5 day gap between last estimated fill dates? No  Care  Gaps: AWV done 11-07-2020 COVID Vaccines - Overdue Zoster Vaccine- Overdue Flu Vaccine - Overdue   Star Rating Drugs: Metformin 500 mg - Last filled 02-24-2021 [facility] Atorvastatin 20 mg - Last filled 02-24-2021 90 DS at [facility] Pharmacist Assistant [phone]

## 2021-05-18 ENCOUNTER — Other Ambulatory Visit: Payer: Self-pay | Admitting: Adult Health

## 2021-05-21 ENCOUNTER — Other Ambulatory Visit: Payer: Self-pay | Admitting: Adult Health

## 2021-05-21 DIAGNOSIS — E538 Deficiency of other specified B group vitamins: Secondary | ICD-10-CM

## 2021-07-16 ENCOUNTER — Other Ambulatory Visit: Payer: Self-pay

## 2021-07-17 ENCOUNTER — Ambulatory Visit (INDEPENDENT_AMBULATORY_CARE_PROVIDER_SITE_OTHER): Payer: Medicare Other | Admitting: Adult Health

## 2021-07-17 ENCOUNTER — Encounter: Payer: Self-pay | Admitting: Adult Health

## 2021-07-17 VITALS — BP 128/60 | HR 60 | Temp 97.6°F | Ht 70.0 in | Wt 177.0 lb

## 2021-07-17 DIAGNOSIS — E538 Deficiency of other specified B group vitamins: Secondary | ICD-10-CM

## 2021-07-17 DIAGNOSIS — Z0000 Encounter for general adult medical examination without abnormal findings: Secondary | ICD-10-CM

## 2021-07-17 DIAGNOSIS — N138 Other obstructive and reflux uropathy: Secondary | ICD-10-CM | POA: Diagnosis not present

## 2021-07-17 DIAGNOSIS — E782 Mixed hyperlipidemia: Secondary | ICD-10-CM | POA: Diagnosis not present

## 2021-07-17 DIAGNOSIS — I10 Essential (primary) hypertension: Secondary | ICD-10-CM

## 2021-07-17 DIAGNOSIS — E1149 Type 2 diabetes mellitus with other diabetic neurological complication: Secondary | ICD-10-CM | POA: Diagnosis not present

## 2021-07-17 DIAGNOSIS — I2510 Atherosclerotic heart disease of native coronary artery without angina pectoris: Secondary | ICD-10-CM

## 2021-07-17 DIAGNOSIS — I700 Atherosclerosis of aorta: Secondary | ICD-10-CM | POA: Diagnosis not present

## 2021-07-17 DIAGNOSIS — N401 Enlarged prostate with lower urinary tract symptoms: Secondary | ICD-10-CM

## 2021-07-17 LAB — COMPREHENSIVE METABOLIC PANEL
ALT: 19 U/L (ref 0–53)
AST: 18 U/L (ref 0–37)
Albumin: 4.3 g/dL (ref 3.5–5.2)
Alkaline Phosphatase: 60 U/L (ref 39–117)
BUN: 23 mg/dL (ref 6–23)
CO2: 30 mEq/L (ref 19–32)
Calcium: 9.5 mg/dL (ref 8.4–10.5)
Chloride: 103 mEq/L (ref 96–112)
Creatinine, Ser: 0.82 mg/dL (ref 0.40–1.50)
GFR: 79.24 mL/min (ref >60.00)
Glucose, Bld: 150 mg/dL — ABNORMAL HIGH (ref 70–99)
Potassium: 4.9 mEq/L (ref 3.5–5.1)
Sodium: 139 mEq/L (ref 135–145)
Total Bilirubin: 0.7 mg/dL (ref 0.2–1.2)
Total Protein: 6.9 g/dL (ref 6.0–8.3)

## 2021-07-17 LAB — CBC WITH DIFFERENTIAL/PLATELET
Basophils Absolute: 0.1 10*3/uL (ref 0.0–0.1)
Basophils Relative: 1.5 % (ref 0.0–3.0)
Eosinophils Absolute: 0.1 10*3/uL (ref 0.0–0.7)
Eosinophils Relative: 3.3 % (ref 0.0–5.0)
HCT: 38.7 % — ABNORMAL LOW (ref 39.0–52.0)
Hemoglobin: 12.9 g/dL — ABNORMAL LOW (ref 13.0–17.0)
Lymphocytes Relative: 35.7 % (ref 12.0–46.0)
Lymphs Abs: 1.4 10*3/uL (ref 0.7–4.0)
MCHC: 33.5 g/dL (ref 30.0–36.0)
MCV: 97.3 fl (ref 78.0–100.0)
Monocytes Absolute: 0.4 10*3/uL (ref 0.1–1.0)
Monocytes Relative: 9 % (ref 3.0–12.0)
Neutro Abs: 2 10*3/uL (ref 1.4–7.7)
Neutrophils Relative %: 50.5 % (ref 43.0–77.0)
Platelets: 114 10*3/uL — ABNORMAL LOW (ref 150.0–400.0)
RBC: 3.98 Mil/uL — ABNORMAL LOW (ref 4.22–5.81)
RDW: 13.6 % (ref 11.5–15.5)
WBC: 3.9 10*3/uL — ABNORMAL LOW (ref 4.0–10.5)

## 2021-07-17 LAB — LIPID PANEL
Cholesterol: 149 mg/dL (ref 0–200)
HDL: 64.7 mg/dL (ref >39.00)
LDL Cholesterol: 70 mg/dL (ref 0–99)
NonHDL: 84.25
Total CHOL/HDL Ratio: 2
Triglycerides: 71 mg/dL (ref 0.0–149.0)
VLDL: 14.2 mg/dL (ref 0.0–40.0)

## 2021-07-17 LAB — MICROALBUMIN / CREATININE URINE RATIO
Creatinine,U: 60 mg/dL
Microalb Creat Ratio: 1.2 mg/g (ref 0.0–30.0)
Microalb, Ur: <0.7 mg/dL (ref 0.0–1.9)

## 2021-07-17 LAB — VITAMIN B12: Vitamin B-12: 650 pg/mL (ref 211–911)

## 2021-07-17 LAB — HEMOGLOBIN A1C: Hgb A1c MFr Bld: 6.9 % — ABNORMAL HIGH (ref 4.6–6.5)

## 2021-07-17 LAB — TSH: TSH: 6.84 u[IU]/mL — ABNORMAL HIGH (ref 0.35–5.50)

## 2021-07-17 MED ORDER — METFORMIN HCL 500 MG PO TABS: 500.0000 mg | ORAL_TABLET | Freq: Every day | ORAL | 1 refills | Status: DC

## 2021-07-17 MED ORDER — ATORVASTATIN CALCIUM 20 MG PO TABS: ORAL_TABLET | ORAL | 3 refills | Status: DC

## 2021-07-17 MED ORDER — METOPROLOL TARTRATE 25 MG PO TABS
25.0000 mg | ORAL_TABLET | Freq: Two times a day (BID) | ORAL | 3 refills | Status: DC
Start: 2021-07-17 — End: 2022-08-20

## 2021-07-17 NOTE — Progress Notes (Signed)
Subjective:    Patient ID: Michael Johnston, male    DOB: Dec 12, 1931, 87 y.o.   MRN: [id number]  HPI Patient presents for yearly preventative medicine examination. He is a pleasant 87 year old male who  has a past medical history of Arthritis, CHEST PAIN, Chronic bronchitis ([place]), COLONIC POLYPS, HX OF, COPD (chronic obstructive pulmonary disease) ([place]), Coronary artery disease, Cystic kidney disease, GERD (gastroesophageal reflux disease), History of hiatal hernia, MI (myocardial infarction) ([place]) (09/2010), Mixed hyperlipidemia, Skin cancer, Sleep apnea, and Syncope and collapse (~ 2013; 05/24/2015).  Diabetes mellitus type 2-prescribed metformin 500 mg daily.  He denies nausea, vomiting, diarrhea, or episodes of hypoglycemia.  He does not check his blood sugars at home on a routine basis Lab Results  Component Value Date   HGBA1C 6.6 (A) 01/12/2021   Hypertension-managed with metoprolol 25 mg twice daily.  He denies dizziness, lightheadedness, chest pain, or shortness of breath.  He does not check his blood pressures at home  BP Readings from Last 3 Encounters:  01/12/21 120/70  10/25/20 118/62  09/15/20 (!) 157/104   CAD s/p CABG -followed by cardiology.  Prescribe Lipitor 20 mg daily, aspirin 81 mg, metoprolol twice daily.  He just denies chest pain, shortness of breath, claudication symptoms, or palpitations.  Hyperlipidemia-prescribed Lipitor 20 mg daily and aspirin 81 mg.  He denies myalgia or fatigue Lab Results  Component Value Date   CHOL 128 07/14/2020   HDL 61 07/14/2020   LDLCALC 54 07/14/2020   TRIG 58 07/14/2020   CHOLHDL 2.1 07/14/2020    Vitamin B-12 deficiency-receives monthly B12 injections Lab Results  Component Value Date   [id number] 124 06/20/2020   BPH-managed by urology   All immunizations and health maintenance protocols were reviewed with the patient and needed orders were placed.  Appropriate screening laboratory values were ordered for the  patient including screening of hyperlipidemia, renal function and hepatic function.  Medication reconciliation,  past medical history, social history, problem list and allergies were reviewed in detail with the patient  Goals were established with regard to weight loss, exercise, and  diet in compliance with medications  Review of Systems  Constitutional: Negative.   HENT: Negative.    Eyes: Negative.   Respiratory: Negative.    Cardiovascular: Negative.   Gastrointestinal: Negative.   Endocrine: Negative.   Genitourinary: Negative.   Musculoskeletal: Negative.   Skin: Negative.   Allergic/Immunologic: Negative.   Neurological: Negative.   Hematological: Negative.   Psychiatric/Behavioral: Negative.    All other systems reviewed and are negative. Past Medical History:  Diagnosis Date   Arthritis    "right shoulder" (05/24/2015)   CHEST PAIN    Chronic bronchitis ([place])    "get it ~ q yr" (05/24/2015)   COLONIC POLYPS, HX OF    COPD (chronic obstructive pulmonary disease) ([place])    "dx'd but I don't take RX for it" (05/24/2015)   Coronary artery disease    Cystic kidney disease    GERD (gastroesophageal reflux disease)    History of hiatal hernia    MI (myocardial infarction) ([place]) 09/2010   Mixed hyperlipidemia    Skin cancer    "top of my head only" (05/24/2015)   Sleep apnea    Syncope and collapse ~ 2013; 05/24/2015    Social History   Socioeconomic History   Marital status: Married    Spouse name: Not on file   Number of children: Not on file   Years of education: Not on  file   Highest education level: Not on file  Occupational History   Not on file  Tobacco Use   Smoking status: Former    Packs/day: 1.50    Years: 3.00    Pack years: 4.50    Types: Cigarettes    Quit date: 12/09/1951    Years since quitting: 69.6   Smokeless tobacco: Never  Vaping Use   Vaping Use: Never used  Substance and Sexual Activity   Alcohol use: Not Currently    Comment: "no  alcohol since 1953"   Drug use: No   Sexual activity: Not on file  Other Topics Concern   Not on file  Social History Narrative   Retired    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[place] Resource Strain: Low Risk    Difficulty of Paying Living Expenses: Not hard at all  Food Insecurity: No Food Insecurity   Worried About Charity fundraiser in the Last Year: Never true   Arboriculturist in the Last Year: Never true  Transportation Needs: Not on file  Physical Activity: Inactive   Days of Exercise per Week: 0 days   Minutes of Exercise per Session: 0 min  Stress: No Stress Concern Present   Feeling of Stress : Not at all  Social Connections: Moderately Integrated   Frequency of Communication with Friends and Family: More than three times a week   Frequency of Social Gatherings with Friends and Family: Three times a week   Attends Religious Services: More than 4 times per year   Active Member of Clubs or Organizations: No   Attends Archivist Meetings: Never   Marital Status: Married  Human resources officer Violence: Not At Risk   Fear of Current or Ex-Partner: No   Emotionally Abused: No   Physically Abused: No   Sexually Abused: No    Past Surgical History:  Procedure Laterality Date   CARDIAC CATHETERIZATION  09/2010   [place]   "partial"   CORONARY ANGIOPLASTY     CORONARY ARTERY BYPASS GRAFT  09/2010   Median sternotomy for coronary artery bypass grafting x3  (left internal mammary artery to distal left anterior  descending  coronary artery, saphenous vein graft to first diagonal branch,  saphenous vein graft to first  obtuse marginal branch, endoscopic  saphenous vein harvest from right thigh). SURGEON:  Valentina Gu.  Roxy Manns, MD  ASSISTANT:  John Giovanni, PA-C  ANESTHESIA:  Glynda Jaeger, MD    LEFT HEART CATH AND CORS/GRAFTS ANGIOGRAPHY N/A 08/22/2017   Procedure: LEFT HEART CATH AND  CORS/GRAFTS ANGIOGRAPHY;  Surgeon: Martinique, Peter M, MD;  Location: [facility];  Service: Cardiovascular;  Laterality: N/A;   SKIN CANCER EXCISION     "top of my head"    Family History  Problem Relation Age of Onset   Lung cancer Sister    Heart attack Father     Allergies  Allergen Reactions   Levaquin [Levofloxacin In D5w] Swelling and Other (See Comments)    Eyes swollen   Other Anaphylaxis and Other (See Comments)    Bee sting   Metformin And Related Nausea Only    Current Outpatient Medications on File Prior to Visit  Medication Sig Dispense Refill   aspirin 81 MG tablet Take 81 mg by mouth daily.     atorvastatin (LIPITOR) 20 MG tablet TAKE  1 TABLET BY MOUTH EVERY DAY 90 tablet 3   cyanocobalamin (,VITAMIN B-12,) 1000 MCG/ML injection INJECT 1ML INTRAMUSCULARLY every other week 3 mL 6   diclofenac Sodium (VOLTAREN) 1 % GEL Apply 2 g topically 4 (four) times daily as needed. 50 g 0   EPINEPHrine 0.3 mg/0.3 mL IJ SOAJ injection      ketoconazole (NIZORAL) 2 % shampoo USE TWICE WEEKLY 120 mL 3   metFORMIN (GLUCOPHAGE) 500 MG tablet Take 1 tablet (500 mg total) by mouth daily with breakfast. 90 tablet 1   metoprolol tartrate (LOPRESSOR) 25 MG tablet Take 1 tablet (25 mg total) by mouth 2 (two) times daily. 180 tablet 3   nitroGLYCERIN (NITROSTAT) 0.4 MG SL tablet Place 1 tablet (0.4 mg total) under the tongue every 5 (five) minutes as needed for chest pain (3 doses max). 25 tablet 1   SYRINGE-NEEDLE, DISP, 3 ML (B-D 3CC LUER-LOK SYR 25GX1") 25G X 1" 3 ML MISC USE AS DIRCTED TO INJECT B12 3 each 3   terbinafine (LAMISIL) 250 MG tablet TAKE ONE TABLET BY MOUTH DAILY, NEEDS LABS CHECKED AFTER 6 WEEKS (Patient not taking: Reported on 01/31/2021)     No current facility-administered medications on file prior to visit.    There were no vitals taken for this visit.      Objective:   Physical Exam Vitals and nursing note reviewed.  Constitutional:      General: He is not  in acute distress.    Appearance: Normal appearance. He is well-developed and normal weight.  HENT:     Head: Normocephalic and atraumatic.     Right Ear: Tympanic membrane, ear canal and external ear normal. There is no impacted cerumen.     Left Ear: Tympanic membrane, ear canal and external ear normal. There is no impacted cerumen.     Nose: Nose normal. No congestion or rhinorrhea.     Mouth/Throat:     Mouth: Mucous membranes are moist.     Pharynx: Oropharynx is clear. No oropharyngeal exudate or posterior oropharyngeal erythema.  Eyes:     General:        Right eye: No discharge.        Left eye: No discharge.     Extraocular Movements: Extraocular movements intact.     Conjunctiva/sclera: Conjunctivae normal.     Pupils: Pupils are equal, round, and reactive to light.  Neck:     Vascular: No carotid bruit.     Trachea: No tracheal deviation.  Cardiovascular:     Rate and Rhythm: Normal rate and regular rhythm.     Pulses: Normal pulses.     Heart sounds: Normal heart sounds. No murmur heard.   No friction rub. No gallop.  Pulmonary:     Effort: Pulmonary effort is normal. No respiratory distress.     Breath sounds: Normal breath sounds. No stridor. No wheezing, rhonchi or rales.  Chest:     Chest wall: No tenderness.  Abdominal:     General: Bowel sounds are normal. There is no distension.     Palpations: Abdomen is soft. There is no mass.     Tenderness: There is no abdominal tenderness. There is no right CVA tenderness, left CVA tenderness, guarding or rebound.     Hernia: No hernia is present.  Musculoskeletal:        General: No swelling, tenderness, deformity or signs of injury. Normal range of motion.     Right lower leg: No edema.  Left lower leg: No edema.  Lymphadenopathy:     Cervical: No cervical adenopathy.  Skin:    General: Skin is warm and dry.     Capillary Refill: Capillary refill takes less than 2 seconds.     Coloration: Skin is not jaundiced  or pale.     Findings: No bruising, erythema, lesion or rash.  Neurological:     General: No focal deficit present.     Mental Status: He is alert and oriented to person, place, and time.     Cranial Nerves: No cranial nerve deficit.     Sensory: No sensory deficit.     Motor: No weakness.     Coordination: Coordination normal.     Gait: Gait normal.     Deep Tendon Reflexes: Reflexes normal.  Psychiatric:        Mood and Affect: Mood normal.        Behavior: Behavior normal.        Thought Content: Thought content normal.        Judgment: Judgment normal.      Assessment & Plan:  1. Routine general medical examination at a health care facility - Remarkable 87 year old male  - Follow up in one year or sooner if needed - CBC with Differential/Platelet; Future - Comprehensive metabolic panel; Future - Hemoglobin A1c; Future - Lipid panel; Future - TSH; Future - Microalbumin/Creatinine Ratio, Urine; Future  2. Essential hypertension - well controlled.  - No change in medications  - CBC with Differential/Platelet; Future - Comprehensive metabolic panel; Future - Hemoglobin A1c; Future - Lipid panel; Future - TSH; Future - Microalbumin/Creatinine Ratio, Urine; Future - metoprolol tartrate (LOPRESSOR) 25 MG tablet; Take 1 tablet (25 mg total) by mouth 2 (two) times daily.  Dispense: 180 tablet; Refill: 3  3. Type 2 diabetes mellitus with neurological complications (HCC) - Follow up in 6 months or sooner if needed - CBC with Differential/Platelet; Future - Comprehensive metabolic panel; Future - Hemoglobin A1c; Future - Lipid panel; Future - TSH; Future - Microalbumin/Creatinine Ratio, Urine; Future - metFORMIN (GLUCOPHAGE) 500 MG tablet; Take 1 tablet (500 mg total) by mouth daily with breakfast.  Dispense: 90 tablet; Refill: 1  4. B12 deficiency  - Vitamin B12; Future  5. Mixed hyperlipidemia  - CBC with Differential/Platelet; Future - Comprehensive metabolic  panel; Future - Hemoglobin A1c; Future - Lipid panel; Future - TSH; Future - Microalbumin/Creatinine Ratio, Urine; Future - atorvastatin (LIPITOR) 20 MG tablet; TAKE 1 TABLET BY MOUTH EVERY DAY  Dispense: 90 tablet; Refill: 3  6. Coronary artery disease involving native coronary artery of native heart without angina pectoris - Continue ASA and Statin.  - Follow up with cardiology as directed - CBC with Differential/Platelet; Future - Comprehensive metabolic panel; Future - Hemoglobin A1c; Future - Lipid panel; Future - TSH; Future - Microalbumin/Creatinine Ratio, Urine; Future - metoprolol tartrate (LOPRESSOR) 25 MG tablet; Take 1 tablet (25 mg total) by mouth 2 (two) times daily.  Dispense: 180 tablet; Refill: 3 - atorvastatin (LIPITOR) 20 MG tablet; TAKE 1 TABLET BY MOUTH EVERY DAY  Dispense: 90 tablet; Refill: 3  7. Benign prostatic hyperplasia with urinary obstruction - Follow up with urology as directed  8. Atherosclerosis of aorta ([place]) - Encouraged to stay active and eat a heart healthy diet  - CBC with Differential/Platelet; Future - Comprehensive metabolic panel; Future - Hemoglobin A1c; Future - Lipid panel; Future - TSH; Future - atorvastatin (LIPITOR) 20 MG tablet; TAKE 1 TABLET  BY MOUTH EVERY DAY  Dispense: 90 tablet; Refill: 3  Dorothyann Peng, NP

## 2021-07-17 NOTE — Addendum Note (Signed)
Addended by: Rosalyn Gess D on: 07/17/2021 09:35 AM   Modules accepted: Orders

## 2021-07-17 NOTE — Patient Instructions (Addendum)
If it was great seeing you today   We will follow up with you regarding your blood work   I will see you back in 6 months or sooner if needed

## 2021-07-17 NOTE — Addendum Note (Signed)
Addended by: Rosalyn Gess D on: 07/17/2021 09:28 AM   Modules accepted: Orders

## 2021-07-18 ENCOUNTER — Other Ambulatory Visit: Payer: Self-pay | Admitting: Adult Health

## 2021-07-18 DIAGNOSIS — R7989 Other specified abnormal findings of blood chemistry: Secondary | ICD-10-CM

## 2021-07-25 DIAGNOSIS — L814 Other melanin hyperpigmentation: Secondary | ICD-10-CM | POA: Diagnosis not present

## 2021-07-25 DIAGNOSIS — L821 Other seborrheic keratosis: Secondary | ICD-10-CM | POA: Diagnosis not present

## 2021-07-25 DIAGNOSIS — L570 Actinic keratosis: Secondary | ICD-10-CM | POA: Diagnosis not present

## 2021-08-15 ENCOUNTER — Other Ambulatory Visit (INDEPENDENT_AMBULATORY_CARE_PROVIDER_SITE_OTHER): Payer: Medicare Other

## 2021-08-15 DIAGNOSIS — R7989 Other specified abnormal findings of blood chemistry: Secondary | ICD-10-CM

## 2021-08-15 LAB — T4, FREE: Free T4: 0.9 ng/dL (ref 0.60–1.60)

## 2021-08-15 LAB — TSH: TSH: 4.44 u[IU]/mL (ref 0.35–5.50)

## 2021-08-15 LAB — T3, FREE: T3, Free: 3.2 pg/mL (ref 2.3–4.2)

## 2021-08-15 NOTE — Addendum Note (Signed)
Addended by: Octavio Manns E on: 08/15/2021 10:52 AM   Modules accepted: Orders

## 2021-08-17 ENCOUNTER — Other Ambulatory Visit: Payer: Medicare Other

## 2021-08-17 ENCOUNTER — Telehealth: Payer: Self-pay | Admitting: Pharmacist

## 2021-08-17 NOTE — Chronic Care Management (AMB) (Signed)
    Chronic Care Management Pharmacy Assistant   Name: Michael Johnston  MRN: [id number] DOB: 1932-04-26  08/17/21 APPOINTMENT REMINDER   Patients wife : was reminded to have all medications, supplements and any blood glucose and blood pressure readings available for review with Jeni Salles, Pharm. D, for telephone visit on 08/20/21 at 9 am.   Care Gaps: AWV done 11-07-2020 COVID Vaccines - Overdue Zoster Vaccine- Overdue Lab Results  Component Value Date   HGBA1C 6.9 (H) 07/17/2021    Star Rating Drug:  Metformin 500 mg - [facility] Atorvastatin 20 mg - Last filled [facility]21 [facility]  Any gaps in medications fill history?  None  SIG:   Medications: Outpatient Encounter Medications as of 08/17/2021  Medication Sig   aspirin 81 MG tablet Take 81 mg by mouth daily.   atorvastatin (LIPITOR) 20 MG tablet TAKE 1 TABLET BY MOUTH EVERY DAY   cyanocobalamin (,VITAMIN B-12,) 1000 MCG/ML injection INJECT 1ML INTRAMUSCULARLY every other week   diclofenac Sodium (VOLTAREN) 1 % GEL Apply 2 g topically 4 (four) times daily as needed.   EPINEPHrine 0.3 mg/0.3 mL IJ SOAJ injection    ketoconazole (NIZORAL) 2 % shampoo USE TWICE WEEKLY   metFORMIN (GLUCOPHAGE) 500 MG tablet Take 1 tablet (500 mg total) by mouth daily with breakfast.   metoprolol tartrate (LOPRESSOR) 25 MG tablet Take 1 tablet (25 mg total) by mouth 2 (two) times daily.   nitroGLYCERIN (NITROSTAT) 0.4 MG SL tablet Place 1 tablet (0.4 mg total) under the tongue every 5 (five) minutes as needed for chest pain (3 doses max).   SYRINGE-NEEDLE, DISP, 3 ML (B-D 3CC LUER-LOK SYR 25GX1") 25G X 1" 3 ML MISC USE AS DIRCTED TO INJECT B12   No facility-administered encounter medications on file as of 08/17/2021.      [facility] Clinical Pharmacist Assistant [phone]

## 2021-08-20 ENCOUNTER — Ambulatory Visit: Payer: Medicare Other | Admitting: Pharmacist

## 2021-08-20 DIAGNOSIS — E1149 Type 2 diabetes mellitus with other diabetic neurological complication: Secondary | ICD-10-CM

## 2021-08-20 DIAGNOSIS — I10 Essential (primary) hypertension: Secondary | ICD-10-CM

## 2021-08-20 NOTE — Progress Notes (Signed)
Chronic Care Management Pharmacy Note  08/20/2021 Name:  Michael Johnston MRN:  [id number] DOB:  09-25-1931  Summary: Pt is not feeling well Blood sugars are increasing at home   Recommendations/Changes made from today's visit: -Recommended against using Mucinex and Robitussin DM due to duplicate ingredient and expectorant and suppressant at the same time -Recommended weekly BG monitoring   Plan: Scheduled with PCP for possible bronchitis Follow up in 1 month for DM assessment  Subjective: Michael Johnston is an 87 y.o. year old male who is a primary patient of Dorothyann Peng, NP.  The CCM team was consulted for assistance with disease management and care coordination needs.    Engaged with patient by telephone for follow up visit in response to provider referral for pharmacy case management and/or care coordination services.   Consent to Services:  The patient was given information about Chronic Care Management services, agreed to services, and gave verbal consent prior to initiation of services.  Please see initial visit note for detailed documentation.   Patient Care Team: Dorothyann Peng, NP as PCP - General (Family Medicine) Viona Gilmore, [facility] as Pharmacist (Pharmacist)  Recent office visits: 07/17/21 Dorothyann Peng, NP: Patient presented for annual exam. A1c increased, TSH elevated. Plan to recheck in 1 month.  Recent consult visits: 09/21/20 Tresa Endo, MD (urology): Patient presented for BPH initial consult (last seen 3 years ago). No medication changes.  08/14/20 [name], MD (cardiology): Patient presented for CABG follow up. No changes.   08/09/20 Boneta Lucks, DPM (podiatry): Patient presented for right foot drop.  Hospital visits: 09/15/20 Patient presented to the ED for back pain.  Objective:  Lab Results  Component Value Date   CREATININE 0.82 07/17/2021   BUN 23 07/17/2021   GFR 79.24 07/17/2021   GFRNONAA 82 07/14/2020   GFRAA 95 07/14/2020    NA 139 07/17/2021   K 4.9 07/17/2021   CALCIUM 9.5 07/17/2021   CO2 30 07/17/2021   GLUCOSE 150 (H) 07/17/2021    Lab Results  Component Value Date/Time   HGBA1C 6.9 (H) 07/17/2021 09:28 AM   HGBA1C 6.6 (A) 01/12/2021 09:28 AM   HGBA1C 6.5 (A) 06/20/2020 09:24 AM   HGBA1C 7.0 (H) 09/17/2019 09:18 AM   HGBA1C 6.7 11/06/2018 09:25 AM   GFR 79.24 07/17/2021 09:28 AM   GFR 109.05 03/23/2019 10:10 AM   MICROALBUR <0.7 07/17/2021 09:35 AM   MICROALBUR <0.2 07/14/2020 10:03 AM    Last diabetic Eye exam:  Lab Results  Component Value Date/Time   HMDIABEYEEXA No Retinopathy 07/27/2018 09:10 AM    Last diabetic Foot exam: No results found for: HMDIABFOOTEX   Lab Results  Component Value Date   CHOL 149 07/17/2021   HDL 64.70 07/17/2021   LDLCALC 70 07/17/2021   TRIG 71.0 07/17/2021   CHOLHDL 2 07/17/2021    Hepatic Function Latest Ref Rng & Units 07/17/2021 11/23/2020 07/14/2020  Total Protein 6.0 - 8.3 g/dL 6.9 6.1 6.7  Albumin 3.5 - 5.2 g/dL 4.3 4.0 -  AST 0 - 37 U/L '18 16 17  ' ALT 0 - 53 U/L '19 16 15  ' Alk Phosphatase 39 - 117 U/L 60 61 -  Total Bilirubin 0.2 - 1.2 mg/dL 0.7 0.5 0.9  Bilirubin, Direct 0.0 - 0.3 mg/dL - 0.1 -    Lab Results  Component Value Date/Time   TSH 4.44 08/15/2021 10:52 AM   TSH 6.84 (H) 07/17/2021 09:28 AM   FREET4 0.90 08/15/2021 10:52 AM  CBC Latest Ref Rng & Units 07/17/2021 07/14/2020 09/07/2019  WBC 4.0 - 10.5 K/uL 3.9(L) 4.5 4.9  Hemoglobin 13.0 - 17.0 g/dL 12.9(L) 13.4 12.4(L)  Hematocrit 39.0 - 52.0 % 38.7(L) 40.1 38.6(L)  Platelets 150.0 - 400.0 K/uL 114.0(L) 140 130(L)    No results found for: VD25OH  Clinical ASCVD: Yes  The ASCVD Risk score (Arnett DK, et al., 2019) failed to calculate for the following reasons:   The 2019 ASCVD risk score is only valid for ages 54 to 48    Depression screen PHQ 2/9 11/07/2020 07/14/2020 03/23/2019  Decreased Interest 0 0 0  Down, Depressed, Hopeless 0 0 -  PHQ - 2 Score 0 0 0     Social  History   Tobacco Use  Smoking Status Former   Packs/day: 1.50   Years: 3.00   Pack years: 4.50   Types: Cigarettes   Quit date: 12/09/1951   Years since quitting: 69.7  Smokeless Tobacco Never   BP Readings from Last 3 Encounters:  07/17/21 128/60  01/12/21 120/70  10/25/20 118/62   Pulse Readings from Last 3 Encounters:  07/17/21 60  01/12/21 (!) 51  09/15/20 (!) 58   Wt Readings from Last 3 Encounters:  07/17/21 177 lb (80.3 kg)  01/12/21 178 lb (80.7 kg)  10/25/20 184 lb (83.5 kg)   BMI Readings from Last 3 Encounters:  07/17/21 25.40 kg/m  01/12/21 24.83 kg/m  10/25/20 25.66 kg/m    Assessment/Interventions: Review of patient past medical history, allergies, medications, health status, including review of consultants reports, laboratory and other test data, was performed as part of comprehensive evaluation and provision of chronic care management services.   SDOH:  (Social Determinants of Health) assessments and interventions performed: No  SDOH Screenings   Alcohol Screen: Low Risk    Last Alcohol Screening Score (AUDIT): 0  Depression (PHQ2-9): Low Risk    PHQ-2 Score: 0  Financial Resource Strain: Low Risk    Difficulty of Paying Living Expenses: Not hard at all  Food Insecurity: No Food Insecurity   Worried About Charity fundraiser in the Last Year: Never true   Ran Out of Food in the Last Year: Never true  Housing: Low Risk    Last Housing Risk Score: 0  Physical Activity: Inactive   Days of Exercise per Week: 0 days   Minutes of Exercise per Session: 0 min  Social Connections: Moderately Integrated   Frequency of Communication with Friends and Family: More than three times a week   Frequency of Social Gatherings with Friends and Family: Three times a week   Attends Religious Services: More than 4 times per year   Active Member of Clubs or Organizations: No   Attends Archivist Meetings: Never   Marital Status: Married  Stress: No  Stress Concern Present   Feeling of Stress : Not at all  Tobacco Use: Medium Risk   Smoking Tobacco Use: Former   Smokeless Tobacco Use: Never   Passive Exposure: Not on file  Transportation Needs: Not on file    [place]  Allergies  Allergen Reactions   Levaquin [Levofloxacin In D5w] Swelling and Other (See Comments)    Eyes swollen   Other Anaphylaxis and Other (See Comments)    Bee sting   Metformin And Related Nausea Only    Medications Reviewed Today     Reviewed by Dorothyann Peng, NP (Nurse Practitioner) on 07/17/21 at [phone]  Med List Status: <None>  Medication Order Taking? Sig Documenting Provider Last Dose Status Informant  aspirin 81 MG tablet [id number] Yes Take 81 mg by mouth daily. [provider] Taking Active Family Member  atorvastatin (LIPITOR) 20 MG tablet [id number]  TAKE 1 TABLET BY MOUTH EVERY DAY Nafziger, Tommi Rumps, NP  Active   cyanocobalamin (,VITAMIN B-12,) 1000 MCG/ML injection [id number] Yes INJECT 1ML INTRAMUSCULARLY every other week Dorothyann Peng, NP Taking Active   diclofenac Sodium (VOLTAREN) 1 % GEL [id number] Yes Apply 2 g topically 4 (four) times daily as needed. Margette Fast, MD Taking Active   EPINEPHrine 0.3 mg/0.3 mL IJ SOAJ injection [id number] Yes  [provider] Taking Active   ketoconazole (NIZORAL) 2 % shampoo [id number] Yes USE TWICE WEEKLY Nafziger, Tommi Rumps, NP Taking Active   metFORMIN (GLUCOPHAGE) 500 MG tablet [id number]  Take 1 tablet (500 mg total) by mouth daily with breakfast. Dorothyann Peng, NP  Active   metoprolol tartrate (LOPRESSOR) 25 MG tablet [id number]  Take 1 tablet (25 mg total) by mouth 2 (two) times daily. Nafziger, Tommi Rumps, NP  Active   nitroGLYCERIN (NITROSTAT) 0.4 MG SL tablet [id number] Yes Place 1 tablet (0.4 mg total) under the tongue every 5 (five) minutes as needed for chest pain (3 doses max). Josue Hector, MD Taking Active   SYRINGE-NEEDLE, DISP, 3 ML (B-D 3CC LUER-LOK SYR 25GX1") 25G X 1" 3 ML MISC  [id number] Yes USE AS DIRCTED TO INJECT B12 Dorothyann Peng, NP Taking Active             Patient Active Problem List   Diagnosis Date Noted   Combined forms of age-related cataract of left eye 11/10/2018   Combined forms of age-related cataract of right eye 11/03/2018   Intraoperative floppy iris syndrome (IFIS) 11/03/2018   Urine stream spraying 02/23/2018   Flank pain 02/22/2018   Chest pain 08/21/2017   Unstable angina ([place]) 08/21/2017   Renal cyst 12/20/2016   B12 deficiency 11/08/2016   Type 2 diabetes mellitus with neurological complications ([place]) 17/61/6073   Syncope and collapse 05/24/2015   Benign prostatic hyperplasia with urinary obstruction 12/12/2014   Carotid artery stenosis 05/22/2012   CAD (coronary artery disease) 05/14/2012   ED (erectile dysfunction) of organic origin 08/19/2011   FH: CABG (coronary artery bypass surgery) 01/28/2011   Mixed hyperlipidemia 10/23/2010   GERD (gastroesophageal reflux disease) 10/22/2010   History of colonic polyps 06/14/2010    Immunization History  Administered Date(s) Administered   Fluad Quad(high Dose 65+) 06/07/2019, 06/20/2020   Influenza Inj Mdck Quad Pf 06/07/2019   Influenza Split 07/05/2011, 05/22/2012, 07/28/2013   Influenza Whole 06/14/2010   Influenza, High Dose Seasonal PF 06/25/2014, 06/03/2016, 06/02/2017, 07/02/2018, 06/18/2021   Influenza,inj,Quad PF,6+ Mos 07/02/2018   Influenza-Unspecified 05/18/2015, 06/02/2017   Pneumococcal Conjugate-13 09/14/2013   Pneumococcal Polysaccharide-23 09/09/2005, 07/05/2011   Td 09/10/2003   Tetanus 09/14/2013   Patient reports he thinks he has bronchitis. He is taking Mucinex 12 hr 1200 mg BID right now and Rubitussin dm and this seems to be helping. He has been sleeping in the living room in a recliner.   Patient reports he is also concerned with his blood sugars increasing recently. The only change he made is that he is eating  some [employer] brand sugar free cookies  (voortman - shortbread and chocolate) and drinking twist orange sugar free. He is not eating more regular sweets and has been just as active as usual.  Conditions to be addressed/monitored:  Hypertension, Hyperlipidemia, Diabetes, Coronary Artery Disease, GERD  and vitamin B12 deficiency  Conditions addressed this visit: Hypertension, diabetes  Care Plan : CCM Pharmacy Care Plan  Updates made by Viona Gilmore, [place] since 08/20/2021 12:00 AM     Problem: Problem: Hypertension, Hyperlipidemia, Diabetes, Coronary Artery Disease, GERD and vitamin B12 deficiency      Long-Range Goal: Patient-Specific Goal   Start Date: 01/31/2021  Expected End Date: 01/31/2022  Recent Progress: On track  Priority: High  Note:   Current Barriers:  Unable to independently monitor therapeutic efficacy  Pharmacist Clinical Goal(s):  Patient will achieve adherence to monitoring guidelines and medication adherence to achieve therapeutic efficacy through collaboration with PharmD and provider.   Interventions: 1:1 collaboration with Dorothyann Peng, NP regarding development and update of comprehensive plan of care as evidenced by provider attestation and co-signature Inter-disciplinary care team collaboration (see longitudinal plan of care) Comprehensive medication review performed; medication list updated in electronic medical record  Hypertension (BP goal <140/90) -Controlled -Current treatment: Metoprolol tartrate 25 mg 1 tablet twice daily AM and PM -Medications previously tried: none  -Current home readings: 124/64  -Current dietary habits: did not discuss -Current exercise habits: did not discuss -Denies hypotensive/hypertensive symptoms -Educated on Exercise goal of 150 minutes per week; Importance of home blood pressure monitoring; Proper BP monitoring technique; -Counseled to monitor BP at home weekly, document, and provide log at future appointments -Recommended to continue current  medication  CAD (Goal: prevent heart events) -Controlled -Current treatment  Metoprolol tartrate 25 mg 1 tablet twice daily AM and PM - 1/2 tablet twice daily Nitrostat 0.4 mg SL 1 tablet under tongue every 5 mins PRN for chest pain. 3 doses max Aspirin 81 mg 1 tablet daily -Medications previously tried: none  -Recommended to continue current medication   Hyperlipidemia: (LDL goal < 70) -Controlled -Current treatment: Atorvastatin 20 mg 1 tablet daily at bedtime -Medications previously tried: simvastatin -Current dietary patterns: did not discuss -Current exercise habits: did not discuss -Educated on Cholesterol goals;  Benefits of statin for ASCVD risk reduction; Importance of limiting foods high in cholesterol; -Counseled on diet and exercise extensively Recommended to continue current medication  Diabetes (A1c goal <7%) -Controlled -Current medications: Metformin 500 mg 1 tablet daily with breakfast -Medications previously tried: none  -Current home glucose readings fasting glucose: 174, 164 (checks twice a month) post prandial glucose: n/a -Denies hypoglycemic/hyperglycemic symptoms -Current meal patterns:  breakfast: did not discuss  lunch: did not discuss   dinner: did not discuss  snacks: did not discuss  drinks: did not discuss  -Current exercise: did not discuss  -Educated on A1c and blood sugar goals; Exercise goal of 150 minutes per week; Carbohydrate counting and/or plate method -Counseled to check feet daily and get yearly eye exams -Counseled on diet and exercise extensively Recommended to continue current medication Recommended checking weekly.  GERD (Goal: minimize symptoms of heartburn) -Controlled -Current treatment  Alka seltzer as needed -Medications previously tried: omeprazole -Recommended to continue current medication Counseled on non-pharmacologic management of symptoms such as elevating the head of your bed, avoiding eating 2-3 hours  before bed, avoiding triggering foods such as acidic, spicy, or fatty foods, eating smaller meals, and wearing clothes that are loose around the waist  Vitamin B12 deficiency (Goal: 211-911) -Controlled -Current treatment  Vitamin B12 1000 mcg/ml inject 1 ml once a month -Medications previously tried: none  - Vitamin B12 level is normal. Patient reports daughter in law coming to the house once a month to do his shot.  Health Maintenance -Vaccine gaps: shingrix -Current therapy:  Epipen 0.88m/0.3 ml inject as needed for anaphylaxis Ketoconazole 2% shampoo apply every other day to scalp - lather for 5 minutes then rinse out -Educated on Cost vs benefit of each product must be carefully weighed by individual consumer -Patient is satisfied with current therapy and denies issues -Recommended to continue current medication  Patient Goals/Self-Care Activities Patient will:  - take medications as prescribed check glucose a few times a month, document, and provide at future appointments check blood pressure weekly, document, and provide at future appointments  Follow Up Plan: Telephone follow up appointment with care management team member scheduled for:6 months        Medication Assistance: None required.  Patient affirms current coverage meets needs.  Compliance/Adherence/Medication fill history: Care Gaps: COVID booster, shingrix Last BP: 128/60 A1c: 6.9% (07/17/21)  Star-Rating Drugs: Metformin 500 mg - [facility] Atorvastatin 20 mg - Last filled [facility]  [id number] [id number]: [phone]: [phone] pill box? Yes - spouse helps with medications Pt endorses 100% compliance  We discussed: Current pharmacy is preferred with insurance plan and patient is satisfied with pharmacy services Patient  decided to: Continue current medication management strategy  Patient has been compliant with his medications and [facility]. Introduced to him the option to on board with [facility], but he states that he is happy with the service he gets with his pharmacy now.  Care Plan and Follow Up Patient Decision:  Patient agrees to Care Plan and Follow-up.  Plan: The care management team will reach out to the patient again over the next 30 days.  MaJeni SallesPharmD BCMt [facility] Surgical Center LLClinical Pharmacist [place]

## 2021-08-20 NOTE — Patient Instructions (Signed)
Hi Michael Johnston,  It was great to get to catch up with you again! I hope you feel better soon. Start checking your blood sugar once a week as we discussed to get a better picture of your readings.  Please reach out to me if you have any questions or need anything!  Best, Maddie  Jeni Salles, PharmD, [facility]   Visit Information   Goals Addressed   None    Patient Care Plan: CCM Pharmacy Care Plan     Problem Identified: Problem: Hypertension, Hyperlipidemia, Diabetes, Coronary Artery Disease, GERD and vitamin B12 deficiency      Long-Range Goal: Patient-Specific Goal   Start Date: 01/31/2021  Expected End Date: 01/31/2022  Recent Progress: On track  Priority: High  Note:   Current Barriers:  Unable to independently monitor therapeutic efficacy  Pharmacist Clinical Goal(s):  Patient will achieve adherence to monitoring guidelines and medication adherence to achieve therapeutic efficacy through collaboration with PharmD and provider.   Interventions: 1:1 collaboration with Dorothyann Peng, NP regarding development and update of comprehensive plan of care as evidenced by provider attestation and co-signature Inter-disciplinary care team collaboration (see longitudinal plan of care) Comprehensive medication review performed; medication list updated in electronic medical record  Hypertension (BP goal <140/90) -Controlled -Current treatment: Metoprolol tartrate 25 mg 1 tablet twice daily AM and PM -Medications previously tried: none  -Current home readings: 124/64  -Current dietary habits: did not discuss -Current exercise habits: did not discuss -Denies hypotensive/hypertensive symptoms -Educated on Exercise goal of 150 minutes per week; Importance of home blood pressure monitoring; Proper BP monitoring technique; -Counseled to monitor BP at home weekly, document, and provide log at future  appointments -Recommended to continue current medication  CAD (Goal: prevent heart events) -Controlled -Current treatment  Metoprolol tartrate 25 mg 1 tablet twice daily AM and PM - 1/2 tablet twice daily Nitrostat 0.4 mg SL 1 tablet under tongue every 5 mins PRN for chest pain. 3 doses max Aspirin 81 mg 1 tablet daily -Medications previously tried: none  -Recommended to continue current medication   Hyperlipidemia: (LDL goal < 70) -Controlled -Current treatment: Atorvastatin 20 mg 1 tablet daily at bedtime -Medications previously tried: simvastatin -Current dietary patterns: did not discuss -Current exercise habits: did not discuss -Educated on Cholesterol goals;  Benefits of statin for ASCVD risk reduction; Importance of limiting foods high in cholesterol; -Counseled on diet and exercise extensively Recommended to continue current medication  Diabetes (A1c goal <7%) -Controlled -Current medications: Metformin 500 mg 1 tablet daily with breakfast -Medications previously tried: none  -Current home glucose readings fasting glucose: 174, 164 (checks twice a month) post prandial glucose: n/a -Denies hypoglycemic/hyperglycemic symptoms -Current meal patterns:  breakfast: did not discuss  lunch: did not discuss   dinner: did not discuss  snacks: did not discuss  drinks: did not discuss  -Current exercise: did not discuss  -Educated on A1c and blood sugar goals; Exercise goal of 150 minutes per week; Carbohydrate counting and/or plate method -Counseled to check feet daily and get yearly eye exams -Counseled on diet and exercise extensively Recommended to continue current medication Recommended checking weekly.  GERD (Goal: minimize symptoms of heartburn) -Controlled -Current treatment  Alka seltzer as needed -Medications previously tried: omeprazole -Recommended to continue current medication Counseled on non-pharmacologic management of symptoms such as elevating the  head of your bed, avoiding eating 2-3 hours before bed, avoiding triggering foods such as acidic, spicy, or fatty foods, eating smaller  meals, and wearing clothes that are loose around the waist  Vitamin B12 deficiency (Goal: 211-911) -Controlled -Current treatment  Vitamin B12 1000 mcg/ml inject 1 ml once a month -Medications previously tried: none  - Vitamin B12 level is normal. Patient reports daughter in law coming to the house once a month to do his shot.   Health Maintenance -Vaccine gaps: shingrix -Current therapy:  Epipen 0.3mg /0.3 ml inject as needed for anaphylaxis Ketoconazole 2% shampoo apply every other day to scalp - lather for 5 minutes then rinse out -Educated on Cost vs benefit of each product must be carefully weighed by individual consumer -Patient is satisfied with current therapy and denies issues -Recommended to continue current medication  Patient Goals/Self-Care Activities Patient will:  - take medications as prescribed check glucose a few times a month, document, and provide at future appointments check blood pressure weekly, document, and provide at future appointments  Follow Up Plan: Telephone follow up appointment with care management team member scheduled for:6 months       Patient verbalizes understanding of instructions provided today and agrees to view in [place].  The pharmacy team will reach out to the patient again over the next 30 days.   Viona Gilmore, [facility]

## 2021-08-22 ENCOUNTER — Telehealth (INDEPENDENT_AMBULATORY_CARE_PROVIDER_SITE_OTHER): Payer: Medicare Other | Admitting: Adult Health

## 2021-08-22 ENCOUNTER — Encounter: Payer: Self-pay | Admitting: Adult Health

## 2021-08-22 VITALS — Ht 70.0 in | Wt 177.0 lb

## 2021-08-22 DIAGNOSIS — J40 Bronchitis, not specified as acute or chronic: Secondary | ICD-10-CM

## 2021-08-22 MED ORDER — AMOXICILLIN 500 MG PO TABS: 500.0000 mg | ORAL_TABLET | Freq: Two times a day (BID) | ORAL | 0 refills | Status: AC

## 2021-08-22 MED ORDER — PREDNISONE 10 MG PO TABS: ORAL_TABLET | ORAL | 0 refills | Status: DC

## 2021-08-22 MED ORDER — BENZONATATE 200 MG PO CAPS: 200.0000 mg | ORAL_CAPSULE | Freq: Two times a day (BID) | ORAL | 0 refills | Status: DC | PRN

## 2021-08-22 NOTE — Progress Notes (Signed)
Virtual Visit via Telephone Note  I connected with Michael Johnston on 08/22/21 at  9:00 AM EST by telephone and verified that I am speaking with the correct person using two identifiers.   I discussed the limitations, risks, security and privacy concerns of performing an evaluation and management service by telephone and the availability of in person appointments. I also discussed with the patient that there may be a patient responsible charge related to this service. The patient expressed understanding and agreed to proceed.  Location patient: home Location provider: work or home office Participants present for the call: patient, provider Patient did not have a visit in the prior 7 days to address this/these issue(s).   History of Present Illness: 87 year old male who  has a past medical history of Arthritis, CHEST PAIN, Chronic bronchitis ([place]), COLONIC POLYPS, HX OF, COPD (chronic obstructive pulmonary disease) ([place]), Coronary artery disease, Cystic kidney disease, GERD (gastroesophageal reflux disease), History of hiatal hernia, MI (myocardial infarction) ([place]) (09/2010), Mixed hyperlipidemia, Skin cancer, Sleep apnea, and Syncope and collapse (~ 2013; 05/24/2015).  He is being evaluated today or productive cough with yellow mucus x 10 days. No chest congestion. Does have wheezing but no shortness of breath. He denies chills. Has had low grade fever up to 100.9.   He was using Generic Mucinex which helps to some degree.      Observations/Objective: Patient sounds cheerful and well on the phone. I do not appreciate any SOB. Has constant cough during phone call.  Speech and thought processing are grossly intact. Patient reported vitals:  Assessment and Plan: 1. Bronchitis  - amoxicillin (AMOXIL) 500 MG tablet; Take 1 tablet (500 mg total) by mouth 2 (two) times daily for 10 days.  Dispense: 20 tablet; Refill: 0 - predniSONE (DELTASONE) 10 MG tablet; 40 mg x 3 days, 20 mg x 3 days, 10  mg x 3 days  Dispense: 21 tablet; Refill: 0 - benzonatate (TESSALON) 200 MG capsule; Take 1 capsule (200 mg total) by mouth 2 (two) times daily as needed for cough.  Dispense: 20 capsule; Refill: 0 - Follow up if not resolved in the next 10 days or sooner if symptoms worsen    Follow Up Instructions:   99441 5-10 99442 11-20 9443 21-30 I did not refer this patient for an OV in the next 24 hours for this/these issue(s).  I discussed the assessment and treatment plan with the patient. The patient was provided an opportunity to ask questions and all were answered. The patient agreed with the plan and demonstrated an understanding of the instructions.   The patient was advised to call back or seek an in-person evaluation if the symptoms worsen or if the condition fails to improve as anticipated.  I provided 22 minutes of non-face-to-face time during this encounter.   Dorothyann Peng, NP

## 2021-09-19 ENCOUNTER — Telehealth: Payer: Self-pay | Admitting: Adult Health

## 2021-09-19 DIAGNOSIS — G589 Mononeuropathy, unspecified: Secondary | ICD-10-CM

## 2021-09-19 NOTE — Telephone Encounter (Signed)
Patient called in requesting a referral for physical therapy to be sent to [facility].  Patient could be contacted at [phone].  Please advise.

## 2021-09-20 DIAGNOSIS — N281 Cyst of kidney, acquired: Secondary | ICD-10-CM | POA: Diagnosis not present

## 2021-09-20 DIAGNOSIS — N138 Other obstructive and reflux uropathy: Secondary | ICD-10-CM | POA: Diagnosis not present

## 2021-09-21 NOTE — Telephone Encounter (Signed)
Okay for referral?

## 2021-09-25 NOTE — Telephone Encounter (Signed)
Pt is calling back and would like to go to [facility] phone number [phone] opt 1 and fax number [phone]

## 2021-09-25 NOTE — Telephone Encounter (Signed)
Okay for referral? Thought this was routed my apologies

## 2021-09-26 ENCOUNTER — Telehealth: Payer: Self-pay | Admitting: Adult Health

## 2021-09-26 NOTE — Telephone Encounter (Signed)
Patient notified that referral has been placed and verbalized understanding.

## 2021-09-26 NOTE — Telephone Encounter (Signed)
Lm to return call.

## 2021-09-26 NOTE — Telephone Encounter (Signed)
Pt call and stated he is returning your call and want a call back. 

## 2021-09-27 ENCOUNTER — Telehealth: Payer: Self-pay | Admitting: Pharmacist

## 2021-09-27 NOTE — Chronic Care Management (AMB) (Signed)
Chronic Care Management Pharmacy Assistant   Name: Michael Johnston  MRN: [id number] DOB: [birth year]  Reason for Encounter: Disease State   Conditions to be addressed/monitored: DMII  Recent office visits:  08/22/21 Dorothyann Peng, NP - Patient presented via Video Visit for Bronchitis. Prescribed Amoxicillin 500 mg, Benzonatate 200 mg & Prednisone 10 mg.  Recent consult visits:  09/20/21 Carin Hock, MD (Urology) - Patient presented for BPH and other concerns. No medication changes noted.  Hospital visits:  None in previous 6 months  Medications: Outpatient Encounter Medications as of 09/27/2021  Medication Sig   aspirin 81 MG tablet Take 81 mg by mouth daily.   atorvastatin (LIPITOR) 20 MG tablet TAKE 1 TABLET BY MOUTH EVERY DAY   benzonatate (TESSALON) 200 MG capsule Take 1 capsule (200 mg total) by mouth 2 (two) times daily as needed for cough.   cyanocobalamin (,VITAMIN B-12,) 1000 MCG/ML injection INJECT 1ML INTRAMUSCULARLY every other week   diclofenac Sodium (VOLTAREN) 1 % GEL Apply 2 g topically 4 (four) times daily as needed.   EPINEPHrine 0.3 mg/0.3 mL IJ SOAJ injection    ketoconazole (NIZORAL) 2 % shampoo USE TWICE WEEKLY   metFORMIN (GLUCOPHAGE) 500 MG tablet Take 1 tablet (500 mg total) by mouth daily with breakfast.   metoprolol tartrate (LOPRESSOR) 25 MG tablet Take 1 tablet (25 mg total) by mouth 2 (two) times daily.   nitroGLYCERIN (NITROSTAT) 0.4 MG SL tablet Place 1 tablet (0.4 mg total) under the tongue every 5 (five) minutes as needed for chest pain (3 doses max).   predniSONE (DELTASONE) 10 MG tablet 40 mg x 3 days, 20 mg x 3 days, 10 mg x 3 days   SYRINGE-NEEDLE, DISP, 3 ML (B-D 3CC LUER-LOK SYR 25GX1") 25G X 1" 3 ML MISC USE AS DIRCTED TO INJECT B12   No facility-administered encounter medications on file as of 09/27/2021.  Recent Relevant Labs: Lab Results  Component Value Date/Time   HGBA1C 6.9 (H) 07/17/2021 09:28 AM   HGBA1C 6.6 (A)  01/12/2021 09:28 AM   HGBA1C 6.5 (A) 06/20/2020 09:24 AM   HGBA1C 7.0 (H) 09/17/2019 09:18 AM   HGBA1C 6.7 11/06/2018 09:25 AM   MICROALBUR <0.7 07/17/2021 09:35 AM   MICROALBUR <0.2 07/14/2020 10:03 AM    Kidney Function Lab Results  Component Value Date/Time   CREATININE 0.82 07/17/2021 09:28 AM   CREATININE 0.78 07/14/2020 10:03 AM   CREATININE 0.76 09/07/2019 06:30 AM   GFR 79.24 07/17/2021 09:28 AM   GFRNONAA 82 07/14/2020 10:03 AM   GFRAA 95 07/14/2020 10:03 AM    Current antihyperglycemic regimen:  Metformin 500 mg 1 tablet daily with breakfast What recent interventions/DTPs have been made to improve glycemic control:  Patient reports no changes Have there been any recent hospitalizations or ED visits since last visit with CPP? No Patient denies hypoglycemic symptoms, including None Patient denies hyperglycemic symptoms, including none How often are you checking your blood sugar? once daily What are your blood sugars ranging?  Fasting: 125, 167, 147, 169 Patient reports his sugars have been all over so he does not check regularly but these were all for the week of the 4th. He reports he would like to have some information about specific foods that he should avoid. He reports he has sugar free ice creams,cookies that are sugar free and tries to avoid all other sugars. Has beans and vegetables, beef fish and chicken. Advised him I would try and find some printable information  to send to him in the mail he was in agreement.  During the week, how often does your blood glucose drop below 70? Never Are you checking your feet daily/regularly?   Adherence Review: Is the patient currently on a STATIN medication? Yes Is the patient currently on ACE/ARB medication? No Does the patient have >5 day gap between last estimated fill dates? No  Notes: Patient also reports that he had asked for a therapy referral for his back.    Care Gaps: AWV 3/22 COVID Vaccines - Overdue Zoster  Vaccine- Overdue BP- 128/60 ( 07/17/21)  Lab Results  Component Value Date   HGBA1C 6.9 (H) 07/17/2021    Star Rating Drugs: Metformin 500 mg - Last [facility]/16/2021 [facility] Atorvastatin 20 mg - Last filled 08/16/2021 90 DS at [facility] Pharmacist Assistant [phone]

## 2021-09-29 ENCOUNTER — Other Ambulatory Visit: Payer: Self-pay | Admitting: Adult Health

## 2021-09-29 DIAGNOSIS — E538 Deficiency of other specified B group vitamins: Secondary | ICD-10-CM

## 2021-10-08 ENCOUNTER — Ambulatory Visit: Payer: Medicare Other | Attending: Adult Health

## 2021-10-08 ENCOUNTER — Encounter: Payer: Self-pay | Admitting: Adult Health

## 2021-10-08 ENCOUNTER — Other Ambulatory Visit: Payer: Self-pay

## 2021-10-08 DIAGNOSIS — G589 Mononeuropathy, unspecified: Secondary | ICD-10-CM | POA: Diagnosis not present

## 2021-10-08 DIAGNOSIS — R2681 Unsteadiness on feet: Secondary | ICD-10-CM | POA: Diagnosis not present

## 2021-10-08 DIAGNOSIS — M6281 Muscle weakness (generalized): Secondary | ICD-10-CM

## 2021-10-08 NOTE — Therapy (Signed)
[facility] [place], [place], [address] Phone: [phone]   Fax:  [phone]  Physical Therapy Evaluation  Patient Details  Name: Michael Johnston MRN: [id number] Date of Birth: 09/17/1931 Referring Provider (PT): Nafziger, Np   Encounter Date: 10/08/2021   PT End of Session - 10/08/21 1023     Visit Number 1    Number of Visits 10    PT Start Time 1031    PT Stop Time 7893    PT Time Calculation (min) 40 min    Activity Tolerance Patient tolerated treatment well    Behavior During Therapy [facility] for tasks assessed/performed             Past Medical History:  Diagnosis Date   Arthritis    "right shoulder" (05/24/2015)   CHEST PAIN    Chronic bronchitis ([place])    "get it ~ q yr" (05/24/2015)   COLONIC POLYPS, HX OF    COPD (chronic obstructive pulmonary disease) ([place])    "dx'd but I don't take RX for it" (05/24/2015)   Coronary artery disease    Cystic kidney disease    GERD (gastroesophageal reflux disease)    History of hiatal hernia    MI (myocardial infarction) ([place]) 09/2010   Mixed hyperlipidemia    Skin cancer    "top of my head only" (05/24/2015)   Sleep apnea    Syncope and collapse ~ 2013; 05/24/2015    Past Surgical History:  Procedure Laterality Date   CARDIAC CATHETERIZATION  09/2010   [place]   "partial"   CORONARY ANGIOPLASTY     CORONARY ARTERY BYPASS GRAFT  09/2010   Median sternotomy for coronary artery bypass grafting x3  (left internal mammary artery to distal left anterior  descending  coronary artery, saphenous vein graft to first diagonal branch,  saphenous vein graft to first  obtuse marginal branch, endoscopic  saphenous vein harvest from right thigh). SURGEON:  Valentina Gu.  Roxy Manns, MD  ASSISTANT:  John Giovanni, PA-C  ANESTHESIA:  Glynda Jaeger, MD    LEFT HEART CATH AND CORS/GRAFTS ANGIOGRAPHY N/A 08/22/2017   Procedure: LEFT HEART CATH AND  CORS/GRAFTS ANGIOGRAPHY;  Surgeon: Martinique, Peter M, MD;  Location: [facility];  Service: Cardiovascular;  Laterality: N/A;   SKIN CANCER EXCISION     "top of my head"    There were no vitals filed for this visit.    Subjective Assessment - 10/08/21 1032     Subjective Patient was told that he had a pinched nerve in his back. However, he notes that it has not been hurting him any. He notes that he has fallen twice over the past six months with the most recent being a few months ago when he tripped trying [facility]. However, he was able to catch himself. He notes that he "wobbles" when he walks. He also notes that he has a drop foot in his left leg which has been going on for a couple years. He notes that walking isn't too bad, but his legs start trembling when he stands still. He feels that he may have vertigo because it feels like everything will start spinning when it is aggravated. He notes that this will cause vomiting when this flares up. However, it is not bad right now, but it still feels like everything is blurry. He notes that he has not had  an eye exam in over a year. He notes that looking up can aggravate these symptoms.    Pertinent History hard of hearing    Limitations Standing;Walking    How long can you stand comfortably? 5-10 minutes    Patient Stated Goals play golf, work outside    Currently in Pain? Yes    Pain Location Back    Pain Orientation Lower    Pain Descriptors / Indicators Discomfort    Pain Type Chronic pain    Pain Onset More than a month ago    Pain Frequency Occasional    Aggravating Factors  prolonged standing    Pain Relieving Factors sitting                [facility] PT Assessment - 10/08/21 0001       Assessment   Medical Diagnosis Pinched Nerve    Referring Provider (PT) Nafziger, Np    Onset Date/Surgical Date --   years   Next MD Visit 12/2021    Prior Therapy None recently      Precautions   Precautions Fall       Restrictions   Weight Bearing Restrictions No      Balance Screen   Has the patient fallen in the past 6 months Yes    How many times? 2    Has the patient had a decrease in activity level because of a fear of falling?  No    Is the patient reluctant to leave their home because of a fear of falling?  No      Home Ecologist residence    Living Arrangements Spouse/significant other    Home Access Stairs to enter    Entrance Stairs-Number of Steps 4    Entrance Stairs-Rails Can reach both    [place] One level    [place] - single point      Prior Function   Level of [place] Retired    Leisure work outside, Education administrator   Overall Cognitive Status Within [employer] for tasks assessed    Attention Focused    Focused Attention Appears intact    Memory Appears intact    Awareness Appears intact    Problem Solving Appears intact      Sensation   Additional Comments Patient occasional tingling in his feet, but none currently      ROM / Strength   AROM / PROM / Strength Strength;AROM      AROM   AROM Assessment Site Lumbar    Lumbar Flexion 70    Lumbar Extension 15    Lumbar - Right Rotation 75% limited   with significant  unsteadiness; required minimal assistance from therapist   Lumbar - Left Rotation 50% limited   with moderate unsteadiness; with close supervision     Strength   Strength Assessment Site Hip;Knee;Ankle    Right/Left Hip Right;Left    Right Hip Flexion 4/5    Left Hip Flexion 4/5    Right/Left Knee Left;Right    Right Knee Flexion 4-/5    Right Knee Extension 4/5    Left Knee Flexion 4-/5    Left Knee Extension 4-/5    Right/Left Ankle Right;Left    Right Ankle Dorsiflexion 3/5    Left Ankle Dorsiflexion 3/5      Transfers   Transfers Sit to Stand;Stand to Sit    Sit  to Stand 5: Supervision;Without upper extremity assist;Multiple attempts    Sit to  Stand Details Visual cues/gestures for precautions/safety    Five time sit to stand comments  24 seconds    Stand to Sit 5: Supervision;Without upper extremity assist;Uncontrolled descent    Stand to Sit Details (indicate cue type and reason) Visual cues/gestures for precautions/safety      Ambulation/Gait   Ambulation/Gait Yes    Ambulation/Gait Assistance 6: Modified independent (Device/Increase time)    Assistive device None    Gait Pattern Step-to pattern;Decreased arm swing - right;Decreased arm swing - left;Decreased stride length;Right foot flat;Left foot flat;Decreased trunk rotation;Trunk flexed;Wide base of support    Ambulation Surface Level;Indoor    Gait velocity decreased                        Objective measurements completed on examination: See above findings.                     PT Long Term Goals - 10/08/21 1023       PT LONG TERM GOAL #1   Title Patient will be independent with his HEP.    Time 5    Period Weeks    Status New    Target Date 11/12/21      PT LONG TERM GOAL #2   Title Patient will be able to improve his 5x sit to stand time to 14 seconds or less for impoved safety.    Time 5    Period Weeks    Status New    Target Date 11/12/21      PT LONG TERM GOAL #3   Title Patient will be able to safely navigate at least 80 feet without requiring support from his surroundings.    Time 5    Period Weeks    Status New    Target Date 11/12/21                    Plan - 10/08/21 1156     Clinical Impression Statement Patient is a 88 year old male presenting to physical therapy with lower extremity weakness and multiple falls. He also reported signs and symptoms consistent with BPPV. He would benefit from further vestibular testing to assess the rule out this condition and it's impact on his falls. He is at an elevated fall risk due to his five time sit to stand, history of falls, and his instability with  ambulation. Recommend that he continue with skilled physical therapy to address his remaining impairments to maximize his functional mobility.    Personal Factors and Comorbidities Age;Time since onset of injury/illness/exacerbation;Comorbidity 1;Comorbidity 2;Comorbidity 3+    Comorbidities CAD, angina, diabetes (type 2),    Examination-Activity Limitations Locomotion Level;Transfers;Carry;Stand;Stairs;Squat    Examination-Participation Restrictions Other;Yard Work    Merchant navy officer Evolving/Moderate complexity    Clinical Decision Making Moderate    Rehab Potential Fair    PT Frequency 2x / week    PT Duration Other (comment)   5 weeks   PT Treatment/Interventions ADLs/Self Care Home Management;Electrical Stimulation;Neuromuscular re-education;Balance training;Therapeutic exercise;Therapeutic activities;Functional mobility training;Stair training;Gait training;Patient/family education;Manual techniques;Vestibular    PT Next Visit Plan nustep, lower extremity strengthening    Consulted and Agree with Plan of Care Patient             Patient will benefit from skilled therapeutic intervention in order to improve the following deficits and impairments:  Abnormal gait, Difficulty  walking, Decreased range of motion, Decreased safety awareness, Decreased activity tolerance, Decreased balance, Decreased mobility, Decreased strength  Visit Diagnosis: Unsteadiness on feet  Muscle weakness (generalized)     Problem List Patient Active Problem List   Diagnosis Date Noted   Combined forms of age-related cataract of left eye 11/10/2018   Combined forms of age-related cataract of right eye 11/03/2018   Intraoperative floppy iris syndrome (IFIS) 11/03/2018   Urine stream spraying 02/23/2018   Flank pain 02/22/2018   Chest pain 08/21/2017   Unstable angina ([place]) 08/21/2017   Renal cyst 12/20/2016   B12 deficiency 11/08/2016   Type 2 diabetes mellitus with neurological  complications ([place]) 49/75/3005   Syncope and collapse 05/24/2015   Benign prostatic hyperplasia with urinary obstruction 12/12/2014   Carotid artery stenosis 05/22/2012   CAD (coronary artery disease) 05/14/2012   ED (erectile dysfunction) of organic origin 08/19/2011   FH: CABG (coronary artery bypass surgery) 01/28/2011   Mixed hyperlipidemia 10/23/2010   GERD (gastroesophageal reflux disease) 10/22/2010   History of colonic polyps 06/14/2010    Darlin Coco, PT 10/08/2021, 12:38 PM  [facility] [address] [place], [place], [address] Phone: [phone]   Fax:  [phone]  Name: Michael Johnston MRN: [id number] Date of Birth: 1931-11-12

## 2021-10-11 ENCOUNTER — Ambulatory Visit: Payer: Medicare Other | Attending: Adult Health

## 2021-10-11 ENCOUNTER — Other Ambulatory Visit: Payer: Self-pay

## 2021-10-11 DIAGNOSIS — M6281 Muscle weakness (generalized): Secondary | ICD-10-CM | POA: Diagnosis not present

## 2021-10-11 DIAGNOSIS — R2681 Unsteadiness on feet: Secondary | ICD-10-CM | POA: Diagnosis not present

## 2021-10-11 NOTE — Therapy (Signed)
[facility] [place], [place], [address] Phone: [phone]   Fax:  [phone]  Physical Therapy Treatment  Patient Details  Name: Michael Johnston MRN: [id number] Date of Birth: 06/07/1932 Referring Provider (PT): Nafziger, Np   Encounter Date: 10/11/2021   PT End of Session - 10/11/21 0903     Visit Number 2    Number of Visits 10    PT Start Time 0900    PT Stop Time 0940    PT Time Calculation (min) 40 min    Activity Tolerance Patient tolerated treatment well    Behavior During Therapy [facility] for tasks assessed/performed             Past Medical History:  Diagnosis Date   Arthritis    "right shoulder" (05/24/2015)   CHEST PAIN    Chronic bronchitis ([place])    "get it ~ q yr" (05/24/2015)   COLONIC POLYPS, HX OF    COPD (chronic obstructive pulmonary disease) ([place])    "dx'd but I don't take RX for it" (05/24/2015)   Coronary artery disease    Cystic kidney disease    GERD (gastroesophageal reflux disease)    History of hiatal hernia    MI (myocardial infarction) ([place]) 09/2010   Mixed hyperlipidemia    Skin cancer    "top of my head only" (05/24/2015)   Sleep apnea    Syncope and collapse ~ 2013; 05/24/2015    Past Surgical History:  Procedure Laterality Date   CARDIAC CATHETERIZATION  09/2010   [place]   "partial"   CORONARY ANGIOPLASTY     CORONARY ARTERY BYPASS GRAFT  09/2010   Median sternotomy for coronary artery bypass grafting x3  (left internal mammary artery to distal left anterior  descending  coronary artery, saphenous vein graft to first diagonal branch,  saphenous vein graft to first  obtuse marginal branch, endoscopic  saphenous vein harvest from right thigh). SURGEON:  Valentina Gu.  Roxy Manns, MD  ASSISTANT:  John Giovanni, PA-C  ANESTHESIA:  Glynda Jaeger, MD    LEFT HEART CATH AND CORS/GRAFTS ANGIOGRAPHY N/A 08/22/2017   Procedure: LEFT HEART CATH AND  CORS/GRAFTS ANGIOGRAPHY;  Surgeon: Martinique, Peter M, MD;  Location: [facility];  Service: Cardiovascular;  Laterality: N/A;   SKIN CANCER EXCISION     "top of my head"    There were no vitals filed for this visit.   Subjective Assessment - 10/11/21 0902     Subjective Pt arrives for today's treatment session denying any pain.  Pt states that he is feeling good today.    Pertinent History hard of hearing    Limitations Standing;Walking    How long can you stand comfortably? 5-10 minutes    Patient Stated Goals play golf, work outside    Currently in Pain? No/denies    Pain Onset More than a month ago                               [facility] Adult PT Treatment/Exercise - 10/11/21 0001       Exercises   Exercises Knee/Hip      Knee/Hip Exercises: Aerobic   Nustep Lvl 3 x 15 mins      Knee/Hip Exercises: Seated   Long Arc Quad Strengthening;Both;20 reps;Weights    Long Arc Quad Weight 2 lbs.    Ball Squeeze  20 reps    Clamshell with TheraBand Red   20 reps   Other Seated Knee/Hip Exercises Heel raises x 20 reps, 2#    Marching Strengthening;Both;20 reps;Weights    Marching Limitations --    Marching Weights 2 lbs.    Hamstring Curl Both;20 reps    Hamstring Limitations red tband    Sit to Sand 2 sets;10 reps;without UE support                 Balance Exercises - 10/11/21 0001       Balance Exercises: Standing   Standing Eyes Opened Narrow base of support (BOS);2 reps;30 secs    Standing Eyes Closed Narrow base of support (BOS);2 reps;30 secs    Tandem Stance Eyes open;2 reps;30 secs                     PT Long Term Goals - 10/08/21 1023       PT LONG TERM GOAL #1   Title Patient will be independent with his HEP.    Time 5    Period Weeks    Status New    Target Date 11/12/21      PT LONG TERM GOAL #2   Title Patient will be able to improve his 5x sit to stand time to 14 seconds or less for impoved safety.    Time 5     Period Weeks    Status New    Target Date 11/12/21      PT LONG TERM GOAL #3   Title Patient will be able to safely navigate at least 80 feet without requiring support from his surroundings.    Time 5    Period Weeks    Status New    Target Date 11/12/21                   Plan - 10/11/21 0903     Clinical Impression Statement Pt arrives for today's treatment session denying any pain.  Pt introduced to numerous seated BLE exercises to increase strength and function.  Pt requiring min cues for proper technique, posture, and pacing as pt tends to rush through reps.  Pt requiring CGA/close sup for all STS transfers with cues for reaching full upright stand with each rep.  Pt extremely challenged by standing balance activities requiring minA for safety.  Pt denied pain at completion of today's treatment session, but did endorese incrased fatigue.    Personal Factors and Comorbidities Age;Time since onset of injury/illness/exacerbation;Comorbidity 1;Comorbidity 2;Comorbidity 3+    Comorbidities CAD, angina, diabetes (type 2),    Examination-Activity Limitations Locomotion Level;Transfers;Carry;Stand;Stairs;Squat    Examination-Participation Restrictions Other;Yard Work    Merchant navy officer Evolving/Moderate complexity    Rehab Potential Fair    PT Frequency 2x / week    PT Duration Other (comment)   5 weeks   PT Treatment/Interventions ADLs/Self Care Home Management;Electrical Stimulation;Neuromuscular re-education;Balance training;Therapeutic exercise;Therapeutic activities;Functional mobility training;Stair training;Gait training;Patient/family education;Manual techniques;Vestibular    PT Next Visit Plan nustep, lower extremity strengthening    Consulted and Agree with Plan of Care Patient             Patient will benefit from skilled therapeutic intervention in order to improve the following deficits and impairments:  Abnormal gait, Difficulty walking,  Decreased range of motion, Decreased safety awareness, Decreased activity tolerance, Decreased balance, Decreased mobility, Decreased strength  Visit Diagnosis: Unsteadiness on feet  Muscle weakness (generalized)     Problem  List Patient Active Problem List   Diagnosis Date Noted   Combined forms of age-related cataract of left eye 11/10/2018   Combined forms of age-related cataract of right eye 11/03/2018   Intraoperative floppy iris syndrome (IFIS) 11/03/2018   Urine stream spraying 02/23/2018   Flank pain 02/22/2018   Chest pain 08/21/2017   Unstable angina ([place]) 08/21/2017   Renal cyst 12/20/2016   B12 deficiency 11/08/2016   Type 2 diabetes mellitus with neurological complications ([place]) 97/94/8016   Syncope and collapse 05/24/2015   Benign prostatic hyperplasia with urinary obstruction 12/12/2014   Carotid artery stenosis 05/22/2012   CAD (coronary artery disease) 05/14/2012   ED (erectile dysfunction) of organic origin 08/19/2011   FH: CABG (coronary artery bypass surgery) 01/28/2011   Mixed hyperlipidemia 10/23/2010   GERD (gastroesophageal reflux disease) 10/22/2010   History of colonic polyps 06/14/2010    Kathrynn Ducking, PTA 10/11/2021, 9:44 AM  [facility] [address] [place], [place], [address] Phone: [phone]   Fax:  [phone]  Name: PRAVIN PEREZPEREZ MRN: [id number] Date of Birth: May 31, 1932

## 2021-10-15 ENCOUNTER — Ambulatory Visit: Payer: Medicare Other

## 2021-10-16 ENCOUNTER — Ambulatory Visit (INDEPENDENT_AMBULATORY_CARE_PROVIDER_SITE_OTHER): Payer: Medicare Other

## 2021-10-16 VITALS — Ht 70.0 in | Wt 177.0 lb

## 2021-10-16 DIAGNOSIS — Z0000 Encounter for general adult medical examination without abnormal findings: Secondary | ICD-10-CM | POA: Diagnosis not present

## 2021-10-16 NOTE — Progress Notes (Signed)
Subjective:   Michael Johnston is a 88 y.o. male who presents for Medicare Annual/Subsequent preventive examination.  Review of Systems    Virtual Visit via Telephone Note  I connected with  Michael Johnston on 10/16/21 at  1:00 PM EST by telephone and verified that I am speaking with the correct person using two identifiers.  Location: Patient: Home Provider: Office Persons participating in the virtual visit: patient/Nurse Health Advisor   I discussed the limitations, risks, security and privacy concerns of performing an evaluation and management service by telephone and the availability of in person appointments. The patient expressed understanding and agreed to proceed.  Interactive audio and video telecommunications were attempted between this nurse and patient, however failed, due to patient having technical difficulties OR patient did not have access to video capability.  We continued and completed visit with audio only.  Some vital signs may be absent or patient reported.   Criselda Peaches, LPN  Cardiac Risk Factors include: diabetes mellitus;advanced age (>33men, >81 women)     Objective:    Today's Vitals   10/16/21 1300  Weight: 177 lb (80.3 kg)  Height: 5\' 10"  (1.778 m)   Body mass index is 25.4 kg/m.  Advanced Directives 10/16/2021 10/08/2021 11/07/2020 09/15/2020 09/07/2019 03/31/2018 08/21/2017  Does Patient Have a Medical Advance Directive? Yes No No No No No No  Type of Paramedic of [place];Living will - - - - - -  Does patient want to make changes to medical advance directive? No - Patient declined - - - - - -  Copy of [place] in Chart? No - copy requested - - - - - -  Would patient like information on creating a medical advance directive? - - No - Patient declined No - Patient declined - - No - Patient declined  Pre-existing out of facility DNR order (yellow form or pink MOST form) - - - - - - -    Current  Medications (verified) Outpatient Encounter Medications as of 10/16/2021  Medication Sig   aspirin 81 MG tablet Take 81 mg by mouth daily.   atorvastatin (LIPITOR) 20 MG tablet TAKE 1 TABLET BY MOUTH EVERY DAY   benzonatate (TESSALON) 200 MG capsule Take 1 capsule (200 mg total) by mouth 2 (two) times daily as needed for cough.   cyanocobalamin (,VITAMIN B-12,) 1000 MCG/ML injection INJECT 1ML INTRAMUSCULARLY EVERY OTHER WEEK   diclofenac Sodium (VOLTAREN) 1 % GEL Apply 2 g topically 4 (four) times daily as needed.   EPINEPHrine 0.3 mg/0.3 mL IJ SOAJ injection    ketoconazole (NIZORAL) 2 % shampoo USE TWICE WEEKLY   metFORMIN (GLUCOPHAGE) 500 MG tablet Take 1 tablet (500 mg total) by mouth daily with breakfast.   metoprolol tartrate (LOPRESSOR) 25 MG tablet Take 1 tablet (25 mg total) by mouth 2 (two) times daily.   nitroGLYCERIN (NITROSTAT) 0.4 MG SL tablet Place 1 tablet (0.4 mg total) under the tongue every 5 (five) minutes as needed for chest pain (3 doses max).   predniSONE (DELTASONE) 10 MG tablet 40 mg x 3 days, 20 mg x 3 days, 10 mg x 3 days   SYRINGE-NEEDLE, DISP, 3 ML (B-D 3CC LUER-LOK SYR 25GX1") 25G X 1" 3 ML MISC USE AS DIRCTED TO INJECT B12   No facility-administered encounter medications on file as of 10/16/2021.    Allergies (verified) Levaquin [levofloxacin in d5w], Other, and Metformin and related   History: Past Medical History:  Diagnosis Date   Arthritis    "right shoulder" (05/24/2015)   CHEST PAIN    Chronic bronchitis ([place])    "get it ~ q yr" (05/24/2015)   COLONIC POLYPS, HX OF    COPD (chronic obstructive pulmonary disease) ([place])    "dx'd but I don't take RX for it" (05/24/2015)   Coronary artery disease    Cystic kidney disease    GERD (gastroesophageal reflux disease)    History of hiatal hernia    MI (myocardial infarction) ([place]) 09/2010   Mixed hyperlipidemia    Skin cancer    "top of my head only" (05/24/2015)   Sleep apnea    Syncope and collapse ~  2013; 05/24/2015   Past Surgical History:  Procedure Laterality Date   CARDIAC CATHETERIZATION  09/2010   CHOLECYSTECTOMY OPEN  1998   COLECTOMY  1998   "partial"   CORONARY ANGIOPLASTY     CORONARY ARTERY BYPASS GRAFT  09/2010   Median sternotomy for coronary artery bypass grafting x3  (left internal mammary artery to distal left anterior  descending  coronary artery, saphenous vein graft to first diagonal branch,  saphenous vein graft to first  obtuse marginal branch, endoscopic  saphenous vein harvest from right thigh). SURGEON:  Valentina Gu.  Roxy Manns, MD  ASSISTANT:  John Giovanni, PA-C  ANESTHESIA:  Glynda Jaeger, MD    LEFT HEART CATH AND CORS/GRAFTS ANGIOGRAPHY N/A 08/22/2017   Procedure: LEFT HEART CATH AND CORS/GRAFTS ANGIOGRAPHY;  Surgeon: Martinique, Peter M, MD;  Location: [place] [facility];  Service: Cardiovascular;  Laterality: N/A;   SKIN CANCER EXCISION     "top of my head"   Family History  Problem Relation Age of Onset   Lung cancer Sister    Heart attack Father    Social History   Socioeconomic History   Marital status: Married    Spouse name: Not on file   Number of children: Not on file   Years of education: Not on file   Highest education level: Not on file  Occupational History   Not on file  Tobacco Use   Smoking status: Former    Packs/day: 1.50    Years: 3.00    Pack years: 4.50    Types: Cigarettes    Quit date: 12/09/1951    Years since quitting: 69.9   Smokeless tobacco: Never  Vaping Use   Vaping Use: Never used  Substance and Sexual Activity   Alcohol use: Not Currently    Comment: "no alcohol since 1953"   Drug use: No   Sexual activity: Not on file  Other Topics Concern   Not on file  Social History Narrative   Retired    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[place] Resource Strain: Low Risk    Difficulty of Paying Living Expenses: Not hard at all  Food Insecurity: No Food Insecurity   Worried About  Charity fundraiser in the Last Year: Never true   Arboriculturist in the Last Year: Never true  Transportation Needs: No Transportation Needs   Lack of Transportation (Medical): No   Lack of Transportation (Non-Medical): No  Physical Activity: Insufficiently Active   Days of Exercise per Week: 2 days   Minutes of Exercise per Session: 40 min  Stress: No Stress Concern Present   Feeling of Stress : Not at all  Social Connections: Socially Integrated   Frequency  of Communication with Friends and Family: More than three times a week   Frequency of Social Gatherings with Friends and Family: More than three times a week   Attends Religious Services: More than 4 times per year   Active Member of [employer] or Organizations: Yes   Attends Archivist Meetings: More than 4 times per year   Marital Status: Married    Clinical Intake: How often do you need to have someone help you when you read instructions, pamphlets, or other written materials from your doctor or pharmacy?: 1 - Never  Diabetic?  Yes Nutrition Risk Assessment:  Has the patient had any N/V/D within the last 2 months?  No  Does the patient have any non-healing wounds?  No  Has the patient had any unintentional weight loss or weight gain?  No   Diabetes:  Is the patient diabetic?  Yes  If diabetic, was a CBG obtained today?  Yes  CBC 129 Did the patient bring in their glucometer from home?  No  Audio Visit How often do you monitor your CBG's? 2-3 times per week.   Financial Strains and Diabetes Management:  Are you having any financial strains with the device, your supplies or your medication? No .  Does the patient want to be seen by Chronic Care Management for management of their diabetes?  No  Would the patient like to be referred to a Nutritionist or for Diabetic Management?  No   Diabetic Exams:  Diabetic Eye Exam: Completed Yes.   Diabetic Foot Exam: Completed Yes. Pt has been advised about the importance  in completing this exam. Pt is scheduled for diabetic foot exam on Followed by PCP.   Interpreter Needed?: No  Activities of Daily Living In your present state of health, do you have any difficulty performing the following activities: 10/16/2021 11/07/2020  Hearing? N Y  Comment - has bilateral hearing aids  Vision? N N  Difficulty concentrating or making decisions? N N  Walking or climbing stairs? N N  Dressing or bathing? N N  Doing errands, shopping? N N  Preparing Food and eating ? N N  Comment Wife assist -  Using the Toilet? N N  In the past six months, have you accidently leaked urine? N N  Do you have problems with loss of bowel control? N N  Managing your Medications? N N  Comment Wife assist -  Managing your Finances? N N  Housekeeping or managing your Housekeeping? N N  Comment Wife assist -  Some recent data might be hidden    Patient Care Team: Dorothyann Peng, NP as PCP - General (Family Medicine) Viona Gilmore, [facility] as Pharmacist (Pharmacist)  Indicate any recent Medical Services you may have received from other than [facility] providers in the past year (date may be approximate).     Assessment:   This is a routine wellness examination for [place].  Hearing/Vision screen Hearing Screening - Comments:: Wears Hearing Aids Vision Screening - Comments:: Wears glasses. Followed by [facility]  Dietary issues and exercise activities discussed: Current Exercise Habits: Home exercise routine, Type of exercise: walking, Time (Minutes): 40, Frequency (Times/Week): 2, Weekly Exercise (Minutes/Week): 80, Intensity: Mild, Exercise limited by: None identified   Goals Addressed               This Visit's Progress     Increase physical activity (pt-stated)        Maintain diet and exercise.  Depression Screen PHQ 2/9 Scores 10/16/2021 11/07/2020 07/14/2020 03/23/2019 03/19/2018 10/18/2014 08/31/2014  PHQ - 2 Score 0 0 0 0 0 0 0    Fall Risk Fall Risk  10/16/2021  11/07/2020 07/14/2020 03/23/2019 03/19/2018  Falls in the past year? 0 1 1 0 Yes  Number falls in past yr: 0 1 1 - 2 or more  Injury with Fall? 0 0 0 - No  Risk for fall due to : No Fall Risks Impaired balance/gait;History of fall(s) History of fall(s) - -  Follow up - Falls evaluation completed;Falls prevention discussed Falls evaluation completed - -    FALL RISK PREVENTION PERTAINING TO THE HOME:  Any stairs in or around the home? Yes  If so, are there any without handrails? No  Home free of loose throw rugs in walkways, pet beds, electrical cords, etc? Yes  Adequate lighting in your home to reduce risk of falls? Yes   ASSISTIVE DEVICES UTILIZED TO PREVENT FALLS:  Life alert? No  Use of a cane, walker or w/c? Yes  Grab bars in the bathroom? No  Shower chair or bench in shower? No  Elevated toilet seat or a handicapped toilet? No   TIMED UP AND GO:  Was the test performed? No . Audio Visit  Cognitive Function:     Immunizations Immunization History  Administered Date(s) Administered   Fluad Quad(high Dose 65+) 06/07/2019, 06/20/2020   Influenza Inj Mdck Quad Pf 06/07/2019   Influenza Split 07/05/2011, 05/22/2012, 07/28/2013   Influenza Whole 06/14/2010   Influenza, High Dose Seasonal PF 06/25/2014, 06/03/2016, 06/02/2017, 07/02/2018, 06/18/2021   Influenza,inj,Quad PF,6+ Mos 07/02/2018   Influenza-Unspecified 05/18/2015, 06/02/2017   Pneumococcal Conjugate-13 09/14/2013   Pneumococcal Polysaccharide-23 09/09/2005, 07/05/2011   Td 09/10/2003   Tetanus 09/14/2013    TDAP status: Up to date  Flu Vaccine status: Up to date  Pneumococcal vaccine status: Up to date  Covid-19 vaccine status: Information provided on how to obtain vaccines.   Qualifies for Shingles Vaccine? Yes   Zostavax completed No   Shingrix Completed?: No.    Education has been provided regarding the importance of this vaccine. Patient has been advised to call insurance company to determine out of  pocket expense if they have not yet received this vaccine. Advised may also receive vaccine at local pharmacy or Health Dept. Verbalized acceptance and understanding.  Screening Tests Health Maintenance  Topic Date Due   COVID-19 Vaccine (1) 11/01/2021 (Originally 12/29/1932)   Zoster Vaccines- Shingrix (1 of 2) 01/13/2022 (Originally 06[birth year]29/1984)   OPHTHALMOLOGY EXAM  12/13/2021   HEMOGLOBIN A1C  01/14/2022   FOOT EXAM  07/17/2022   URINE MICROALBUMIN  07/17/2022   TETANUS/TDAP  09/15/2023   Pneumonia Vaccine 32+ Years old  Completed   INFLUENZA VACCINE  Completed   HPV VACCINES  Aged Out    Health Maintenance  There are no preventive care reminders to display for this patient.   Colorectal cancer screening: No longer required.      Additional Screening:  Hepatitis C Screening: does not qualify   Vision Screening: Recommended annual ophthalmology exams for early detection of glaucoma and other disorders of the eye. Is the patient up to date with their annual eye exam?  Yes  Who is the provider or what is the name of the office in which the patient attends annual eye exams? [facility] If pt is not established with a provider, would they like to be referred to a provider to establish care? No .  Dental Screening: Recommended annual dental exams for proper oral hygiene  Community Resource Referral / Chronic Care Management: CRR required this visit?  No   CCM required this visit?  No      Plan:     I have personally reviewed and noted the following in the patients chart:   Medical and social history Use of alcohol, tobacco or illicit drugs  Current medications and supplements including opioid prescriptions. Patient is not currently taking opioid prescriptions. Functional ability and status Nutritional status Physical activity Advanced directives List of other physicians Hospitalizations, surgeries, and ER visits in previous 12 months Vitals Screenings to  include cognitive, depression, and falls Referrals and appointments  In addition, I have reviewed and discussed with patient certain preventive protocols, quality metrics, and best practice recommendations. A written personalized care plan for preventive services as well as general preventive health recommendations were provided to patient.     Criselda Peaches, LPN   12/10/6065   Nurse Notes: None

## 2021-10-16 NOTE — Patient Instructions (Addendum)
Michael Johnston , Thank you for taking time to come for your Medicare Wellness Visit. I appreciate your ongoing commitment to your health goals. Please review the following plan we discussed and let me know if I can assist you in the future.   These are the goals we discussed:  Goals       Increase physical activity (pt-stated)      Maintain diet and exercise.        This is a list of the screening recommended for you and due dates:  Health Maintenance  Topic Date Due   COVID-19 Vaccine (1) 11/01/2021*   Zoster (Shingles) Vaccine (1 of 2) 01/13/2022*   Eye exam for diabetics  12/13/2021   Hemoglobin A1C  01/14/2022   Complete foot exam   07/17/2022   Urine Protein Check  07/17/2022   Tetanus Vaccine  09/15/2023   Pneumonia Vaccine  Completed   Flu Shot  Completed   HPV Vaccine  Aged Out  *Topic was postponed. The date shown is not the original due date.   Advanced directives: Yes Patient will bring copy  Conditions/risks identified: None  Next appointment: Follow up in one year for your annual wellness visit.   Preventive Care 86 Years and Older, Male Preventive care refers to lifestyle choices and visits with your health care provider that can promote health and wellness. What does preventive care include? A yearly physical exam. This is also called an annual well check. Dental exams once or twice a year. Routine eye exams. Ask your health care provider how often you should have your eyes checked. Personal lifestyle choices, including: Daily care of your teeth and gums. Regular physical activity. Eating a healthy diet. Avoiding tobacco and drug use. Limiting alcohol use. Practicing safe sex. Taking low doses of aspirin every day. Taking vitamin and mineral supplements as recommended by your health care provider. What happens during an annual well check? The services and screenings done by your health care provider during your annual well check will depend on your age,  overall health, lifestyle risk factors, and family history of disease. Counseling  Your health care provider may ask you questions about your: Alcohol use. Tobacco use. Drug use. Emotional well-being. Home and relationship well-being. Sexual activity. Eating habits. History of falls. Memory and ability to understand (cognition). Work and work Statistician. Screening  You may have the following tests or measurements: Height, weight, and BMI. Blood pressure. Lipid and cholesterol levels. These may be checked every 5 years, or more frequently if you are over 6 years old. Skin check. Lung cancer screening. You may have this screening every year starting at age 86 if you have a 30-pack-year history of smoking and currently smoke or have quit within the past 15 years. Fecal occult blood test (FOBT) of the stool. You may have this test every year starting at age 86. Flexible sigmoidoscopy or colonoscopy. You may have a sigmoidoscopy every 5 years or a colonoscopy every 10 years starting at age 86. Prostate cancer screening. Recommendations will vary depending on your family history and other risks. Hepatitis C blood test. Hepatitis B blood test. Sexually transmitted disease (STD) testing. Diabetes screening. This is done by checking your blood sugar (glucose) after you have not eaten for a while (fasting). You may have this done every 1-3 years. Abdominal aortic aneurysm (AAA) screening. You may need this if you are a current or former smoker. Osteoporosis. You may be screened starting at age 86 if you are at  high risk. Talk with your health care provider about your test results, treatment options, and if necessary, the need for more tests. Vaccines  Your health care provider may recommend certain vaccines, such as: Influenza vaccine. This is recommended every year. Tetanus, diphtheria, and acellular pertussis (Tdap, Td) vaccine. You may need a Td booster every 10 years. Zoster vaccine.  You may need this after age 79. Pneumococcal 13-valent conjugate (PCV13) vaccine. One dose is recommended after age 86. Pneumococcal polysaccharide (PPSV23) vaccine. One dose is recommended after age 86. Talk to your health care provider about which screenings and vaccines you need and how often you need them. This information is not intended to replace advice given to you by your health care provider. Make sure you discuss any questions you have with your health care provider. Document Released: 09/22/2015 Document Revised: 05/15/2016 Document Reviewed: 06/27/2015 Elsevier Interactive Patient Education  2017 [place] Prevention in the Home Falls can cause injuries. They can happen to people of all ages. There are many things you can do to make your home safe and to help prevent falls. What can I do on the outside of my home? Regularly fix the edges of walkways and driveways and fix any cracks. Remove anything that might make you trip as you walk through a door, such as a raised step or threshold. Trim any bushes or trees on the path to your home. Use bright outdoor lighting. Clear any walking paths of anything that might make someone trip, such as rocks or tools. Regularly check to see if handrails are loose or broken. Make sure that both sides of any steps have handrails. Any raised decks and porches should have guardrails on the edges. Have any leaves, snow, or ice cleared regularly. Use sand or salt on walking paths during winter. Clean up any spills in your garage right away. This includes oil or grease spills. What can I do in the bathroom? Use night lights. Install grab bars by the toilet and in the tub and shower. Do not use towel bars as grab bars. Use non-skid mats or decals in the tub or shower. If you need to sit down in the shower, use a plastic, non-slip stool. Keep the floor dry. Clean up any water that spills on the floor as soon as it happens. Remove soap  buildup in the tub or shower regularly. Attach bath mats securely with double-sided non-slip rug tape. Do not have throw rugs and other things on the floor that can make you trip. What can I do in the bedroom? Use night lights. Make sure that you have a light by your bed that is easy to reach. Do not use any sheets or blankets that are too big for your bed. They should not hang down onto the floor. Have a firm chair that has side arms. You can use this for support while you get dressed. Do not have throw rugs and other things on the floor that can make you trip. What can I do in the kitchen? Clean up any spills right away. Avoid walking on wet floors. Keep items that you use a lot in easy-to-reach places. If you need to reach something above you, use a strong step stool that has a grab bar. Keep electrical cords out of the way. Do not use floor polish or wax that makes floors slippery. If you must use wax, use non-skid floor wax. Do not have throw rugs and other things on the floor that  can make you trip. What can I do with my stairs? Do not leave any items on the stairs. Make sure that there are handrails on both sides of the stairs and use them. Fix handrails that are broken or loose. Make sure that handrails are as long as the stairways. Check any carpeting to make sure that it is firmly attached to the stairs. Fix any carpet that is loose or worn. Avoid having throw rugs at the top or bottom of the stairs. If you do have throw rugs, attach them to the floor with carpet tape. Make sure that you have a light switch at the top of the stairs and the bottom of the stairs. If you do not have them, ask someone to add them for you. What else can I do to help prevent falls? Wear shoes that: Do not have high heels. Have rubber bottoms. Are comfortable and fit you well. Are closed at the toe. Do not wear sandals. If you use a stepladder: Make sure that it is fully opened. Do not climb a closed  stepladder. Make sure that both sides of the stepladder are locked into place. Ask someone to hold it for you, if possible. Clearly mark and make sure that you can see: Any grab bars or handrails. First and last steps. Where the edge of each step is. Use tools that help you move around (mobility aids) if they are needed. These include: Canes. Walkers. Scooters. Crutches. Turn on the lights when you go into a dark area. Replace any light bulbs as soon as they burn out. Set up your furniture so you have a clear path. Avoid moving your furniture around. If any of your floors are uneven, fix them. If there are any pets around you, be aware of where they are. Review your medicines with your doctor. Some medicines can make you feel dizzy. This can increase your chance of falling. Ask your doctor what other things that you can do to help prevent falls. This information is not intended to replace advice given to you by your health care provider. Make sure you discuss any questions you have with your health care provider. Document Released: 06/22/2009 Document Revised: 02/01/2016 Document Reviewed: 09/30/2014 Elsevier Interactive Patient Education  2017 [employer].

## 2021-10-18 ENCOUNTER — Other Ambulatory Visit: Payer: Self-pay

## 2021-10-18 ENCOUNTER — Ambulatory Visit: Payer: Medicare Other

## 2021-10-18 DIAGNOSIS — R2681 Unsteadiness on feet: Secondary | ICD-10-CM | POA: Diagnosis not present

## 2021-10-18 DIAGNOSIS — M6281 Muscle weakness (generalized): Secondary | ICD-10-CM

## 2021-10-18 NOTE — Therapy (Signed)
[facility] [place], [place], [address] Phone: [phone]   Fax:  [phone]  Physical Therapy Treatment  Patient Details  Name: Michael Johnston MRN: [id number] Date of Birth: 04-12-1932 Referring Provider (PT): Nafziger, Np   Encounter Date: 10/18/2021   PT End of Session - 10/18/21 0906     Visit Number 3    Number of Visits 10    PT Start Time 0900    PT Stop Time 9935    PT Time Calculation (min) 42 min    Activity Tolerance Patient tolerated treatment well    Behavior During Therapy [facility] for tasks assessed/performed             Past Medical History:  Diagnosis Date   Arthritis    "right shoulder" (05/24/2015)   CHEST PAIN    Chronic bronchitis ([place])    "get it ~ q yr" (05/24/2015)   COLONIC POLYPS, HX OF    COPD (chronic obstructive pulmonary disease) ([place])    "dx'd but I don't take RX for it" (05/24/2015)   Coronary artery disease    Cystic kidney disease    GERD (gastroesophageal reflux disease)    History of hiatal hernia    MI (myocardial infarction) ([place]) 09/2010   Mixed hyperlipidemia    Skin cancer    "top of my head only" (05/24/2015)   Sleep apnea    Syncope and collapse ~ 2013; 05/24/2015    Past Surgical History:  Procedure Laterality Date   CARDIAC CATHETERIZATION  09/2010   [place]   "partial"   CORONARY ANGIOPLASTY     CORONARY ARTERY BYPASS GRAFT  09/2010   Median sternotomy for coronary artery bypass grafting x3  (left internal mammary artery to distal left anterior  descending  coronary artery, saphenous vein graft to first diagonal branch,  saphenous vein graft to first  obtuse marginal branch, endoscopic  saphenous vein harvest from right thigh). SURGEON:  Valentina Gu.  Roxy Manns, MD  ASSISTANT:  John Giovanni, PA-C  ANESTHESIA:  Glynda Jaeger, MD    LEFT HEART CATH AND CORS/GRAFTS ANGIOGRAPHY N/A 08/22/2017   Procedure: LEFT HEART CATH AND  CORS/GRAFTS ANGIOGRAPHY;  Surgeon: Martinique, Peter M, MD;  Location: [place] [facility];  Service: Cardiovascular;  Laterality: N/A;   SKIN CANCER EXCISION     "top of my head"    There were no vitals filed for this visit.   Subjective Assessment - 10/18/21 0906     Subjective Pt arrives for today's treatment session denying any pain.  Pt states that he is feeling "alittle wobbly" today.    Pertinent History hard of hearing    Limitations Standing;Walking    How long can you stand comfortably? 5-10 minutes    Patient Stated Goals play golf, work outside    Currently in Pain? No/denies    Pain Onset More than a month ago                               [facility] Adult PT Treatment/Exercise - 10/18/21 0001       Knee/Hip Exercises: Aerobic   Nustep Lvl 3 x 15 mins      Knee/Hip Exercises: Seated   Long Arc Quad Strengthening;Both;Weights;3 sets;10 reps    Long Arc Quad Weight 2 lbs.    Ball Squeeze 20 reps    Clamshell with [employer]  Red   20 reps   Marching Strengthening;Both;20 reps;Weights    Marching Weights 2 lbs.    Hamstring Curl Both;20 reps    Hamstring Limitations red tband                 Balance Exercises - 10/18/21 0001       Balance Exercises: Standing   Rockerboard UE support   3 mins, cues for pacing   Step Ups 6 inch;UE support 2   cues for sequencing   Sidestepping Foam/compliant support;5 reps;Upper extremity support   down and back in parallel bars   Marching Solid surface;Upper extremity assist 2;Static;20 reps    Heel Raises 20 reps;Both                     PT Long Term Goals - 10/08/21 1023       PT LONG TERM GOAL #1   Title Patient will be independent with his HEP.    Time 5    Period Weeks    Status New    Target Date 11/12/21      PT LONG TERM GOAL #2   Title Patient will be able to improve his 5x sit to stand time to 14 seconds or less for impoved safety.    Time 5    Period Weeks    Status New     Target Date 11/12/21      PT LONG TERM GOAL #3   Title Patient will be able to safely navigate at least 80 feet without requiring support from his surroundings.    Time 5    Period Weeks    Status New    Target Date 11/12/21                   Plan - 10/18/21 0906     Clinical Impression Statement Pt arrives for today's treatment session denying any pain, but does endorse feeling "wobbly."  Pt instructed in numerous standing balance activities to increase function and safety and to reduce fall risk.  Pt requiring close sup with all balance activities.  Pt requiring min cues for posture with all sitting exercises.  Pt states that he wishes to work on his BPPV symptoms at next session.    Personal Factors and Comorbidities Age;Time since onset of injury/illness/exacerbation;Comorbidity 1;Comorbidity 2;Comorbidity 3+    Comorbidities CAD, angina, diabetes (type 2),    Examination-Activity Limitations Locomotion Level;Transfers;Carry;Stand;Stairs;Squat    Examination-Participation Restrictions Other;Yard Work    Merchant navy officer Evolving/Moderate complexity    Rehab Potential Fair    PT Frequency 2x / week    PT Duration Other (comment)   5 weeks   PT Treatment/Interventions ADLs/Self Care Home Management;Electrical Stimulation;Neuromuscular re-education;Balance training;Therapeutic exercise;Therapeutic activities;Functional mobility training;Stair training;Gait training;Patient/family education;Manual techniques;Vestibular    PT Next Visit Plan nustep, lower extremity strengthening    Consulted and Agree with Plan of Care Patient             Patient will benefit from skilled therapeutic intervention in order to improve the following deficits and impairments:  Abnormal gait, Difficulty walking, Decreased range of motion, Decreased safety awareness, Decreased activity tolerance, Decreased balance, Decreased mobility, Decreased strength  Visit  Diagnosis: Unsteadiness on feet  Muscle weakness (generalized)     Problem List Patient Active Problem List   Diagnosis Date Noted   Combined forms of age-related cataract of left eye 11/10/2018   Combined forms of age-related cataract of right eye 11/03/2018  Intraoperative floppy iris syndrome (IFIS) 11/03/2018   Urine stream spraying 02/23/2018   Flank pain 02/22/2018   Chest pain 08/21/2017   Unstable angina ([place]) 08/21/2017   Renal cyst 12/20/2016   B12 deficiency 11/08/2016   Type 2 diabetes mellitus with neurological complications ([place]) 35/57/3220   Syncope and collapse 05/24/2015   Benign prostatic hyperplasia with urinary obstruction 12/12/2014   Carotid artery stenosis 05/22/2012   CAD (coronary artery disease) 05/14/2012   ED (erectile dysfunction) of organic origin 08/19/2011   FH: CABG (coronary artery bypass surgery) 01/28/2011   Mixed hyperlipidemia 10/23/2010   GERD (gastroesophageal reflux disease) 10/22/2010   History of colonic polyps 06/14/2010    Kathrynn Ducking, PTA 10/18/2021, 9:49 AM  [facility] [address] [place], [place], [address] Phone: [phone]   Fax:  [phone]  Name: ZYLON CREAMER MRN: [id number] Date of Birth: 1932-07-01

## 2021-10-22 ENCOUNTER — Other Ambulatory Visit: Payer: Self-pay

## 2021-10-22 ENCOUNTER — Ambulatory Visit: Payer: Medicare Other

## 2021-10-22 DIAGNOSIS — R2681 Unsteadiness on feet: Secondary | ICD-10-CM | POA: Diagnosis not present

## 2021-10-22 DIAGNOSIS — M6281 Muscle weakness (generalized): Secondary | ICD-10-CM | POA: Diagnosis not present

## 2021-10-22 NOTE — Therapy (Signed)
[facility] [place], [place], [address] Phone: [phone]   Fax:  [phone]  Physical Therapy Treatment  Patient Details  Name: Michael Johnston MRN: [id number] Date of Birth: 1932-06-10 Referring Provider (PT): Nafziger, Np   Encounter Date: 10/22/2021   PT End of Session - 10/22/21 0904     Visit Number 4    Number of Visits 10    PT Start Time 0900    PT Stop Time 5056    PT Time Calculation (min) 42 min    Activity Tolerance Patient tolerated treatment well    Behavior During Therapy [facility] for tasks assessed/performed             Past Medical History:  Diagnosis Date   Arthritis    "right shoulder" (05/24/2015)   CHEST PAIN    Chronic bronchitis ([place])    "get it ~ q yr" (05/24/2015)   COLONIC POLYPS, HX OF    COPD (chronic obstructive pulmonary disease) ([place])    "dx'd but I don't take RX for it" (05/24/2015)   Coronary artery disease    Cystic kidney disease    GERD (gastroesophageal reflux disease)    History of hiatal hernia    MI (myocardial infarction) ([place]) 09/2010   Mixed hyperlipidemia    Skin cancer    "top of my head only" (05/24/2015)   Sleep apnea    Syncope and collapse ~ 2013; 05/24/2015    Past Surgical History:  Procedure Laterality Date   CARDIAC CATHETERIZATION  09/2010   [place]   "partial"   CORONARY ANGIOPLASTY     CORONARY ARTERY BYPASS GRAFT  09/2010   Median sternotomy for coronary artery bypass grafting x3  (left internal mammary artery to distal left anterior  descending  coronary artery, saphenous vein graft to first diagonal branch,  saphenous vein graft to first  obtuse marginal branch, endoscopic  saphenous vein harvest from right thigh). SURGEON:  Valentina Gu.  Roxy Manns, MD  ASSISTANT:  John Giovanni, PA-C  ANESTHESIA:  Glynda Jaeger, MD    LEFT HEART CATH AND CORS/GRAFTS ANGIOGRAPHY N/A 08/22/2017   Procedure: LEFT HEART CATH AND  CORS/GRAFTS ANGIOGRAPHY;  Surgeon: Martinique, Peter M, MD;  Location: [place] [facility];  Service: Cardiovascular;  Laterality: N/A;   SKIN CANCER EXCISION     "top of my head"    There were no vitals filed for this visit.   Subjective Assessment - 10/22/21 0903     Subjective Patient reports that he feels about the same today. He still notes that his vision is blurry today. He was able to get an eye exam scheduled for the end of this month.    Pertinent History hard of hearing    Limitations Standing;Walking    How long can you stand comfortably? 5-10 minutes    Patient Stated Goals play golf, work outside    Currently in Pain? No/denies    Pain Onset More than a month ago                               [facility] Adult PT Treatment/Exercise - 10/22/21 0001       Knee/Hip Exercises: Aerobic   Nustep Lvl 4 x 15 mins      Knee/Hip Exercises: Standing   Heel Raises --    Hip Extension Both;1 set;20 reps;Knee straight  Knee/Hip Exercises: Seated   Long Arc Quad Strengthening;Both;Weights;3 sets;10 reps    Long Arc Quad Weight 3 lbs.    Other Seated Knee/Hip Exercises VOR x1   head movement only; 3 x 45 seconds; symptoms not exceeding 3/10                Balance Exercises - 10/22/21 0001       Balance Exercises: Standing   Marching Foam/compliant surface;Upper extremity assist 2;Static   2 minutes   Heel Raises 20 reps;Both                     PT Long Term Goals - 10/08/21 1023       PT LONG TERM GOAL #1   Title Patient will be independent with his HEP.    Time 5    Period Weeks    Status New    Target Date 11/12/21      PT LONG TERM GOAL #2   Title Patient will be able to improve his 5x sit to stand time to 14 seconds or less for impoved safety.    Time 5    Period Weeks    Status New    Target Date 11/12/21      PT LONG TERM GOAL #3   Title Patient will be able to safely navigate at least 80 feet without requiring support  from his surroundings.    Time 5    Period Weeks    Status New    Target Date 11/12/21                   Plan - 10/22/21 2951     Clinical Impression Statement Patient was introduced to VOR interventions in addition to being progressed with familiar interventions. He required moderate cuing with VOR x1 for proper cervical ROM while maintaining focus on the letter. He experienced a mild increase in dizziness with this activity, but he was educated to avoid exacerbating his symptoms beyond a 3/10. He reported feeling alright upon the conclusion of treatment. He continues to require skilled physical therapy to address his remaining impairments to return to his prior level of function.    Personal Factors and Comorbidities Age;Time since onset of injury/illness/exacerbation;Comorbidity 1;Comorbidity 2;Comorbidity 3+    Comorbidities CAD, angina, diabetes (type 2),    Examination-Activity Limitations Locomotion Level;Transfers;Carry;Stand;Stairs;Squat    Examination-Participation Restrictions Other;Yard Work    Merchant navy officer Evolving/Moderate complexity    Rehab Potential Fair    PT Frequency 2x / week    PT Duration Other (comment)   5 weeks   PT Treatment/Interventions ADLs/Self Care Home Management;Electrical Stimulation;Neuromuscular re-education;Balance training;Therapeutic exercise;Therapeutic activities;Functional mobility training;Stair training;Gait training;Patient/family education;Manual techniques;Vestibular    PT Next Visit Plan nustep, lower extremity strengthening    Consulted and Agree with Plan of Care Patient             Patient will benefit from skilled therapeutic intervention in order to improve the following deficits and impairments:  Abnormal gait, Difficulty walking, Decreased range of motion, Decreased safety awareness, Decreased activity tolerance, Decreased balance, Decreased mobility, Decreased strength  Visit  Diagnosis: Unsteadiness on feet  Muscle weakness (generalized)     Problem List Patient Active Problem List   Diagnosis Date Noted   Combined forms of age-related cataract of left eye 11/10/2018   Combined forms of age-related cataract of right eye 11/03/2018   Intraoperative floppy iris syndrome (IFIS) 11/03/2018   Urine stream spraying 02/23/2018  Flank pain 02/22/2018   Chest pain 08/21/2017   Unstable angina ([place]) 08/21/2017   Renal cyst 12/20/2016   B12 deficiency 11/08/2016   Type 2 diabetes mellitus with neurological complications ([place]) 56/97/9480   Syncope and collapse 05/24/2015   Benign prostatic hyperplasia with urinary obstruction 12/12/2014   Carotid artery stenosis 05/22/2012   CAD (coronary artery disease) 05/14/2012   ED (erectile dysfunction) of organic origin 08/19/2011   FH: CABG (coronary artery bypass surgery) 01/28/2011   Mixed hyperlipidemia 10/23/2010   GERD (gastroesophageal reflux disease) 10/22/2010   History of colonic polyps 06/14/2010    Darlin Coco, PT 10/22/2021, 12:32 PM  [facility] [address] [place], [place], [address] Phone: [phone]   Fax:  [phone]  Name: Michael Johnston MRN: [id number] Date of Birth: 01/16/1932

## 2021-10-25 ENCOUNTER — Other Ambulatory Visit: Payer: Self-pay

## 2021-10-25 ENCOUNTER — Ambulatory Visit: Payer: Medicare Other

## 2021-10-25 DIAGNOSIS — M6281 Muscle weakness (generalized): Secondary | ICD-10-CM | POA: Diagnosis not present

## 2021-10-25 DIAGNOSIS — R2681 Unsteadiness on feet: Secondary | ICD-10-CM | POA: Diagnosis not present

## 2021-10-25 NOTE — Therapy (Signed)
[facility] [place], [place], [address] Phone: [phone]   Fax:  [phone]  Physical Therapy Treatment  Patient Details  Name: Michael Johnston MRN: [id number] Date of Birth: Apr 20, 1932 Referring Provider (PT): Nafziger, Np   Encounter Date: 10/25/2021   PT End of Session - 10/25/21 0908     Visit Number 5    Number of Visits 10    PT Start Time 0900    PT Stop Time 0940    PT Time Calculation (min) 40 min    Activity Tolerance Patient tolerated treatment well    Behavior During Therapy [facility] for tasks assessed/performed             Past Medical History:  Diagnosis Date   Arthritis    "right shoulder" (05/24/2015)   CHEST PAIN    Chronic bronchitis ([place])    "get it ~ q yr" (05/24/2015)   COLONIC POLYPS, HX OF    COPD (chronic obstructive pulmonary disease) ([place])    "dx'd but I don't take RX for it" (05/24/2015)   Coronary artery disease    Cystic kidney disease    GERD (gastroesophageal reflux disease)    History of hiatal hernia    MI (myocardial infarction) ([place]) 09/2010   Mixed hyperlipidemia    Skin cancer    "top of my head only" (05/24/2015)   Sleep apnea    Syncope and collapse ~ 2013; 05/24/2015    Past Surgical History:  Procedure Laterality Date   CARDIAC CATHETERIZATION  09/2010   [place]   "partial"   CORONARY ANGIOPLASTY     CORONARY ARTERY BYPASS GRAFT  09/2010   Median sternotomy for coronary artery bypass grafting x3  (left internal mammary artery to distal left anterior  descending  coronary artery, saphenous vein graft to first diagonal branch,  saphenous vein graft to first  obtuse marginal branch, endoscopic  saphenous vein harvest from right thigh). SURGEON:  Valentina Gu.  Roxy Manns, MD  ASSISTANT:  John Giovanni, PA-C  ANESTHESIA:  Glynda Jaeger, MD    LEFT HEART CATH AND CORS/GRAFTS ANGIOGRAPHY N/A 08/22/2017   Procedure: LEFT HEART CATH AND  CORS/GRAFTS ANGIOGRAPHY;  Surgeon: Martinique, Peter M, MD;  Location: [place] [facility];  Service: Cardiovascular;  Laterality: N/A;   SKIN CANCER EXCISION     "top of my head"    There were no vitals filed for this visit.   Subjective Assessment - 10/25/21 0907     Subjective Pt arrives for today's treatment session denying any pain.  Pt starts that he feels "blurry" this morning and that he had some diarrhea at home before coming into for today's session.    Pertinent History hard of hearing    Limitations Standing;Walking    How long can you stand comfortably? 5-10 minutes    Patient Stated Goals play golf, work outside    Currently in Pain? No/denies    Pain Onset More than a month ago                               [facility] Adult PT Treatment/Exercise - 10/25/21 0001       Knee/Hip Exercises: Aerobic   Nustep Lvl 4 x 15 mins      Knee/Hip Exercises: Standing   Hip Abduction Both;20 reps;Knee straight    Hip Extension Both;20 reps;Knee straight  Forward Step Up Both;20 reps;Hand Hold: 2;Step Height: 6"    Functional Squat 15 reps      Knee/Hip Exercises: Seated   Long Arc Quad Strengthening;Both;Weights;3 sets;10 reps    Long Arc Quad Weight 4 lbs.    Ball Squeeze 20 reps    Clamshell with TheraBand Red   20 reps   Other Seated Knee/Hip Exercises Heel raises 4# x 20 reps    Marching Strengthening;Both;20 reps;Weights    Marching Weights 4 lbs.    Hamstring Curl Both;20 reps    Hamstring Limitations red tband    Sit to Sand 2 sets;10 reps;without UE support                          PT Long Term Goals - 10/08/21 1023       PT LONG TERM GOAL #1   Title Patient will be independent with his HEP.    Time 5    Period Weeks    Status New    Target Date 11/12/21      PT LONG TERM GOAL #2   Title Patient will be able to improve his 5x sit to stand time to 14 seconds or less for impoved safety.    Time 5    Period Weeks    Status New     Target Date 11/12/21      PT LONG TERM GOAL #3   Title Patient will be able to safely navigate at least 80 feet without requiring support from his surroundings.    Time 5    Period Weeks    Status New    Target Date 11/12/21                   Plan - 10/25/21 0908     Clinical Impression Statement Pt arrives for today's treatment session denying any pain, but reports feeling "blurry" and that he had some diarhhea this morning.  Pt able to tolerate increased standing exercises, but continues to require cues for pacing due to tendency to rush through reps.  Pt denied any pain at completion of today's treatment session, but does endorse fatigue.    Personal Factors and Comorbidities Age;Time since onset of injury/illness/exacerbation;Comorbidity 1;Comorbidity 2;Comorbidity 3+    Comorbidities CAD, angina, diabetes (type 2),    Examination-Activity Limitations Locomotion Level;Transfers;Carry;Stand;Stairs;Squat    Examination-Participation Restrictions Other;Yard Work    [employer]chant [employer] officer Evolving/Moderate complexity    Rehab Potential Fair    PT Frequency 2x / week    PT Duration Other (comment)   5 weeks   PT Treatment/Interventions ADLs/Self Care Home Management;Electrical Stimulation;Neuromuscular re-education;Balance training;Therapeutic exercise;Therapeutic activities;Functional mobility training;Stair training;Gait training;Patient/family education;Manual techniques;Vestibular    PT Next Visit Plan nustep, lower extremity strengthening    Consulted and Agree with Plan of Care Patient             Patient will benefit from skilled therapeutic intervention in order to improve the following deficits and impairments:  Abnormal gait, Difficulty walking, Decreased range of motion, Decreased safety awareness, Decreased activity tolerance, Decreased balance, Decreased mobility, Decreased strength  Visit Diagnosis: Unsteadiness on feet  Muscle weakness  (generalized)     Problem List Patient Active Problem List   Diagnosis Date Noted   Combined forms of age-related cataract of left eye 11/10/2018   Combined forms of age-related cataract of right eye 11/03/2018   Intraoperative floppy iris syndrome (IFIS) 11/03/2018   Urine stream spraying 02/23/2018  Flank pain 02/22/2018   Chest pain 08/21/2017   Unstable angina ([place]) 08/21/2017   Renal cyst 12/20/2016   B12 deficiency 11/08/2016   Type 2 diabetes mellitus with neurological complications ([place]) 09/81/1914   Syncope and collapse 05/24/2015   Benign prostatic hyperplasia with urinary obstruction 12/12/2014   Carotid artery stenosis 05/22/2012   CAD (coronary artery disease) 05/14/2012   ED (erectile dysfunction) of organic origin 08/19/2011   FH: CABG (coronary artery bypass surgery) 01/28/2011   Mixed hyperlipidemia 10/23/2010   GERD (gastroesophageal reflux disease) 10/22/2010   History of colonic polyps 06/14/2010    Kathrynn Ducking, PTA 10/25/2021, 9:46 AM  [facility] [address] [place], [place], [address] Phone: [phone]   Fax:  [phone]  Name: Michael Johnston MRN: [id number] Date of Birth: 1932-05-24

## 2021-10-29 ENCOUNTER — Encounter: Payer: Self-pay | Admitting: Family Medicine

## 2021-10-29 ENCOUNTER — Other Ambulatory Visit: Payer: Self-pay

## 2021-10-29 ENCOUNTER — Ambulatory Visit: Payer: Medicare Other | Admitting: Physical Therapy

## 2021-10-29 ENCOUNTER — Ambulatory Visit (INDEPENDENT_AMBULATORY_CARE_PROVIDER_SITE_OTHER): Payer: Medicare Other | Admitting: Family Medicine

## 2021-10-29 ENCOUNTER — Ambulatory Visit (INDEPENDENT_AMBULATORY_CARE_PROVIDER_SITE_OTHER): Payer: Medicare Other

## 2021-10-29 VITALS — BP 118/70 | HR 74 | Temp 98.3°F | Wt 181.0 lb

## 2021-10-29 DIAGNOSIS — M7989 Other specified soft tissue disorders: Secondary | ICD-10-CM | POA: Diagnosis not present

## 2021-10-29 DIAGNOSIS — M79671 Pain in right foot: Secondary | ICD-10-CM

## 2021-10-29 MED ORDER — COLCHICINE 0.6 MG PO TABS: 0.6000 mg | ORAL_TABLET | Freq: Two times a day (BID) | ORAL | 0 refills | Status: DC

## 2021-10-29 NOTE — Progress Notes (Signed)
° °  Subjective:    Patient ID: Michael Johnston, male    DOB: 1931/12/02, 88 y.o.   MRN: [id number]  HPI Here for 4 weeks of swelling and pain in the right lateral ankle and right lateral foot. No recent trauma. This has happened a few times in the past few years, but it usually resolves after a few days. No other joints bother him.    Review of Systems  Constitutional: Negative.   Respiratory: Negative.    Cardiovascular: Negative.   Musculoskeletal:  Positive for arthralgias.      Objective:   Physical Exam Constitutional:      Appearance: Normal appearance.  Cardiovascular:     Rate and Rhythm: Normal rate and regular rhythm.     Pulses: Normal pulses.     Heart sounds: Normal heart sounds.  Pulmonary:     Effort: Pulmonary effort is normal.     Breath sounds: Normal breath sounds.  Musculoskeletal:     Comments: The right foot  is swollen from just above the ankle to the toes. He is quite tender over the medial and lateral malleoli, and along the lateral foot. The foot is red and cool. No pain on dorsiflexion or extension, but pronation and supination is painful   Neurological:     Mental Status: He is alert.          Assessment & Plan:  This is likely to be gout. We will get Xrays  today to rule out a stress fracture, etc. Treat with Colchicine 0.6 mg BID as needed.  Alysia Penna, MD

## 2021-10-31 ENCOUNTER — Telehealth: Payer: Self-pay | Admitting: Adult Health

## 2021-10-31 NOTE — Telephone Encounter (Signed)
Spoke with pt reviewed x-ray results, verbalized understanding

## 2021-10-31 NOTE — Telephone Encounter (Signed)
Pt is calling and seen dr fry on 10-29-2021 and would like foot xray results

## 2021-11-05 LAB — HM DIABETES EYE EXAM

## 2021-11-11 ENCOUNTER — Other Ambulatory Visit: Payer: Self-pay | Admitting: Adult Health

## 2021-11-11 DIAGNOSIS — E538 Deficiency of other specified B group vitamins: Secondary | ICD-10-CM

## 2021-11-13 NOTE — Telephone Encounter (Signed)
Patient need to schedule an ov for more refills. 

## 2021-11-22 ENCOUNTER — Ambulatory Visit: Payer: Medicare Other | Attending: Adult Health

## 2021-11-22 ENCOUNTER — Other Ambulatory Visit: Payer: Self-pay | Admitting: Family Medicine

## 2021-11-22 ENCOUNTER — Other Ambulatory Visit: Payer: Self-pay

## 2021-11-22 DIAGNOSIS — R2681 Unsteadiness on feet: Secondary | ICD-10-CM | POA: Diagnosis not present

## 2021-11-22 DIAGNOSIS — M6281 Muscle weakness (generalized): Secondary | ICD-10-CM | POA: Insufficient documentation

## 2021-11-22 NOTE — Therapy (Signed)
[place] ?[facility] ?[address] ?[place], [place], [address] ?Phone: [phone]   Fax:  [phone] ? ?Physical Therapy Treatment ? ?Patient Details  ?Name: Michael Johnston ?MRN: [id number] ?Date of Birth: October 27, 1931 ?Referring Provider (PT): Nafziger, Np ? ? ?Encounter Date: 11/22/2021 ? ? PT End of Session - 11/22/21 0943   ? ? Visit Number 6   ? Number of Visits 10   ? PT Start Time 9242   ? PT Stop Time 6834   ? PT Time Calculation (min) 42 min   ? Activity Tolerance Patient tolerated treatment well   ? Behavior During Therapy [facility] for tasks assessed/performed   ? ?  ?  ? ?  ? ? ?Past Medical History:  ?Diagnosis Date  ? Arthritis   ? "right shoulder" (05/24/2015)  ? CHEST PAIN   ? Chronic bronchitis ([place])   ? "get it ~ q yr" (05/24/2015)  ? COLONIC POLYPS, HX OF   ? COPD (chronic obstructive pulmonary disease) ([place])   ? "dx'd but I don't take RX for it" (05/24/2015)  ? Coronary artery disease   ? Cystic kidney disease   ? GERD (gastroesophageal reflux disease)   ? History of hiatal hernia   ? MI (myocardial infarction) ([place]) 09/2010  ? Mixed hyperlipidemia   ? Skin cancer   ? "top of my head only" (05/24/2015)  ? Sleep apnea   ? Syncope and collapse ~ 2013; 05/24/2015  ? ? ?Past Surgical History:  ?Procedure Laterality Date  ? CARDIAC CATHETERIZATION  09/2010  ? CHOLECYSTECTOMY OPEN  1998  ? COLECTOMY  1998  ? "partial"  ? CORONARY ANGIOPLASTY    ? CORONARY ARTERY BYPASS GRAFT  09/2010  ? Median sternotomy for coronary artery bypass grafting x3  (left internal mammary artery to distal left anterior  descending  coronary artery, saphenous vein graft to first diagonal branch,  saphenous vein graft to first  obtuse marginal branch, endoscopic  saphenous vein harvest from right thigh). SURGEON:  Valentina Gu.  Roxy Manns, MD  ASSISTANT:  John Giovanni, PA-C  ANESTHESIA:  Glynda Jaeger, MD   ? LEFT HEART CATH AND CORS/GRAFTS ANGIOGRAPHY N/A 08/22/2017  ? Procedure: LEFT HEART CATH AND  CORS/GRAFTS ANGIOGRAPHY;  Surgeon: Martinique, Peter M, MD;  Location: [facility];  Service: Cardiovascular;  Laterality: N/A;  ? SKIN CANCER EXCISION    ? "top of my head"  ? ? ?There were no vitals filed for this visit. ? ? Subjective Assessment - 11/22/21 0943   ? ? Subjective Pt arrives for today's treatment session denying any pain.  Pt states that he had a gout flare up in his right foot and today is the first day it looks normal.  Pt also reports he went to the eye doctor and there are no issues with his eyes at this time.   ? Pertinent History hard of hearing   ? Limitations Standing;Walking   ? How long can you stand comfortably? 5-10 minutes   ? Patient Stated Goals play golf, work outside   ? Currently in Pain? No/denies   ? Pain Onset More than a month ago   ? ?  ?  ? ?  ? ? ? ? ? ? ? ? ? ? ? ? ? ? ? ? ? ? ? ? [facility] Adult PT Treatment/Exercise - 11/22/21 0001   ? ?  ? Knee/Hip Exercises: Aerobic  ? Nustep Lvl 4 x 15 mins   ?  ?  Knee/Hip Exercises: Standing  ? Hip Flexion Stengthening;Both;20 reps;Knee bent   ? Hip Flexion Limitations 4 lb   ? Hip Abduction Both;20 reps;Knee straight   ? Hip Extension Both;20 reps;Knee straight   ? Forward Step Up Both;20 reps;Hand Hold: 2;Step Height: 6"   ? Rocker Board 3 minutes   ?  ? Knee/Hip Exercises: Seated  ? Long [employer] Strengthening;Both;20 reps;Weights   ? Long Arc Quad Weight 4 lbs.   ? Ball Squeeze 20 reps   ? Clamshell with TheraBand Red   20 reps  ? Marching --   ? Marching Weights --   ? Hamstring Curl Both;20 reps   ? Hamstring Limitations red tband   ? Sit to Sand 2 sets;10 reps;without UE support   rest break in between sets  ? ?  ?  ? ?  ? ? ? ? ? ? ? ? ? ? ? ? ? ? ? PT Long Term Goals - 10/08/21 1023   ? ?  ? PT LONG TERM GOAL #1  ? Title Patient will be independent with his HEP.   ? Time 5   ? Period Weeks   ? Status New   ? Target Date 11/12/21   ?  ? PT LONG TERM GOAL #2  ? Title Patient will be able to improve his 5x sit to stand time to 14  seconds or less for impoved safety.   ? Time 5   ? Period Weeks   ? Status New   ? Target Date 11/12/21   ?  ? PT LONG TERM GOAL #3  ? Title Patient will be able to safely navigate at least 80 feet without requiring support from his surroundings.   ? Time 5   ? Period Weeks   ? Status New   ? Target Date 11/12/21   ? ?  ?  ? ?  ? ? ? ? ? ? ? ? Plan - 11/22/21 0944   ? ? Clinical Impression Statement Pt arrives for today's treatment session denying any pain. Pt states that he has been dealing with a gout flare up that has kept him from coming to therapy.  Pt also reports that his visit to the eye doctor was unremarkable.  Pt able to tolerate increased standing exercises during today's treatment session, requiring decreased rest breaks between exercises and activities.  Pt eager to get done with therapy today as his wife is at home not feeling well.  Pt denied any pain at completion of today's treatment session, but does endorse increased fatigue.   ? Personal Factors and Comorbidities Age;Time since onset of injury/illness/exacerbation;Comorbidity 1;Comorbidity 2;Comorbidity 3+   ? Comorbidities CAD, angina, diabetes (type 2),   ? Examination-Activity Limitations Locomotion Level;Transfers;Carry;Stand;Stairs;Squat   ? Examination-Participation Restrictions Other;[employer] Work   ? Stability/Clinical Decision Making Evolving/Moderate complexity   ? Rehab Potential Fair   ? PT Frequency 2x / week   ? PT Duration Other (comment)   5 weeks  ? PT Treatment/Interventions ADLs/Self Care Home Management;Electrical Stimulation;Neuromuscular re-education;Balance training;Therapeutic exercise;Therapeutic activities;Functional mobility training;Stair training;Gait training;Patient/family education;Manual techniques;Vestibular   ? PT Next Visit Plan nustep, lower extremity strengthening   ? Consulted and Agree with Plan of Care Patient   ? ?  ?  ? ?  ? ? ?Patient will benefit from skilled therapeutic intervention in order to improve  the following deficits and impairments:  Abnormal gait, Difficulty walking, Decreased range of motion, Decreased safety awareness, Decreased activity tolerance, Decreased balance, Decreased  mobility, Decreased strength ? ?Visit Diagnosis: ?Unsteadiness on feet ? ?Muscle weakness (generalized) ? ? ? ? ?Problem List ?Patient Active Problem List  ? Diagnosis Date Noted  ? Combined forms of age-related cataract of left eye 11/10/2018  ? Combined forms of age-related cataract of right eye 11/03/2018  ? Intraoperative floppy iris syndrome (IFIS) 11/03/2018  ? Urine stream spraying 02/23/2018  ? Flank pain 02/22/2018  ? Chest pain 08/21/2017  ? Unstable angina ([place]) 08/21/2017  ? Renal cyst 12/20/2016  ? B12 deficiency 11/08/2016  ? Type 2 diabetes mellitus with neurological complications ([place]) 83/15/1761  ? Syncope and collapse 05/24/2015  ? Benign prostatic hyperplasia with urinary obstruction 12/12/2014  ? Carotid artery stenosis 05/22/2012  ? CAD (coronary artery disease) 05/14/2012  ? ED (erectile dysfunction) of organic origin 08/19/2011  ? FH: CABG (coronary artery bypass surgery) 01/28/2011  ? Mixed hyperlipidemia 10/23/2010  ? GERD (gastroesophageal reflux disease) 10/22/2010  ? History of colonic polyps 06/14/2010  ? ? ?Kathrynn Ducking, PTA ?11/22/2021, 10:30 AM ? ?[facility] ?[facility] ?[address] ?[place], [place], [address] ?Phone: [phone]   Fax:  [phone] ? ?Name: DMARIO RUSSOM ?MRN: [id number] ?Date of Birth: [birth year] ? ? ? ?

## 2021-11-27 ENCOUNTER — Other Ambulatory Visit: Payer: Self-pay

## 2021-11-27 ENCOUNTER — Ambulatory Visit: Payer: Medicare Other | Admitting: *Deleted

## 2021-11-27 DIAGNOSIS — M6281 Muscle weakness (generalized): Secondary | ICD-10-CM

## 2021-11-27 DIAGNOSIS — R2681 Unsteadiness on feet: Secondary | ICD-10-CM | POA: Diagnosis not present

## 2021-11-27 NOTE — Therapy (Signed)
[place] ?[facility] ?[address] ?[place], [place], [address] ?Phone: [phone]   Fax:  [phone] ? ?Physical Therapy Treatment ? ?Patient Details  ?Name: Michael Johnston ?MRN: [id number] ?Date of Birth: May 15, 1932 ?Referring Provider (PT): Nafziger, Np ? ? ?Encounter Date: 11/27/2021 ? ? PT End of Session - 11/27/21 1038   ? ? Visit Number 7   ? Number of Visits 10   ? PT Start Time [id number]   ? PT Stop Time 1035   ? PT Time Calculation (min) 49 min   ? ?  ?  ? ?  ? ? ?Past Medical History:  ?Diagnosis Date  ? Arthritis   ? "right shoulder" (05/24/2015)  ? CHEST PAIN   ? Chronic bronchitis ([place])   ? "get it ~ q yr" (05/24/2015)  ? COLONIC POLYPS, HX OF   ? COPD (chronic obstructive pulmonary disease) ([place])   ? "dx'd but I don't take RX for it" (05/24/2015)  ? Coronary artery disease   ? Cystic kidney disease   ? GERD (gastroesophageal reflux disease)   ? History of hiatal hernia   ? MI (myocardial infarction) ([place]) 09/2010  ? Mixed hyperlipidemia   ? Skin cancer   ? "top of my head only" (05/24/2015)  ? Sleep apnea   ? Syncope and collapse ~ 2013; 05/24/2015  ? ? ?Past Surgical History:  ?Procedure Laterality Date  ? CARDIAC CATHETERIZATION  09/2010  ? CHOLECYSTECTOMY OPEN  1998  ? COLECTOMY  1998  ? "partial"  ? CORONARY ANGIOPLASTY    ? CORONARY ARTERY BYPASS GRAFT  09/2010  ? Median sternotomy for coronary artery bypass grafting x3  (left internal mammary artery to distal left anterior  descending  coronary artery, saphenous vein graft to first diagonal branch,  saphenous vein graft to first  obtuse marginal branch, endoscopic  saphenous vein harvest from right thigh). SURGEON:  Valentina Gu.  Roxy Manns, MD  ASSISTANT:  John Giovanni, PA-C  ANESTHESIA:  Glynda Jaeger, MD   ? LEFT HEART CATH AND CORS/GRAFTS ANGIOGRAPHY N/A 08/22/2017  ? Procedure: LEFT HEART CATH AND CORS/GRAFTS ANGIOGRAPHY;  Surgeon: Martinique, Peter M, MD;  Location: [facility];  Service: Cardiovascular;   Laterality: N/A;  ? SKIN CANCER EXCISION    ? "top of my head"  ? ? ?There were no vitals filed for this visit. ? ? Subjective Assessment - 11/27/21 0947   ? ? Subjective Pt arrives for today's treatment session denying any pain. Did ok after last Rx   ? Pertinent History hard of hearing   ? Limitations Standing;Walking   ? How long can you stand comfortably? 5-10 minutes   ? Patient Stated Goals play golf, work outside   ? Currently in Pain? No/denies   ? Pain Location Back   ? ?  ?  ? ?  ? ? ? ? ? ? ? ? ? ? ? ? ? ? ? ? ? ? ? ? [facility] Adult PT Treatment/Exercise - 11/27/21 0001   ? ?  ? Knee/Hip Exercises: Aerobic  ? Nustep Lvl 4 x 15 mins   ?  ? Knee/Hip Exercises: Standing  ? Hip Flexion Stengthening;Both;20 reps;Knee bent;2 sets   ? Hip Abduction Both;20 reps;Knee straight   ? Rocker Board 5 minutes   DF/PF balance CGA  ? ?  ?  ? ?  ? ? ? ? ? ? Balance Exercises - 11/27/21 0001   ? ?  ? Balance Exercises: Standing  ?  Tandem Stance Eyes open;30 secs;4 reps   cues to stand straight and eyes forward. CGA  ? Rockerboard --   3 mins, cues for pacing  ? Other Standing Exercises 6in onefoot on top x3 each foot with 30 second holds   ? ?  ?  ? ?  ? ? ? ? ? ? ? ? ? ? PT Long Term Goals - 10/08/21 1023   ? ?  ? PT LONG TERM GOAL #1  ? Title Patient will be independent with his HEP.   ? Time 5   ? Period Weeks   ? Status New   ? Target Date 11/12/21   ?  ? PT LONG TERM GOAL #2  ? Title Patient will be able to improve his 5x sit to stand time to 14 seconds or less for impoved safety.   ? Time 5   ? Period Weeks   ? Status New   ? Target Date 11/12/21   ?  ? PT LONG TERM GOAL #3  ? Title Patient will be able to safely navigate at least 80 feet without requiring support from his surroundings.   ? Time 5   ? Period Weeks   ? Status New   ? Target Date 11/12/21   ? ?  ?  ? ?  ? ? ? ? ? ? ? ? Plan - 11/27/21 1039   ? ? Clinical Impression Statement Pt arrived today feeling fairly well and was able to continue with LE strengthening  exs as well as balancing act.'s. Pt felt like he did better with balaning act's , but needs constant SBA/CGA assistance during balance exs. Pt looks down a lot and needs cues to look ahead and stand erect. Good job today.   ? Personal Factors and Comorbidities Age;Time since onset of injury/illness/exacerbation;Comorbidity 1;Comorbidity 2;Comorbidity 3+   ? Comorbidities CAD, angina, diabetes (type 2),   ? Examination-Activity Limitations Locomotion Level;Transfers;Carry;Stand;Stairs;Squat   ? Stability/Clinical Decision Making Evolving/Moderate complexity   ? Rehab Potential Fair   ? PT Frequency 2x / week   ? PT Duration Other (comment)   ? PT Treatment/Interventions ADLs/Self Care Home Management;Electrical Stimulation;Neuromuscular re-education;Balance training;Therapeutic exercise;Therapeutic activities;Functional mobility training;Stair training;Gait training;Patient/family education;Manual techniques;Vestibular   ? PT Next Visit Plan nustep, lower extremity strengthening, balance   ? Consulted and Agree with Plan of Care Patient   ? ?  ?  ? ?  ? ? ?Patient will benefit from skilled therapeutic intervention in order to improve the following deficits and impairments:  Abnormal gait, Difficulty walking, Decreased range of motion, Decreased safety awareness, Decreased activity tolerance, Decreased balance, Decreased mobility, Decreased strength ? ?Visit Diagnosis: ?Unsteadiness on feet ? ?Muscle weakness (generalized) ? ? ? ? ?Problem List ?Patient Active Problem List  ? Diagnosis Date Noted  ? Combined forms of age-related cataract of left eye 11/10/2018  ? Combined forms of age-related cataract of right eye 11/03/2018  ? Intraoperative floppy iris syndrome (IFIS) 11/03/2018  ? Urine stream spraying 02/23/2018  ? Flank pain 02/22/2018  ? Chest pain 08/21/2017  ? Unstable angina ([place]) 08/21/2017  ? Renal cyst 12/20/2016  ? B12 deficiency 11/08/2016  ? Type 2 diabetes mellitus with neurological complications ([place])  30/05/2329  ? Syncope and collapse 05/24/2015  ? Benign prostatic hyperplasia with urinary obstruction 12/12/2014  ? Carotid artery stenosis 05/22/2012  ? CAD (coronary artery disease) 05/14/2012  ? ED (erectile dysfunction) of organic origin 08/19/2011  ? FH: CABG (coronary artery bypass surgery) 01/28/2011  ?  Mixed hyperlipidemia 10/23/2010  ? GERD (gastroesophageal reflux disease) 10/22/2010  ? History of colonic polyps 06/14/2010  ? ? ?Sueann Brownley,CHRIS, PTA ?11/27/2021, 10:49 AM ? ?[place] ?[facility] ?[address] ?[place], [place], [address] ?Phone: [phone]   Fax:  [phone] ? ?Name: EMMITTE SURGEON ?MRN: [id number] ?Date of Birth: [birth year] ? ? ? ?

## 2021-11-29 ENCOUNTER — Ambulatory Visit: Payer: Medicare Other

## 2021-11-29 ENCOUNTER — Other Ambulatory Visit: Payer: Self-pay

## 2021-11-29 DIAGNOSIS — R2681 Unsteadiness on feet: Secondary | ICD-10-CM

## 2021-11-29 DIAGNOSIS — M6281 Muscle weakness (generalized): Secondary | ICD-10-CM | POA: Diagnosis not present

## 2021-11-29 NOTE — Therapy (Signed)
[place] ?[facility] ?[address] ?[place], [place], [address] ?Phone: [phone]   Fax:  [phone] ? ?Physical Therapy Treatment ? ?Patient Details  ?Name: Michael Johnston ?MRN: [id number] ?Date of Birth: 21-Nov-1931 ?Referring Provider (PT): Nafziger, Np ? ? ?Encounter Date: 11/29/2021 ? ? PT End of Session - 11/29/21 0948   ? ? Visit Number 8   ? Number of Visits 10   ? PT Start Time [phone]   ? PT Stop Time 1028   ? PT Time Calculation (min) 42 min   ? Activity Tolerance Patient tolerated treatment well   ? Behavior During Therapy [facility] for tasks assessed/performed   ? ?  ?  ? ?  ? ? ?Past Medical History:  ?Diagnosis Date  ? Arthritis   ? "right shoulder" (05/24/2015)  ? CHEST PAIN   ? Chronic bronchitis ([place])   ? "get it ~ q yr" (05/24/2015)  ? COLONIC POLYPS, HX OF   ? COPD (chronic obstructive pulmonary disease) ([place])   ? "dx'd but I don't take RX for it" (05/24/2015)  ? Coronary artery disease   ? Cystic kidney disease   ? GERD (gastroesophageal reflux disease)   ? History of hiatal hernia   ? MI (myocardial infarction) ([place]) 09/2010  ? Mixed hyperlipidemia   ? Skin cancer   ? "top of my head only" (05/24/2015)  ? Sleep apnea   ? Syncope and collapse ~ 2013; 05/24/2015  ? ? ?Past Surgical History:  ?Procedure Laterality Date  ? CARDIAC CATHETERIZATION  09/2010  ? CHOLECYSTECTOMY OPEN  1998  ? COLECTOMY  1998  ? "partial"  ? CORONARY ANGIOPLASTY    ? CORONARY ARTERY BYPASS GRAFT  09/2010  ? Median sternotomy for coronary artery bypass grafting x3  (left internal mammary artery to distal left anterior  descending  coronary artery, saphenous vein graft to first diagonal branch,  saphenous vein graft to first  obtuse marginal branch, endoscopic  saphenous vein harvest from right thigh). SURGEON:  Valentina Gu.  Roxy Manns, MD  ASSISTANT:  John Giovanni, PA-C  ANESTHESIA:  Glynda Jaeger, MD   ? LEFT HEART CATH AND CORS/GRAFTS ANGIOGRAPHY N/A 08/22/2017  ? Procedure: LEFT HEART CATH AND  CORS/GRAFTS ANGIOGRAPHY;  Surgeon: Martinique, Peter M, MD;  Location: [place] [facility];  Service: Cardiovascular;  Laterality: N/A;  ? SKIN CANCER EXCISION    ? "top of my head"  ? ? ?There were no vitals filed for this visit. ? ? Subjective Assessment - 11/29/21 0948   ? ? Subjective Patient reports that he was a little tired after his last appointment.   ? Pertinent History hard of hearing   ? Limitations Standing;Walking   ? How long can you stand comfortably? 5-10 minutes   ? Patient Stated Goals play golf, work outside   ? Currently in Pain? No/denies   ? ?  ?  ? ?  ? ? ? ? ? ? ? ? ? ? ? ? ? ? ? ? ? ? ? ? [place] Adult PT Treatment/Exercise - 11/29/21 0001   ? ?  ? Knee/Hip Exercises: Aerobic  ? Nustep Lvl 4 x 15 mins   ?  ? Knee/Hip Exercises: Standing  ? Rocker Board 3 minutes   ?  ? Knee/Hip Exercises: Seated  ? Long [employer] Both;2 sets;15 reps;Weights   ? Long Arc Quad Weight 4 lbs.   ? Clamshell with TheraBand Red   30 reps  ? Abduction/Adduction  Right;Left;20  reps   ? Abd/Adduction Limitations red t-band   ? ?  ?  ? ?  ? ? ? ? ? ? Balance Exercises - 11/29/21 0001   ? ?  ? Balance Exercises: Standing  ? Tandem Stance Eyes open;Intermittent upper extremity support;4 reps;30 secs   CGA  ? Marching Solid surface;Upper extremity assist 2;Static   3 minutes; with cone tap  ? ?  ?  ? ?  ? ? ? ? ? ? ? ? ? ? PT Long Term Goals - 10/08/21 1023   ? ?  ? PT LONG TERM GOAL #1  ? Title Patient will be independent with his HEP.   ? Time 5   ? Period Weeks   ? Status New   ? Target Date 11/12/21   ?  ? PT LONG TERM GOAL #2  ? Title Patient will be able to improve his 5x sit to stand time to 14 seconds or less for impoved safety.   ? Time 5   ? Period Weeks   ? Status New   ? Target Date 11/12/21   ?  ? PT LONG TERM GOAL #3  ? Title Patient will be able to safely navigate at least 80 feet without requiring support from his surroundings.   ? Time 5   ? Period Weeks   ? Status New   ? Target Date 11/12/21   ? ?  ?  ? ?   ? ? ? ? ? ? ? ? Plan - 11/29/21 1036   ? ? Clinical Impression Statement Patient was progressed with familiar interventions for improved lower extremity strength and stability with moderate difficulty and fatigue. He required minimal cuing for upright stance with today's standing activities to promote proper posture. He required multiple brief seated rest breaks. He reported feeling fatigued upon the conclusion of treatment. He continues to require skilled physical therapy to address his remaining impairments to maximize his functional mobility.   ? Personal Factors and Comorbidities Age;Time since onset of injury/illness/exacerbation;Comorbidity 1;Comorbidity 2;Comorbidity 3+   ? Comorbidities CAD, angina, diabetes (type 2),   ? Examination-Activity Limitations Locomotion Level;Transfers;Carry;Stand;Stairs;Squat   ? Stability/Clinical Decision Making Evolving/Moderate complexity   ? Rehab Potential Fair   ? PT Frequency 2x / week   ? PT Duration Other (comment)   ? PT Treatment/Interventions ADLs/Self Care Home Management;Electrical Stimulation;Neuromuscular re-education;Balance training;Therapeutic exercise;Therapeutic activities;Functional mobility training;Stair training;Gait training;Patient/family education;Manual techniques;Vestibular   ? PT Next Visit Plan nustep, lower extremity strengthening, balance   ? Consulted and Agree with Plan of Care Patient   ? ?  ?  ? ?  ? ? ?Patient will benefit from skilled therapeutic intervention in order to improve the following deficits and impairments:  Abnormal gait, Difficulty walking, Decreased range of motion, Decreased safety awareness, Decreased activity tolerance, Decreased balance, Decreased mobility, Decreased strength ? ?Visit Diagnosis: ?Unsteadiness on feet ? ?Muscle weakness (generalized) ? ? ? ? ?Problem List ?Patient Active Problem List  ? Diagnosis Date Noted  ? Combined forms of age-related cataract of left eye 11/10/2018  ? Combined forms of age-related  cataract of right eye 11/03/2018  ? Intraoperative floppy iris syndrome (IFIS) 11/03/2018  ? Urine stream spraying 02/23/2018  ? Flank pain 02/22/2018  ? Chest pain 08/21/2017  ? Unstable angina ([place]) 08/21/2017  ? Renal cyst 12/20/2016  ? B12 deficiency 11/08/2016  ? Type 2 diabetes mellitus with neurological complications ([place]) 16/06/9603  ? Syncope and collapse 05/24/2015  ? Benign prostatic hyperplasia with urinary  obstruction 12/12/2014  ? Carotid artery stenosis 05/22/2012  ? CAD (coronary artery disease) 05/14/2012  ? ED (erectile dysfunction) of organic origin 08/19/2011  ? FH: CABG (coronary artery bypass surgery) 01/28/2011  ? Mixed hyperlipidemia 10/23/2010  ? GERD (gastroesophageal reflux disease) 10/22/2010  ? History of colonic polyps 06/14/2010  ? ? ?Darlin Coco, PT ?11/29/2021, 10:56 AM ? ?[place] ?[facility] ?[address] ?[place], [place], [address] ?Phone: [phone]   Fax:  [phone] ? ?Name: KONNOR VONDRASEK ?MRN: [id number] ?Date of Birth: 1932-07-03 ? ? ? ?

## 2021-12-04 ENCOUNTER — Encounter: Payer: Self-pay | Admitting: Physical Therapy

## 2021-12-04 ENCOUNTER — Ambulatory Visit: Payer: Medicare Other | Admitting: Physical Therapy

## 2021-12-04 ENCOUNTER — Other Ambulatory Visit: Payer: Self-pay

## 2021-12-04 DIAGNOSIS — M6281 Muscle weakness (generalized): Secondary | ICD-10-CM

## 2021-12-04 DIAGNOSIS — R2681 Unsteadiness on feet: Secondary | ICD-10-CM | POA: Diagnosis not present

## 2021-12-04 NOTE — Therapy (Signed)
[place] ?[facility] ?[address] ?[place], [place], [address] ?Phone: [phone]   Fax:  [phone] ? ?Physical Therapy Treatment ? ?Patient Details  ?Name: Michael Johnston ?MRN: [id number] ?Date of Birth: 1932/09/03 ?Referring Provider (PT): Nafziger, Np ? ? ?Encounter Date: 12/04/2021 ? ? PT End of Session - 12/04/21 1001   ? ? Visit Number 9   ? Number of Visits 10   ? PT Start Time [phone]   ? PT Stop Time 1029   ? PT Time Calculation (min) 43 min   ? Activity Tolerance Patient tolerated treatment well   ? Behavior During Therapy [facility] for tasks assessed/performed   ? ?  ?  ? ?  ? ? ?Past Medical History:  ?Diagnosis Date  ? Arthritis   ? "right shoulder" (05/24/2015)  ? CHEST PAIN   ? Chronic bronchitis ([place])   ? "get it ~ q yr" (05/24/2015)  ? COLONIC POLYPS, HX OF   ? COPD (chronic obstructive pulmonary disease) ([place])   ? "dx'd but I don't take RX for it" (05/24/2015)  ? Coronary artery disease   ? Cystic kidney disease   ? GERD (gastroesophageal reflux disease)   ? History of hiatal hernia   ? MI (myocardial infarction) ([place]) 09/2010  ? Mixed hyperlipidemia   ? Skin cancer   ? "top of my head only" (05/24/2015)  ? Sleep apnea   ? Syncope and collapse ~ 2013; 05/24/2015  ? ? ?Past Surgical History:  ?Procedure Laterality Date  ? CARDIAC CATHETERIZATION  09/2010  ? CHOLECYSTECTOMY OPEN  1998  ? COLECTOMY  1998  ? "partial"  ? CORONARY ANGIOPLASTY    ? CORONARY ARTERY BYPASS GRAFT  09/2010  ? Median sternotomy for coronary artery bypass grafting x3  (left internal mammary artery to distal left anterior  descending  coronary artery, saphenous vein graft to first diagonal branch,  saphenous vein graft to first  obtuse marginal branch, endoscopic  saphenous vein harvest from right thigh). SURGEON:  Valentina Gu.  Roxy Manns, MD  ASSISTANT:  John Giovanni, PA-C  ANESTHESIA:  Glynda Jaeger, MD   ? LEFT HEART CATH AND CORS/GRAFTS ANGIOGRAPHY N/A 08/22/2017  ? Procedure: LEFT HEART CATH AND  CORS/GRAFTS ANGIOGRAPHY;  Surgeon: Martinique, Peter M, MD;  Location: [place] [facility];  Service: Cardiovascular;  Laterality: N/A;  ? SKIN CANCER EXCISION    ? "top of my head"  ? ? ?There were no vitals filed for this visit. ? ? Subjective Assessment - 12/04/21 1001   ? ? Subjective No new complaints.   ? Pertinent History hard of hearing   ? Limitations Standing;Walking   ? How long can you stand comfortably? 5-10 minutes   ? Patient Stated Goals play golf, work outside   ? Currently in Pain? No/denies   ? ?  ?  ? ?  ? ? ? ? ? [facility] PT Assessment - 12/04/21 0001   ? ?  ? Assessment  ? Medical Diagnosis Pinched Nerve   ? Referring Provider (PT) Nafziger, Np   ? Next MD Visit 12/2021   ? Prior Therapy None recently   ?  ? Precautions  ? Precautions Fall   ?  ? Restrictions  ? Weight Bearing Restrictions No   ? ?  ?  ? ?  ? ? ? ? ? ? ? ? ? ? ? ? ? ? ? ? [place] Adult PT Treatment/Exercise - 12/04/21 0001   ? ?  ? Knee/Hip  Exercises: Aerobic  ? Nustep Lvl 4 x 15 mins   ?  ? Knee/Hip Exercises: Seated  ? Long [employer] Strengthening;Both;20 reps;Weights   ? Long Arc Quad Weight 4 lbs.   ? [employer] Limitations 5 sec holds   ? Clamshell with TheraBand Red   x30 reps  ? Marching Strengthening;Both;20 reps;Weights   ? Marching Limitations red theraband   ? Hamstring Curl Strengthening;Both;20 reps;Limitations   ? Hamstring Limitations red theraband   ? Sit to Sand 15 reps;without UE support   ? ?  ?  ? ?  ? ? ? ? ? ? Balance Exercises - 12/04/21 0001   ? ?  ? Balance Exercises: Standing  ? Standing Eyes Opened Narrow base of support (BOS);Foam/compliant surface   ? Tandem Stance Eyes open;Upper extremity support 1   ? SLS Eyes open;Upper extremity support 2;Limitations   ? SLS Limitations hip abduction   ? Marching Foam/compliant surface;Upper extremity assist 1;Static;15 reps   ? Heel Raises Both;20 reps;10 reps   ? ?  ?  ? ?  ? ? ? ? ? ? ? ? ? ? PT Long Term Goals - 10/08/21 1023   ? ?  ? PT LONG TERM GOAL #1  ? Title  Patient will be independent with his HEP.   ? Time 5   ? Period Weeks   ? Status New   ? Target Date 11/12/21   ?  ? PT LONG TERM GOAL #2  ? Title Patient will be able to improve his 5x sit to stand time to 14 seconds or less for impoved safety.   ? Time 5   ? Period Weeks   ? Status New   ? Target Date 11/12/21   ?  ? PT LONG TERM GOAL #3  ? Title Patient will be able to safely navigate at least 80 feet without requiring support from his surroundings.   ? Time 5   ? Period Weeks   ? Status New   ? Target Date 11/12/21   ? ?  ?  ? ?  ? ? ? ? ? ? ? ? Plan - 12/04/21 1153   ? ? Clinical Impression Statement Patient presented in clinic with reports of no pain or limitations today. Patient progressed through strengthening as well as more complex balance activities in which UE support decreased to one UE support and occasionally two UE support if needed. Mod instability noted with head turns on airex as well as SLS. Patient fatigued by end of treatment.   ? Personal Factors and Comorbidities Age;Time since onset of injury/illness/exacerbation;Comorbidity 1;Comorbidity 2;Comorbidity 3+   ? Comorbidities CAD, angina, diabetes (type 2),   ? Examination-Activity Limitations Locomotion Level;Transfers;Carry;Stand;Stairs;Squat   ? Examination-Participation Restrictions Other;[employer] Work   ? Stability/Clinical Decision Making Evolving/Moderate complexity   ? Rehab Potential Fair   ? PT Frequency 2x / week   ? PT Duration Other (comment)   ? PT Treatment/Interventions ADLs/Self Care Home Management;Electrical Stimulation;Neuromuscular re-education;Balance training;Therapeutic exercise;Therapeutic activities;Functional mobility training;Stair training;Gait training;Patient/family education;Manual techniques;Vestibular   ? PT Next Visit Plan nustep, lower extremity strengthening, balance   ? Consulted and Agree with Plan of Care Patient   ? ?  ?  ? ?  ? ? ?Patient will benefit from skilled therapeutic intervention in order to  improve the following deficits and impairments:  Abnormal gait, Difficulty walking, Decreased range of motion, Decreased safety awareness, Decreased activity tolerance, Decreased balance, Decreased mobility, Decreased strength ? ?Visit Diagnosis: ?  Unsteadiness on feet ? ?Muscle weakness (generalized) ? ? ? ? ?Problem List ?Patient Active Problem List  ? Diagnosis Date Noted  ? Combined forms of age-related cataract of left eye 11/10/2018  ? Combined forms of age-related cataract of right eye 11/03/2018  ? Intraoperative floppy iris syndrome (IFIS) 11/03/2018  ? Urine stream spraying 02/23/2018  ? Flank pain 02/22/2018  ? Chest pain 08/21/2017  ? Unstable angina ([place]) 08/21/2017  ? Renal cyst 12/20/2016  ? B12 deficiency 11/08/2016  ? Type 2 diabetes mellitus with neurological complications ([place]) 61/95/0932  ? Syncope and collapse 05/24/2015  ? Benign prostatic hyperplasia with urinary obstruction 12/12/2014  ? Carotid artery stenosis 05/22/2012  ? CAD (coronary artery disease) 05/14/2012  ? ED (erectile dysfunction) of organic origin 08/19/2011  ? FH: CABG (coronary artery bypass surgery) 01/28/2011  ? Mixed hyperlipidemia 10/23/2010  ? GERD (gastroesophageal reflux disease) 10/22/2010  ? History of colonic polyps 06/14/2010  ? ? ?Standley Brooking, PTA ?12/04/2021, 11:57 AM ? ?[place] ?[facility] ?[place] ?[place], [place], [address] ?Phone: [phone]   Fax:  [phone] ? ?Name: Michael Johnston ?MRN: [id number] ?Date of Birth: 1932-04-30 ? ? ? ?

## 2021-12-06 ENCOUNTER — Encounter: Payer: Self-pay | Admitting: Physical Therapy

## 2021-12-06 ENCOUNTER — Ambulatory Visit: Payer: Medicare Other | Admitting: Physical Therapy

## 2021-12-06 DIAGNOSIS — M6281 Muscle weakness (generalized): Secondary | ICD-10-CM

## 2021-12-06 DIAGNOSIS — R2681 Unsteadiness on feet: Secondary | ICD-10-CM

## 2021-12-06 NOTE — Therapy (Addendum)
[facility] [place], [place], [address] Phone: [phone]   Fax:  [phone]  Physical Therapy Treatment  Patient Details  Name: Michael Johnston MRN: [id number] Date of Birth: 1931/11/01 Referring Provider (PT): Nafziger, Np   Encounter Date: 12/06/2021   PT End of Session - 12/06/21 0947     Visit Number 10    Number of Visits 10    PT Start Time 1749    PT Stop Time 1022    PT Time Calculation (min) 38 min    Activity Tolerance Patient tolerated treatment well    Behavior During Therapy [facility] for tasks assessed/performed             Past Medical History:  Diagnosis Date   Arthritis    "right shoulder" (05/24/2015)   CHEST PAIN    Chronic bronchitis ([place])    "get it ~ q yr" (05/24/2015)   COLONIC POLYPS, HX OF    COPD (chronic obstructive pulmonary disease) ([place])    "dx'd but I don't take RX for it" (05/24/2015)   Coronary artery disease    Cystic kidney disease    GERD (gastroesophageal reflux disease)    History of hiatal hernia    MI (myocardial infarction) ([place]) 09/2010   Mixed hyperlipidemia    Skin cancer    "top of my head only" (05/24/2015)   Sleep apnea    Syncope and collapse ~ 2013; 05/24/2015    Past Surgical History:  Procedure Laterality Date   CARDIAC CATHETERIZATION  09/2010   [place]   "partial"   CORONARY ANGIOPLASTY     CORONARY ARTERY BYPASS GRAFT  09/2010   [place]ian sternotomy for coronary artery bypass grafting x3  (left internal mammary artery to distal left anterior  descending  coronary artery, saphenous vein graft to first diagonal branch,  saphenous vein graft to first  obtuse marginal branch, endoscopic  saphenous vein harvest from right thigh). SURGEON:  Valentina Gu.  Roxy Manns, MD  ASSISTANT:  John Giovanni, PA-C  ANESTHESIA:  Glynda Jaeger, MD    LEFT HEART CATH AND CORS/GRAFTS ANGIOGRAPHY N/A 08/22/2017   Procedure: LEFT HEART CATH AND  CORS/GRAFTS ANGIOGRAPHY;  Surgeon: Martinique, Peter M, MD;  Location: [place] [facility];  Service: Cardiovascular;  Laterality: N/A;   SKIN CANCER EXCISION     "top of my head"    There were no vitals filed for this visit.   Subjective Assessment - 12/06/21 0946     Subjective No new complaints.    Pertinent History hard of hearing    Limitations Standing;Walking    How long can you stand comfortably? 5-10 minutes    Patient Stated Goals play golf, work outside    Currently in Pain? No/denies                [facility] PT Assessment - 12/06/21 0001       Assessment   Medical Diagnosis Pinched Nerve    Referring Provider (PT) Carlisle Cater, Np    Next MD Visit 12/2021    Prior Therapy None recently      Precautions   Precautions Fall                           [facility] Adult PT Treatment/Exercise - 12/06/21 0001       Knee/Hip Exercises: Aerobic   Nustep Lvl 5 x 15 mins  Knee/Hip Exercises: Standing   Heel Raises Both;20 reps    Hip Abduction AROM;Both;20 reps;Knee straight    Forward Step Up Both;20 reps      Knee/Hip Exercises: Seated   Long Arc Quad Strengthening;Both;20 reps;Weights    Long Arc Quad Weight 4 lbs.    Clamshell with TheraBand Red   x20 reps   Marching Strengthening;Both;20 reps;Weights    Marching Weights 4 lbs.    Hamstring Curl Strengthening;Both;20 reps;Limitations    Hamstring Limitations red theraband    Sit to Sand 5 reps;with UE support   13 sec                         PT Long Term Goals - 12/06/21 1001       PT LONG TERM GOAL #1   Title Patient will be independent with his HEP.    Time 5    Period Weeks    Status Unable to assess    Target Date 11/12/21      PT LONG TERM GOAL #2   Title Patient will be able to improve his 5x sit to stand time to 14 seconds or less for impoved safety.    Time 5    Period Weeks    Status Achieved    Target Date 11/12/21      PT LONG TERM GOAL #3   Title Patient  will be able to safely navigate at least 80 feet without requiring support from his surroundings.    Time 5    Period Weeks    Status Achieved    Target Date 11/12/21                   Plan - 12/06/21 1028     Clinical Impression Statement Patient presented in clinic with no new complaints but feels like his LEs are stronger. Still has some balance issues at times and has started using only his backdoor as there are less steps. Patient progressed through LE exercises with new added such step ups. Patient active at home and admits to fishing season coming soon. Patient okay with being on hold as he will be more active and can do the exercises from home.    Personal Factors and Comorbidities Age;Time since onset of injury/illness/exacerbation;Comorbidity 1;Comorbidity 2;Comorbidity 3+    Comorbidities CAD, angina, diabetes (type 2),    Examination-Activity Limitations Locomotion Level;Transfers;Carry;Stand;Stairs;Squat    Examination-Participation Restrictions Other;Yard Work    [employer]chant [employer] officer Evolving/Moderate complexity    Rehab Potential Fair    PT Frequency 2x / week    PT Duration Other (comment)    PT Treatment/Interventions ADLs/Self Care Home Management;Electrical Stimulation;Neuromuscular re-education;Balance training;Therapeutic exercise;Therapeutic activities;Functional mobility training;Stair training;Gait training;Patient/family education;Manual techniques;Vestibular    PT Next Visit Plan On hold    Consulted and Agree with Plan of Care Patient             Patient will benefit from skilled therapeutic intervention in order to improve the following deficits and impairments:  Abnormal gait, Difficulty walking, Decreased range of motion, Decreased safety awareness, Decreased activity tolerance, Decreased balance, Decreased mobility, Decreased strength  Visit Diagnosis: Unsteadiness on feet  Muscle weakness (generalized)     Problem  List Patient Active Problem List   Diagnosis Date Noted   Combined forms of age-related cataract of left eye 11/10/2018   Combined forms of age-related cataract of right eye 11/03/2018   Intraoperative floppy iris syndrome (IFIS) 11/03/2018   Urine  stream spraying 02/23/2018   Flank pain 02/22/2018   Chest pain 08/21/2017   Unstable angina ([place]) 08/21/2017   Renal cyst 12/20/2016   B12 deficiency 11/08/2016   Type 2 diabetes mellitus with neurological complications ([place]) 73/40/3709   Syncope and collapse 05/24/2015   Benign prostatic hyperplasia with urinary obstruction 12/12/2014   Carotid artery stenosis 05/22/2012   CAD (coronary artery disease) 05/14/2012   ED (erectile dysfunction) of organic origin 08/19/2011   FH: CABG (coronary artery bypass surgery) 01/28/2011   Mixed hyperlipidemia 10/23/2010   GERD (gastroesophageal reflux disease) 10/22/2010   History of colonic polyps 06/14/2010    Standley Brooking, PTA 12/06/2021, 10:36 AM  [facility] [place], [place], [address] Phone: [phone]   Fax:  [phone]  Name: Michael Johnston MRN: [id number] Date of Birth: [birth year]  PHYSICAL THERAPY DISCHARGE SUMMARY  Visits from Start of Care: 10  Current functional level related to goals / functional outcomes: Patient was able to meet most of his goals for physical therapy and is being discharged at this time.    Remaining deficits: None    Education / Equipment: HEP   Patient agrees to discharge. Patient goals were partially met. Patient is being discharged due to meeting the stated rehab goals.  Jacqulynn Cadet, PT, DPT

## 2021-12-12 NOTE — Progress Notes (Signed)
Patient ID: Michael Johnston, male   DOB: Jun 22, 1932, 88 y.o.   MRN: [id number] ? ? ? ?Michael Johnston is seen for F/U CAD SEMI after getting injections at the dentist office September 27, 2010. Subsequently found to have 3VD needing CABG: LIMA to LAD, SVG to D1, SVG to OM  RCA was not grafted  ?  ?Admitted 08/21/17 for chest pain  R/O CXR NAD no acute ECG changes Cath done by Dr Martinique 08/22/17 patent Grafts normal EF and EDP RCA with no obstructive Disease  ? ?Carotid 12/28/15 plaque no stenosis due for f/u ?Last TTE 05/25/15 EF 55-60% no valve disease  ? ?Seen in ER 09/07/19 with chest pain. Woke him up at 4;30 am 7/10 took 2 nitro and ASA pain lasted 20 minutes had more GERD and gas as well In ER no acute ECG changes and negative troponin x 2 Started on Prilosec by primary 09/17/19  ? ?Myovue 11/01/19 normal non ischemic EF 60% continue on medical Rx ? ?He is an avid reader of the Bible ?Does not believe in COVID vaccine and won't get it ? ?Has left foot drop needs bracing and has been falling more Had PT ?And is doing a bit better  ? ?Has a son and daughter that live near him and look in on him ?Still driving as is his wife ? ?Using ketoconazole for foot fungus. He is not on AAT or Ranexa  BP stable and not on norvasc or mineralocorticoid  ? ?No cardiac complaints  ? ?ROS: Denies fever, malais, weight loss, blurry vision, decreased visual acuity, cough, sputum, SOB, hemoptysis, pleuritic pain, palpitaitons, heartburn, abdominal pain, melena, lower extremity edema, claudication, or rash.  All other systems reviewed and negative ? ?General: ?BP 110/72   Pulse 62   Ht '5\' 11"'$  (1.803 m)   Wt 167 lb (75.8 kg)   SpO2 99%   BMI 23.29 kg/m?  ?Affect appropriate ?Healthy:  appears stated age ?HEENT: normal ?Neck supple with no adenopathy ?JVP normal no bruits no thyromegaly ?Lungs clear with no wheezing and good diaphragmatic motion ?Heart:  S1/S2 no murmur, no rub, gallop or click ?PMI normal post sternotomy  ?Abdomen: benighn, BS  positve, no tenderness, no AAA ?no bruit.  No HSM or HJR ?Distal pulses intact with no bruits ?No edema ?Neuro non-focal ?Skin warm and dry ?Left sided foot drop   ? ? ?Current Outpatient Medications  ?Medication Sig Dispense Refill  ? aspirin 81 MG tablet Take 81 mg by mouth daily.    ? atorvastatin (LIPITOR) 20 MG tablet TAKE 1 TABLET BY MOUTH EVERY DAY 90 tablet 3  ? benzonatate (TESSALON) 200 MG capsule Take 1 capsule (200 mg total) by mouth 2 (two) times daily as needed for cough. 20 capsule 0  ? colchicine 0.6 MG tablet Take 1 tablet (0.6 mg total) by mouth 2 (two) times daily. (Patient not taking: Reported on 12/19/2021) 60 tablet 0  ? cyanocobalamin (,VITAMIN B-12,) 1000 MCG/ML injection INJECT 1ML INTRAMUSCULARLY EVERY OTHER WEEK 3 mL 6  ? diclofenac Sodium (VOLTAREN) 1 % GEL Apply 2 g topically 4 (four) times daily as needed. 50 g 0  ? EPINEPHrine 0.3 mg/0.3 mL IJ SOAJ injection     ? ketoconazole (NIZORAL) 2 % shampoo USE TWICE WEEKLY 120 mL 3  ? metFORMIN (GLUCOPHAGE) 500 MG tablet Take 1 tablet (500 mg total) by mouth daily with breakfast. 90 tablet 1  ? metoprolol tartrate (LOPRESSOR) 25 MG tablet Take 1 tablet (25 mg total) by  mouth 2 (two) times daily. 180 tablet 3  ? nitroGLYCERIN (NITROSTAT) 0.4 MG SL tablet Place 1 tablet (0.4 mg total) under the tongue every 5 (five) minutes as needed for chest pain (3 doses max). 25 tablet 1  ? SYRINGE-NEEDLE, DISP, 3 ML (B-D 3CC LUER-LOK SYR 25GX1") 25G X 1" 3 ML MISC USE AS DIRCTED TO INJECT B12 3 each 3  ? ?No current facility-administered medications for this visit.  ? ? ?Allergies ? ?Levaquin [levofloxacin in d5w], Other, and Metformin and related ? ?Electrocardiogram:   12/19/2021 SR rate 56 LAD otherwise normal   ? ?Assessment and Plan ? ?CAD:  CABG 2012  Cath 08/22/17 patent grafts  Normal EF  Non ischemic myovue 11/01/19 EF 60% continue medical Rx  ? ?HTN:  Well controlled.  Continue current medications and low sodium Dash type diet.   ? ?Cholesterol is at  goal. 54 07/14/20 continue statin   ? ?DM:  Discussed low carb diet.  Target hemoglobin A1c is 6.5 or less.  Continue current medications. ? ?Carotid:  Duplex 12/28/15 plaque no stenosis given age observe given age no need to repeat  ? ?GERD:  Improved with prilosec f/u primary Low carb diet  ? ?Drug Interaction:  Ketoconazole using low dose shampoo not pills   ?  ?     ? ?F/U with me in a year  ? ?[name] ? ?

## 2021-12-15 ENCOUNTER — Other Ambulatory Visit: Payer: Self-pay | Admitting: Adult Health

## 2021-12-19 ENCOUNTER — Encounter: Payer: Self-pay | Admitting: Cardiovascular Disease

## 2021-12-19 ENCOUNTER — Other Ambulatory Visit: Payer: Self-pay | Admitting: Adult Health

## 2021-12-19 ENCOUNTER — Ambulatory Visit: Payer: Medicare Other | Admitting: Cardiovascular Disease

## 2021-12-19 VITALS — BP 110/72 | HR 62 | Ht 71.0 in | Wt 167.0 lb

## 2021-12-19 DIAGNOSIS — Z951 Presence of aortocoronary bypass graft: Secondary | ICD-10-CM | POA: Diagnosis not present

## 2021-12-19 DIAGNOSIS — E782 Mixed hyperlipidemia: Secondary | ICD-10-CM

## 2021-12-19 DIAGNOSIS — I10 Essential (primary) hypertension: Secondary | ICD-10-CM

## 2021-12-19 NOTE — Patient Instructions (Signed)
Medication Instructions:  ?NON CHANGES ?*If you need a refill on your cardiac medications before your next appointment, please call your pharmacy* ? ? ?Lab Work: ?NONE ?If you have labs (blood work) drawn today and your tests are completely normal, you will receive your results only by: ?MyChart Message (if you have MyChart) OR ?A paper copy in the mail ?If you have any lab test that is abnormal or we need to change your treatment, we will call you to review the results. ? ? ?Testing/Procedures: ?NONE ? ? ?Follow-Up: ?At [facility], you and your health needs are our priority.  As part of our continuing mission to provide you with exceptional heart care, we have created designated Provider Care Teams.  These Care Teams include your primary Cardiologist (physician) and Advanced Practice Providers (APPs -  Physician Assistants and Nurse Practitioners) who all work together to provide you with the care you need, when you need it. ? ?We recommend signing up for the patient portal called "MyChart".  Sign up information is provided on this After Visit Summary.  MyChart is used to connect with patients for Virtual Visits (Telemedicine).  Patients are able to view lab/test results, encounter notes, upcoming appointments, etc.  Non-urgent messages can be sent to your provider as well.   ?To learn more about what you can do with MyChart, go to [email or web address].   ? ?Your next appointment:   ?1 year(s) ? ?The format for your next appointment:   ?In Person ? ?Provider:   ?DR Johnsie Cancel   ? ? ?Other Instructions ?NONE ? ?Important Information About Sugar ? ? ? ? ?  ?

## 2021-12-26 DIAGNOSIS — L570 Actinic keratosis: Secondary | ICD-10-CM | POA: Diagnosis not present

## 2022-02-06 ENCOUNTER — Other Ambulatory Visit: Payer: Self-pay | Admitting: Adult Health

## 2022-02-06 DIAGNOSIS — E538 Deficiency of other specified B group vitamins: Secondary | ICD-10-CM

## 2022-02-07 ENCOUNTER — Encounter: Payer: Self-pay | Admitting: Adult Health

## 2022-02-07 ENCOUNTER — Ambulatory Visit (INDEPENDENT_AMBULATORY_CARE_PROVIDER_SITE_OTHER): Payer: Medicare Other | Admitting: Adult Health

## 2022-02-07 VITALS — BP 180/60 | HR 69 | Temp 97.8°F | Ht 71.0 in | Wt 169.0 lb

## 2022-02-07 DIAGNOSIS — E1149 Type 2 diabetes mellitus with other diabetic neurological complication: Secondary | ICD-10-CM

## 2022-02-07 DIAGNOSIS — R600 Localized edema: Secondary | ICD-10-CM | POA: Diagnosis not present

## 2022-02-07 LAB — CBC WITH DIFFERENTIAL/PLATELET
Basophils Absolute: 0 10*3/uL (ref 0.0–0.1)
Basophils Relative: 1 % (ref 0.0–3.0)
Eosinophils Absolute: 0.1 10*3/uL (ref 0.0–0.7)
Eosinophils Relative: 3.2 % (ref 0.0–5.0)
HCT: 37.8 % — ABNORMAL LOW (ref 39.0–52.0)
Hemoglobin: 12.6 g/dL — ABNORMAL LOW (ref 13.0–17.0)
Lymphocytes Relative: 33.7 % (ref 12.0–46.0)
Lymphs Abs: 1.5 10*3/uL (ref 0.7–4.0)
MCHC: 33.4 g/dL (ref 30.0–36.0)
MCV: 96.4 fl (ref 78.0–100.0)
Monocytes Absolute: 0.4 10*3/uL (ref 0.1–1.0)
Monocytes Relative: 8.9 % (ref 3.0–12.0)
Neutro Abs: 2.3 10*3/uL (ref 1.4–7.7)
Neutrophils Relative %: 53.2 % (ref 43.0–77.0)
Platelets: 97 10*3/uL — ABNORMAL LOW (ref 150.0–400.0)
RBC: 3.92 Mil/uL — ABNORMAL LOW (ref 4.22–5.81)
RDW: 13.9 % (ref 11.5–15.5)
WBC: 4.4 10*3/uL (ref 4.0–10.5)

## 2022-02-07 LAB — COMPREHENSIVE METABOLIC PANEL
ALT: 29 U/L (ref 0–53)
AST: 26 U/L (ref 0–37)
Albumin: 4.3 g/dL (ref 3.5–5.2)
Alkaline Phosphatase: 69 U/L (ref 39–117)
BUN: 23 mg/dL (ref 6–23)
CO2: 31 mEq/L (ref 19–32)
Calcium: 9.6 mg/dL (ref 8.4–10.5)
Chloride: 103 mEq/L (ref 96–112)
Creatinine, Ser: 0.84 mg/dL (ref 0.40–1.50)
GFR: 78.35 mL/min (ref >60.00)
Glucose, Bld: 121 mg/dL — ABNORMAL HIGH (ref 70–99)
Potassium: 4.6 mEq/L (ref 3.5–5.1)
Sodium: 140 mEq/L (ref 135–145)
Total Bilirubin: 0.5 mg/dL (ref 0.2–1.2)
Total Protein: 7 g/dL (ref 6.0–8.3)

## 2022-02-07 LAB — BRAIN NATRIURETIC PEPTIDE: Pro B Natriuretic peptide (BNP): 93 pg/mL (ref 0.0–100.0)

## 2022-02-07 LAB — URIC ACID: Uric Acid, Serum: 4.1 mg/dL (ref 4.0–7.8)

## 2022-02-07 LAB — HEMOGLOBIN A1C: Hgb A1c MFr Bld: 6.8 % — ABNORMAL HIGH (ref 4.6–6.5)

## 2022-02-07 NOTE — Progress Notes (Signed)
Subjective:    Patient ID: Michael Johnston, male    DOB: 06/28/1932, 88 y.o.   MRN: [id number]  HPI 88 year old male who  has a past medical history of Arthritis, CHEST PAIN, Chronic bronchitis ([place]), COLONIC POLYPS, HX OF, COPD (chronic obstructive pulmonary disease) ([place]), Coronary artery disease, Cystic kidney disease, GERD (gastroesophageal reflux disease), History of hiatal hernia, MI (myocardial infarction) ([place]) (09/2010), Mixed hyperlipidemia, Skin cancer, Sleep apnea, and Syncope and collapse (~ 2013; 05/24/2015).  He presents to the office today for an acute issue of left lower extremity edema.  Reports that he first noticed the edema roughly 2 weeks ago at which time it was sudden onset.  Had swelling from his ankle to his knee.  Some days worse than others.  Today he started developed a sharp pain throughout his left leg.  He has been elevating his leg which may have some effect on the amount of edema that he has.  He has no pain with walking, shortness of breath, or chest pain.  Has no history of DVT.  Has not taking any long trips by car, playing, or train.  Wt Readings from Last 3 Encounters:  02/07/22 169 lb (76.7 kg)  12/19/21 167 lb (75.8 kg)  10/29/21 181 lb (82.1 kg)   He is also due for A1c check due to history of diabetes mellitus. He is currently maintained on Metformin 500 mg daily. He checks his blood sugars about once a week and they are always 130 or below. He has not had any issued with hypoglycemia and reports that he watches his carb and sugar intake   Lab Results  Component Value Date   HGBA1C 6.9 (H) 07/17/2021      Review of Systems See HPI   Past Medical History:  Diagnosis Date   Arthritis    "right shoulder" (05/24/2015)   CHEST PAIN    Chronic bronchitis ([place])    "get it ~ q yr" (05/24/2015)   COLONIC POLYPS, HX OF    COPD (chronic obstructive pulmonary disease) ([place])    "dx'd but I don't take RX for it" (05/24/2015)   Coronary artery disease     Cystic kidney disease    GERD (gastroesophageal reflux disease)    History of hiatal hernia    MI (myocardial infarction) ([place]) 09/2010   Mixed hyperlipidemia    Skin cancer    "top of my head only" (05/24/2015)   Sleep apnea    Syncope and collapse ~ 2013; 05/24/2015    Social History   Socioeconomic History   Marital status: Married    Spouse name: Not on file   Number of children: Not on file   Years of education: Not on file   Highest education level: Not on file  Occupational History   Not on file  Tobacco Use   Smoking status: Former    Packs/day: 1.50    Years: 3.00    Pack years: 4.50    Types: Cigarettes    Quit date: 12/09/1951    Years since quitting: 70.2   Smokeless tobacco: Never  Vaping Use   Vaping Use: Never used  Substance and Sexual Activity   Alcohol use: Not Currently    Comment: "no alcohol since 1953"   Drug use: No   Sexual activity: Not on file  Other Topics Concern   Not on file  Social History Narrative   Retired    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 [place]  Social Determinants of Health   Financial Resource Strain: Low Risk    Difficulty of Paying Living Expenses: Not hard at all  Food Insecurity: No Food Insecurity   Worried About Charity fundraiser in the Last Year: Never true   [place] in the Last Year: Never true  Transportation Needs: No Transportation Needs   Lack of Transportation (Medical): No   Lack of Transportation (Non-Medical): No  Physical Activity: Insufficiently Active   Days of Exercise per Week: 2 days   Minutes of Exercise per Session: 40 min  Stress: No Stress Concern Present   Feeling of Stress : Not at all  Social Connections: Socially Integrated   Frequency of Communication with Friends and Family: More than three times a week   Frequency of Social Gatherings with Friends and Family: More than three times a week   Attends Religious Services: More than 4 times per year   Active Member of [employer] or  Organizations: Yes   Attends Music therapist: More than 4 times per year   Marital Status: Married  Human resources officer Violence: Not At Risk   Fear of Current or Ex-Partner: No   Emotionally Abused: No   Physically Abused: No   Sexually Abused: No    Past Surgical History:  Procedure Laterality Date   CARDIAC CATHETERIZATION  09/2010   [place]   "partial"   CORONARY ANGIOPLASTY     CORONARY ARTERY BYPASS GRAFT  09/2010   [facility]ian sternotomy for coronary artery bypass grafting x3  (left internal mammary artery to distal left anterior  descending  coronary artery, saphenous vein graft to first diagonal branch,  saphenous vein graft to first  obtuse marginal branch, endoscopic  saphenous vein harvest from right thigh). SURGEON:  Valentina Gu.  Roxy Manns, MD  ASSISTANT:  John Giovanni, PA-C  ANESTHESIA:  Glynda Jaeger, MD    LEFT HEART CATH AND CORS/GRAFTS ANGIOGRAPHY N/A 08/22/2017   Procedure: LEFT HEART CATH AND CORS/GRAFTS ANGIOGRAPHY;  Surgeon: Martinique, Peter M, MD;  Location: [facility];  Service: Cardiovascular;  Laterality: N/A;   SKIN CANCER EXCISION     "top of my head"    Family History  Problem Relation Age of Onset   Lung cancer Sister    Heart attack Father     Allergies  Allergen Reactions   Levaquin [Levofloxacin In D5w] Swelling and Other (See Comments)    Eyes swollen   Other Anaphylaxis and Other (See Comments)    Bee sting   Metformin And Related Nausea Only    Current Outpatient Medications on File Prior to Visit  Medication Sig Dispense Refill   aspirin 81 MG tablet Take 81 mg by mouth daily.     atorvastatin (LIPITOR) 20 MG tablet TAKE 1 TABLET BY MOUTH EVERY DAY 90 tablet 3   benzonatate (TESSALON) 200 MG capsule Take 1 capsule (200 mg total) by mouth 2 (two) times daily as needed for cough. 20 capsule 0   colchicine 0.6 MG tablet Take 1 tablet (0.6 mg total) by mouth 2 (two) times daily. 60 tablet 0    cyanocobalamin (,VITAMIN B-12,) 1000 MCG/ML injection INJECT 1ML INTRAMUSCULARLY EVERY OTHER WEEK 3 mL 6   diclofenac Sodium (VOLTAREN) 1 % GEL Apply 2 g topically 4 (four) times daily as needed. 50 g 0   EPINEPHrine 0.3 mg/0.3 mL IJ SOAJ injection      ketoconazole (NIZORAL) 2 %  shampoo USE TWICE WEEKLY 120 mL 3   metFORMIN (GLUCOPHAGE) 500 MG tablet Take 1 tablet (500 mg total) by mouth daily with breakfast. 90 tablet 1   metoprolol tartrate (LOPRESSOR) 25 MG tablet Take 1 tablet (25 mg total) by mouth 2 (two) times daily. 180 tablet 3   nitroGLYCERIN (NITROSTAT) 0.4 MG SL tablet Place 1 tablet (0.4 mg total) under the tongue every 5 (five) minutes as needed for chest pain (3 doses max). 25 tablet 1   SYRINGE-NEEDLE, DISP, 3 ML (B-D 3CC LUER-LOK SYR 25GX1") 25G X 1" 3 ML MISC USE AS DIRECTED TO INJECT B12 3 each 3   ketoconazole (NIZORAL) 2 % cream SMARTSIG:Topical 1-2 Times Daily PRN     No current facility-administered medications on file prior to visit.    BP (!) 180/60   Pulse 69   Temp 97.8 F (36.6 C) (Oral)   Ht '5\' 11"'$  (1.803 m)   Wt 169 lb (76.7 kg)   SpO2 99%   BMI 23.57 kg/m       Objective:   Physical Exam Vitals and nursing note reviewed.  Constitutional:      Appearance: Normal appearance.  Cardiovascular:     Rate and Rhythm: Normal rate and regular rhythm.     Pulses: Normal pulses.     Heart sounds: Normal heart sounds.     Comments: No calf pain, redness, or warmth   Pulmonary:     Effort: Pulmonary effort is normal.     Breath sounds: Normal breath sounds.  Musculoskeletal:        General: Normal range of motion.     Left lower leg: 2+ Pitting Edema (from ankle to mid shin) present.  Skin:    General: Skin is warm and dry.  Neurological:     General: No focal deficit present.     Mental Status: He is alert and oriented to person, place, and time.  Psychiatric:        Mood and Affect: Mood normal.        Behavior: Behavior normal.        Thought  Content: Thought content normal.        Judgment: Judgment normal.       Assessment & Plan:  1. Lower extremity edema -Rule out DVT due to sudden onset of unilateral lower extremity edema.  He states negative DVT then can consider short course of diuretics. - US Venous Img Lower Unilateral Left; Future - CBC with Differential/Platelet; Future - D-dimer, Quantitative; Future - Brain Natriuretic Peptide; Future - Comprehensive metabolic panel; Future - Uric Acid; Future  2. Type 2 diabetes mellitus with neurological complications (HCC) - Consider increase in metformin  - Hemoglobin A1c  Time of total for care on the day of the encounter 30 minutes. This includes time spent in both face-to-face and non-face-to-face activities including preparing for the visit, reviewing the chart, time spent with patient, evaluation and counseling patient/family/caregiver, coordinating care, and time spent documenting in the chart which was performed on the date of service (02/07/2022). Note: this excludes any time spent performing billable procedures or separate charges (such as time spent counseling smoking cessation); these charges are billed separately.    Dorothyann Peng, NP

## 2022-02-08 ENCOUNTER — Emergency Department (HOSPITAL_COMMUNITY): Payer: Medicare Other

## 2022-02-08 ENCOUNTER — Encounter (HOSPITAL_COMMUNITY): Payer: Self-pay

## 2022-02-08 ENCOUNTER — Inpatient Hospital Stay (HOSPITAL_COMMUNITY)
Admission: EM | Admit: 2022-02-08 | Discharge: 2022-02-10 | DRG: 176 | Disposition: A | Discharge status: Home / Self Care | Payer: Medicare Other | Attending: Internal Medicine | Admitting: Internal Medicine

## 2022-02-08 ENCOUNTER — Other Ambulatory Visit: Payer: Medicare Other

## 2022-02-08 ENCOUNTER — Inpatient Hospital Stay (HOSPITAL_COMMUNITY): Payer: Medicare Other

## 2022-02-08 ENCOUNTER — Emergency Department (HOSPITAL_BASED_OUTPATIENT_CLINIC_OR_DEPARTMENT_OTHER): Payer: Medicare Other

## 2022-02-08 ENCOUNTER — Telehealth: Payer: Self-pay | Admitting: Adult Health

## 2022-02-08 ENCOUNTER — Other Ambulatory Visit: Payer: Self-pay

## 2022-02-08 DIAGNOSIS — Z7984 Long term (current) use of oral hypoglycemic drugs: Secondary | ICD-10-CM | POA: Diagnosis not present

## 2022-02-08 DIAGNOSIS — I2510 Atherosclerotic heart disease of native coronary artery without angina pectoris: Secondary | ICD-10-CM | POA: Diagnosis present

## 2022-02-08 DIAGNOSIS — E782 Mixed hyperlipidemia: Secondary | ICD-10-CM | POA: Diagnosis present

## 2022-02-08 DIAGNOSIS — Z7982 Long term (current) use of aspirin: Secondary | ICD-10-CM

## 2022-02-08 DIAGNOSIS — Z87891 Personal history of nicotine dependence: Secondary | ICD-10-CM | POA: Diagnosis not present

## 2022-02-08 DIAGNOSIS — I2693 Single subsegmental pulmonary embolism without acute cor pulmonale: Principal | ICD-10-CM | POA: Diagnosis present

## 2022-02-08 DIAGNOSIS — Z91030 Bee allergy status: Secondary | ICD-10-CM

## 2022-02-08 DIAGNOSIS — R9082 White matter disease, unspecified: Secondary | ICD-10-CM | POA: Diagnosis not present

## 2022-02-08 DIAGNOSIS — K219 Gastro-esophageal reflux disease without esophagitis: Secondary | ICD-10-CM | POA: Diagnosis not present

## 2022-02-08 DIAGNOSIS — Z881 Allergy status to other antibiotic agents status: Secondary | ICD-10-CM

## 2022-02-08 DIAGNOSIS — Z801 Family history of malignant neoplasm of trachea, bronchus and lung: Secondary | ICD-10-CM | POA: Diagnosis not present

## 2022-02-08 DIAGNOSIS — G4730 Sleep apnea, unspecified: Secondary | ICD-10-CM | POA: Diagnosis not present

## 2022-02-08 DIAGNOSIS — Z79899 Other long term (current) drug therapy: Secondary | ICD-10-CM

## 2022-02-08 DIAGNOSIS — I82401 Acute embolism and thrombosis of unspecified deep veins of right lower extremity: Secondary | ICD-10-CM | POA: Diagnosis not present

## 2022-02-08 DIAGNOSIS — D649 Anemia, unspecified: Secondary | ICD-10-CM | POA: Diagnosis present

## 2022-02-08 DIAGNOSIS — D696 Thrombocytopenia, unspecified: Secondary | ICD-10-CM | POA: Diagnosis not present

## 2022-02-08 DIAGNOSIS — J449 Chronic obstructive pulmonary disease, unspecified: Secondary | ICD-10-CM | POA: Diagnosis present

## 2022-02-08 DIAGNOSIS — Z85828 Personal history of other malignant neoplasm of skin: Secondary | ICD-10-CM

## 2022-02-08 DIAGNOSIS — M79662 Pain in left lower leg: Secondary | ICD-10-CM | POA: Diagnosis not present

## 2022-02-08 DIAGNOSIS — I82451 Acute embolism and thrombosis of right peroneal vein: Principal | ICD-10-CM

## 2022-02-08 DIAGNOSIS — Z888 Allergy status to other drugs, medicaments and biological substances status: Secondary | ICD-10-CM | POA: Diagnosis not present

## 2022-02-08 DIAGNOSIS — I82453 Acute embolism and thrombosis of peroneal vein, bilateral: Secondary | ICD-10-CM | POA: Diagnosis present

## 2022-02-08 DIAGNOSIS — I2694 Multiple subsegmental pulmonary emboli without acute cor pulmonale: Secondary | ICD-10-CM

## 2022-02-08 DIAGNOSIS — I6782 Cerebral ischemia: Secondary | ICD-10-CM | POA: Diagnosis not present

## 2022-02-08 DIAGNOSIS — I82441 Acute embolism and thrombosis of right tibial vein: Secondary | ICD-10-CM

## 2022-02-08 DIAGNOSIS — Z8249 Family history of ischemic heart disease and other diseases of the circulatory system: Secondary | ICD-10-CM | POA: Diagnosis not present

## 2022-02-08 DIAGNOSIS — I82442 Acute embolism and thrombosis of left tibial vein: Secondary | ICD-10-CM

## 2022-02-08 DIAGNOSIS — M7989 Other specified soft tissue disorders: Secondary | ICD-10-CM | POA: Diagnosis not present

## 2022-02-08 DIAGNOSIS — I82452 Acute embolism and thrombosis of left peroneal vein: Secondary | ICD-10-CM

## 2022-02-08 DIAGNOSIS — R9431 Abnormal electrocardiogram [ECG] [EKG]: Secondary | ICD-10-CM | POA: Diagnosis not present

## 2022-02-08 DIAGNOSIS — I252 Old myocardial infarction: Secondary | ICD-10-CM

## 2022-02-08 DIAGNOSIS — I2699 Other pulmonary embolism without acute cor pulmonale: Secondary | ICD-10-CM | POA: Diagnosis not present

## 2022-02-08 DIAGNOSIS — I82432 Acute embolism and thrombosis of left popliteal vein: Secondary | ICD-10-CM | POA: Diagnosis present

## 2022-02-08 DIAGNOSIS — Z951 Presence of aortocoronary bypass graft: Secondary | ICD-10-CM

## 2022-02-08 DIAGNOSIS — I82443 Acute embolism and thrombosis of tibial vein, bilateral: Secondary | ICD-10-CM | POA: Diagnosis present

## 2022-02-08 DIAGNOSIS — R404 Transient alteration of awareness: Secondary | ICD-10-CM | POA: Diagnosis not present

## 2022-02-08 DIAGNOSIS — I82539 Chronic embolism and thrombosis of unspecified popliteal vein: Secondary | ICD-10-CM | POA: Diagnosis not present

## 2022-02-08 LAB — CBC WITH DIFFERENTIAL/PLATELET
Abs Immature Granulocytes: 0.02 10*3/uL (ref 0.00–0.07)
Basophils Absolute: 0.1 10*3/uL (ref 0.0–0.1)
Basophils Relative: 1 %
Eosinophils Absolute: 0.2 10*3/uL (ref 0.0–0.5)
Eosinophils Relative: 3 %
HCT: 38 % — ABNORMAL LOW (ref 39.0–52.0)
Hemoglobin: 12.5 g/dL — ABNORMAL LOW (ref 13.0–17.0)
Immature Granulocytes: 0 %
Lymphocytes Relative: 36 %
Lymphs Abs: 1.8 10*3/uL (ref 0.7–4.0)
MCH: 32.7 pg (ref 26.0–34.0)
MCHC: 32.9 g/dL (ref 30.0–36.0)
MCV: 99.5 fL (ref 80.0–100.0)
Monocytes Absolute: 0.4 10*3/uL (ref 0.1–1.0)
Monocytes Relative: 8 %
Neutro Abs: 2.7 10*3/uL (ref 1.7–7.7)
Neutrophils Relative %: 52 %
Platelets: 93 10*3/uL — ABNORMAL LOW (ref 150–400)
RBC: 3.82 MIL/uL — ABNORMAL LOW (ref 4.22–5.81)
RDW: 13 % (ref 11.5–15.5)
WBC: 5.2 10*3/uL (ref 4.0–10.5)
nRBC: 0 % (ref 0.0–0.2)

## 2022-02-08 LAB — TROPONIN I (HIGH SENSITIVITY)
Troponin I (High Sensitivity): 7 ng/L (ref <18)
Troponin I (High Sensitivity): 9 ng/L (ref <18)

## 2022-02-08 LAB — BASIC METABOLIC PANEL
Anion gap: 6 (ref 5–15)
BUN: 21 mg/dL (ref 8–23)
CO2: 28 mmol/L (ref 22–32)
Calcium: 9.2 mg/dL (ref 8.9–10.3)
Chloride: 104 mmol/L (ref 98–111)
Creatinine, Ser: 0.77 mg/dL (ref 0.61–1.24)
GFR, Estimated: >60 mL/min (ref >60)
Glucose, Bld: 144 mg/dL — ABNORMAL HIGH (ref 70–99)
Potassium: 4 mmol/L (ref 3.5–5.1)
Sodium: 138 mmol/L (ref 135–145)

## 2022-02-08 LAB — HEPATIC FUNCTION PANEL
ALT: 26 U/L (ref 0–44)
AST: 22 U/L (ref 15–41)
Albumin: 3.5 g/dL (ref 3.5–5.0)
Alkaline Phosphatase: 60 U/L (ref 38–126)
Bilirubin, Direct: 0.2 mg/dL (ref 0.0–0.2)
Indirect Bilirubin: 0.2 mg/dL — ABNORMAL LOW (ref 0.3–0.9)
Total Bilirubin: 0.4 mg/dL (ref 0.3–1.2)
Total Protein: 6.3 g/dL — ABNORMAL LOW (ref 6.5–8.1)

## 2022-02-08 LAB — HEPARIN LEVEL (UNFRACTIONATED): Heparin Unfractionated: 0.72 IU/mL — ABNORMAL HIGH (ref 0.30–0.70)

## 2022-02-08 LAB — BRAIN NATRIURETIC PEPTIDE: B Natriuretic Peptide: 62.6 pg/mL (ref 0.0–100.0)

## 2022-02-08 LAB — PROTIME-INR
INR: 1.1 (ref 0.8–1.2)
Prothrombin Time: 13.9 seconds (ref 11.4–15.2)

## 2022-02-08 LAB — D-DIMER, QUANTITATIVE: D-Dimer, Quant: 11.01 mcg/mL FEU — ABNORMAL HIGH (ref <0.50)

## 2022-02-08 LAB — MAGNESIUM: Magnesium: 2.1 mg/dL (ref 1.7–2.4)

## 2022-02-08 LAB — GLUCOSE, CAPILLARY: Glucose-Capillary: 202 mg/dL — ABNORMAL HIGH (ref 70–99)

## 2022-02-08 MED ORDER — HYDRALAZINE HCL 25 MG PO TABS: 25.0000 mg | ORAL_TABLET | Freq: Three times a day (TID) | ORAL | Status: DC | PRN

## 2022-02-08 MED ORDER — IOHEXOL 350 MG/ML SOLN
65.0000 mL | Freq: Once | INTRAVENOUS | Status: AC | PRN
  Administered 2022-02-08: 65 mL via INTRAVENOUS

## 2022-02-08 MED ORDER — HEPARIN (PORCINE) 25000 UT/250ML-% IV SOLN
1200.0000 [IU]/h | INTRAVENOUS | Status: DC
  Administered 2022-02-08: 1400 [IU]/h via INTRAVENOUS
  Administered 2022-02-09: 1200 [IU]/h via INTRAVENOUS
  Administered 2022-02-09: 1400 [IU]/h via INTRAVENOUS
  Filled 2022-02-08 (×3): qty 250

## 2022-02-08 MED ORDER — SODIUM CHLORIDE 0.9 % IV SOLN: INTRAVENOUS | Status: DC

## 2022-02-08 MED ORDER — ASPIRIN 81 MG PO TBEC
81.0000 mg | DELAYED_RELEASE_TABLET | Freq: Every day | ORAL | Status: DC
  Administered 2022-02-08 – 2022-02-10 (×3): 81 mg via ORAL
  Filled 2022-02-08 (×5): qty 1

## 2022-02-08 MED ORDER — HEPARIN BOLUS VIA INFUSION
4500.0000 [IU] | Freq: Once | INTRAVENOUS | Status: AC
  Administered 2022-02-08: 4500 [IU] via INTRAVENOUS
  Filled 2022-02-08: qty 4500

## 2022-02-08 NOTE — ED Provider Triage Note (Signed)
Emergency Medicine Provider Triage Evaluation Note  JOZIAH DOLLINS , a 88 y.o. male  was evaluated in triage.  Pt complains of abnormal lab. Pt had pain and swelling to R leg several weeks ago, was treated for suspected gout with medication with some improvement.  Now having pain and swelling to L leg x 2 weeks.  Seen by PCP and had blood work done yesterday.  Was notified today that D-dimer is 11.  No prior hx of PE/DVT.  Denies active cp or sob  Review of Systems  Positive: As above Negative: As above  Physical Exam  BP (!) 180/85 (BP Location: Right Arm)   Pulse 62   Temp 97.7 F (36.5 C) (Oral)   Resp 16   Ht '5\' 11"'$  (1.803 m)   Wt 76.7 kg   SpO2 99%   BMI 23.57 kg/m  Gen:   Awake, no distress   Resp:  Normal effort  MSK:   Moves extremities without difficulty  Other:  1+ pitting edema to BLE with minimal calves tenderness.  Medical Decision Making  Medically screening exam initiated at 12:44 PM.  Appropriate orders placed.  ELIHU MILSTEIN was informed that the remainder of the evaluation will be completed by another provider, this initial triage assessment does not replace that evaluation, and the importance of remaining in the ED until their evaluation is complete.     Domenic Moras, PA-C 02/08/22 1250

## 2022-02-08 NOTE — ED Triage Notes (Addendum)
Has elevated d dimer at 11 and having left leg pain.  Denies sob. Patient also does have hx of gout. Patient has DVT study scheduled today but son didn't want to wait for results and wanted to make sure he doesn't have any PE

## 2022-02-08 NOTE — Progress Notes (Signed)
[place] for Heparin Indication: pulmonary embolus and DVT Brief A/P: Heparin level slightly supratherapeutic Continue Heparin at current rate for now  Allergies  Allergen Reactions   Levaquin [Levofloxacin In D5w] Swelling and Other (See Comments)    Eyes swollen   Other Anaphylaxis and Other (See Comments)    Bee sting   Metformin And Related Nausea Only    Patient Measurements: Height: '5\' 11"'$  (180.3 cm) Weight: 76.7 kg (169 lb) IBW/kg (Calculated) : 75.3 Heparin Dosing Weight: 76.7 kg  Vital Signs: Temp: 98 F (36.7 C) (06/02 2039) Temp Source: Oral (06/02 2039) BP: 134/67 (06/02 2039) Pulse Rate: 66 (06/02 2039)  Labs: Recent Labs    02/07/22 1141 02/08/22 1255 02/08/22 1538 02/08/22 1853 02/08/22 2242  HGB 12.6* 12.5*  --   --   --   HCT 37.8* 38.0*  --   --   --   PLT 97.0* 93*  --   --   --   LABPROT  --   --  13.9  --   --   INR  --   --  1.1  --   --   HEPARINUNFRC  --   --   --   --  0.72*  CREATININE 0.84 0.77  --   --   --   TROPONINIHS  --   --  7 9  --      Estimated Creatinine Clearance: 69.3 mL/min (by C-G formula based on SCr of 0.77 mg/dL).  Assessment: 88 y.o. male with PE for heparin.    Goal of Therapy:  Heparin level 0.3-0.7 units/ml Monitor platelets by anticoagulation protocol: Yes   Plan:  Continue Heparin at current rate for now Check heparin level in 8 hours and adjust at that time if outside goal range  Phillis Knack, PharmD, BCPS

## 2022-02-08 NOTE — ED Provider Notes (Signed)
[facility] EMERGENCY DEPARTMENT Provider Note   CSN: [id number] Arrival date & time: 02/08/22  1225     History  Chief Complaint  Patient presents with   Abnormal Lab   Leg Pain    Michael Johnston is a 88 y.o. male with a past medical history of CAD, MI status post CABG 2022 presenting today due to elevated D-dimer.  A month ago patient was having right leg swelling and presented to his PCP.  He was treated for gout however 2 weeks later he started to have pain and swelling to his left lower extremity.  He saw PCP yesterday who had labs and stated that his D-dimer was elevated.  He came to the emergency department to have imaging to rule out DVT/PE.  He has no history of this, does not smoke, has never had DVT/PE, no recent surgery or travel.  Does not drink alcohol or have any history of liver disease.  Is on aspirin however no anticoagulants after his CABG 10 years ago.   Abnormal Lab Leg Pain     Home Medications Prior to Admission medications   Medication Sig Start Date End Date Taking? Authorizing Provider  aspirin 81 MG tablet Take 81 mg by mouth daily.    [provider]  atorvastatin (LIPITOR) 20 MG tablet TAKE 1 TABLET BY MOUTH EVERY DAY 07/17/21   Nafziger, Tommi Rumps, NP  colchicine 0.6 MG tablet Take 1 tablet (0.6 mg total) by mouth 2 (two) times daily. 12/17/21   Nafziger, Tommi Rumps, NP  cyanocobalamin (,VITAMIN B-12,) 1000 MCG/ML injection INJECT 1ML INTRAMUSCULARLY EVERY OTHER WEEK 10/03/21   Nafziger, Tommi Rumps, NP  diclofenac Sodium (VOLTAREN) 1 % GEL Apply 2 g topically 4 (four) times daily as needed. 09/15/20   Long, Wonda Olds, MD  EPINEPHrine 0.3 mg/0.3 mL IJ SOAJ injection  03/06/18   [provider]  ketoconazole (NIZORAL) 2 % cream SMARTSIG:Topical 1-2 Times Daily PRN 01/24/22   [provider]  ketoconazole (NIZORAL) 2 % shampoo USE TWICE WEEKLY 05/18/21   Nafziger, Tommi Rumps, NP  metFORMIN (GLUCOPHAGE) 500 MG tablet Take 1 tablet (500 mg  total) by mouth daily with breakfast. 07/17/21   Nafziger, Tommi Rumps, NP  metoprolol tartrate (LOPRESSOR) 25 MG tablet Take 1 tablet (25 mg total) by mouth 2 (two) times daily. 07/17/21   Nafziger, Tommi Rumps, NP  nitroGLYCERIN (NITROSTAT) 0.4 MG SL tablet Place 1 tablet (0.4 mg total) under the tongue every 5 (five) minutes as needed for chest pain (3 doses max). 01/31/16   Josue Hector, MD  SYRINGE-NEEDLE, DISP, 3 ML (B-D 3CC LUER-LOK SYR 25GX1") 25G X 1" 3 ML MISC USE AS DIRECTED TO INJECT B12 02/07/22   Nafziger, Tommi Rumps, NP      Allergies    Levaquin [levofloxacin in d5w], Other, and Metformin and related    Review of Systems   Review of Systems  Physical Exam Updated Vital Signs BP (!) 152/75 (BP Location: Left Arm)   Pulse (!) 58   Temp (!) 97.4 F (36.3 C) (Oral)   Resp 17   Ht '5\' 11"'$  (1.803 m)   Wt 76.7 kg   SpO2 98%   BMI 23.57 kg/m  Physical Exam Vitals and nursing note reviewed.  Constitutional:      Appearance: Normal appearance. He is not ill-appearing.  HENT:     Head: Normocephalic and atraumatic.  Eyes:     General: No scleral icterus.    Conjunctiva/sclera: Conjunctivae normal.     Pupils:  Pupils are equal, round, and reactive to light.  Cardiovascular:     Rate and Rhythm: Normal rate and regular rhythm.  Pulmonary:     Effort: Pulmonary effort is normal. No respiratory distress.  Skin:    General: Skin is warm and dry.     Findings: No rash.  Neurological:     General: No focal deficit present.     Mental Status: He is alert and oriented to person, place, and time.     Cranial Nerves: No cranial nerve deficit.  Psychiatric:        Mood and Affect: Mood normal.    ED Results / Procedures / Treatments   Labs (all labs ordered are listed, but only abnormal results are displayed) Labs Reviewed  BASIC METABOLIC PANEL - Abnormal; Notable for the following components:      Result Value   Glucose, Bld 144 (*)    All other components within normal limits  CBC WITH  DIFFERENTIAL/PLATELET - Abnormal; Notable for the following components:   RBC 3.82 (*)    Hemoglobin 12.5 (*)    HCT 38.0 (*)    Platelets 93 (*)    All other components within normal limits  HEPATIC FUNCTION PANEL - Abnormal; Notable for the following components:   Total Protein 6.3 (*)    Indirect Bilirubin 0.2 (*)    All other components within normal limits  BRAIN NATRIURETIC PEPTIDE  MAGNESIUM  PROTIME-INR  HEPARIN LEVEL (UNFRACTIONATED)  TROPONIN I (HIGH SENSITIVITY)    EKG None  Radiology CT Head Wo Contrast  Result Date: 02/08/2022 CLINICAL DATA:  Stroke suspected EXAM: CT HEAD WITHOUT CONTRAST TECHNIQUE: Contiguous axial images were obtained from the base of the skull through the vertex without intravenous contrast. RADIATION DOSE REDUCTION: This exam was performed according to the departmental dose-optimization program which includes automated exposure control, adjustment of the mA and/or kV according to patient size and/or use of iterative reconstruction technique. COMPARISON:  Prior CT scan of the head 05/24/2015 FINDINGS: Brain: No evidence of acute infarction, hemorrhage, hydrocephalus, extra-axial collection or mass lesion/mass effect. Periventricular white matter hypoattenuation most consistent with chronic microvascular ischemic white matter disease. Vascular: No hyperdense vessel or unexpected calcification. Skull: Normal. Negative for fracture or focal lesion. Sinuses/Orbits: No acute finding. Other: None. IMPRESSION: 1. No acute intracranial abnormality. 2. Chronic microvascular ischemic white matter disease. Electronically Signed   By: Jacqulynn Cadet M.D.   On: 02/08/2022 15:56   CT Angio Chest PE W and/or Wo Contrast  Result Date: 02/08/2022 CLINICAL DATA:  Bilateral leg pain and swelling with elevated D-dimer. Denies chest pain or shortness of breath. EXAM: CT ANGIOGRAPHY CHEST WITH CONTRAST TECHNIQUE: Multidetector CT imaging of the chest was performed using the  standard protocol during bolus administration of intravenous contrast. Multiplanar CT image reconstructions and MIPs were obtained to evaluate the vascular anatomy. RADIATION DOSE REDUCTION: This exam was performed according to the departmental dose-optimization program which includes automated exposure control, adjustment of the mA and/or kV according to patient size and/or use of iterative reconstruction technique. CONTRAST:  28m OMNIPAQUE IOHEXOL 350 MG/ML SOLN COMPARISON:  CT chest dated April 05, 2008. FINDINGS: Cardiovascular: Satisfactory opacification of the pulmonary arteries to the segmental level. Acute subsegmental pulmonary emboli in the anterior right upper lobe (series 5, image 66), posterior right lower lobe (series 5, image 123). Clot burden is small. Normal heart size status post CABG. No pericardial effusion. No thoracic aortic aneurysm. Coronary, aortic arch, and branch vessel atherosclerotic vascular disease.  Mediastinum/Nodes: Prominent reactive subcentimeter mediastinal lymph nodes are unchanged since 2009. No enlarged mediastinal, hilar, or axillary lymph nodes. Thyroid gland, trachea, and esophagus demonstrate no significant findings. Lungs/Pleura: Two 5 mm nodules in the right lower lobe are unchanged since 2009, benign. No follow-up imaging is recommended. No focal consolidation, pleural effusion, or pneumothorax. Upper Abdomen: No acute abnormality. Bilateral renal simple cysts, measuring up to 11.4 cm on the left. No follow-up imaging is recommended. Musculoskeletal: No chest wall abnormality. No acute or significant osseous findings. Review of the MIP images confirms the above findings. IMPRESSION: 1. Acute subsegmental pulmonary emboli in the right upper and lower lobes. Clot burden is small. 2. Aortic Atherosclerosis (ICD10-I70.0). Electronically Signed   By: Titus Dubin M.D.   On: 02/08/2022 15:41   VAS [place] LOWER EXTREMITY VENOUS (DVT) (7a-7p)  Result Date: 02/08/2022  Lower  Venous DVT Study Patient Name:  Michael Johnston  Date of Exam:   02/08/2022 Medical Rec #: [id number]          Accession #:    [id number] Date of Birth: 05-Nov-1931         Patient Gender: M Patient Age:   67 years Exam Location:  [facility] Procedure:      VAS [place] LOWER EXTREMITY VENOUS (DVT) Referring Phys: Domenic Moras --------------------------------------------------------------------------------  Indications: Pain, and Swelling in left calf for 2 weeks.  Comparison Study: No priors. Performing Technologist: Oda Cogan RDMS, RVT  Examination Guidelines: A complete evaluation includes B-mode imaging, spectral Doppler, color Doppler, and power Doppler as needed of all accessible portions of each vessel. Bilateral testing is considered an integral part of a complete examination. Limited examinations for reoccurring indications may be performed as noted. The reflux portion of the exam is performed with the patient in reverse Trendelenburg.  +---------+---------------+---------+-----------+----------+-----------------+ RIGHT    CompressibilityPhasicitySpontaneityPropertiesThrombus Aging    +---------+---------------+---------+-----------+----------+-----------------+ CFV      Full           Yes      Yes                                    +---------+---------------+---------+-----------+----------+-----------------+ SFJ      Full                                                           +---------+---------------+---------+-----------+----------+-----------------+ FV Prox  Full                                                           +---------+---------------+---------+-----------+----------+-----------------+ FV Mid   Full                                                           +---------+---------------+---------+-----------+----------+-----------------+ FV DistalFull                                                            +---------+---------------+---------+-----------+----------+-----------------+  PFV      Full                                                           +---------+---------------+---------+-----------+----------+-----------------+ POP      Full           Yes      Yes                                    +---------+---------------+---------+-----------+----------+-----------------+ PTV      Partial                                      Age Indeterminate +---------+---------------+---------+-----------+----------+-----------------+ PERO     Partial                                      Age Indeterminate +---------+---------------+---------+-----------+----------+-----------------+   +---------+---------------+---------+-----------+----------+-----------------+ LEFT     CompressibilityPhasicitySpontaneityPropertiesThrombus Aging    +---------+---------------+---------+-----------+----------+-----------------+ CFV      Full           Yes      Yes                                    +---------+---------------+---------+-----------+----------+-----------------+ SFJ      Full                                                           +---------+---------------+---------+-----------+----------+-----------------+ FV Prox  Full                                                           +---------+---------------+---------+-----------+----------+-----------------+ FV Mid   Full                                                           +---------+---------------+---------+-----------+----------+-----------------+ FV DistalFull                                                           +---------+---------------+---------+-----------+----------+-----------------+ PFV      Full                                                           +---------+---------------+---------+-----------+----------+-----------------+  POP      None           No       No                    Acute             +---------+---------------+---------+-----------+----------+-----------------+ PTV      None                                         Age Indeterminate +---------+---------------+---------+-----------+----------+-----------------+ PERO     None                                         Age Indeterminate +---------+---------------+---------+-----------+----------+-----------------+     Summary: RIGHT: - Findings consistent with age indeterminate deep vein thrombosis involving the right posterior tibial veins, and right peroneal veins.  LEFT: - Findings consistent with acute deep vein thrombosis involving the left popliteal vein. - Findings consistent with age indeterminate deep vein thrombosis involving the left posterior tibial veins, and left peroneal veins.  *See table(s) above for measurements and observations. Electronically signed by Deitra Mayo MD on 02/08/2022 at 3:35:29 PM.    Final     Procedures Procedures   Medications Ordered in ED Medications  heparin ADULT infusion 100 units/mL (25000 units/23m) (1,400 Units/hr Intravenous New Bag/Given 02/08/22 1612)  iohexol (OMNIPAQUE) 350 MG/ML injection 65 mL (65 mLs Intravenous Contrast Given 02/08/22 1523)  heparin bolus via infusion 4,500 Units (4,500 Units Intravenous Bolus from Bag 02/08/22 1612)    ED Course/ Medical Decision Making/ A&P                           Medical Decision Making Amount and/or Complexity of Data Reviewed Labs: ordered. Radiology: ordered.  Risk Prescription drug management.   88year old male presenting with a elevated D-dimer.  Imaging revealing of bilateral DVT and right-sided PE.  When patient was first placed in the room after being at [employer] son came out yelling saying that the patient is not responding.  This is not his baseline.  When I was at bedside, patient was jerking and shivering.  Does not appear to be seizure-like activity.  He was not responding to my  questions but was following commands such as finger grip, opening eyes, sticking tongue out.  Per chart review, patient supplied his own history in triage.   4:10p: I went and reevaluated the patient and he said he felt fine.  He is alert and oriented and denies any pain or trouble breathing.  He says that he thinks his "spell" was caused by feeling funny" after the dye from the CT.  Past Medical History / Co-morbidities / Social History: MI, CABG   Additional history: Additional history obtained from chart review.  PCP plan to do outpatient ultrasound and if this was negative start on diuretics for leg swelling.  A1c 6.8, baseline.   Physical Exam: Physical exam performed. The pertinent findings include: Mildly bradycardic  Lab Tests: I ordered, and personally interpreted labs.  The pertinent results include: No pertinent   Imaging Studies: I ordered imaging studies including DVT study, CTPA and CThead. I independently visualized and interpreted imaging which showed bilateral dvt and right-sided PEs. I agree with the  radiologist interpretation.   Cardiac Monitoring:  The patient was maintained on a cardiac monitor.  My attending physician Dr. Almyra Free viewed and interpreted the cardiac monitored which showed an underlying rhythm of: NSR   Medications: I ordered medication including IV heparin per pharmacy.    Disposition: After consideration of the diagnostic results and the patients response to treatment, I feel that patient requires admission in the setting of bilateral DVT/PE and heparin drip.  He is hesitant but agreeable at this time.   I discussed this case with my attending physician Dr. Almyra Free who the patient, cosigned this note including patient's presenting symptoms, physical exam, and planned diagnostics and interventions. Attending physician stated agreement with plan or made changes to plan which were implemented.      Final Clinical Impression(s) / ED Diagnoses Final  diagnoses:  Deep venous thrombosis (DVT) of right peroneal vein, unspecified chronicity (HCC)  Deep vein thrombosis (DVT) of tibial vein of right lower extremity, unspecified chronicity (HCC)  Acute deep vein thrombosis (DVT) of popliteal vein of left lower extremity (HCC)  Deep vein thrombosis (DVT) of tibial vein of left lower extremity, unspecified chronicity (HCC)  Deep venous thrombosis (DVT) of left peroneal vein, unspecified chronicity ([place])    Rx / DC Orders  Admit to Dr. Karleen Hampshire with [facility]   Rhae Hammock, PA-C 02/08/22 1637    Luna Fuse, MD 02/17/22 2206

## 2022-02-08 NOTE — Progress Notes (Addendum)
ANTICOAGULATION CONSULT NOTE - Initial Consult  Pharmacy Consult for Heparin Indication: pulmonary embolus and DVT  Allergies  Allergen Reactions   Levaquin [Levofloxacin In D5w] Swelling and Other (See Comments)    Eyes swollen   Other Anaphylaxis and Other (See Comments)    Bee sting   Metformin And Related Nausea Only    Patient Measurements: Height: '5\' 11"'$  (180.3 cm) Weight: 76.7 kg (169 lb) IBW/kg (Calculated) : 75.3 Heparin Dosing Weight: 76.7 kg  Vital Signs: Temp: 97.4 F (36.3 C) (06/02 1419) Temp Source: Oral (06/02 1419) BP: 152/75 (06/02 1419) Pulse Rate: 58 (06/02 1419)  Labs: Recent Labs    02/07/22 1141 02/08/22 1255  HGB 12.6* 12.5*  HCT 37.8* 38.0*  PLT 97.0* 93*  CREATININE 0.84 0.77    Estimated Creatinine Clearance: 69.3 mL/min (by C-G formula based on SCr of 0.77 mg/dL).   Medical History: Past Medical History:  Diagnosis Date   Arthritis    "right shoulder" (05/24/2015)   CHEST PAIN    Chronic bronchitis ([place])    "get it ~ q yr" (05/24/2015)   COLONIC POLYPS, HX OF    COPD (chronic obstructive pulmonary disease) ([place])    "dx'd but I don't take RX for it" (05/24/2015)   Coronary artery disease    Cystic kidney disease    GERD (gastroesophageal reflux disease)    History of hiatal hernia    MI (myocardial infarction) ([place]) 09/2010   Mixed hyperlipidemia    Skin cancer    "top of my head only" (05/24/2015)   Sleep apnea    Syncope and collapse ~ 2013; 05/24/2015    Medications:  (Not in a hospital admission)  Scheduled:  Infusions:  PRN:   Assessment: 48 yom with a history of Arthritis, chest pain, Chronic bronchitis, colonic polyps, COPD, CAD, Cystic kidney disease, GERD, MI (09/2010), HLD, Skin cancer, Sleep apnea, syncope. Patient is presenting with leg pain. Patient had DVT study scheduled for later today. Heparin per pharmacy consult placed for pulmonary embolus and DVT.  Patient is not on anticoagulation prior to  arrival.  Hgb 12.5; plt 93  Goal of Therapy:  Heparin level 0.3-0.7 units/ml Monitor platelets by anticoagulation protocol: Yes   Plan:  Give 4500 units bolus x 1 Start heparin infusion at 1400 units/hr Check anti-Xa level at 2200 and daily while on heparin Continue to monitor H&H and platelets  Lorelei Pont, PharmD, BCPS 02/08/2022 3:40 PM ED Clinical Pharmacist -  [phone]

## 2022-02-08 NOTE — Telephone Encounter (Signed)
FYI Spoke to [facility] [employer] and he stated that pt had a "spell". He is in the ED now getting assistance.

## 2022-02-08 NOTE — H&P (Signed)
History and Physical    Patient: Michael Johnston WIO:[id number] DOB: 22-Mar-1932 DOA: 02/08/2022 DOS: the patient was seen and examined on 02/08/2022 PCP: Dorothyann Peng, NP  Patient coming from: Home  Chief Complaint:  Chief Complaint  Patient presents with   Abnormal Lab   Leg Pain   HPI: Michael Johnston Is a 88 y.o. male with medical history significant of COPD,  hyperlipidemia, CAD, MI s/p CABG in 2022, was right leg pain and swelling and was seen by PCP, and underwent blood work revealing elevated D dimer. He presented to ED for evaluation of DVT and PE. Pt denies fever, chills, sob, chest pain. Cough, nausea, vomiting and abdominal pain . Pt denies any syncope. While in the ED, he underwent  CT angio of the chest showing Acute subsegmental pulmonary emboli in the right upper and lower lobes. Clot burden is small. Venous duplex of the lower extremities show Findings consistent with age indeterminate deep vein thrombosis involving the right posterior tibial veins, and right peroneal veins.   Findings consistent with acute deep vein thrombosis involving the left  popliteal vein.  Findings consistent with age indeterminate deep vein thrombosis involving the left posterior tibial veins, and left peroneal veins. He was referred to [facility] for IV anticoagulation.  While in the ED, pt became unresponsive for a few seconds, unable to speak , with some chills.  He subsequently underwent CT head without contrast, which was negative.   Review of Systems: As mentioned in the history of present illness. All other systems reviewed and are negative. Past Medical History:  Diagnosis Date   Arthritis    "right shoulder" (05/24/2015)   CHEST PAIN    Chronic bronchitis ([place])    "get it ~ q yr" (05/24/2015)   COLONIC POLYPS, HX OF    COPD (chronic obstructive pulmonary disease) ([place])    "dx'd but I don't take RX for it" (05/24/2015)   Coronary artery disease    Cystic kidney disease    GERD  (gastroesophageal reflux disease)    History of hiatal hernia    MI (myocardial infarction) ([place]) 09/2010   Mixed hyperlipidemia    Skin cancer    "top of my head only" (05/24/2015)   Sleep apnea    Syncope and collapse ~ 2013; 05/24/2015   Past Surgical History:  Procedure Laterality Date   CARDIAC CATHETERIZATION  09/2010   CHOLECYSTECTOMY OPEN  1998   COLECTOMY  1998   "partial"   CORONARY ANGIOPLASTY     CORONARY ARTERY BYPASS GRAFT  09/2010   Median sternotomy for coronary artery bypass grafting x3  (left internal mammary artery to distal left anterior  descending  coronary artery, saphenous vein graft to first diagonal branch,  saphenous vein graft to first  obtuse marginal branch, endoscopic  saphenous vein harvest from right thigh). SURGEON:  Valentina Gu.  Roxy Manns, MD  ASSISTANT:  John Giovanni, PA-C  ANESTHESIA:  Glynda Jaeger, MD    LEFT HEART CATH AND CORS/GRAFTS ANGIOGRAPHY N/A 08/22/2017   Procedure: LEFT HEART CATH AND CORS/GRAFTS ANGIOGRAPHY;  Surgeon: Martinique, Peter M, MD;  Location: [facility];  Service: Cardiovascular;  Laterality: N/A;   SKIN CANCER EXCISION     "top of my head"   Social History:  reports that he quit smoking about 70 years ago. His smoking use included cigarettes. He has a 4.50 pack-year smoking history. He has never used smokeless tobacco. He reports that he does not currently use alcohol. He  reports that he does not use drugs.  Allergies  Allergen Reactions   Levaquin [Levofloxacin In D5w] Swelling and Other (See Comments)    Eyes swollen   Other Anaphylaxis and Other (See Comments)    Bee sting   Metformin And Related Nausea Only    Family History  Problem Relation Age of Onset   Lung cancer Sister    Heart attack Father     Prior to Admission medications   Medication Sig Start Date End Date Taking? Authorizing Provider  aspirin 81 MG tablet Take 81 mg by mouth daily.    [provider]  atorvastatin (LIPITOR) 20 MG tablet  TAKE 1 TABLET BY MOUTH EVERY DAY 07/17/21   Nafziger, Tommi Rumps, NP  colchicine 0.6 MG tablet Take 1 tablet (0.6 mg total) by mouth 2 (two) times daily. 12/17/21   Nafziger, Tommi Rumps, NP  cyanocobalamin (,VITAMIN B-12,) 1000 MCG/ML injection INJECT 1ML INTRAMUSCULARLY EVERY OTHER WEEK 10/03/21   Nafziger, Tommi Rumps, NP  diclofenac Sodium (VOLTAREN) 1 % GEL Apply 2 g topically 4 (four) times daily as needed. 09/15/20   Long, Wonda Olds, MD  EPINEPHrine 0.3 mg/0.3 mL IJ SOAJ injection  03/06/18   [provider]  ketoconazole (NIZORAL) 2 % cream SMARTSIG:Topical 1-2 Times Daily PRN 01/24/22   [provider]  ketoconazole (NIZORAL) 2 % shampoo USE TWICE WEEKLY 05/18/21   Nafziger, Tommi Rumps, NP  metFORMIN (GLUCOPHAGE) 500 MG tablet Take 1 tablet (500 mg total) by mouth daily with breakfast. 07/17/21   Nafziger, Tommi Rumps, NP  metoprolol tartrate (LOPRESSOR) 25 MG tablet Take 1 tablet (25 mg total) by mouth 2 (two) times daily. 07/17/21   Nafziger, Tommi Rumps, NP  nitroGLYCERIN (NITROSTAT) 0.4 MG SL tablet Place 1 tablet (0.4 mg total) under the tongue every 5 (five) minutes as needed for chest pain (3 doses max). 01/31/16   Josue Hector, MD  SYRINGE-NEEDLE, DISP, 3 ML (B-D 3CC LUER-LOK SYR 25GX1") 25G X 1" 3 ML MISC USE AS DIRECTED TO INJECT B12 02/07/22   Dorothyann Peng, NP    Physical Exam: Vitals:   02/08/22 1615 02/08/22 1630 02/08/22 1645 02/08/22 1700  BP: (!) 161/82 (!) 167/73 (!) 158/77 (!) 159/74  Pulse: (!) 54 (!) 54 (!) 56 (!) 55  Resp: '18 13 15 14  '$ Temp:      TempSrc:      SpO2: 99% 99% 98% 99%  Weight:      Height:       General exam: Appears calm and comfortable  Respiratory system: Clear to auscultation. Respiratory effort normal. Cardiovascular system: S1 & S2 heard, RRR. No JVD,  No pedal edema. Gastrointestinal system: Abdomen is nondistended, soft and nontender. No organomegaly or masses felt. Normal bowel sounds heard. Central nervous system: Alert and oriented. No focal neurological  deficits. Extremities: Symmetric 5 x 5 power. Skin: No rashes, lesions or ulcers Psychiatry: Judgement and insight appear normal. Mood & affect appropriate.   Data Reviewed: Results for orders placed or performed during the hospital encounter of 02/08/22 (from the past 24 hour(s))  Brain natriuretic peptide     Status: None   Collection Time: 02/08/22 12:48 PM  Result Value Ref Range   B Natriuretic Peptide 62.6 0.0 - 100.0 pg/mL  Basic metabolic panel     Status: Abnormal   Collection Time: 02/08/22 12:55 PM  Result Value Ref Range   Sodium 138 135 - 145 mmol/L   Potassium 4.0 3.5 - 5.1 mmol/L   Chloride 104 98 - 111  mmol/L   CO2 28 22 - 32 mmol/L   Glucose, Bld 144 (H) 70 - 99 mg/dL   BUN 21 8 - 23 mg/dL   Creatinine, Ser 0.77 0.61 - 1.24 mg/dL   Calcium 9.2 8.9 - 10.3 mg/dL   GFR, Estimated >60 >60 mL/min   Anion gap 6 5 - 15  CBC with Differential     Status: Abnormal   Collection Time: 02/08/22 12:55 PM  Result Value Ref Range   WBC 5.2 4.0 - 10.5 K/uL   RBC 3.82 (L) 4.22 - 5.81 MIL/uL   Hemoglobin 12.5 (L) 13.0 - 17.0 g/dL   HCT 38.0 (L) 39.0 - 52.0 %   MCV 99.5 80.0 - 100.0 fL   MCH 32.7 26.0 - 34.0 pg   MCHC 32.9 30.0 - 36.0 g/dL   RDW 13.0 11.5 - 15.5 %   Platelets 93 (L) 150 - 400 K/uL   nRBC 0.0 0.0 - 0.2 %   Neutrophils Relative % 52 %   Neutro Abs 2.7 1.7 - 7.7 K/uL   Lymphocytes Relative 36 %   Lymphs Abs 1.8 0.7 - 4.0 K/uL   Monocytes Relative 8 %   Monocytes Absolute 0.4 0.1 - 1.0 K/uL   Eosinophils Relative 3 %   Eosinophils Absolute 0.2 0.0 - 0.5 K/uL   Basophils Relative 1 %   Basophils Absolute 0.1 0.0 - 0.1 K/uL   Immature Granulocytes 0 %   Abs Immature Granulocytes 0.02 0.00 - 0.07 K/uL  Troponin I (High Sensitivity)     Status: None   Collection Time: 02/08/22  3:38 PM  Result Value Ref Range   Troponin I (High Sensitivity) 7 <18 ng/L  Magnesium     Status: None   Collection Time: 02/08/22  3:38 PM  Result Value Ref Range   Magnesium 2.1  1.7 - 2.4 mg/dL  Hepatic function panel     Status: Abnormal   Collection Time: 02/08/22  3:38 PM  Result Value Ref Range   Total Protein 6.3 (L) 6.5 - 8.1 g/dL   Albumin 3.5 3.5 - 5.0 g/dL   AST 22 15 - 41 U/L   ALT 26 0 - 44 U/L   Alkaline Phosphatase 60 38 - 126 U/L   Total Bilirubin 0.4 0.3 - 1.2 mg/dL   Bilirubin, Direct 0.2 0.0 - 0.2 mg/dL   Indirect Bilirubin 0.2 (L) 0.3 - 0.9 mg/dL  Protime-INR     Status: None   Collection Time: 02/08/22  3:38 PM  Result Value Ref Range   Prothrombin Time 13.9 11.4 - 15.2 seconds   INR 1.1 0.8 - 1.2     Assessment and Plan: Bilateral DVT and PE: Started him on IV heparin.  Get echocardiogram for evaluation of right heart strain.  If there is no right heart strain, transition to oral anti coagulation on discharge.    CAD s/p CABG:  - No chest pain.  - resume aspirin.     An episode of unresponsiveness while in the bed.  Lasting a few seconds.  He is alert and oriented, and no focal deficits.  Initial CT head without contrast.  MRI brain without contrast ordered.  EEG ordered for evaluation of seizures.     Mild anemia and thrombocytopenia:  Platelet count around 93,000.  No evidence of bleeding.   Elevated BP parameters:  Prn hydralazine ordered.    Type 2 DM, non insulin dependent. On metformin at home.  Hold metformin. While in patient.  Advance Care Planning:   Code Status: Prior full code.   Consults: none.  Family Communication: family at bedside.   Severity of Illness: The appropriate patient status for this patient is INPATIENT. Inpatient status is judged to be reasonable and necessary in order to provide the required intensity of service to ensure the patient's safety. The patient's presenting symptoms, physical exam findings, and initial radiographic and laboratory data in the context of their chronic comorbidities is felt to place them at high risk for further clinical deterioration. Furthermore, it  is not anticipated that the patient will be medically stable for discharge from the hospital within 2 midnights of admission.   * I certify that at the point of admission it is my clinical judgment that the patient will require inpatient hospital care spanning beyond 2 midnights from the point of admission due to high intensity of service, high risk for further deterioration and high frequency of surveillance required.*  Author: Hosie Poisson, MD 02/08/2022 5:42 PM  For on call review [email or web address].

## 2022-02-08 NOTE — Progress Notes (Signed)
EEG complete - results pending 

## 2022-02-08 NOTE — Progress Notes (Signed)
Bilateral lower ext venous  has been completed. Refer to [facility] under chart review to view preliminary results. Results give to [place].  02/08/2022  2:05 PM Avryl Roehm, Bonnye Fava

## 2022-02-08 NOTE — Telephone Encounter (Signed)
Concerned regarding D Dimer results. Would like a call

## 2022-02-09 ENCOUNTER — Inpatient Hospital Stay (HOSPITAL_COMMUNITY): Payer: Medicare Other

## 2022-02-09 DIAGNOSIS — R404 Transient alteration of awareness: Secondary | ICD-10-CM

## 2022-02-09 DIAGNOSIS — I2693 Single subsegmental pulmonary embolism without acute cor pulmonale: Secondary | ICD-10-CM

## 2022-02-09 LAB — ECHOCARDIOGRAM COMPLETE
Area-P 1/2: 2.37 cm2
Height: 71 in
S' Lateral: 3.5 cm
Weight: 2704 oz

## 2022-02-09 LAB — CBC WITH DIFFERENTIAL/PLATELET
Abs Immature Granulocytes: 0.01 10*3/uL (ref 0.00–0.07)
Basophils Absolute: 0.1 10*3/uL (ref 0.0–0.1)
Basophils Relative: 1 %
Eosinophils Absolute: 0.2 10*3/uL (ref 0.0–0.5)
Eosinophils Relative: 4 %
HCT: 35.6 % — ABNORMAL LOW (ref 39.0–52.0)
Hemoglobin: 12.1 g/dL — ABNORMAL LOW (ref 13.0–17.0)
Immature Granulocytes: 0 %
Lymphocytes Relative: 36 %
Lymphs Abs: 1.6 10*3/uL (ref 0.7–4.0)
MCH: 32.8 pg (ref 26.0–34.0)
MCHC: 34 g/dL (ref 30.0–36.0)
MCV: 96.5 fL (ref 80.0–100.0)
Monocytes Absolute: 0.4 10*3/uL (ref 0.1–1.0)
Monocytes Relative: 9 %
Neutro Abs: 2.2 10*3/uL (ref 1.7–7.7)
Neutrophils Relative %: 50 %
Platelets: 85 10*3/uL — ABNORMAL LOW (ref 150–400)
RBC: 3.69 MIL/uL — ABNORMAL LOW (ref 4.22–5.81)
RDW: 13 % (ref 11.5–15.5)
WBC: 4.4 10*3/uL (ref 4.0–10.5)
nRBC: 0 % (ref 0.0–0.2)

## 2022-02-09 LAB — BASIC METABOLIC PANEL
Anion gap: 6 (ref 5–15)
BUN: 14 mg/dL (ref 8–23)
CO2: 26 mmol/L (ref 22–32)
Calcium: 8.8 mg/dL — ABNORMAL LOW (ref 8.9–10.3)
Chloride: 107 mmol/L (ref 98–111)
Creatinine, Ser: 0.77 mg/dL (ref 0.61–1.24)
GFR, Estimated: >60 mL/min (ref >60)
Glucose, Bld: 108 mg/dL — ABNORMAL HIGH (ref 70–99)
Potassium: 4 mmol/L (ref 3.5–5.1)
Sodium: 139 mmol/L (ref 135–145)

## 2022-02-09 LAB — HEPARIN LEVEL (UNFRACTIONATED)
Heparin Unfractionated: 0.87 IU/mL — ABNORMAL HIGH (ref 0.30–0.70)
Heparin Unfractionated: 0.61 IU/mL (ref 0.30–0.70)

## 2022-02-09 NOTE — Procedures (Signed)
Patient Name: Michael Johnston  MRN: [id number]  Epilepsy Attending: Lora Havens  Referring Physician/Provider: Hosie Poisson, MD Date: 02/08/2022 Duration: 23.36 mins  Patient history: 88yo M with an episode of unresponsiveness. EEG to evaluate for seizure  Level of alertness: Awake  AEDs during EEG study: None  Technical aspects: This EEG study was done with scalp electrodes positioned according to the 10-20 International system of electrode placement. Electrical activity was acquired at a sampling rate of '500Hz'$  and reviewed with a high frequency filter of '70Hz'$  and a low frequency filter of '1Hz'$ . EEG data were recorded continuously and digitally stored.   Description: The posterior dominant rhythm consists of 9 Hz activity of moderate voltage (25-35 uV) seen predominantly in posterior head regions, symmetric and reactive to eye opening and eye closing.  Hyperventilation and photic stimulation were not performed.     IMPRESSION: This study is within normal limits. No seizures or epileptiform discharges were seen throughout the recording.  Marrisa Kimber Barbra Sarks

## 2022-02-09 NOTE — Progress Notes (Signed)
  Echocardiogram 2D Echocardiogram has been performed.  Michael Johnston 02/09/2022, 2:18 PM

## 2022-02-09 NOTE — Progress Notes (Signed)
ANTICOAGULATION CONSULT NOTE Pharmacy Consult for Heparin Indication: pulmonary embolus and DVT  Allergies  Allergen Reactions   Levaquin [Levofloxacin In D5w] Swelling and Other (See Comments)    Eyes swollen   Other Anaphylaxis and Other (See Comments)    Bee sting   Metformin And Related Nausea Only    Patient Measurements: Height: '5\' 11"'$  (180.3 cm) Weight: 76.7 kg (169 lb) IBW/kg (Calculated) : 75.3 Heparin Dosing Weight: 76.7 kg  Vital Signs: Temp: 97.5 F (36.4 C) (06/03 1245) Temp Source: Oral (06/03 1245) BP: 130/63 (06/03 0850) Pulse Rate: 64 (06/03 1245)  Labs: Recent Labs    02/07/22 1141 02/08/22 1255 02/08/22 1538 02/08/22 1853 02/08/22 2242 02/09/22 0717 02/09/22 1633  HGB 12.6* 12.5*  --   --   --  12.1*  --   HCT 37.8* 38.0*  --   --   --  35.6*  --   PLT 97.0* 93*  --   --   --  85*  --   LABPROT  --   --  13.9  --   --   --   --   INR  --   --  1.1  --   --   --   --   HEPARINUNFRC  --   --   --   --  0.72* 0.87* 0.61  CREATININE 0.84 0.77  --   --   --  0.77  --   TROPONINIHS  --   --  7 9  --   --   --      Estimated Creatinine Clearance: 69.3 mL/min (by C-G formula based on SCr of 0.77 mg/dL).  Assessment: 65 yom with a history of Arthritis, chest pain, Chronic bronchitis, colonic polyps, COPD, CAD, Cystic kidney disease, GERD, MI (09/2010), HLD, Skin cancer, Sleep apnea, syncope. Patient is presenting with leg pain.  Heparin per pharmacy consult placed for pulmonary embolus and DVT.  Heparin level elevated this am, infusion rate reduced appropriately. Recheck heparin level is therapeutic and within goal range at 0.61.  Goal of Therapy:  Heparin level 0.3-0.7 units/ml Monitor platelets by anticoagulation protocol: Yes   Plan:  Continue heparin 1200 units/h Recheck heparin level with am labs  Arrie Senate, PharmD, BCPS, [facility] Clinical Pharmacist Please check AMION for all [facility] Pharmacy numbers 02/09/2022

## 2022-02-09 NOTE — Progress Notes (Signed)
[facility]                                                                               Michael Johnston, is a 88 y.o. male, DOB - [birth year], XLK:[id number] Admit date - 02/08/2022    Outpatient Primary MD for the patient is Nafziger, Tommi Rumps, NP  LOS - 1  days    Brief summary   Michael Johnston Is a 88 y.o. male with medical history significant of COPD,  hyperlipidemia, CAD, MI s/p CABG in 2022, was right leg pain and swelling and was seen by PCP, and underwent blood work revealing elevated D dimer. He presented to ED for evaluation of DVT and PE. Imaging showed PE and bilateral age indeterminate DVT.  Echocardiogram ordered.    Assessment & Plan    Assessment and Plan:   PE and Bilateral DVT:  Started on IV heparin, transition to oral eliquis , when levels become therapeutic and after echocardiogram.     An episode of unresponsiveness in ED:  - possibly from dye injection.  - EEG is negative for seizures - MRI is unremarkable.   COPD: No wheezing.    H/o CAD s/p CABG  No chest pain or sob.   Estimated body mass index is 23.57 kg/m as calculated from the following:   Height as of this encounter: '5\' 11"'$  (1.803 m).   Weight as of this encounter: 76.7 kg.  Code Status: full code.  DVT Prophylaxis:  heparin.    Level of Care: Level of care: Telemetry Medical Family Communication: Updated patient's   Disposition Plan:     Remains inpatient appropriate:  IV heparin.   Procedures:  Eeg MRI brain.   Consultants:   None.   Antimicrobials:   Anti-infectives (From admission, onward)    None        Medications  Scheduled Meds:  aspirin EC  81 mg Oral Daily   Continuous Infusions:  sodium chloride 50 mL/hr at 02/09/22 0100   heparin 1,200 Units/hr (02/09/22 0850)   PRN Meds:.hydrALAZINE    Subjective:   Michael Johnston was seen and examined today.  No new complaints.  Objective:   Vitals:   02/09/22 0500 02/09/22 0732  02/09/22 0850 02/09/22 1245  BP: 132/71 137/69 130/63   Pulse: 61 (!) 58 60 64  Resp: '14 15  15  '$ Temp: 98 F (36.7 C) 97.7 F (36.5 C)  (!) 97.5 F (36.4 C)  TempSrc: Oral Oral  Oral  SpO2: 99% 94% 94% 98%  Weight:      Height:        Intake/Output Summary (Last 24 hours) at 02/09/2022 1826 Last data filed at 02/09/2022 0933 Gross per 24 hour  Intake 729.38 ml  Output 2150 ml  Net -1420.62 ml   Filed Weights   02/08/22 1241  Weight: 76.7 kg     Exam General: Alert and oriented x 3, NAD Cardiovascular: S1 S2 auscultated, no murmurs, RRR Respiratory: Clear to auscultation bilaterally, no wheezing, rales or rhonchi Gastrointestinal: Soft, nontender, nondistended, + bowel sounds Ext: no pedal edema bilaterally Neuro: AAOx3, Cr N's II- XII. Strength 5/5 upper and lower extremities bilaterally Skin: No  rashes Psych: Normal affect and demeanor, alert and oriented x3    Data Reviewed:  I have personally reviewed following labs and imaging studies   CBC Lab Results  Component Value Date   WBC 4.4 02/09/2022   RBC 3.69 (L) 02/09/2022   HGB 12.1 (L) 02/09/2022   HCT 35.6 (L) 02/09/2022   MCV 96.5 02/09/2022   MCH 32.8 02/09/2022   PLT 85 (L) 02/09/2022   MCHC 34.0 02/09/2022   RDW 13.0 02/09/2022   LYMPHSABS 1.6 02/09/2022   MONOABS 0.4 02/09/2022   EOSABS 0.2 02/09/2022   BASOSABS 0.1 02/58/5277     Last metabolic panel Lab Results  Component Value Date   NA 139 02/09/2022   K 4.0 02/09/2022   CL 107 02/09/2022   CO2 26 02/09/2022   BUN 14 02/09/2022   CREATININE 0.77 02/09/2022   GLUCOSE 108 (H) 02/09/2022   GFRNONAA >60 02/09/2022   GFRAA 95 07/14/2020   CALCIUM 8.8 (L) 02/09/2022   PHOS 4.1 05/15/2012   PROT 6.3 (L) 02/08/2022   ALBUMIN 3.5 02/08/2022   BILITOT 0.4 02/08/2022   ALKPHOS 60 02/08/2022   AST 22 02/08/2022   ALT 26 02/08/2022   ANIONGAP 6 02/09/2022    CBG (last 3)  Recent Labs    02/08/22 2214  GLUCAP 202*      Coagulation  Profile: Recent Labs  Lab 02/08/22 1538  INR 1.1     Radiology Studies: CT Head Wo Contrast  Result Date: 02/08/2022 CLINICAL DATA:  Stroke suspected EXAM: CT HEAD WITHOUT CONTRAST TECHNIQUE: Contiguous axial images were obtained from the base of the skull through the vertex without intravenous contrast. RADIATION DOSE REDUCTION: This exam was performed according to the departmental dose-optimization program which includes automated exposure control, adjustment of the mA and/or kV according to patient size and/or use of iterative reconstruction technique. COMPARISON:  Prior CT scan of the head 05/24/2015 FINDINGS: Brain: No evidence of acute infarction, hemorrhage, hydrocephalus, extra-axial collection or mass lesion/mass effect. Periventricular white matter hypoattenuation most consistent with chronic microvascular ischemic white matter disease. Vascular: No hyperdense vessel or unexpected calcification. Skull: Normal. Negative for fracture or focal lesion. Sinuses/Orbits: No acute finding. Other: None. IMPRESSION: 1. No acute intracranial abnormality. 2. Chronic microvascular ischemic white matter disease. Electronically Signed   By: Jacqulynn Cadet M.D.   On: 02/08/2022 15:56   CT Angio Chest PE W and/or Wo Contrast  Result Date: 02/08/2022 CLINICAL DATA:  Bilateral leg pain and swelling with elevated D-dimer. Denies chest pain or shortness of breath. EXAM: CT ANGIOGRAPHY CHEST WITH CONTRAST TECHNIQUE: Multidetector CT imaging of the chest was performed using the standard protocol during bolus administration of intravenous contrast. Multiplanar CT image reconstructions and MIPs were obtained to evaluate the vascular anatomy. RADIATION DOSE REDUCTION: This exam was performed according to the departmental dose-optimization program which includes automated exposure control, adjustment of the mA and/or kV according to patient size and/or use of iterative reconstruction technique. CONTRAST:  73m  OMNIPAQUE IOHEXOL 350 MG/ML SOLN COMPARISON:  CT chest dated April 05, 2008. FINDINGS: Cardiovascular: Satisfactory opacification of the pulmonary arteries to the segmental level. Acute subsegmental pulmonary emboli in the anterior right upper lobe (series 5, image 66), posterior right lower lobe (series 5, image 123). Clot burden is small. Normal heart size status post CABG. No pericardial effusion. No thoracic aortic aneurysm. Coronary, aortic arch, and branch vessel atherosclerotic vascular disease. Mediastinum/Nodes: Prominent reactive subcentimeter mediastinal lymph nodes are unchanged since 2009. No enlarged mediastinal, hilar,  or axillary lymph nodes. Thyroid gland, trachea, and esophagus demonstrate no significant findings. Lungs/Pleura: Two 5 mm nodules in the right lower lobe are unchanged since 2009, benign. No follow-up imaging is recommended. No focal consolidation, pleural effusion, or pneumothorax. Upper Abdomen: No acute abnormality. Bilateral renal simple cysts, measuring up to 11.4 cm on the left. No follow-up imaging is recommended. Musculoskeletal: No chest wall abnormality. No acute or significant osseous findings. Review of the MIP images confirms the above findings. IMPRESSION: 1. Acute subsegmental pulmonary emboli in the right upper and lower lobes. Clot burden is small. 2. Aortic Atherosclerosis (ICD10-I70.0). Electronically Signed   By: Titus Dubin M.D.   On: 02/08/2022 15:41   MR BRAIN WO CONTRAST  Result Date: 02/09/2022 CLINICAL DATA:  Initial evaluation for mental status change. EXAM: MRI HEAD WITHOUT CONTRAST TECHNIQUE: Multiplanar, multiecho pulse sequences of the brain and surrounding structures were obtained without intravenous contrast. COMPARISON:  CT from 02/08/2022. FINDINGS: Brain: Cerebral volume within normal limits for age. Scattered patchy T2/FLAIR hyperintensity involving the periventricular and deep white matter both cerebral hemispheres, most likely related  chronic microvascular ischemic disease, moderate in nature. Mild patchy involvement of the pons and cerebellum noted as well. No evidence for acute or subacute infarct. Gray-white matter differentiation maintained. No areas of chronic cortical infarction. No acute or chronic intracranial blood products. No mass lesion, mass effect or midline shift. No hydrocephalus or extra-axial fluid collection. Pituitary gland suprasellar region normal. Vascular: Major intracranial vascular flow voids maintained. Skull and upper cervical spine: Cranial junction within normal limits. Bone marrow signal intensity normal. Moderate to advanced spondylosis noted at C3-4 without high-grade spinal stenosis. No scalp soft tissue abnormality. Sinuses/Orbits: Prior bilateral ocular lens replacement. Paranasal sinuses are largely clear. Trace left mastoid effusion, of doubtful significance. 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 IMPRESSION: 1. No acute intracranial abnormality. 2. Moderate chronic microvascular ischemic disease. Electronically Signed   By: Jeannine Boga M.D.   On: 02/09/2022 01:58   EEG adult  Result Date: 02/09/2022 Lora Havens, MD     02/09/2022  6:06 AM Patient Name: Michael Johnston MRN: [id number] Epilepsy Attending: Lora Havens Referring Physician/Provider: Hosie Poisson, MD Date: 02/08/2022 Duration: 23.36 mins Patient history: 88yo M with an episode of unresponsiveness. EEG to evaluate for seizure Level of alertness: Awake AEDs during EEG study: None Technical aspects: This EEG study was done with scalp electrodes positioned according to the 10-20 International system of electrode placement. Electrical activity was acquired at a sampling rate of '500Hz'$  and reviewed with a high frequency filter of '70Hz'$  and a low frequency filter of '1Hz'$ . EEG data were recorded continuously and digitally stored. Description: The posterior dominant rhythm consists of 9 Hz activity of moderate voltage  (25-35 uV) seen predominantly in posterior head regions, symmetric and reactive to eye opening and eye closing. Hyperventilation and photic stimulation were not performed.   IMPRESSION: This study is within normal limits. No seizures or epileptiform discharges were seen throughout the recording. Lora Havens   ECHOCARDIOGRAM COMPLETE  Result Date: 02/09/2022    ECHOCARDIOGRAM REPORT   Patient Name:   Michael Johnston Date of Exam: 02/09/2022 Medical Rec #:  [id number]         Height:       71.0 in Accession #:    [id number]        Weight:       169.0 lb Date of Birth:  1931/11/17  BSA:          1.963 m Patient Age:    32 years          BP:           130/63 mmHg Patient Gender: M                 HR:           63 bpm. Exam Location:  Inpatient Procedure: 2D Echo Indications:    pulmonary embolus  History:        Patient has prior history of Echocardiogram examinations, most                 recent 05/26/2015. CAD, Prior CABG; Risk Factors:Dyslipidemia,                 Diabetes and Sleep Apnea.  Sonographer:    Johny Chess RDCS Referring Phys: [place]  1. Left ventricular ejection fraction, by estimation, is 60 to 65%. The left ventricle has normal function. The left ventricle has no regional wall motion abnormalities. There is mild left ventricular hypertrophy. Left ventricular diastolic parameters were normal.  2. Right ventricular systolic function is mildly reduced. The right ventricular size is normal. Tricuspid regurgitation signal is inadequate for assessing PA pressure.  3. The mitral valve is degenerative. No evidence of mitral valve regurgitation. No evidence of mitral stenosis. Moderate mitral annular calcification.  4. The aortic valve is tricuspid. Aortic valve regurgitation is not visualized. Aortic valve sclerosis/calcification is present, without any evidence of aortic stenosis.  5. The inferior vena cava is normal in size with greater than 50% respiratory  variability, suggesting right atrial pressure of 3 mmHg. FINDINGS  Left Ventricle: Left ventricular ejection fraction, by estimation, is 60 to 65%. The left ventricle has normal function. The left ventricle has no regional wall motion abnormalities. The left ventricular internal cavity size was normal in size. There is  mild left ventricular hypertrophy. Left ventricular diastolic parameters were normal. Right Ventricle: The right ventricular size is normal. No increase in right ventricular wall thickness. Right ventricular systolic function is mildly reduced. Tricuspid regurgitation signal is inadequate for assessing PA pressure. Left Atrium: Left atrial size was normal in size. Right Atrium: Right atrial size was normal in size. Pericardium: There is no evidence of pericardial effusion. Mitral Valve: The mitral valve is degenerative in appearance. Moderate mitral annular calcification. No evidence of mitral valve regurgitation. No evidence of mitral valve stenosis. Tricuspid Valve: The tricuspid valve is normal in structure. Tricuspid valve regurgitation is trivial. Aortic Valve: The aortic valve is tricuspid. Aortic valve regurgitation is not visualized. Aortic valve sclerosis/calcification is present, without any evidence of aortic stenosis. Pulmonic Valve: The pulmonic valve was not well visualized. Pulmonic valve regurgitation is trivial. Aorta: The aortic root and ascending aorta are structurally normal, with no evidence of dilitation. Venous: The inferior vena cava is normal in size with greater than 50% respiratory variability, suggesting right atrial pressure of 3 mmHg. IAS/Shunts: No atrial level shunt detected by color flow Doppler.  LEFT VENTRICLE PLAX 2D LVIDd:         4.80 cm   Diastology LVIDs:         3.50 cm   LV e' medial:    7.20 cm/s LV PW:         1.00 cm   LV E/e' medial:  8.2 LV IVS:        0.90 cm   LV e'  lateral:   11.70 cm/s LVOT diam:     2.10 cm   LV E/e' lateral: 5.0 LV SV:         75  LV SV Index:   38 LVOT Area:     3.46 cm  RIGHT VENTRICLE            IVC RV S prime:     9.38 cm/s  IVC diam: 1.40 cm TAPSE (M-mode): 1.6 cm LEFT ATRIUM             Index        RIGHT ATRIUM           Index LA diam:        3.30 cm 1.68 cm/m   RA Area:     13.00 cm LA Vol (A2C):   34.7 ml 17.68 ml/m  RA Volume:   31.60 ml  16.10 ml/m LA Vol (A4C):   32.6 ml 16.61 ml/m LA Biplane Vol: 34.3 ml 17.47 ml/m  AORTIC VALVE LVOT Vmax:   105.00 cm/s LVOT Vmean:  66.600 cm/s LVOT VTI:    0.217 m  AORTA Ao Root diam: 3.20 cm Ao Asc diam:  3.30 cm MITRAL VALVE MV Area (PHT): 2.37 cm    SHUNTS MV Decel Time: 320 msec    Systemic VTI:  0.22 m MV E velocity: 58.70 cm/s  Systemic Diam: 2.10 cm MV A velocity: 80.20 cm/s MV E/A ratio:  0.73 Oswaldo Milian MD Electronically signed by Oswaldo Milian MD Signature Date/Time: 02/09/2022/2:33:59 PM    Final    VAS [place] LOWER EXTREMITY VENOUS (DVT) (7a-7p)  Result Date: 02/08/2022  Lower Venous DVT Study Patient Name:  Michael Johnston  Date of Exam:   02/08/2022 Medical Rec #: [id number]          Accession #:    [id number] Date of Birth: 14-Sep-1931         Patient Gender: M Patient Age:   36 years Exam Location:  [facility] Procedure:      VAS [place] LOWER EXTREMITY VENOUS (DVT) Referring Phys: Gertie Fey TRAN --------------------------------------------------------------------------------  Indications: Pain, and Swelling in left calf for 2 weeks.  Comparison Study: No priors. Performing Technologist: Oda Cogan RDMS, RVT  Examination Guidelines: A complete evaluation includes B-mode imaging, spectral Doppler, color Doppler, and power Doppler as needed of all accessible portions of each vessel. Bilateral testing is considered an integral part of a complete examination. Limited examinations for reoccurring indications may be performed as noted. The reflux portion of the exam is performed with the patient in reverse Trendelenburg.   +---------+---------------+---------+-----------+----------+-----------------+ RIGHT    CompressibilityPhasicitySpontaneityPropertiesThrombus Aging    +---------+---------------+---------+-----------+----------+-----------------+ CFV      Full           Yes      Yes                                    +---------+---------------+---------+-----------+----------+-----------------+ SFJ      Full                                                           +---------+---------------+---------+-----------+----------+-----------------+ FV Prox  Full                                                           +---------+---------------+---------+-----------+----------+-----------------+  FV Mid   Full                                                           +---------+---------------+---------+-----------+----------+-----------------+ FV DistalFull                                                           +---------+---------------+---------+-----------+----------+-----------------+ PFV      Full                                                           +---------+---------------+---------+-----------+----------+-----------------+ POP      Full           Yes      Yes                                    +---------+---------------+---------+-----------+----------+-----------------+ PTV      Partial                                      Age Indeterminate +---------+---------------+---------+-----------+----------+-----------------+ PERO     Partial                                      Age Indeterminate +---------+---------------+---------+-----------+----------+-----------------+   +---------+---------------+---------+-----------+----------+-----------------+ LEFT     CompressibilityPhasicitySpontaneityPropertiesThrombus Aging    +---------+---------------+---------+-----------+----------+-----------------+ CFV      Full           Yes      Yes                                     +---------+---------------+---------+-----------+----------+-----------------+ SFJ      Full                                                           +---------+---------------+---------+-----------+----------+-----------------+ FV Prox  Full                                                           +---------+---------------+---------+-----------+----------+-----------------+ FV Mid   Full                                                           +---------+---------------+---------+-----------+----------+-----------------+  FV DistalFull                                                           +---------+---------------+---------+-----------+----------+-----------------+ PFV      Full                                                           +---------+---------------+---------+-----------+----------+-----------------+ POP      None           No       No                   Acute             +---------+---------------+---------+-----------+----------+-----------------+ PTV      None                                         Age Indeterminate +---------+---------------+---------+-----------+----------+-----------------+ PERO     None                                         Age Indeterminate +---------+---------------+---------+-----------+----------+-----------------+     Summary: RIGHT: - Findings consistent with age indeterminate deep vein thrombosis involving the right posterior tibial veins, and right peroneal veins.  LEFT: - Findings consistent with acute deep vein thrombosis involving the left popliteal vein. - Findings consistent with age indeterminate deep vein thrombosis involving the left posterior tibial veins, and left peroneal veins.  *See table(s) above for measurements and observations. Electronically signed by Deitra Mayo MD on 02/08/2022 at 3:35:29 PM.    Final        Hosie Poisson M.D. [facility] 02/09/2022, 6:26 PM  Available via Epic secure chat 7am-7pm After 7 pm, please refer to night coverage provider listed on amion.

## 2022-02-09 NOTE — Progress Notes (Signed)
ANTICOAGULATION CONSULT NOTE Pharmacy Consult for Heparin Indication: pulmonary embolus and DVT  Allergies  Allergen Reactions   Levaquin [Levofloxacin In D5w] Swelling and Other (See Comments)    Eyes swollen   Other Anaphylaxis and Other (See Comments)    Bee sting   Metformin And Related Nausea Only    Patient Measurements: Height: '5\' 11"'$  (180.3 cm) Weight: 76.7 kg (169 lb) IBW/kg (Calculated) : 75.3 Heparin Dosing Weight: 76.7 kg  Vital Signs: Temp: 97.7 F (36.5 C) (06/03 0732) Temp Source: Oral (06/03 0732) BP: 137/69 (06/03 0732) Pulse Rate: 58 (06/03 0732)  Labs: Recent Labs    02/07/22 1141 02/08/22 1255 02/08/22 1538 02/08/22 1853 02/08/22 2242 02/09/22 0717  HGB 12.6* 12.5*  --   --   --  12.1*  HCT 37.8* 38.0*  --   --   --  35.6*  PLT 97.0* 93*  --   --   --  85*  LABPROT  --   --  13.9  --   --   --   INR  --   --  1.1  --   --   --   HEPARINUNFRC  --   --   --   --  0.72* 0.87*  CREATININE 0.84 0.77  --   --   --  0.77  TROPONINIHS  --   --  7 9  --   --      Estimated Creatinine Clearance: 69.3 mL/min (by C-G formula based on SCr of 0.77 mg/dL).  Assessment: 62 yom with a history of Arthritis, chest pain, Chronic bronchitis, colonic polyps, COPD, CAD, Cystic kidney disease, GERD, MI (09/2010), HLD, Skin cancer, Sleep apnea, syncope. Patient is presenting with leg pain.  Heparin per pharmacy consult placed for pulmonary embolus and DVT.  Heparin level this AM 0.87 and supratherapeutic. Hemoglobin is stable this AM. Platelets are 85 (down from 93 on admission). No signs of bleeding noted per RN.  Goal of Therapy:  Heparin level 0.3-0.7 units/ml Monitor platelets by anticoagulation protocol: Yes   Plan:  Decrease IV heparin gtt to 1200 units/hour 8h heparin level Daily heparin level, CBC Monitor signs and symptoms of bleeding F/u plans for oral anticoagulation when able  Thank you for involving pharmacy in this patient's care.  Elita Quick, PharmD PGY1 Ambulatory Care Pharmacy Resident 02/09/2022 8:45 AM  **Pharmacist phone directory can be found on [email or web address] listed under [place]**

## 2022-02-10 ENCOUNTER — Encounter: Payer: Self-pay | Admitting: Adult Health

## 2022-02-10 LAB — CBC
HCT: 34.5 % — ABNORMAL LOW (ref 39.0–52.0)
Hemoglobin: 11.5 g/dL — ABNORMAL LOW (ref 13.0–17.0)
MCH: 32.3 pg (ref 26.0–34.0)
MCHC: 33.3 g/dL (ref 30.0–36.0)
MCV: 96.9 fL (ref 80.0–100.0)
Platelets: 98 10*3/uL — ABNORMAL LOW (ref 150–400)
RBC: 3.56 MIL/uL — ABNORMAL LOW (ref 4.22–5.81)
RDW: 13 % (ref 11.5–15.5)
WBC: 4.3 10*3/uL (ref 4.0–10.5)
nRBC: 0 % (ref 0.0–0.2)

## 2022-02-10 LAB — GLUCOSE, CAPILLARY
Glucose-Capillary: 132 mg/dL — ABNORMAL HIGH (ref 70–99)
Glucose-Capillary: 118 mg/dL — ABNORMAL HIGH (ref 70–99)

## 2022-02-10 LAB — HEPARIN LEVEL (UNFRACTIONATED): Heparin Unfractionated: 0.5 IU/mL (ref 0.30–0.70)

## 2022-02-10 MED ORDER — APIXABAN 5 MG PO TABS: 5.0000 mg | ORAL_TABLET | Freq: Two times a day (BID) | ORAL | Status: DC

## 2022-02-10 MED ORDER — APIXABAN 5 MG PO TABS: 10.0000 mg | ORAL_TABLET | Freq: Two times a day (BID) | ORAL | 0 refills | Status: DC

## 2022-02-10 MED ORDER — APIXABAN 5 MG PO TABS
10.0000 mg | ORAL_TABLET | Freq: Two times a day (BID) | ORAL | Status: DC
  Administered 2022-02-10: 10 mg via ORAL
  Filled 2022-02-10: qty 2

## 2022-02-10 MED ORDER — APIXABAN 5 MG PO TABS: 5.0000 mg | ORAL_TABLET | Freq: Two times a day (BID) | ORAL | 2 refills | Status: DC

## 2022-02-10 NOTE — Progress Notes (Signed)
ANTICOAGULATION CONSULT NOTE Pharmacy Consult for Heparin Indication: pulmonary embolus and DVT  Allergies  Allergen Reactions   Levaquin [Levofloxacin In D5w] Swelling and Other (See Comments)    Eyes swollen   Other Anaphylaxis and Other (See Comments)    Bee sting   Metformin And Related Nausea Only    Patient Measurements: Height: '5\' 11"'$  (180.3 cm) Weight: 76.7 kg (169 lb) IBW/kg (Calculated) : 75.3 Heparin Dosing Weight: 76.7 kg  Vital Signs: Temp: 97.7 F (36.5 C) (06/04 0432) Temp Source: Oral (06/04 0432) BP: 141/66 (06/04 0432) Pulse Rate: 60 (06/04 0432)  Labs: Recent Labs    02/07/22 1141 02/08/22 1255 02/08/22 1538 02/08/22 1853 02/08/22 2242 02/09/22 0717 02/09/22 1633 02/10/22 0255  HGB 12.6* 12.5*  --   --   --  12.1*  --  11.5*  HCT 37.8* 38.0*  --   --   --  35.6*  --  34.5*  PLT 97.0* 93*  --   --   --  85*  --  98*  LABPROT  --   --  13.9  --   --   --   --   --   INR  --   --  1.1  --   --   --   --   --   HEPARINUNFRC  --   --   --   --    < > 0.87* 0.61 0.50  CREATININE 0.84 0.77  --   --   --  0.77  --   --   TROPONINIHS  --   --  7 9  --   --   --   --    < > = values in this interval not displayed.     Estimated Creatinine Clearance: 69.3 mL/min (by C-G formula based on SCr of 0.77 mg/dL).  Assessment: 51 yom with a history of Arthritis, chest pain, Chronic bronchitis, colonic polyps, COPD, CAD, Cystic kidney disease, GERD, MI (09/2010), HLD, Skin cancer, Sleep apnea, syncope. Patient is presenting with leg pain.  Heparin per pharmacy consult placed for pulmonary embolus and DVT.  Heparin level this AM is 0.50 and within goal at 1200 units/h. Hgb remains stable, platelets improving to 98k this AM. No signs of bleeding noted.   Goal of Therapy:  Heparin level 0.3-0.7 units/ml Monitor platelets by anticoagulation protocol: Yes   Plan:  Continue heparin 1200 units/h Daily heparin level, CBC while on heparin Monitor for signs and  symptoms of bleeding F/u plans for transition to oral anticoagulation when able  Thank you for involving pharmacy in this patient's care.  Elita Quick, PharmD PGY1 Ambulatory Care Pharmacy Resident 02/10/2022 8:49 AM  **Pharmacist phone directory can be found on [email or web address] listed under [place]**

## 2022-02-10 NOTE — Progress Notes (Signed)
ANTICOAGULATION CONSULT NOTE - Initial Consult  Pharmacy Consult for apixaban Indication: pulmonary embolus and DVT  Allergies  Allergen Reactions   Levaquin [Levofloxacin In D5w] Swelling and Other (See Comments)    Eyes swollen   Other Anaphylaxis and Other (See Comments)    Bee sting   Metformin And Related Nausea Only    Patient Measurements: Height: '5\' 11"'$  (180.3 cm) Weight: 76.7 kg (169 lb) IBW/kg (Calculated) : 75.3   Vital Signs: Temp: 97.7 F (36.5 C) (06/04 0432) Temp Source: Oral (06/04 0432) BP: 141/66 (06/04 0432) Pulse Rate: 60 (06/04 0432)  Labs: Recent Labs    02/07/22 1141 02/08/22 1255 02/08/22 1538 02/08/22 1853 02/08/22 2242 02/09/22 0717 02/09/22 1633 02/10/22 0255  HGB 12.6* 12.5*  --   --   --  12.1*  --  11.5*  HCT 37.8* 38.0*  --   --   --  35.6*  --  34.5*  PLT 97.0* 93*  --   --   --  85*  --  98*  LABPROT  --   --  13.9  --   --   --   --   --   INR  --   --  1.1  --   --   --   --   --   HEPARINUNFRC  --   --   --   --    < > 0.87* 0.61 0.50  CREATININE 0.84 0.77  --   --   --  0.77  --   --   TROPONINIHS  --   --  7 9  --   --   --   --    < > = values in this interval not displayed.    Estimated Creatinine Clearance: 69.3 mL/min (by C-G formula based on SCr of 0.77 mg/dL).   Medical History: Past Medical History:  Diagnosis Date   Arthritis    "right shoulder" (05/24/2015)   CHEST PAIN    Chronic bronchitis ([place])    "get it ~ q yr" (05/24/2015)   COLONIC POLYPS, HX OF    COPD (chronic obstructive pulmonary disease) ([place])    "dx'd but I don't take RX for it" (05/24/2015)   Coronary artery disease    Cystic kidney disease    GERD (gastroesophageal reflux disease)    History of hiatal hernia    MI (myocardial infarction) ([place]) 09/2010   Mixed hyperlipidemia    Skin cancer    "top of my head only" (05/24/2015)   Sleep apnea    Syncope and collapse ~ 2013; 05/24/2015   Assessment: 89 yom with a history of Arthritis, chest  pain, Chronic bronchitis, colonic polyps, COPD, CAD, Cystic kidney disease, GERD, MI (09/2010), HLD, Skin cancer, Sleep apnea, syncope. Patient is presenting with leg pain. Pharmacy consulted to transition heparin gtt to apixaban.  Hgb and platelets are stable, no signs of bleeding noted. Renal function within normal limits.   Plan:  Stop heparin gtt Start apixaban 10 mg BID PO x 7days Follow by apixaban 5 mg PO BID Monitor for signs and symptoms of bleeding Pharmacy to monitor peripherally  Thank you for involving pharmacy in this patient's care.  Elita Quick, PharmD PGY1 Ambulatory Care Pharmacy Resident 02/10/2022 10:46 AM  **Pharmacist phone directory can be found on [email or web address] listed under [place]**

## 2022-02-10 NOTE — Progress Notes (Signed)
Patient's oxygen level maintained 94-100% on room air, while sleeping, noted sats to drop to 72 nonsustained on room air.   Snoring was noted upon assessment.  Pt stated he was told he had sleep apnea but not bad enough to require a cpap machine some time ago.  It was around the same time he had a colonoscopy and had difficulty awakening post procedure per nurse attending.   O2 at 2L per [place] was applied for sleep hours.  Will continue to monitor closely.

## 2022-02-11 ENCOUNTER — Emergency Department (HOSPITAL_BASED_OUTPATIENT_CLINIC_OR_DEPARTMENT_OTHER)
Admission: EM | Admit: 2022-02-11 | Discharge: 2022-02-11 | Disposition: A | Discharge status: Home / Self Care | Payer: Medicare Other | Attending: Emergency Medicine | Admitting: Emergency Medicine

## 2022-02-11 ENCOUNTER — Other Ambulatory Visit: Payer: Self-pay

## 2022-02-11 DIAGNOSIS — R300 Dysuria: Secondary | ICD-10-CM | POA: Diagnosis present

## 2022-02-11 DIAGNOSIS — E119 Type 2 diabetes mellitus without complications: Secondary | ICD-10-CM | POA: Insufficient documentation

## 2022-02-11 DIAGNOSIS — Z7901 Long term (current) use of anticoagulants: Secondary | ICD-10-CM | POA: Diagnosis not present

## 2022-02-11 DIAGNOSIS — Z85828 Personal history of other malignant neoplasm of skin: Secondary | ICD-10-CM | POA: Insufficient documentation

## 2022-02-11 DIAGNOSIS — Z7984 Long term (current) use of oral hypoglycemic drugs: Secondary | ICD-10-CM | POA: Diagnosis not present

## 2022-02-11 DIAGNOSIS — N3000 Acute cystitis without hematuria: Secondary | ICD-10-CM | POA: Diagnosis not present

## 2022-02-11 DIAGNOSIS — D72829 Elevated white blood cell count, unspecified: Secondary | ICD-10-CM | POA: Insufficient documentation

## 2022-02-11 DIAGNOSIS — J449 Chronic obstructive pulmonary disease, unspecified: Secondary | ICD-10-CM | POA: Diagnosis not present

## 2022-02-11 DIAGNOSIS — I2510 Atherosclerotic heart disease of native coronary artery without angina pectoris: Secondary | ICD-10-CM | POA: Insufficient documentation

## 2022-02-11 DIAGNOSIS — Z7982 Long term (current) use of aspirin: Secondary | ICD-10-CM | POA: Insufficient documentation

## 2022-02-11 LAB — URINALYSIS, ROUTINE W REFLEX MICROSCOPIC
Bilirubin Urine: NEGATIVE
Glucose, UA: NEGATIVE mg/dL
Ketones, ur: NEGATIVE mg/dL
Nitrite: NEGATIVE
Protein, ur: >300 mg/dL — AB
RBC / HPF: >50 RBC/hpf — ABNORMAL HIGH (ref 0–5)
Specific Gravity, Urine: 1.016 (ref 1.005–1.030)
WBC, UA: >50 WBC/hpf — ABNORMAL HIGH (ref 0–5)
pH: 7 (ref 5.0–8.0)

## 2022-02-11 LAB — PATHOLOGIST SMEAR REVIEW

## 2022-02-11 MED ORDER — SODIUM CHLORIDE 0.9 % IV SOLN
1.0000 g | Freq: Once | INTRAVENOUS | Status: AC
  Administered 2022-02-11: 1 g via INTRAVENOUS
  Filled 2022-02-11: qty 10

## 2022-02-11 MED ORDER — CEPHALEXIN 500 MG PO CAPS: 500.0000 mg | ORAL_CAPSULE | Freq: Three times a day (TID) | ORAL | 0 refills | Status: DC

## 2022-02-11 MED ORDER — CEFTRIAXONE SODIUM 1 G IJ SOLR: 1.0000 g | Freq: Once | INTRAMUSCULAR | Status: DC

## 2022-02-11 NOTE — ED Notes (Signed)
Pt verbalizes understanding of discharge instructions. Opportunity for questioning and answers were provided. Pt discharged from ED to home with family.    

## 2022-02-11 NOTE — ED Notes (Signed)
Amy in the lab aware that a urine culture needs to be added.

## 2022-02-11 NOTE — Discharge Summary (Signed)
Physician Discharge Summary   Patient: Michael Johnston MRN: [id number] DOB: [birth year]  Admit date:     02/08/2022  Discharge date: 02/10/2022  Discharge Physician: Hosie Poisson   PCP: Dorothyann Peng, NP   Recommendations at discharge:  Please follow up with Hematology as recommended.  Please follow up with PCP in one week.  Please follow up with cbc and BMP in one week.   Discharge Diagnoses: Principal Problem:   Pulmonary embolus [facility])  Hospital Course: Michael Johnston Is a 88 y.o. male with medical history significant of COPD,  hyperlipidemia, CAD, MI s/p CABG in 2022, was right leg pain and swelling and was seen by PCP, and underwent blood work revealing elevated D dimer. He presented to ED for evaluation of DVT and PE. Imaging showed PE and bilateral age indeterminate DVT.  Echocardiogram ordered.    Assessment and Plan:  PE and Bilateral DVT:  Started on IV heparin, transition to oral eliquis . Recommend outpatient follow up with hematology for evaluation of hypercoagulable state.   echocardiogram does not show any right heart strain.      An episode of unresponsiveness in ED:  - possibly from dye injection.  - EEG is negative for seizures - MRI is unremarkable.    COPD: No wheezing.      H/o CAD s/p CABG  No chest pain or sob.    Estimated body mass index is 23.57 kg/m as calculated from the following:   Height as of this encounter: '5\' 11"'$  (1.803 m).   Weight as of this encounter: 76.7 kg.        Consultants: none.  Procedures performed: MRI brain EEG   Disposition: Home Diet recommendation:  Discharge Diet Orders (From admission, onward)     Start     Ordered   02/10/22 0000  Diet - low sodium heart healthy        02/10/22 1332           Regular diet DISCHARGE MEDICATION: Allergies as of 02/10/2022       Reactions   Levaquin [levofloxacin In D5w] Swelling, Other (See Comments)   Eyes swollen   Other Anaphylaxis, Other (See Comments)    Bee sting   Metformin And Related Nausea Only        Medication List     STOP taking these medications    colchicine 0.6 MG tablet       TAKE these medications    apixaban 5 MG Tabs tablet Commonly known as: ELIQUIS Take 2 tablets (10 mg total) by mouth 2 (two) times daily for 7 days.   apixaban 5 MG Tabs tablet Commonly known as: ELIQUIS Take 1 tablet (5 mg total) by mouth 2 (two) times daily. Start taking on: February 17, 2022   aspirin 81 MG tablet Take 81 mg by mouth daily.   atorvastatin 20 MG tablet Commonly known as: LIPITOR TAKE 1 TABLET BY MOUTH EVERY DAY What changed:  how much to take how to take this when to take this additional instructions   B-D 3CC LUER-LOK SYR 25GX1" 25G X 1" 3 ML Misc Generic drug: SYRINGE-NEEDLE (DISP) 3 ML USE AS DIRECTED TO INJECT B12   cyanocobalamin 1000 MCG/ML injection Commonly known as: (VITAMIN B-12) INJECT 1ML INTRAMUSCULARLY EVERY OTHER WEEK What changed: See the new instructions.   diclofenac Sodium 1 % Gel Commonly known as: Voltaren Apply 2 g topically 4 (four) times daily as needed. What changed: reasons to take this   EPINEPHrine  0.3 mg/0.3 mL Soaj injection Commonly known as: EPI-PEN   ketoconazole 2 % shampoo Commonly known as: NIZORAL USE TWICE WEEKLY What changed: See the new instructions.   metFORMIN 500 MG tablet Commonly known as: GLUCOPHAGE Take 1 tablet (500 mg total) by mouth daily with breakfast.   metoprolol tartrate 25 MG tablet Commonly known as: LOPRESSOR Take 1 tablet (25 mg total) by mouth 2 (two) times daily.   nitroGLYCERIN 0.4 MG SL tablet Commonly known as: NITROSTAT Place 1 tablet (0.4 mg total) under the tongue every 5 (five) minutes as needed for chest pain (3 doses max).        Follow-up Information     Dorothyann Peng, NP. Schedule an appointment as soon as possible for a visit in 1 week(s).   Specialty: Family Medicine Contact information: [address] [place] [address] [phone]                Discharge Exam: Danley Danker Weights   02/08/22 1241  Weight: 76.7 kg   General exam: Appears calm and comfortable  Respiratory system: Clear to auscultation. Respiratory effort normal. Cardiovascular system: S1 & S2 heard, RRR. No JVD, murmurs, rubs, gallops or clicks. No pedal edema. Gastrointestinal system: Abdomen is nondistended, soft and nontender. No organomegaly or masses felt. Normal bowel sounds heard. Central nervous system: Alert and oriented. No focal neurological deficits. Extremities: Symmetric 5 x 5 power. Skin: No rashes, lesions or ulcers Psychiatry: Judgement and insight appear normal. Mood & affect appropriate.    Condition at discharge: fair  The results of significant diagnostics from this hospitalization (including imaging, microbiology, ancillary and laboratory) are listed below for reference.   Imaging Studies: CT Head Wo Contrast  Result Date: 02/08/2022 CLINICAL DATA:  Stroke suspected EXAM: CT HEAD WITHOUT CONTRAST TECHNIQUE: Contiguous axial images were obtained from the base of the skull through the vertex without intravenous contrast. RADIATION DOSE REDUCTION: This exam was performed according to the departmental dose-optimization program which includes automated exposure control, adjustment of the mA and/or kV according to patient size and/or use of iterative reconstruction technique. COMPARISON:  Prior CT scan of the head 05/24/2015 FINDINGS: Brain: No evidence of acute infarction, hemorrhage, hydrocephalus, extra-axial collection or mass lesion/mass effect. Periventricular white matter hypoattenuation most consistent with chronic microvascular ischemic white matter disease. Vascular: No hyperdense vessel or unexpected calcification. Skull: Normal. Negative for fracture or focal lesion. Sinuses/Orbits: No acute finding. Other: None. IMPRESSION: 1. No acute intracranial abnormality. 2. Chronic microvascular  ischemic white matter disease. Electronically Signed   By: Jacqulynn Cadet M.D.   On: 02/08/2022 15:56   CT Angio Chest PE W and/or Wo Contrast  Result Date: 02/08/2022 CLINICAL DATA:  Bilateral leg pain and swelling with elevated D-dimer. Denies chest pain or shortness of breath. EXAM: CT ANGIOGRAPHY CHEST WITH CONTRAST TECHNIQUE: Multidetector CT imaging of the chest was performed using the standard protocol during bolus administration of intravenous contrast. Multiplanar CT image reconstructions and MIPs were obtained to evaluate the vascular anatomy. RADIATION DOSE REDUCTION: This exam was performed according to the departmental dose-optimization program which includes automated exposure control, adjustment of the mA and/or kV according to patient size and/or use of iterative reconstruction technique. CONTRAST:  77m OMNIPAQUE IOHEXOL 350 MG/ML SOLN COMPARISON:  CT chest dated April 05, 2008. FINDINGS: Cardiovascular: Satisfactory opacification of the pulmonary arteries to the segmental level. Acute subsegmental pulmonary emboli in the anterior right upper lobe (series 5, image 66), posterior right lower lobe (series 5, image 123).  Clot burden is small. Normal heart size status post CABG. No pericardial effusion. No thoracic aortic aneurysm. Coronary, aortic arch, and branch vessel atherosclerotic vascular disease. Mediastinum/Nodes: Prominent reactive subcentimeter mediastinal lymph nodes are unchanged since 2009. No enlarged mediastinal, hilar, or axillary lymph nodes. Thyroid gland, trachea, and esophagus demonstrate no significant findings. Lungs/Pleura: Two 5 mm nodules in the right lower lobe are unchanged since 2009, benign. No follow-up imaging is recommended. No focal consolidation, pleural effusion, or pneumothorax. Upper Abdomen: No acute abnormality. Bilateral renal simple cysts, measuring up to 11.4 cm on the left. No follow-up imaging is recommended. Musculoskeletal: No chest wall abnormality.  No acute or significant osseous findings. Review of the MIP images confirms the above findings. IMPRESSION: 1. Acute subsegmental pulmonary emboli in the right upper and lower lobes. Clot burden is small. 2. Aortic Atherosclerosis (ICD10-I70.0). Electronically Signed   By: Titus Dubin M.D.   On: 02/08/2022 15:41   MR BRAIN WO CONTRAST  Result Date: 02/09/2022 CLINICAL DATA:  Initial evaluation for mental status change. EXAM: MRI HEAD WITHOUT CONTRAST TECHNIQUE: Multiplanar, multiecho pulse sequences of the brain and surrounding structures were obtained without intravenous contrast. COMPARISON:  CT from 02/08/2022. FINDINGS: Brain: Cerebral volume within normal limits for age. Scattered patchy T2/FLAIR hyperintensity involving the periventricular and deep white matter both cerebral hemispheres, most likely related chronic microvascular ischemic disease, moderate in nature. Mild patchy involvement of the pons and cerebellum noted as well. No evidence for acute or subacute infarct. Gray-white matter differentiation maintained. No areas of chronic cortical infarction. No acute or chronic intracranial blood products. No mass lesion, mass effect or midline shift. No hydrocephalus or extra-axial fluid collection. Pituitary gland suprasellar region normal. Vascular: Major intracranial vascular flow voids maintained. Skull and upper cervical spine: Cranial junction within normal limits. Bone marrow signal intensity normal. Moderate to advanced spondylosis noted at C3-4 without high-grade spinal stenosis. No scalp soft tissue abnormality. Sinuses/Orbits: Prior bilateral ocular lens replacement. Paranasal sinuses are largely clear. Trace left mastoid effusion, of doubtful significance. 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 IMPRESSION: 1. No acute intracranial abnormality. 2. Moderate chronic microvascular ischemic disease. Electronically Signed   By: Jeannine Boga M.D.   On: 02/09/2022 01:58    EEG adult  Result Date: 02/09/2022 Lora Havens, MD     02/09/2022  6:06 AM Patient Name: TAYSHAWN PURNELL MRN: [id number] Epilepsy Attending: Lora Havens Referring Physician/Provider: Hosie Poisson, MD Date: 02/08/2022 Duration: 23.36 mins Patient history: 88yo M with an episode of unresponsiveness. EEG to evaluate for seizure Level of alertness: Awake AEDs during EEG study: None Technical aspects: This EEG study was done with scalp electrodes positioned according to the 10-20 International system of electrode placement. Electrical activity was acquired at a sampling rate of '500Hz'$  and reviewed with a high frequency filter of '70Hz'$  and a low frequency filter of '1Hz'$ . EEG data were recorded continuously and digitally stored. Description: The posterior dominant rhythm consists of 9 Hz activity of moderate voltage (25-35 uV) seen predominantly in posterior head regions, symmetric and reactive to eye opening and eye closing. Hyperventilation and photic stimulation were not performed.   IMPRESSION: This study is within normal limits. No seizures or epileptiform discharges were seen throughout the recording. Lora Havens   ECHOCARDIOGRAM COMPLETE  Result Date: 02/09/2022    ECHOCARDIOGRAM REPORT   Patient Name:   ASHANTE SNELLING Date of Exam: 02/09/2022 Medical Rec #:  [id number]         Height:  71.0 in Accession #:    [id number]        Weight:       169.0 lb Date of Birth:  1932-05-27        BSA:          1.963 m Patient Age:    60 years          BP:           130/63 mmHg Patient Gender: M                 HR:           63 bpm. Exam Location:  Inpatient Procedure: 2D Echo Indications:    pulmonary embolus  History:        Patient has prior history of Echocardiogram examinations, most                 recent 05/26/2015. CAD, Prior CABG; Risk Factors:Dyslipidemia,                 Diabetes and Sleep Apnea.  Sonographer:    Johny Chess RDCS Referring Phys: [place]  1. Left  ventricular ejection fraction, by estimation, is 60 to 65%. The left ventricle has normal function. The left ventricle has no regional wall motion abnormalities. There is mild left ventricular hypertrophy. Left ventricular diastolic parameters were normal.  2. Right ventricular systolic function is mildly reduced. The right ventricular size is normal. Tricuspid regurgitation signal is inadequate for assessing PA pressure.  3. The mitral valve is degenerative. No evidence of mitral valve regurgitation. No evidence of mitral stenosis. Moderate mitral annular calcification.  4. The aortic valve is tricuspid. Aortic valve regurgitation is not visualized. Aortic valve sclerosis/calcification is present, without any evidence of aortic stenosis.  5. The inferior vena cava is normal in size with greater than 50% respiratory variability, suggesting right atrial pressure of 3 mmHg. FINDINGS  Left Ventricle: Left ventricular ejection fraction, by estimation, is 60 to 65%. The left ventricle has normal function. The left ventricle has no regional wall motion abnormalities. The left ventricular internal cavity size was normal in size. There is  mild left ventricular hypertrophy. Left ventricular diastolic parameters were normal. Right Ventricle: The right ventricular size is normal. No increase in right ventricular wall thickness. Right ventricular systolic function is mildly reduced. Tricuspid regurgitation signal is inadequate for assessing PA pressure. Left Atrium: Left atrial size was normal in size. Right Atrium: Right atrial size was normal in size. Pericardium: There is no evidence of pericardial effusion. Mitral Valve: The mitral valve is degenerative in appearance. Moderate mitral annular calcification. No evidence of mitral valve regurgitation. No evidence of mitral valve stenosis. Tricuspid Valve: The tricuspid valve is normal in structure. Tricuspid valve regurgitation is trivial. Aortic Valve: The aortic valve is  tricuspid. Aortic valve regurgitation is not visualized. Aortic valve sclerosis/calcification is present, without any evidence of aortic stenosis. Pulmonic Valve: The pulmonic valve was not well visualized. Pulmonic valve regurgitation is trivial. Aorta: The aortic root and ascending aorta are structurally normal, with no evidence of dilitation. Venous: The inferior vena cava is normal in size with greater than 50% respiratory variability, suggesting right atrial pressure of 3 mmHg. IAS/Shunts: No atrial level shunt detected by color flow Doppler.  LEFT VENTRICLE PLAX 2D LVIDd:         4.80 cm   Diastology LVIDs:         3.50 cm   LV e' medial:  7.20 cm/s LV PW:         1.00 cm   LV E/e' medial:  8.2 LV IVS:        0.90 cm   LV e' lateral:   11.70 cm/s LVOT diam:     2.10 cm   LV E/e' lateral: 5.0 LV SV:         75 LV SV Index:   38 LVOT Area:     3.46 cm  RIGHT VENTRICLE            IVC RV S prime:     9.38 cm/s  IVC diam: 1.40 cm TAPSE (M-mode): 1.6 cm LEFT ATRIUM             Index        RIGHT ATRIUM           Index LA diam:        3.30 cm 1.68 cm/m   RA Area:     13.00 cm LA Vol (A2C):   34.7 ml 17.68 ml/m  RA Volume:   31.60 ml  16.10 ml/m LA Vol (A4C):   32.6 ml 16.61 ml/m LA Biplane Vol: 34.3 ml 17.47 ml/m  AORTIC VALVE LVOT Vmax:   105.00 cm/s LVOT Vmean:  66.600 cm/s LVOT VTI:    0.217 m  AORTA Ao Root diam: 3.20 cm Ao Asc diam:  3.30 cm MITRAL VALVE MV Area (PHT): 2.37 cm    SHUNTS MV Decel Time: 320 msec    Systemic VTI:  0.22 m MV E velocity: 58.70 cm/s  Systemic Diam: 2.10 cm MV A velocity: 80.20 cm/s MV E/A ratio:  0.73 Oswaldo Milian MD Electronically signed by Oswaldo Milian MD Signature Date/Time: 02/09/2022/2:33:59 PM    Final    VAS [place] LOWER EXTREMITY VENOUS (DVT) (7a-7p)  Result Date: 02/08/2022  Lower Venous DVT Study Patient Name:  ARAFAT COCUZZA  Date of Exam:   02/08/2022 Medical Rec #: [id number]          Accession #:    [id number] Date of Birth: 05/02/1932          Patient Gender: M Patient Age:   54 years Exam Location:  [facility] Procedure:      VAS [place] LOWER EXTREMITY VENOUS (DVT) Referring Phys: Gertie Fey TRAN --------------------------------------------------------------------------------  Indications: Pain, and Swelling in left calf for 2 weeks.  Comparison Study: No priors. Performing Technologist: Oda Cogan RDMS, RVT  Examination Guidelines: A complete evaluation includes B-mode imaging, spectral Doppler, color Doppler, and power Doppler as needed of all accessible portions of each vessel. Bilateral testing is considered an integral part of a complete examination. Limited examinations for reoccurring indications may be performed as noted. The reflux portion of the exam is performed with the patient in reverse Trendelenburg.  +---------+---------------+---------+-----------+----------+-----------------+ RIGHT    CompressibilityPhasicitySpontaneityPropertiesThrombus Aging    +---------+---------------+---------+-----------+----------+-----------------+ CFV      Full           Yes      Yes                                    +---------+---------------+---------+-----------+----------+-----------------+ SFJ      Full                                                           +---------+---------------+---------+-----------+----------+-----------------+  FV Prox  Full                                                           +---------+---------------+---------+-----------+----------+-----------------+ FV Mid   Full                                                           +---------+---------------+---------+-----------+----------+-----------------+ FV DistalFull                                                           +---------+---------------+---------+-----------+----------+-----------------+ PFV      Full                                                            +---------+---------------+---------+-----------+----------+-----------------+ POP      Full           Yes      Yes                                    +---------+---------------+---------+-----------+----------+-----------------+ PTV      Partial                                      Age Indeterminate +---------+---------------+---------+-----------+----------+-----------------+ PERO     Partial                                      Age Indeterminate +---------+---------------+---------+-----------+----------+-----------------+   +---------+---------------+---------+-----------+----------+-----------------+ LEFT     CompressibilityPhasicitySpontaneityPropertiesThrombus Aging    +---------+---------------+---------+-----------+----------+-----------------+ CFV      Full           Yes      Yes                                    +---------+---------------+---------+-----------+----------+-----------------+ SFJ      Full                                                           +---------+---------------+---------+-----------+----------+-----------------+ FV Prox  Full                                                           +---------+---------------+---------+-----------+----------+-----------------+  FV Mid   Full                                                           +---------+---------------+---------+-----------+----------+-----------------+ FV DistalFull                                                           +---------+---------------+---------+-----------+----------+-----------------+ PFV      Full                                                           +---------+---------------+---------+-----------+----------+-----------------+ POP      None           No       No                   Acute             +---------+---------------+---------+-----------+----------+-----------------+ PTV      None                                          Age Indeterminate +---------+---------------+---------+-----------+----------+-----------------+ PERO     None                                         Age Indeterminate +---------+---------------+---------+-----------+----------+-----------------+     Summary: RIGHT: - Findings consistent with age indeterminate deep vein thrombosis involving the right posterior tibial veins, and right peroneal veins.  LEFT: - Findings consistent with acute deep vein thrombosis involving the left popliteal vein. - Findings consistent with age indeterminate deep vein thrombosis involving the left posterior tibial veins, and left peroneal veins.  *See table(s) above for measurements and observations. Electronically signed by Deitra Mayo MD on 02/08/2022 at 3:35:29 PM.    Final     Microbiology: Results for orders placed or performed during the hospital encounter of 09/25/10  MRSA PCR Screening     Status: None   Collection Time: 09/27/10 12:15 PM  Result Value Ref Range Status   MRSA by PCR  NEGATIVE Final    NEGATIVE        The GeneXpert MRSA Assay (FDA approved for NASAL specimens only), is one component of a comprehensive MRSA colonization surveillance program. It is not intended to diagnose MRSA infection nor to guide or monitor treatment for MRSA infections.    Labs: CBC: Recent Labs  Lab 02/07/22 1141 02/08/22 1255 02/09/22 0717 02/10/22 0255  WBC 4.4 5.2 4.4 4.3  NEUTROABS 2.3 2.7 2.2  --   HGB 12.6* 12.5* 12.1* 11.5*  HCT 37.8* 38.0* 35.6* 34.5*  MCV 96.4 99.5 96.5 96.9  PLT 97.0* 93* 85* 98*   Basic Metabolic Panel: Recent Labs  Lab 02/07/22 1141 02/08/22 1255 02/08/22 1538 02/09/22 0717  NA  140 138  --  139  K 4.6 4.0  --  4.0  CL 103 104  --  107  CO2 31 28  --  26  GLUCOSE 121* 144*  --  108*  BUN 23 21  --  14  CREATININE 0.84 0.77  --  0.77  CALCIUM 9.6 9.2  --  8.8*  MG  --   --  2.1  --    Liver Function Tests: Recent Labs  Lab 02/07/22 1141  02/08/22 1538  AST 26 22  ALT 29 26  ALKPHOS 69 60  BILITOT 0.5 0.4  PROT 7.0 6.3*  ALBUMIN 4.3 3.5   CBG: Recent Labs  Lab 02/08/22 2214 02/10/22 0755 02/10/22 1223  GLUCAP 202* 132* 118*    Discharge time spent: 38 minutes.   Signed: Hosie Poisson, MD [facility] 02/11/2022

## 2022-02-11 NOTE — ED Provider Notes (Signed)
[facility] DEPT Provider Note   CSN: [id number] Arrival date & time: 02/11/22  0003     History  Chief Complaint  Patient presents with   Dysuria    Michael Johnston is a 88 y.o. male.  HPI     This is an 88 year old male who presents with dysuria.  Patient was just discharged from the hospital yesterday after being admitted for DVT.  He is currently on Eliquis.  Patient states that yesterday evening he developed dysuria and urinary frequency.  He describes going to the bathroom every 15 minutes with small voids.  He denies any back pain or fevers.  He was not catheterized while in the hospital.  Patient denies systemic symptoms such as nausea, vomiting, abdominal pain.  Home Medications Prior to Admission medications   Medication Sig Start Date End Date Taking? Authorizing Provider  cephALEXin (KEFLEX) 500 MG capsule Take 1 capsule (500 mg total) by mouth 3 (three) times daily. 02/11/22  Yes Chyrl Elwell, Barbette Hair, MD  apixaban (ELIQUIS) 5 MG TABS tablet Take 2 tablets (10 mg total) by mouth 2 (two) times daily for 7 days. 02/10/22 02/17/22  Hosie Poisson, MD  apixaban (ELIQUIS) 5 MG TABS tablet Take 1 tablet (5 mg total) by mouth 2 (two) times daily. 02/17/22   Hosie Poisson, MD  aspirin 81 MG tablet Take 81 mg by mouth daily.    [provider]  atorvastatin (LIPITOR) 20 MG tablet TAKE 1 TABLET BY MOUTH EVERY DAY Patient taking differently: Take 20 mg by mouth daily. 07/17/21   Nafziger, Tommi Rumps, NP  cyanocobalamin (,VITAMIN B-12,) 1000 MCG/ML injection INJECT 1ML INTRAMUSCULARLY EVERY OTHER WEEK Patient taking differently: Inject 1,000 mcg into the muscle See admin instructions. INJECT 1000 mcg  INTRAMUSCULARLY every other week per patient and family member 10/03/21   Dorothyann Peng, NP  diclofenac Sodium (VOLTAREN) 1 % GEL Apply 2 g topically 4 (four) times daily as needed. Patient taking differently: Apply 2 g topically 4 (four) times daily as needed (For  pain). 09/15/20   Long, Wonda Olds, MD  EPINEPHrine 0.3 mg/0.3 mL IJ SOAJ injection  03/06/18   [provider]  ketoconazole (NIZORAL) 2 % shampoo USE TWICE WEEKLY Patient taking differently: Apply 1 application. topically 2 (two) times a week. 05/18/21   Nafziger, Tommi Rumps, NP  metFORMIN (GLUCOPHAGE) 500 MG tablet Take 1 tablet (500 mg total) by mouth daily with breakfast. 07/17/21   Nafziger, Tommi Rumps, NP  metoprolol tartrate (LOPRESSOR) 25 MG tablet Take 1 tablet (25 mg total) by mouth 2 (two) times daily. 07/17/21   Nafziger, Tommi Rumps, NP  nitroGLYCERIN (NITROSTAT) 0.4 MG SL tablet Place 1 tablet (0.4 mg total) under the tongue every 5 (five) minutes as needed for chest pain (3 doses max). 01/31/16   Josue Hector, MD  SYRINGE-NEEDLE, DISP, 3 ML (B-D 3CC LUER-LOK SYR 25GX1") 25G X 1" 3 ML MISC USE AS DIRECTED TO INJECT B12 02/07/22   Nafziger, Tommi Rumps, NP      Allergies    Levaquin [levofloxacin in d5w], Other, and Metformin and related    Review of Systems   Review of Systems  Constitutional:  Negative for fever.  Gastrointestinal:  Negative for abdominal pain.  Genitourinary:  Positive for dysuria and frequency.  All other systems reviewed and are negative.  Physical Exam Updated Vital Signs BP (!) 168/82 (BP Location: Right Arm)   Pulse 72   Temp 97.6 F (36.4 C) (Oral)   Resp 18   SpO2 100%  Physical Exam Vitals and nursing note reviewed.  Constitutional:      Appearance: He is well-developed.     Comments: Elderly, non-ill-appearing  HENT:     Head: Normocephalic and atraumatic.     Mouth/Throat:     Mouth: Mucous membranes are moist.  Eyes:     Pupils: Pupils are equal, round, and reactive to light.  Cardiovascular:     Rate and Rhythm: Normal rate and regular rhythm.     Heart sounds: Normal heart sounds. No murmur heard. Pulmonary:     Effort: Pulmonary effort is normal. No respiratory distress.     Breath sounds: Normal breath sounds. No wheezing.  Abdominal:      Palpations: Abdomen is soft.     Tenderness: There is no abdominal tenderness. There is no rebound.  Musculoskeletal:     Cervical back: Neck supple.  Skin:    General: Skin is warm and dry.  Neurological:     Mental Status: He is alert and oriented to person, place, and time.  Psychiatric:        Mood and Affect: Mood normal.    ED Results / Procedures / Treatments   Labs (all labs ordered are listed, but only abnormal results are displayed) Labs Reviewed  URINALYSIS, ROUTINE W REFLEX MICROSCOPIC - Abnormal; Notable for the following components:      Result Value   Color, Urine ORANGE (*)    APPearance HAZY (*)    Hgb urine dipstick LARGE (*)    Protein, ur >300 (*)    Leukocytes,Ua LARGE (*)    RBC / HPF >50 (*)    WBC, UA >50 (*)    Bacteria, UA FEW (*)    All other components within normal limits  URINE CULTURE    EKG None  Radiology EEG adult  Result Date: 02/09/2022 Michael Havens, MD     02/09/2022  6:06 AM Patient Name: Michael Johnston MRN: [id number] Epilepsy Attending: Lora Johnston Referring Physician/Provider: Hosie Poisson, MD Date: 02/08/2022 Duration: 23.36 mins Patient history: 88yo M with an episode of unresponsiveness. EEG to evaluate for seizure Level of alertness: Awake AEDs during EEG study: None Technical aspects: This EEG study was done with scalp electrodes positioned according to the 10-20 International system of electrode placement. Electrical activity was acquired at a sampling rate of '500Hz'$  and reviewed with a high frequency filter of '70Hz'$  and a low frequency filter of '1Hz'$ . EEG data were recorded continuously and digitally stored. Description: The posterior dominant rhythm consists of 9 Hz activity of moderate voltage (25-35 uV) seen predominantly in posterior head regions, symmetric and reactive to eye opening and eye closing. Hyperventilation and photic stimulation were not performed.   IMPRESSION: This study is within normal limits. No seizures or  epileptiform discharges were seen throughout the recording. Michael Johnston   ECHOCARDIOGRAM COMPLETE  Result Date: 02/09/2022    ECHOCARDIOGRAM REPORT   Patient Name:   Michael Johnston Date of Exam: 02/09/2022 Medical Rec #:  [id number]         Height:       71.0 in Accession #:    [id number]        Weight:       169.0 lb Date of Birth:  05/24/1932        BSA:          1.963 m Patient Age:    8 years          BP:  130/63 mmHg Patient Gender: M                 HR:           63 bpm. Exam Location:  Inpatient Procedure: 2D Echo Indications:    pulmonary embolus  History:        Patient has prior history of Echocardiogram examinations, most                 recent 05/26/2015. CAD, Prior CABG; Risk Factors:Dyslipidemia,                 Diabetes and Sleep Apnea.  Sonographer:    Johny Chess RDCS Referring Phys: [place]  1. Left ventricular ejection fraction, by estimation, is 60 to 65%. The left ventricle has normal function. The left ventricle has no regional wall motion abnormalities. There is mild left ventricular hypertrophy. Left ventricular diastolic parameters were normal.  2. Right ventricular systolic function is mildly reduced. The right ventricular size is normal. Tricuspid regurgitation signal is inadequate for assessing PA pressure.  3. The mitral valve is degenerative. No evidence of mitral valve regurgitation. No evidence of mitral stenosis. Moderate mitral annular calcification.  4. The aortic valve is tricuspid. Aortic valve regurgitation is not visualized. Aortic valve sclerosis/calcification is present, without any evidence of aortic stenosis.  5. The inferior vena cava is normal in size with greater than 50% respiratory variability, suggesting right atrial pressure of 3 mmHg. FINDINGS  Left Ventricle: Left ventricular ejection fraction, by estimation, is 60 to 65%. The left ventricle has normal function. The left ventricle has no regional wall motion  abnormalities. The left ventricular internal cavity size was normal in size. There is  mild left ventricular hypertrophy. Left ventricular diastolic parameters were normal. Right Ventricle: The right ventricular size is normal. No increase in right ventricular wall thickness. Right ventricular systolic function is mildly reduced. Tricuspid regurgitation signal is inadequate for assessing PA pressure. Left Atrium: Left atrial size was normal in size. Right Atrium: Right atrial size was normal in size. Pericardium: There is no evidence of pericardial effusion. Mitral Valve: The mitral valve is degenerative in appearance. Moderate mitral annular calcification. No evidence of mitral valve regurgitation. No evidence of mitral valve stenosis. Tricuspid Valve: The tricuspid valve is normal in structure. Tricuspid valve regurgitation is trivial. Aortic Valve: The aortic valve is tricuspid. Aortic valve regurgitation is not visualized. Aortic valve sclerosis/calcification is present, without any evidence of aortic stenosis. Pulmonic Valve: The pulmonic valve was not well visualized. Pulmonic valve regurgitation is trivial. Aorta: The aortic root and ascending aorta are structurally normal, with no evidence of dilitation. Venous: The inferior vena cava is normal in size with greater than 50% respiratory variability, suggesting right atrial pressure of 3 mmHg. IAS/Shunts: No atrial level shunt detected by color flow Doppler.  LEFT VENTRICLE PLAX 2D LVIDd:         4.80 cm   Diastology LVIDs:         3.50 cm   LV e' medial:    7.20 cm/s LV PW:         1.00 cm   LV E/e' medial:  8.2 LV IVS:        0.90 cm   LV e' lateral:   11.70 cm/s LVOT diam:     2.10 cm   LV E/e' lateral: 5.0 LV SV:         75 LV SV Index:   38 LVOT Area:  3.46 cm  RIGHT VENTRICLE            IVC RV S prime:     9.38 cm/s  IVC diam: 1.40 cm TAPSE (M-mode): 1.6 cm LEFT ATRIUM             Index        RIGHT ATRIUM           Index LA diam:        3.30 cm 1.68  cm/m   RA Area:     13.00 cm LA Vol (A2C):   34.7 ml 17.68 ml/m  RA Volume:   31.60 ml  16.10 ml/m LA Vol (A4C):   32.6 ml 16.61 ml/m LA Biplane Vol: 34.3 ml 17.47 ml/m  AORTIC VALVE LVOT Vmax:   105.00 cm/s LVOT Vmean:  66.600 cm/s LVOT VTI:    0.217 m  AORTA Ao Root diam: 3.20 cm Ao Asc diam:  3.30 cm MITRAL VALVE MV Area (PHT): 2.37 cm    SHUNTS MV Decel Time: 320 msec    Systemic VTI:  0.22 m MV E velocity: 58.70 cm/s  Systemic Diam: 2.10 cm MV A velocity: 80.20 cm/s MV E/A ratio:  0.73 Oswaldo Milian MD Electronically signed by Oswaldo Milian MD Signature Date/Time: 02/09/2022/2:33:59 PM    Final     Procedures Procedures    Medications Ordered in ED Medications  cefTRIAXone (ROCEPHIN) 1 g in sodium chloride 0.9 % 100 mL IVPB (has no administration in time range)    ED Course/ Medical Decision Making/ A&P                           Medical Decision Making Amount and/or Complexity of Data Reviewed Labs: ordered.  Risk Prescription drug management.   This patient presents to the ED for concern of dysuria, frequency, this involves an extensive number of treatment options, and is a complaint that carries with it a high risk of complications and morbidity.  I considered the following differential and admission for this acute, potentially life threatening condition.  The differential diagnosis includes cystitis, pyelonephritis, kidney stone  MDM:    This is an 88 year old male who presents with dysuria and frequency.  He is nontoxic-appearing and vital signs are notable for blood pressure 168/82.  He is systemically well-appearing and afebrile.  Urinalysis shows large leukocyte Estrace, greater than 50 red cells, greater than 50 white cells, few bacteria.  Feel this is likely consistent with UTI.  Urine culture was sent.  I have reviewed his labs from discharge yesterday.  He had no significant leukocytosis or metabolic derangements at that time.  He wishes to forego lab work  at this time which I think is reasonable as he is well-appearing.  He was given a dose of Rocephin in the emergency department.  Will discharge with Keflex.  He has a urologist and a primary physician he can follow-up with.  He and his son were given strict return precautions.  Given lack of systemic symptoms, fever, back pain, doubt pyelonephritis or kidney stone  (Labs, imaging, consults)  Labs: I Ordered, and personally interpreted labs.  The pertinent results include: Urinalysis  Imaging Studies ordered: I ordered imaging studies including none I independently visualized and interpreted imaging. I agree with the radiologist interpretation  Additional history obtained from son at bedside.  External records from outside source obtained and reviewed including recent discharge summary  Cardiac Monitoring: The patient was maintained on a  cardiac monitor.  I personally viewed and interpreted the cardiac monitored which showed an underlying rhythm of: Normal sinus rhythm  Reevaluation: After the interventions noted above, I reevaluated the patient and found that they have :improved  Social Determinants of Health: Elderly, lives independently  Disposition: Discharge  Co morbidities that complicate the patient evaluation  Past Medical History:  Diagnosis Date   Arthritis    "right shoulder" (05/24/2015)   CHEST PAIN    Chronic bronchitis ([place])    "get it ~ q yr" (05/24/2015)   COLONIC POLYPS, HX OF    COPD (chronic obstructive pulmonary disease) ([place])    "dx'd but I don't take RX for it" (05/24/2015)   Coronary artery disease    Cystic kidney disease    GERD (gastroesophageal reflux disease)    History of hiatal hernia    MI (myocardial infarction) ([place]) 09/2010   Mixed hyperlipidemia    Skin cancer    "top of my head only" (05/24/2015)   Sleep apnea    Syncope and collapse ~ 2013; 05/24/2015     Medicines Meds ordered this encounter  Medications   DISCONTD: cefTRIAXone  (ROCEPHIN) injection 1 g    Order Specific Question:   Antibiotic Indication:    Answer:   UTI   cefTRIAXone (ROCEPHIN) 1 g in sodium chloride 0.9 % 100 mL IVPB    Order Specific Question:   Antibiotic Indication:    Answer:   UTI   cephALEXin (KEFLEX) 500 MG capsule    Sig: Take 1 capsule (500 mg total) by mouth 3 (three) times daily.    Dispense:  21 capsule    Refill:  0    I have reviewed the patients home medicines and have made adjustments as needed  Problem List / ED Course: Problem List Items Addressed This Visit   None Visit Diagnoses     Acute cystitis without hematuria    -  Primary                   Final Clinical Impression(s) / ED Diagnoses Final diagnoses:  Acute cystitis without hematuria    Rx / DC Orders ED Discharge Orders          Ordered    cephALEXin (KEFLEX) 500 MG capsule  3 times daily        02/11/22 0151              Joandy Burget, Barbette Hair, MD 02/11/22 0155

## 2022-02-11 NOTE — Discharge Instructions (Signed)
You were seen today with burning with urination.  You will be sent home on an antibiotic.  If you develop fevers or systemic symptoms, you should be reevaluated immediately.  Follow-up closely with your primary doctor and urologist.

## 2022-02-11 NOTE — ED Triage Notes (Signed)
Pt was discharged yesterday from hospital, PE & bilateral DVTs.  Returns here today with dysuria and urinary frequency.   Urine is bld tinged

## 2022-02-12 ENCOUNTER — Other Ambulatory Visit: Payer: Self-pay | Admitting: Adult Health

## 2022-02-12 ENCOUNTER — Telehealth: Payer: Self-pay

## 2022-02-12 MED ORDER — APIXABAN 5 MG PO TABS: 5.0000 mg | ORAL_TABLET | Freq: Two times a day (BID) | ORAL | 3 refills | Status: DC

## 2022-02-12 NOTE — Telephone Encounter (Signed)
Please advise if this is okay to send in

## 2022-02-12 NOTE — Telephone Encounter (Signed)
Transition Care Management Unsuccessful Follow-up Telephone Call  Date of discharge and from where:  [facility] 02/08/22-02/10/22  Attempts:  1st Attempt  Reason for unsuccessful TCM follow-up call:  No answer/busy  Johnney Killian, RN, BSN, [facility] Phone: [phone]: [phone]

## 2022-02-12 NOTE — Telephone Encounter (Signed)
Spoke to [facility] and he stated that pt will do (2) 5 mg tablets in the morning and (2) 5 mg tablets at night for 7 days. [facility] stated they are needing a prescription after the 7 days for pt to take only 5 mg in the morning and 5 mg at night. So pt is going from '20mg'$  to '10mg'$  per day.

## 2022-02-15 ENCOUNTER — Encounter: Payer: Self-pay | Admitting: Adult Health

## 2022-02-15 ENCOUNTER — Ambulatory Visit (INDEPENDENT_AMBULATORY_CARE_PROVIDER_SITE_OTHER): Payer: Medicare Other | Admitting: Adult Health

## 2022-02-15 VITALS — BP 120/70 | HR 59 | Temp 97.7°F | Ht 71.0 in | Wt 167.0 lb

## 2022-02-15 DIAGNOSIS — R4189 Other symptoms and signs involving cognitive functions and awareness: Secondary | ICD-10-CM

## 2022-02-15 DIAGNOSIS — G4730 Sleep apnea, unspecified: Secondary | ICD-10-CM

## 2022-02-15 DIAGNOSIS — I2510 Atherosclerotic heart disease of native coronary artery without angina pectoris: Secondary | ICD-10-CM | POA: Diagnosis not present

## 2022-02-15 DIAGNOSIS — I82443 Acute embolism and thrombosis of tibial vein, bilateral: Secondary | ICD-10-CM | POA: Diagnosis not present

## 2022-02-15 DIAGNOSIS — N3001 Acute cystitis with hematuria: Secondary | ICD-10-CM | POA: Diagnosis not present

## 2022-02-15 DIAGNOSIS — I2699 Other pulmonary embolism without acute cor pulmonale: Secondary | ICD-10-CM | POA: Diagnosis not present

## 2022-02-15 LAB — CBC WITH DIFFERENTIAL/PLATELET
Basophils Absolute: 0 10*3/uL (ref 0.0–0.1)
Basophils Relative: 1 % (ref 0.0–3.0)
Eosinophils Absolute: 0.2 10*3/uL (ref 0.0–0.7)
Eosinophils Relative: 3.6 % (ref 0.0–5.0)
HCT: 37.6 % — ABNORMAL LOW (ref 39.0–52.0)
Hemoglobin: 12.8 g/dL — ABNORMAL LOW (ref 13.0–17.0)
Lymphocytes Relative: 31.1 % (ref 12.0–46.0)
Lymphs Abs: 1.5 10*3/uL (ref 0.7–4.0)
MCHC: 34 g/dL (ref 30.0–36.0)
MCV: 96.1 fl (ref 78.0–100.0)
Monocytes Absolute: 0.4 10*3/uL (ref 0.1–1.0)
Monocytes Relative: 9.6 % (ref 3.0–12.0)
Neutro Abs: 2.6 10*3/uL (ref 1.4–7.7)
Neutrophils Relative %: 54.7 % (ref 43.0–77.0)
Platelets: 137 10*3/uL — ABNORMAL LOW (ref 150.0–400.0)
RBC: 3.92 Mil/uL — ABNORMAL LOW (ref 4.22–5.81)
RDW: 14.1 % (ref 11.5–15.5)
WBC: 4.7 10*3/uL (ref 4.0–10.5)

## 2022-02-15 LAB — BASIC METABOLIC PANEL
BUN: 23 mg/dL (ref 6–23)
CO2: 27 mEq/L (ref 19–32)
Calcium: 9.4 mg/dL (ref 8.4–10.5)
Chloride: 103 mEq/L (ref 96–112)
Creatinine, Ser: 0.82 mg/dL (ref 0.40–1.50)
GFR: 78.91 mL/min (ref >60.00)
Glucose, Bld: 111 mg/dL — ABNORMAL HIGH (ref 70–99)
Potassium: 4.1 mEq/L (ref 3.5–5.1)
Sodium: 138 mEq/L (ref 135–145)

## 2022-02-15 NOTE — Patient Instructions (Signed)
It was great seeing you today   We will follow up with you regarding your blood work   Someone from the pulmonary office will call you to schedule your appointment

## 2022-02-15 NOTE — Progress Notes (Signed)
Subjective:    Patient ID: Michael Johnston, male    DOB: Mar 07, 1932, 88 y.o.   MRN: [id number]  HPI  88 year old male who  has a past medical history of Arthritis, CHEST PAIN, Chronic bronchitis ([place]), COLONIC POLYPS, HX OF, COPD (chronic obstructive pulmonary disease) ([place]), Coronary artery disease, Cystic kidney disease, GERD (gastroesophageal reflux disease), History of hiatal hernia, MI (myocardial infarction) ([place]) (09/2010), Mixed hyperlipidemia, Skin cancer, Sleep apnea, and Syncope and collapse (~ 2013; 05/24/2015).  He presents to the office today for TCM visit   Admit Date 02/08/2022  Discharge Date 02/09/2022  He presented to the emergency room after being seen by this writer for right leg swelling.  His D-dimer was elevated with lab work.  He was supposed to have his ultrasound that day to rule out DVT but got worried due to elevated D-dimer and went to the emergency room.  His imaging showed PE (Acute subsegmental pulmonary emboli in the right upper and lower lobes. Clot burden is small) and bilateral age-indeterminate DVT.  Hospital Course   PE and Bilateral DVT -He was started on IV heparin and transition to oral Eliquis.  It was recommended that he follow-up outpatient with hematology for evaluation of hypercoagulable state.  Echocardiogram did not show any new right heart strain.  Unresponsiveness  -While in the emergency room he had a short period of unresponsiveness.  It was thought that this was possibly from contrast injection.  His MRI was unremarkable and EEG was negative for seizures.  History of CAD status post CABG -No chest pain or shortness of breath  He is with his son today who helps supplement history.   Since getting out of the hospital he had to return to the ER a few days ago for UTI like symptoms. His culture has showed a UTI and he was placed on Keflex. He does report improvement in his symptoms.   Today he reports that he is doing well overall. The  swelling in his legs is starting to improve. He is tolerating the eliquis well. He has his appointment with Hematology later this month   His son reports concern for sleep apnea as he has witnesses apneic episodes and snores. He would like him to have a sleep study done   No CP or SOB. Has had some mild fatigue since being discharged from the hospital   Review of Systems  Constitutional: Negative.   HENT: Negative.    Eyes: Negative.   Respiratory: Negative.    Cardiovascular:  Positive for leg swelling.  Gastrointestinal: Negative.   Endocrine: Negative.   Genitourinary: Negative.   Musculoskeletal: Negative.   Skin: Negative.   Allergic/Immunologic: Negative.   Neurological: Negative.   Hematological: Negative.   Psychiatric/Behavioral:  Positive for sleep disturbance.   All other systems reviewed and are negative.  Past Medical History:  Diagnosis Date   Arthritis    "right shoulder" (05/24/2015)   CHEST PAIN    Chronic bronchitis ([place])    "get it ~ q yr" (05/24/2015)   COLONIC POLYPS, HX OF    COPD (chronic obstructive pulmonary disease) ([place])    "dx'd but I don't take RX for it" (05/24/2015)   Coronary artery disease    Cystic kidney disease    GERD (gastroesophageal reflux disease)    History of hiatal hernia    MI (myocardial infarction) ([place]) 09/2010   Mixed hyperlipidemia    Skin cancer    "top of my head only" (05/24/2015)  Sleep apnea    Syncope and collapse ~ 2013; 05/24/2015    Social History   Socioeconomic History   Marital status: Married    Spouse name: Not on file   Number of children: Not on file   Years of education: Not on file   Highest education level: Not on file  Occupational History   Not on file  Tobacco Use   Smoking status: Former    Packs/day: 1.50    Years: 3.00    Total pack years: 4.50    Types: Cigarettes    Quit date: 12/09/1951    Years since quitting: 70.2   Smokeless tobacco: Never  Vaping Use   Vaping Use: Never used   Substance and Sexual Activity   Alcohol use: Not Currently    Comment: "no alcohol since 1953"   Drug use: No   Sexual activity: Not on file  Other Topics Concern   Not on file  Social History Narrative   Retired    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[place] Strain: [place]  (10/16/2021)   Overall Financial Resource Strain (CARDIA)    Difficulty of Paying Living Expenses: Not hard at all  Food Insecurity: No Food Insecurity (10/16/2021)   Hunger Vital Sign    Worried About Running Out of Food in the Last Year: Never true    Ridge in the Last Year: Never true  Transportation Needs: No Transportation Needs (10/16/2021)   PRAPARE - Hydrologist (Medical): No    Lack of Transportation (Non-Medical): No  Physical Activity: Insufficiently Active (10/16/2021)   Exercise Vital Sign    Days of Exercise per Week: 2 days    Minutes of Exercise per Session: 40 min  Stress: No Stress Concern Present (10/16/2021)   [place]    Feeling of Stress : Not at all  Social Connections: [place] (10/16/2021)   Social Connection and Isolation Panel [NHANES]    Frequency of Communication with Friends and Family: More than three times a week    Frequency of Social Gatherings with Friends and Family: More than three times a week    Attends Religious Services: More than 4 times per year    Active Member of [employer] or Organizations: Yes    Attends Music therapist: More than 4 times per year    Marital Status: Married  Human resources officer Violence: Not At Risk (10/16/2021)   Humiliation, Afraid, Rape, and Kick questionnaire    Fear of Current or Ex-Partner: No    Emotionally Abused: No    Physically Abused: No    Sexually Abused: No    Past Surgical History:  Procedure Laterality Date   CARDIAC CATHETERIZATION  09/2010   [place]   "partial"   CORONARY ANGIOPLASTY     CORONARY ARTERY BYPASS GRAFT  09/2010   Median sternotomy for coronary artery bypass grafting x3  (left internal mammary artery to distal left anterior  descending  coronary artery, saphenous vein graft to first diagonal branch,  saphenous vein graft to first  obtuse marginal branch, endoscopic  saphenous vein harvest from right thigh). SURGEON:  Valentina Gu.  Roxy Manns, MD  ASSISTANT:  John Giovanni, PA-C  ANESTHESIA:  Glynda Jaeger, MD    LEFT HEART CATH AND CORS/GRAFTS ANGIOGRAPHY N/A  08/22/2017   Procedure: LEFT HEART CATH AND CORS/GRAFTS ANGIOGRAPHY;  Surgeon: Martinique, Peter M, MD;  Location: [facility];  Service: Cardiovascular;  Laterality: N/A;   SKIN CANCER EXCISION     "top of my head"    Family History  Problem Relation Age of Onset   Lung cancer Sister    Heart attack Father     Allergies  Allergen Reactions   Levaquin [Levofloxacin In D5w] Swelling and Other (See Comments)    Eyes swollen   Other Anaphylaxis and Other (See Comments)    Bee sting   Metformin And Related Nausea Only    Current Outpatient Medications on File Prior to Visit  Medication Sig Dispense Refill   [START ON 02/17/2022] apixaban (ELIQUIS) 5 MG TABS tablet Take 1 tablet (5 mg total) by mouth 2 (two) times daily. 180 tablet 3   aspirin 81 MG tablet Take 81 mg by mouth daily.     atorvastatin (LIPITOR) 20 MG tablet TAKE 1 TABLET BY MOUTH EVERY DAY (Patient taking differently: Take 20 mg by mouth daily.) 90 tablet 3   cephALEXin (KEFLEX) 500 MG capsule Take 1 capsule (500 mg total) by mouth 3 (three) times daily. 21 capsule 0   cyanocobalamin (,VITAMIN B-12,) 1000 MCG/ML injection INJECT 1ML INTRAMUSCULARLY EVERY OTHER WEEK (Patient taking differently: Inject 1,000 mcg into the muscle See admin instructions. INJECT 1000 mcg  INTRAMUSCULARLY every other week per patient and family member) 3 mL 6   diclofenac Sodium (VOLTAREN) 1 % GEL  Apply 2 g topically 4 (four) times daily as needed. (Patient taking differently: Apply 2 g topically 4 (four) times daily as needed (For pain).) 50 g 0   EPINEPHrine 0.3 mg/0.3 mL IJ SOAJ injection      ketoconazole (NIZORAL) 2 % shampoo USE TWICE WEEKLY (Patient taking differently: Apply 1 application. topically 2 (two) times a week.) 120 mL 3   metFORMIN (GLUCOPHAGE) 500 MG tablet Take 1 tablet (500 mg total) by mouth daily with breakfast. 90 tablet 1   metoprolol tartrate (LOPRESSOR) 25 MG tablet Take 1 tablet (25 mg total) by mouth 2 (two) times daily. 180 tablet 3   nitroGLYCERIN (NITROSTAT) 0.4 MG SL tablet Place 1 tablet (0.4 mg total) under the tongue every 5 (five) minutes as needed for chest pain (3 doses max). 25 tablet 1   SYRINGE-NEEDLE, DISP, 3 ML (B-D 3CC LUER-LOK SYR 25GX1") 25G X 1" 3 ML MISC USE AS DIRECTED TO INJECT B12 3 each 3   No current facility-administered medications on file prior to visit.    There were no vitals taken for this visit.      Objective:   Physical Exam Vitals and nursing note reviewed.  Constitutional:      Appearance: Normal appearance.  Cardiovascular:     Rate and Rhythm: Normal rate and regular rhythm.     Pulses: Normal pulses.     Heart sounds: Normal heart sounds.  Pulmonary:     Effort: Pulmonary effort is normal.     Breath sounds: Normal breath sounds.  Musculoskeletal:        General: Normal range of motion.     Right lower leg: 1+ Pitting Edema present.     Left lower leg: 1+ Pitting Edema present.  Skin:    General: Skin is warm and dry.     Capillary Refill: Capillary refill takes less than 2 seconds.  Neurological:     General: No focal deficit present.  Mental Status: He is alert and oriented to person, place, and time.  Psychiatric:        Mood and Affect: Mood normal.        Behavior: Behavior normal.        Thought Content: Thought content normal.        Judgment: Judgment normal.       Assessment & Plan:   1. Acute pulmonary embolism without acute cor pulmonale, unspecified pulmonary embolism type [facility]) - Reviewed hospital notes, imaging, and labs, imaging and discharge instructions. All questions answered to the best of my ability  - CBC with Differential/Platelet; Future - Basic Metabolic Panel; Future - Basic Metabolic Panel - CBC with Differential/Platelet  2. Unresponsiveness  - CBC with Differential/Platelet; Future - Basic Metabolic Panel; Future - Basic Metabolic Panel - CBC with Differential/Platelet  3. Acute deep vein thrombosis (DVT) of tibial vein of both lower extremities (HCC) - Continue Eliquis  - Follow up with Hematology as directed - CBC with Differential/Platelet; Future - Basic Metabolic Panel; Future - Basic Metabolic Panel - CBC with Differential/Platelet  4. Coronary artery disease involving native coronary artery of native heart without angina pectoris - Per cardiology  - CBC with Differential/Platelet; Future - Basic Metabolic Panel; Future - Basic Metabolic Panel - CBC with Differential/Platelet  5. Sleep apnea, unspecified type  - Ambulatory referral to Pulmonology  6. Acute cystitis with hematuria - Seems to have resolved - Finish abx   Dorothyann Peng, NP

## 2022-02-17 ENCOUNTER — Other Ambulatory Visit: Payer: Self-pay | Admitting: Adult Health

## 2022-02-17 DIAGNOSIS — E1149 Type 2 diabetes mellitus with other diabetic neurological complication: Secondary | ICD-10-CM

## 2022-02-17 LAB — URINE CULTURE: Culture: >=100000 — AB

## 2022-02-18 ENCOUNTER — Encounter: Payer: Self-pay | Admitting: Adult Health

## 2022-02-18 ENCOUNTER — Telehealth: Payer: Self-pay

## 2022-02-18 NOTE — Telephone Encounter (Signed)
Post ED Visit - Positive Culture Follow-up  Culture report reviewed by antimicrobial stewardship pharmacist: [place] Team '[x]'$  Sherlon Handing, [place].D. '[]'$  Heide Guile, Pharm.D., BCPS AQ-ID '[]'$  Parks Neptune, Pharm.D., BCPS '[]'$  Alycia Rossetti, Pharm.D., BCPS '[]'$  [place], Pharm.D., BCPS, AAHIVP '[]'$  Legrand Como, Pharm.D., BCPS, AAHIVP '[]'$  Salome Arnt, PharmD, BCPS '[]'$  Johnnette Gourd, PharmD, BCPS '[]'$  Hughes Better, PharmD, BCPS '[]'$  Leeroy Cha, PharmD '[]'$  Laqueta Linden, PharmD, BCPS '[]'$  Albertina Parr, PharmD  [place] Team '[]'$  Leodis Sias, PharmD '[]'$  Lindell Spar, PharmD '[]'$  Royetta Asal, PharmD '[]'$  Graylin Shiver, Rph '[]'$  Rema Fendt) Glennon Mac, PharmD '[]'$  Arlyn Dunning, PharmD '[]'$  Netta Cedars, PharmD '[]'$  Dia Sitter, PharmD '[]'$  Leone Haven, PharmD '[]'$  Gretta Arab, PharmD '[]'$  Theodis Shove, PharmD '[]'$  Peggyann Juba, PharmD '[]'$  Reuel Boom, PharmD   Positive urine culture Treated with Cephalexin, organism sensitive to the same and no further patient follow-up is required at this time.  Glennon Hamilton 02/18/2022, 10:18 AM

## 2022-02-22 ENCOUNTER — Encounter: Payer: Self-pay | Admitting: Adult Health

## 2022-02-22 ENCOUNTER — Ambulatory Visit (INDEPENDENT_AMBULATORY_CARE_PROVIDER_SITE_OTHER): Payer: Medicare Other | Admitting: Adult Health

## 2022-02-22 VITALS — BP 120/64 | HR 77 | Temp 98.4°F | Ht 71.0 in | Wt 168.0 lb

## 2022-02-22 DIAGNOSIS — N3001 Acute cystitis with hematuria: Secondary | ICD-10-CM | POA: Diagnosis not present

## 2022-02-22 LAB — POCT URINALYSIS DIPSTICK
Bilirubin, UA: NEGATIVE
Blood, UA: POSITIVE
Glucose, UA: NEGATIVE
Ketones, UA: NEGATIVE
Nitrite, UA: POSITIVE
Protein, UA: POSITIVE — AB
Spec Grav, UA: 1.025 (ref 1.010–1.025)
Urobilinogen, UA: 0.2 E.U./dL
pH, UA: 6 (ref 5.0–8.0)

## 2022-02-22 MED ORDER — AMOXICILLIN-POT CLAVULANATE 875-125 MG PO TABS: 1.0000 | ORAL_TABLET | Freq: Two times a day (BID) | ORAL | 0 refills | Status: DC

## 2022-02-22 NOTE — Progress Notes (Unsigned)
Subjective:    Patient ID: Michael Johnston, male    DOB: 1931/10/23, 88 y.o.   MRN: [id number]  HPI 88 year old male who  has a past medical history of Arthritis, CHEST PAIN, Chronic bronchitis ([place]), COLONIC POLYPS, HX OF, COPD (chronic obstructive pulmonary disease) ([place]), Coronary artery disease, Cystic kidney disease, GERD (gastroesophageal reflux disease), History of hiatal hernia, MI (myocardial infarction) ([place]) (09/2010), Mixed hyperlipidemia, Skin cancer, Sleep apnea, and Syncope and collapse (~ 2013; 05/24/2015).  He was diagnosed with a UTI on 02/11/2022 the emergency room.  He was given a dose of Rocephin and discharged with Keflex TID x 7 days.  Reports that he finished his antibiotics 3 days ago and his symptoms have returned including frequency and burning. He is also having intermittent episodes of decreased urination.   His urine culture showed   >=100,000 COLONIES/mL PROTEUS MIRABILIS  >=100,000 COLONIES/mL KLEBSIELLA OXYTOCA   Review of Systems See HPI   Past Medical History:  Diagnosis Date   Arthritis    "right shoulder" (05/24/2015)   CHEST PAIN    Chronic bronchitis ([place])    "get it ~ q yr" (05/24/2015)   COLONIC POLYPS, HX OF    COPD (chronic obstructive pulmonary disease) ([place])    "dx'd but I don't take RX for it" (05/24/2015)   Coronary artery disease    Cystic kidney disease    GERD (gastroesophageal reflux disease)    History of hiatal hernia    MI (myocardial infarction) ([place]) 09/2010   Mixed hyperlipidemia    Skin cancer    "top of my head only" (05/24/2015)   Sleep apnea    Syncope and collapse ~ 2013; 05/24/2015    Social History   Socioeconomic History   Marital status: Married    Spouse name: Not on file   Number of children: Not on file   Years of education: Not on file   Highest education level: Not on file  Occupational History   Not on file  Tobacco Use   Smoking status: Former    Packs/day: 1.50    Years: 3.00    Total pack  years: 4.50    Types: Cigarettes    Quit date: 12/09/1951    Years since quitting: 70.2   Smokeless tobacco: Never  Vaping Use   Vaping Use: Never used  Substance and Sexual Activity   Alcohol use: Not Currently    Comment: "no alcohol since 1953"   Drug use: No   Sexual activity: Not on file  Other Topics Concern   Not on file  Social History Narrative   Retired    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 of [employer] Strain: Low Risk  (10/16/2021)   Overall Financial Resource Strain (CARDIA)    Difficulty of Paying Living Expenses: Not hard at all  Food Insecurity: No Food Insecurity (10/16/2021)   Hunger Vital Sign    Worried About Running Out of Food in the Last Year: Never true    Ran Out of Food in the Last Year: Never true  Transportation Needs: No Transportation Needs (10/16/2021)   PRAPARE - Hydrologist (Medical): No    Lack of Transportation (Non-Medical): No  Physical Activity: Insufficiently Active (10/16/2021)   Exercise Vital Sign    Days of Exercise per Week: 2 days    Minutes of Exercise per Session: 40 min  Stress: No Stress Concern  Present (10/16/2021)   [place]    Feeling of Stress : Not at all  Social Connections: [place] (10/16/2021)   Social Connection and Isolation Panel [NHANES]    Frequency of Communication with Friends and Family: More than three times a week    Frequency of Social Gatherings with Friends and Family: More than three times a week    Attends Religious Services: More than 4 times per year    Active Member of [employer] or Organizations: Yes    Attends Music therapist: More than 4 times per year    Marital Status: Married  Human resources officer Violence: Not At Risk (10/16/2021)   Humiliation, Afraid, Rape, and Kick questionnaire    Fear of Current or Ex-Partner: No    Emotionally Abused: No     Physically Abused: No    Sexually Abused: No    Past Surgical History:  Procedure Laterality Date   CARDIAC CATHETERIZATION  09/2010   [place]   "partial"   CORONARY ANGIOPLASTY     CORONARY ARTERY BYPASS GRAFT  09/2010   Median sternotomy for coronary artery bypass grafting x3  (left internal mammary artery to distal left anterior  descending  coronary artery, saphenous vein graft to first diagonal branch,  saphenous vein graft to first  obtuse marginal branch, endoscopic  saphenous vein harvest from right thigh). SURGEON:  Valentina Gu.  Roxy Manns, MD  ASSISTANT:  John Giovanni, PA-C  ANESTHESIA:  Glynda Jaeger, MD    LEFT HEART CATH AND CORS/GRAFTS ANGIOGRAPHY N/A 08/22/2017   Procedure: LEFT HEART CATH AND CORS/GRAFTS ANGIOGRAPHY;  Surgeon: Martinique, Peter M, MD;  Location: [facility];  Service: Cardiovascular;  Laterality: N/A;   SKIN CANCER EXCISION     "top of my head"    Family History  Problem Relation Age of Onset   Lung cancer Sister    Heart attack Father     Allergies  Allergen Reactions   Levaquin [Levofloxacin In D5w] Swelling and Other (See Comments)    Eyes swollen   Other Anaphylaxis and Other (See Comments)    Bee sting   Metformin And Related Nausea Only    Current Outpatient Medications on File Prior to Visit  Medication Sig Dispense Refill   apixaban (ELIQUIS) 5 MG TABS tablet Take 1 tablet (5 mg total) by mouth 2 (two) times daily. 180 tablet 3   aspirin 81 MG tablet Take 81 mg by mouth daily.     atorvastatin (LIPITOR) 20 MG tablet TAKE 1 TABLET BY MOUTH EVERY DAY (Patient taking differently: Take 20 mg by mouth daily.) 90 tablet 3   cephALEXin (KEFLEX) 500 MG capsule Take 1 capsule (500 mg total) by mouth 3 (three) times daily. 21 capsule 0   cyanocobalamin (,VITAMIN B-12,) 1000 MCG/ML injection INJECT 1ML INTRAMUSCULARLY EVERY OTHER WEEK (Patient taking differently: Inject 1,000 mcg into the muscle See admin  instructions. INJECT 1000 mcg  INTRAMUSCULARLY every other week per patient and family member) 3 mL 6   diclofenac Sodium (VOLTAREN) 1 % GEL Apply 2 g topically 4 (four) times daily as needed. (Patient taking differently: Apply 2 g topically 4 (four) times daily as needed (For pain).) 50 g 0   EPINEPHrine 0.3 mg/0.3 mL IJ SOAJ injection      ketoconazole (NIZORAL) 2 % shampoo USE TWICE WEEKLY (Patient taking differently: Apply 1 application  topically 2 (two) times  a week.) 120 mL 3   metFORMIN (GLUCOPHAGE) 500 MG tablet TAKE 1 TABLET BY MOUTH EVERY DAY WITH BREAKFAST 90 tablet 1   metoprolol tartrate (LOPRESSOR) 25 MG tablet Take 1 tablet (25 mg total) by mouth 2 (two) times daily. 180 tablet 3   nitroGLYCERIN (NITROSTAT) 0.4 MG SL tablet Place 1 tablet (0.4 mg total) under the tongue every 5 (five) minutes as needed for chest pain (3 doses max). 25 tablet 1   SYRINGE-NEEDLE, DISP, 3 ML (B-D 3CC LUER-LOK SYR 25GX1") 25G X 1" 3 ML MISC USE AS DIRECTED TO INJECT B12 3 each 3   No current facility-administered medications on file prior to visit.    BP 120/64   Pulse 77   Temp 98.4 F (36.9 C) (Oral)   Ht '5\' 11"'$  (1.803 m)   Wt 168 lb (76.2 kg)   SpO2 98%   BMI 23.43 kg/m       Objective:   Physical Exam Vitals and nursing note reviewed.  Constitutional:      Appearance: Normal appearance.  Cardiovascular:     Rate and Rhythm: Normal rate and regular rhythm.     Pulses: Normal pulses.     Heart sounds: Normal heart sounds.  Pulmonary:     Effort: Pulmonary effort is normal.     Breath sounds: Normal breath sounds.  Skin:    General: Skin is dry.     Capillary Refill: Capillary refill takes less than 2 seconds.  Neurological:     General: No focal deficit present.     Mental Status: He is alert and oriented to person, place, and time.  Psychiatric:        Mood and Affect: Mood normal.        Behavior: Behavior normal.        Thought Content: Thought content normal.         Judgment: Judgment normal.       Assessment & Plan:  1. Acute cystitis with hematuria - Likely unresolved from previous UTI  - Culture, Urine - POC Urinalysis Dipstick + Leuks, nit., glucose, and blood  - amoxicillin-clavulanate (AUGMENTIN) 875-125 MG tablet; Take 1 tablet by mouth 2 (two) times daily for 10 days.  Dispense: 20 tablet; Refill: 0  Dorothyann Peng, NP

## 2022-02-24 LAB — URINE CULTURE
MICRO NUMBER:: 13535924
SPECIMEN QUALITY:: ADEQUATE

## 2022-02-26 ENCOUNTER — Telehealth: Payer: Self-pay | Admitting: Adult Health

## 2022-02-26 MED ORDER — CIPROFLOXACIN HCL 500 MG PO TABS: 500.0000 mg | ORAL_TABLET | Freq: Two times a day (BID) | ORAL | 0 refills | Status: AC

## 2022-02-26 NOTE — Telephone Encounter (Signed)
Spoke to patients son and informed him of urine culture. Will need him to stop Augmentin and start Cipro. He did have an allergic reaction to Levaquin in the past with some mild swelling around his eyes.   Family will monitor closely

## 2022-03-04 ENCOUNTER — Encounter (HOSPITAL_COMMUNITY): Payer: Self-pay | Admitting: Hematology

## 2022-03-04 ENCOUNTER — Encounter (HOSPITAL_COMMUNITY): Payer: Self-pay

## 2022-03-04 ENCOUNTER — Inpatient Hospital Stay (HOSPITAL_COMMUNITY): Payer: Medicare Other | Attending: Physician Assistant | Admitting: Hematology

## 2022-03-04 VITALS — BP 135/71 | HR 67 | Temp 96.5°F | Resp 18 | Ht 70.87 in | Wt 172.4 lb

## 2022-03-04 DIAGNOSIS — Z7901 Long term (current) use of anticoagulants: Secondary | ICD-10-CM

## 2022-03-04 DIAGNOSIS — I2694 Multiple subsegmental pulmonary emboli without acute cor pulmonale: Secondary | ICD-10-CM | POA: Diagnosis not present

## 2022-03-04 DIAGNOSIS — I82453 Acute embolism and thrombosis of peroneal vein, bilateral: Secondary | ICD-10-CM

## 2022-03-04 DIAGNOSIS — I82403 Acute embolism and thrombosis of unspecified deep veins of lower extremity, bilateral: Secondary | ICD-10-CM

## 2022-03-05 ENCOUNTER — Other Ambulatory Visit (HOSPITAL_COMMUNITY): Payer: Self-pay | Admitting: Physician Assistant

## 2022-03-05 DIAGNOSIS — D649 Anemia, unspecified: Secondary | ICD-10-CM

## 2022-03-05 DIAGNOSIS — D696 Thrombocytopenia, unspecified: Secondary | ICD-10-CM

## 2022-05-03 ENCOUNTER — Encounter: Payer: Self-pay | Admitting: Cardiovascular Disease

## 2022-05-06 ENCOUNTER — Telehealth: Payer: Self-pay

## 2022-05-06 NOTE — Telephone Encounter (Addendum)
**Note De-Identified Irini Leet Obfuscation** The pt faxed the providers page of his BMSPA[employer] application for Eliquis to the office.  He also sent [place] a [facility] message requesting that we complete the page, leave it in the front office for him to pick up, and to call him when it is ready to be picked up as he states that he is sending his entire application to [employer] herself.  I completed the page and e-mailed it to Dr Kyla Balzarine nurse so she can obtain his signature, date it, leave it in the front office for the pt to pick up, and to notify me when placed in the front office so I can let the pt know it is ready for pick up.

## 2022-05-07 NOTE — Telephone Encounter (Signed)
Sent mychart message that patient's form is signed and ready to be picked up.

## 2022-05-24 ENCOUNTER — Telehealth: Payer: Self-pay | Admitting: Pharmacist

## 2022-05-24 NOTE — Progress Notes (Signed)
Chronic Care Management Pharmacy Assistant   Name: Michael Johnston  MRN: [id number] DOB: 1932/07/08  Reason for Encounter: Disease State   Conditions to be addressed/monitored: General Assessment   Recent office visits:  02/22/22 Michael Peng, NP - Patient presented for Acute cystitis with hematuria. Prescribed Amoxicillin. Stopped Cephalexin.  02/15/22 Michael Johnston, Michael Rumps, NP - Patient presented for Acute pulmonary embolism without acute cor pulmonale unspecified embolism type and other concerns. No medication changes.  02/07/22 Michael Peng, NP - Patient presented for Lower extremity edema and other concerns. Stopped Benzonatate.  Recent consult visits:  03/04/22 Michael Jack, MD (Hematology/Oncology) - Patient presented for Multiple subsegmental pulmonary emboli without acute cor pulmonale and other concerns.   12/19/21 Michael Hector, MD (Cardiology) - Patient presented for History of CABG and other concerns. No medication changes.  12/06/21 Michael Johnston, PTA (PT) - Patient presented for Unsteadiness on feet and other concerns.No medication changes.  12/04/21 Claims encounter for Unsteadiness on feet and other concerns.No other visit details available.   Hospital visits:  Medication Reconciliation was completed by comparing discharge summary, patient's EMR and Pharmacy list  Patient presented to [facility] ED on 02/11/22 for Acute cystitis without hematuria Patient was present for 1 hour.  New?Medications Started at [facility] Discharge:?? -started  Cephalexin 500 mg  Medication Changes at Hospital Discharge: -Changed  none  Medications Discontinued at Hospital Discharge: -Stopped none  Medications that remain the same after Hospital Discharge:??  -All other medications will remain the same.     Patient presented to [facility] on 02/08/22 for Pulmonary embolus. Patient was present for 2 days.  New?Medications Started at  [facility] Discharge:?? -started  apixaban Arne Cleveland)  Medication Changes at Hospital Discharge: -Changed  none  Medications Discontinued at Hospital Discharge: -Stopped colchicine 0.6 MG tablet  Medications that remain the same after Hospital Discharge:??  -All other medications will remain the same.     Medications: Outpatient Encounter Medications as of 05/24/2022  Medication Sig Note   apixaban (ELIQUIS) 5 MG TABS tablet Take 1 tablet (5 mg total) by mouth 2 (two) times daily.    aspirin 81 MG tablet Take 81 mg by mouth daily.    atorvastatin (LIPITOR) 20 MG tablet TAKE 1 TABLET BY MOUTH EVERY DAY (Patient taking differently: Take 20 mg by mouth daily.)    cyanocobalamin (,VITAMIN B-12,) 1000 MCG/ML injection INJECT 1ML INTRAMUSCULARLY EVERY OTHER WEEK (Patient taking differently: Inject 1,000 mcg into the muscle See admin instructions. INJECT 1000 mcg  INTRAMUSCULARLY every other week per patient and family member)    diclofenac Sodium (VOLTAREN) 1 % GEL Apply 2 g topically 4 (four) times daily as needed. (Patient taking differently: Apply 2 g topically 4 (four) times daily as needed (For pain).)    EPINEPHrine 0.3 mg/0.3 mL IJ SOAJ injection     ketoconazole (NIZORAL) 2 % shampoo USE TWICE WEEKLY (Patient taking differently: Apply 1 application  topically 2 (two) times a week.) 02/08/2022: No specific days    metFORMIN (GLUCOPHAGE) 500 MG tablet TAKE 1 TABLET BY MOUTH EVERY DAY WITH BREAKFAST    metoprolol tartrate (LOPRESSOR) 25 MG tablet Take 1 tablet (25 mg total) by mouth 2 (two) times daily.    nitroGLYCERIN (NITROSTAT) 0.4 MG SL tablet Place 1 tablet (0.4 mg total) under the tongue every 5 (five) minutes as needed for chest pain (3 doses max). (Patient not taking: Reported on 03/04/2022)    SYRINGE-NEEDLE, DISP, 3 ML (B-D 3CC LUER-LOK SYR  25GX1") 25G X 1" 3 ML MISC USE AS DIRECTED TO INJECT B12    No facility-administered encounter medications on file as of 05/24/2022.    [place] for General Review Call  Adherence Review:  Does the Clinical Pharmacist Assistant have access to adherence rates? Yes Adherence rates for STAR metric medications (List medication(s)/day supply/ last 2 fill dates). Atorvastatin 20 mg - [facility] At[facility]vastatin 20 mg - [facility]/16/21 [facility] Metformin 500 mg - Last [facility] Metformin 500 mg - [facility]16/[facility]  Does the patient have >5 day gap between last estimated fill dates for any of the above medications or other medication gaps? No    Disease State Questions:  Able to connect with Patient? Yes Did patient have any problems with their health recently? No  Have you had any admissions or emergency room visits or worsening of your condition(s) since last visit? No  Have you had any visits with new specialists or providers since your last visit? No  Have you had any new health care problem(s) since your last visit? No  Have you run out of any of your medications since you last spoke with clinical pharmacist? No  Are there any medications you are not taking as prescribed? No  Are you having any issues or side effects with your medications? Yes Patient reports he is concerned with the price increase of his Eliquis, he reports his daughter in law is a Marine scientist and working on patient assistance applications for him and wife as she is also on it. Per chart notes she has sent in paperwork to cardiology.  Do you have any other health concerns or questions you want to discuss with your Clinical Pharmacist before your next visit? No  Are there any health concerns that you feel we can do a better job addressing? No  Are you having any problems with any of the following since the last visit: (select all that apply)  None  12. Any falls since last visit? No  13. Any increased or uncontrolled pain since last visit? No      Care Gaps: COVID  Booster - Overdue Zoster Vaccine - Overdue Eye Exam - Overdue CCM- Declined at this time AWV- 2/23 Lab Results  Component Value Date   HGBA1C 6.8 (H) 02/07/2022    Star Rating Drugs: Metformin 500 mg - [facility] Atorvastatin 20 mg - Last filled 02/18/22 90 DS at [place] Pharmacist Assistant [phone]

## 2022-05-30 NOTE — Telephone Encounter (Signed)
**Note De-Identified Renea Schoonmaker Obfuscation** Letter received from [facility] stating that they have approved the pt for Eliquis asst until 09/08/2022. [id number]  The letter states that they have notified the pt of this approval as well.

## 2022-06-11 ENCOUNTER — Other Ambulatory Visit: Payer: Self-pay | Admitting: Adult Health

## 2022-06-20 ENCOUNTER — Encounter: Payer: Self-pay | Admitting: Adult Health

## 2022-06-21 NOTE — Telephone Encounter (Signed)
Please advise 

## 2022-07-01 ENCOUNTER — Encounter: Payer: Self-pay | Admitting: Adult Health

## 2022-07-02 NOTE — Telephone Encounter (Signed)
Ok for letter

## 2022-07-26 DIAGNOSIS — M25471 Effusion, right ankle: Secondary | ICD-10-CM | POA: Diagnosis not present

## 2022-07-26 DIAGNOSIS — L03011 Cellulitis of right finger: Secondary | ICD-10-CM | POA: Diagnosis not present

## 2022-07-31 ENCOUNTER — Encounter: Payer: Self-pay | Admitting: Adult Health

## 2022-07-31 ENCOUNTER — Ambulatory Visit (INDEPENDENT_AMBULATORY_CARE_PROVIDER_SITE_OTHER): Payer: Medicare Other | Admitting: Adult Health

## 2022-07-31 VITALS — BP 120/60 | HR 67 | Temp 98.2°F | Ht 70.75 in | Wt 169.0 lb

## 2022-07-31 DIAGNOSIS — L03011 Cellulitis of right finger: Secondary | ICD-10-CM | POA: Diagnosis not present

## 2022-07-31 DIAGNOSIS — H6123 Impacted cerumen, bilateral: Secondary | ICD-10-CM | POA: Diagnosis not present

## 2022-07-31 MED ORDER — DICLOFENAC SODIUM 1 % EX GEL: 2.0000 g | Freq: Four times a day (QID) | CUTANEOUS | 2 refills | Status: DC | PRN

## 2022-07-31 NOTE — Progress Notes (Signed)
Subjective:    Patient ID: Michael Johnston, male    DOB: 1932-06-08, 88 y.o.   MRN: [id number]  HPI 88 year old male who  has a past medical history of Arthritis, CHEST PAIN, Chronic bronchitis ([place]), COLONIC POLYPS, HX OF, COPD (chronic obstructive pulmonary disease) ([place]), Coronary artery disease, Cystic kidney disease, GERD (gastroesophageal reflux disease), History of hiatal hernia, MI (myocardial infarction) ([place]) (09/2010), Mixed hyperlipidemia, Skin cancer, Sleep apnea, and Syncope and collapse (~ 2013; 05/24/2015).  He presents to the office today for multiple issues.   He believes that he has a cerumen impaction in both ears. He reports a feeling of fullness and hearing loss bilaterally.  He also reports having a "bump" on his right pinkie finger. He was seen at [facility] urgent care 5 days ago for this issue to the swelling and discomfort to the distal aspect of the right pinky.  He was diagnosed with a paronychia and prescribed Augmentin ID x10 days.  He does report some improvement over the last 5 days since taking the antibiotic but it still continues to be painful.   Review of Systems See HPI   Past Medical History:  Diagnosis Date   Arthritis    "right shoulder" (05/24/2015)   CHEST PAIN    Chronic bronchitis ([place])    "get it ~ q yr" (05/24/2015)   COLONIC POLYPS, HX OF    COPD (chronic obstructive pulmonary disease) ([place])    "dx'd but I don't take RX for it" (05/24/2015)   Coronary artery disease    Cystic kidney disease    GERD (gastroesophageal reflux disease)    History of hiatal hernia    MI (myocardial infarction) ([place]) 09/2010   Mixed hyperlipidemia    Skin cancer    "top of my head only" (05/24/2015)   Sleep apnea    Syncope and collapse ~ 2013; 05/24/2015    Social History   Socioeconomic History   Marital status: Married    Spouse name: Not on file   Number of children: Not on file   Years of education: Not on file   Highest education level: Not on file   Occupational History   Not on file  Tobacco Use   Smoking status: Former    Packs/day: 1.50    Years: 3.00    Total pack years: 4.50    Types: Cigarettes    Quit date: 12/09/1951    Years since quitting: 70.6   Smokeless tobacco: Never  Vaping Use   Vaping Use: Never used  Substance and Sexual Activity   Alcohol use: Not Currently    Comment: "no alcohol since 1953"   Drug use: No   Sexual activity: Not on file  Other Topics Concern   Not on file  Social History Narrative   Retired    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[place] Determinants of [employer] Strain: Low Risk  (10/16/2021)   Overall Financial Resource Strain (CARDIA)    Difficulty of Paying Living Expenses: Not hard at all  Food Insecurity: No Food Insecurity (10/16/2021)   Hunger Vital Sign    Worried About Running Out of Food in the Last Year: Never true    Ran Out of Food in the Last Year: Never true  Transportation Needs: No Transportation Needs (10/16/2021)   PRAPARE - Hydrologist (Medical): No    Lack of Transportation (Non-Medical): No  Physical Activity:  Insufficiently Active (10/16/2021)   Exercise Vital Sign    Days of Exercise per Week: 2 days    Minutes of Exercise per Session: 40 min  Stress: No Stress Concern Present (10/16/2021)   [place]    Feeling of Stress : Not at all  Social Connections: [place] (10/16/2021)   Social Connection and Isolation Panel [NHANES]    Frequency of Communication with Friends and Family: More than three times a week    Frequency of Social Gatherings with Friends and Family: More than three times a week    Attends Religious Services: More than 4 times per year    Active Member of [employer] or Organizations: Yes    Attends Music therapist: More than 4 times per year    Marital Status: Married  Human resources officer Violence: Not At Risk  (10/16/2021)   Humiliation, Afraid, Rape, and Kick questionnaire    Fear of Current or Ex-Partner: No    Emotionally Abused: No    Physically Abused: No    Sexually Abused: No    Past Surgical History:  Procedure Laterality Date   CARDIAC CATHETERIZATION  09/2010   [place]   "partial"   CORONARY ANGIOPLASTY     CORONARY ARTERY BYPASS GRAFT  09/2010   Median sternotomy for coronary artery bypass grafting x3  (left internal mammary artery to distal left anterior  descending  coronary artery, saphenous vein graft to first diagonal branch,  saphenous vein graft to first  obtuse marginal branch, endoscopic  saphenous vein harvest from right thigh). SURGEON:  Valentina Gu.  Roxy Manns, MD  ASSISTANT:  John Giovanni, PA-C  ANESTHESIA:  Glynda Jaeger, MD    LEFT HEART CATH AND CORS/GRAFTS ANGIOGRAPHY N/A 08/22/2017   Procedure: LEFT HEART CATH AND CORS/GRAFTS ANGIOGRAPHY;  Surgeon: Martinique, Peter M, MD;  Location: [place] [facility];  Service: Cardiovascular;  Laterality: N/A;   SKIN CANCER EXCISION     "top of my head"    Family History  Problem Relation Age of Onset   Lung cancer Sister    Heart attack Father     Allergies  Allergen Reactions   Levaquin [Levofloxacin In D5w] Swelling and Other (See Comments)    Eyes swollen   Other Anaphylaxis and Other (See Comments)    Bee sting   Ivp Dye [Iodinated Contrast Media] Other (See Comments)    Patient reports seizure   Metformin And Related Nausea Only    Current Outpatient Medications on File Prior to Visit  Medication Sig Dispense Refill   apixaban (ELIQUIS) 5 MG TABS tablet Take 1 tablet (5 mg total) by mouth 2 (two) times daily. 180 tablet 3   aspirin 81 MG tablet Take 81 mg by mouth daily.     atorvastatin (LIPITOR) 20 MG tablet TAKE 1 TABLET BY MOUTH EVERY DAY (Patient taking differently: Take 20 mg by mouth daily.) 90 tablet 3   cyanocobalamin (,VITAMIN B-12,) 1000 MCG/ML injection INJECT 1ML  INTRAMUSCULARLY EVERY OTHER WEEK (Patient taking differently: Inject 1,000 mcg into the muscle See admin instructions. INJECT 1000 mcg  INTRAMUSCULARLY every other week per patient and family member) 3 mL 6   diclofenac Sodium (VOLTAREN) 1 % GEL Apply 2 g topically 4 (four) times daily as needed. (Patient taking differently: Apply 2 g topically 4 (four) times daily as needed (For pain).) 50 g 0   EPINEPHrine 0.3 mg/0.3 mL  IJ SOAJ injection      ketoconazole (NIZORAL) 2 % shampoo USE TWICE WEEKLY 120 mL 3   metFORMIN (GLUCOPHAGE) 500 MG tablet TAKE 1 TABLET BY MOUTH EVERY DAY WITH BREAKFAST 90 tablet 1   metoprolol tartrate (LOPRESSOR) 25 MG tablet Take 1 tablet (25 mg total) by mouth 2 (two) times daily. 180 tablet 3   nitroGLYCERIN (NITROSTAT) 0.4 MG SL tablet Place 1 tablet (0.4 mg total) under the tongue every 5 (five) minutes as needed for chest pain (3 doses max). 25 tablet 1   SYRINGE-NEEDLE, DISP, 3 ML (B-D 3CC LUER-LOK SYR 25GX1") 25G X 1" 3 ML MISC USE AS DIRECTED TO INJECT B12 3 each 3   No current facility-administered medications on file prior to visit.    BP 120/60   Pulse 67   Temp 98.2 F (36.8 C) (Oral)   Ht 5' 10.75" (1.797 m)   Wt 169 lb (76.7 kg)   SpO2 98%   BMI 23.74 kg/m       Objective:   Physical Exam Vitals and nursing note reviewed.  Constitutional:      Appearance: Normal appearance.  HENT:     Right Ear: There is impacted cerumen.     Left Ear: There is impacted cerumen.  Pulmonary:     Effort: Pulmonary effort is normal.     Breath sounds: Normal breath sounds.  Musculoskeletal:        General: Normal range of motion.     Comments: Mild swelling with no deformity to the pinky finger.  No redness or warmth noted   Skin:    General: Skin is warm and dry.     Capillary Refill: Capillary refill takes less than 2 seconds.  Neurological:     General: No focal deficit present.     Mental Status: He is alert and oriented to person, place, and time.            Assessment & Plan:  1. Bilateral impacted cerumen Verbal consent obtained. Warm water was applied and gentle ear lavage performed bilaterally. There were no complications and following the disimpaction the tympanic membrane were visible bilaterally. Tympanic membranes are intact following the procedure.  Auditory canals are normal.  The patient reported relief of symptoms after removal of cerumen. Patient tolerated procedure well.    2. Paronychia of finger, right - finish abx and if not  resolved then follow up   Dorothyann Peng, NP

## 2022-08-01 ENCOUNTER — Encounter: Payer: Self-pay | Admitting: Cardiovascular Disease

## 2022-08-12 DIAGNOSIS — L0100 Impetigo, unspecified: Secondary | ICD-10-CM | POA: Diagnosis not present

## 2022-08-13 ENCOUNTER — Telehealth: Payer: Self-pay | Admitting: Pharmacist

## 2022-08-13 NOTE — Progress Notes (Signed)
Chronic Care Management Pharmacy Assistant   Name: BRAYON BIELEFELD  MRN: [id number] DOB: 1931-09-13  Reason for Encounter: Disease State General Assessment   Recent office visits:  07/31/22 Dorothyann Peng, NP - Patient presented for Bilateral impacted cerumen and other concerns. No medication changes.  Recent consult visits:  07/26/22 Pollie Friar, FNP  - Patient presented to [facility] Ur[facility] for Hand pain and joint swelling. Prescribed Augmentin.  Hospital visits:  Medication Reconciliation was completed by comparing discharge summary, patient's EMR and Pharmacy list   Patient presented to [place] ED on 02/11/22 for Acute cystitis without hematuria Patient was present for 1 hour.   New?Medications Started at [facility] Discharge:?? -started  Cephalexin 500 mg   Medication Changes at Hospital Discharge: -Changed  none   Medications Discontinued at Hospital Discharge: -Stopped none   Medications that remain the same after Hospital Discharge:??  -All other medications will remain the same.       Patient presented to [facility] on 02/08/22 for Pulmonary embolus. Patient was present for 2 days.   New?Medications Started at [facility] Discharge:?? -started  apixaban Arne Cleveland)   Medication Changes at Hospital Discharge: -Changed  none   Medications Discontinued at Hospital Discharge: -Stopped colchicine 0.6 MG tablet   Medications that remain the same after Hospital Discharge:??  -All other medications will remain the same.        Medications: Outpatient Encounter Medications as of 08/13/2022  Medication Sig   apixaban (ELIQUIS) 5 MG TABS tablet Take 1 tablet (5 mg total) by mouth 2 (two) times daily.   aspirin 81 MG tablet Take 81 mg by mouth daily.   atorvastatin (LIPITOR) 20 MG tablet TAKE 1 TABLET BY MOUTH EVERY DAY (Patient taking differently: Take 20 mg by mouth daily.)   cyanocobalamin (,VITAMIN B-12,) 1000 MCG/ML  injection INJECT 1ML INTRAMUSCULARLY EVERY OTHER WEEK (Patient taking differently: Inject 1,000 mcg into the muscle See admin instructions. INJECT 1000 mcg  INTRAMUSCULARLY every other week per patient and family member)   diclofenac Sodium (VOLTAREN) 1 % GEL Apply 2 g topically 4 (four) times daily as needed (For pain).   EPINEPHrine 0.3 mg/0.3 mL IJ SOAJ injection    ketoconazole (NIZORAL) 2 % shampoo USE TWICE WEEKLY   metFORMIN (GLUCOPHAGE) 500 MG tablet TAKE 1 TABLET BY MOUTH EVERY DAY WITH BREAKFAST   metoprolol tartrate (LOPRESSOR) 25 MG tablet Take 1 tablet (25 mg total) by mouth 2 (two) times daily.   nitroGLYCERIN (NITROSTAT) 0.4 MG SL tablet Place 1 tablet (0.4 mg total) under the tongue every 5 (five) minutes as needed for chest pain (3 doses max).   SYRINGE-NEEDLE, DISP, 3 ML (B-D 3CC LUER-LOK SYR 25GX1") 25G X 1" 3 ML MISC USE AS DIRECTED TO INJECT B12   No facility-administered encounter medications on file as of 08/13/2022.   [place] for General Review Call  Adherence Review:  Does the Clinical Pharmacist Assistant have access to adherence rates? Yes Adherence rates for STAR metric medications   Atorvastatin 20 mg - [facility]atin 20 mg - Last [facility] Metformin 500 mg - Last [facility] Metformin 500 mg - [facility]   Does the patient have >5 day gap between last estimated fill dates for any of the above medications or other medication gaps? No    Disease State Questions:  Able to  connect with Patient? No   Care Gaps: COVID Booster - Overdue Zoster Vaccine - Overdue Eye Exam - Overdue Foot Exam - Overdue HGB A1C - Overdue AWV- 10/16/21  Lab Results  Component Value Date   HGBA1C 6.8 (H) 02/07/2022    Star Rating Drugs: Atorvastatin 20 mg - [facility] Metformin 500 mg - Last filled 05/21/22 90 DS at [place] Pharmacist Assistant [phone]

## 2022-08-18 ENCOUNTER — Other Ambulatory Visit: Payer: Self-pay | Admitting: Adult Health

## 2022-08-18 DIAGNOSIS — E782 Mixed hyperlipidemia: Secondary | ICD-10-CM

## 2022-08-18 DIAGNOSIS — I700 Atherosclerosis of aorta: Secondary | ICD-10-CM

## 2022-08-18 DIAGNOSIS — I2510 Atherosclerotic heart disease of native coronary artery without angina pectoris: Secondary | ICD-10-CM

## 2022-08-18 DIAGNOSIS — I10 Essential (primary) hypertension: Secondary | ICD-10-CM

## 2022-08-18 DIAGNOSIS — E1149 Type 2 diabetes mellitus with other diabetic neurological complication: Secondary | ICD-10-CM

## 2022-08-21 ENCOUNTER — Ambulatory Visit (INDEPENDENT_AMBULATORY_CARE_PROVIDER_SITE_OTHER): Payer: Medicare Other | Admitting: Adult Health

## 2022-08-21 ENCOUNTER — Encounter: Payer: Self-pay | Admitting: Adult Health

## 2022-08-21 VITALS — BP 120/60 | HR 65 | Temp 97.5°F | Ht 69.75 in | Wt 172.0 lb

## 2022-08-21 DIAGNOSIS — E1149 Type 2 diabetes mellitus with other diabetic neurological complication: Secondary | ICD-10-CM

## 2022-08-21 DIAGNOSIS — I2699 Other pulmonary embolism without acute cor pulmonale: Secondary | ICD-10-CM | POA: Diagnosis not present

## 2022-08-21 DIAGNOSIS — I10 Essential (primary) hypertension: Secondary | ICD-10-CM | POA: Diagnosis not present

## 2022-08-21 DIAGNOSIS — I2510 Atherosclerotic heart disease of native coronary artery without angina pectoris: Secondary | ICD-10-CM | POA: Diagnosis not present

## 2022-08-21 DIAGNOSIS — E782 Mixed hyperlipidemia: Secondary | ICD-10-CM | POA: Diagnosis not present

## 2022-08-21 DIAGNOSIS — E538 Deficiency of other specified B group vitamins: Secondary | ICD-10-CM

## 2022-08-21 DIAGNOSIS — Z0000 Encounter for general adult medical examination without abnormal findings: Secondary | ICD-10-CM

## 2022-08-21 LAB — LIPID PANEL
Cholesterol: 135 mg/dL (ref 0–200)
HDL: 70.6 mg/dL (ref >39.00)
LDL Cholesterol: 51 mg/dL (ref 0–99)
NonHDL: 64.06
Total CHOL/HDL Ratio: 2
Triglycerides: 64 mg/dL (ref 0.0–149.0)
VLDL: 12.8 mg/dL (ref 0.0–40.0)

## 2022-08-21 LAB — CBC WITH DIFFERENTIAL/PLATELET
Basophils Absolute: 0.1 10*3/uL (ref 0.0–0.1)
Basophils Relative: 1.1 % (ref 0.0–3.0)
Eosinophils Absolute: 0.1 10*3/uL (ref 0.0–0.7)
Eosinophils Relative: 2.7 % (ref 0.0–5.0)
HCT: 39.2 % (ref 39.0–52.0)
Hemoglobin: 13.3 g/dL (ref 13.0–17.0)
Lymphocytes Relative: 34.1 % (ref 12.0–46.0)
Lymphs Abs: 1.8 10*3/uL (ref 0.7–4.0)
MCHC: 33.8 g/dL (ref 30.0–36.0)
MCV: 97 fl (ref 78.0–100.0)
Monocytes Absolute: 0.4 10*3/uL (ref 0.1–1.0)
Monocytes Relative: 8.3 % (ref 3.0–12.0)
Neutro Abs: 2.9 10*3/uL (ref 1.4–7.7)
Neutrophils Relative %: 53.8 % (ref 43.0–77.0)
Platelets: 184 10*3/uL (ref 150.0–400.0)
RBC: 4.04 Mil/uL — ABNORMAL LOW (ref 4.22–5.81)
RDW: 14.1 % (ref 11.5–15.5)
WBC: 5.4 10*3/uL (ref 4.0–10.5)

## 2022-08-21 LAB — COMPREHENSIVE METABOLIC PANEL
ALT: 17 U/L (ref 0–53)
AST: 20 U/L (ref 0–37)
Albumin: 4.2 g/dL (ref 3.5–5.2)
Alkaline Phosphatase: 58 U/L (ref 39–117)
BUN: 22 mg/dL (ref 6–23)
CO2: 31 mEq/L (ref 19–32)
Calcium: 9.5 mg/dL (ref 8.4–10.5)
Chloride: 103 mEq/L (ref 96–112)
Creatinine, Ser: 0.83 mg/dL (ref 0.40–1.50)
GFR: 78.34 mL/min (ref >60.00)
Glucose, Bld: 128 mg/dL — ABNORMAL HIGH (ref 70–99)
Potassium: 4.6 mEq/L (ref 3.5–5.1)
Sodium: 140 mEq/L (ref 135–145)
Total Bilirubin: 0.8 mg/dL (ref 0.2–1.2)
Total Protein: 6.8 g/dL (ref 6.0–8.3)

## 2022-08-21 LAB — VITAMIN B12: Vitamin B-12: 866 pg/mL (ref 211–911)

## 2022-08-21 LAB — TSH: TSH: 4.56 u[IU]/mL (ref 0.35–5.50)

## 2022-08-21 LAB — HEMOGLOBIN A1C: Hgb A1c MFr Bld: 6.9 % — ABNORMAL HIGH (ref 4.6–6.5)

## 2022-08-21 LAB — MICROALBUMIN / CREATININE URINE RATIO
Creatinine,U: 75.8 mg/dL
Microalb Creat Ratio: 0.9 mg/g (ref 0.0–30.0)
Microalb, Ur: <0.7 mg/dL (ref 0.0–1.9)

## 2022-08-21 NOTE — Progress Notes (Signed)
Subjective:    Patient ID: Michael Johnston, male    DOB: 01-Aug-1932, 88 y.o.   MRN: [id number]  HPI Patient presents for yearly preventative medicine examination. He is a pleasant 88 year old male who  has a past medical history of Arthritis, CHEST PAIN, Chronic bronchitis ([place]), COLONIC POLYPS, HX OF, COPD (chronic obstructive pulmonary disease) ([place]), Coronary artery disease, Cystic kidney disease, GERD (gastroesophageal reflux disease), History of hiatal hernia, MI (myocardial infarction) ([place]) (09/2010), Mixed hyperlipidemia, Skin cancer, Sleep apnea, and Syncope and collapse (~ 2013; 05/24/2015).  Diabetes mellitus type 2-managed with metformin 500 mg daily.  He does not check his blood pressure at home on a routine basis but denies episodes of hypoglycemia.  He denies nausea, vomiting, diarrhea. Lab Results  Component Value Date   HGBA1C 6.8 (H) 02/07/2022   Hypertension-managed with metoprolol 25 mg twice daily.  He denies dizziness, lightheadedness, chest pain, or shortness of breath. BP Readings from Last 3 Encounters:  08/21/22 120/60  07/31/22 120/60  03/04/22 135/71   Hyperlipidemia-managed with Lipitor 20 mg daily and aspirin 81 mg.  He denies myalgia or fatigue Lab Results  Component Value Date   CHOL 149 07/17/2021   HDL 64.70 07/17/2021   LDLCALC 70 07/17/2021   TRIG 71.0 07/17/2021   CHOLHDL 2 07/17/2021   CAD status post CABG-managed by cardiology.  Prescribed Lipitor 20 mg daily and aspirin 81 mg.  He has not had any chest pain, palpitations, shortness of breath, claudication symptoms or DOE.  B12 deficiency-receives monthly B12 injections.  H/o PE -in 02/2022 -bilateral knees with subsegmental pulmonary emboli in the right upper and lower lobe with a small clot burden.  He was treated with IV heparin while hospitalized and transition to oral Eliquis at discharge.  He denies any issues with bleeding.  Currently continues on Eliquis5 twice daily.  Has a follow-up  appointment at the beginning of next month with Hematology   All immunizations and health maintenance protocols were reviewed with the patient and needed orders were placed.  Appropriate screening laboratory values were ordered for the patient including screening of hyperlipidemia, renal function and hepatic function.  Medication reconciliation,  past medical history, social history, problem list and allergies were reviewed in detail with the patient  Goals were established with regard to weight loss, exercise, and  diet in compliance with medications   Review of Systems  Constitutional: Negative.   HENT: Negative.    Eyes: Negative.   Respiratory: Negative.    Cardiovascular: Negative.   Gastrointestinal:  Positive for constipation (chronic).  Endocrine: Negative.   Genitourinary: Negative.   Musculoskeletal:  Positive for arthralgias (chronic) and back pain (chronic).  Skin: Negative.   Allergic/Immunologic: Negative.   Neurological: Negative.   Hematological: Negative.   Psychiatric/Behavioral: Negative.    All other systems reviewed and are negative.  Past Medical History:  Diagnosis Date   Arthritis    "right shoulder" (05/24/2015)   CHEST PAIN    Chronic bronchitis ([place])    "get it ~ q yr" (05/24/2015)   COLONIC POLYPS, HX OF    COPD (chronic obstructive pulmonary disease) ([place])    "dx'd but I don't take RX for it" (05/24/2015)   Coronary artery disease    Cystic kidney disease    GERD (gastroesophageal reflux disease)    History of hiatal hernia    MI (myocardial infarction) ([place]) 09/2010   Mixed hyperlipidemia    Skin cancer    "top of my head  only" (05/24/2015)   Sleep apnea    Syncope and collapse ~ 2013; 05/24/2015    Social History   Socioeconomic History   Marital status: Married    Spouse name: Not on file   Number of children: Not on file   Years of education: Not on file   Highest education level: Not on file  Occupational History   Not on file   Tobacco Use   Smoking status: Former    Packs/day: 1.50    Years: 3.00    Total pack years: 4.50    Types: Cigarettes    Quit date: 12/09/1951    Years since quitting: 70.7   Smokeless tobacco: Never  Vaping Use   Vaping Use: Never used  Substance and Sexual Activity   Alcohol use: Not Currently    Comment: "no alcohol since 1953"   Drug use: No   Sexual activity: Not on file  Other Topics Concern   Not on file  Social History Narrative   Retired    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[place] Strain: [place]  (10/16/2021)   Overall Financial Resource Strain (CARDIA)    Difficulty of Paying Living Expenses: Not hard at all  Food Insecurity: No Food Insecurity (10/16/2021)   Hunger Vital Sign    Worried About Running Out of Food in the Last Year: Never true    [place] in the Last Year: Never true  Transportation Needs: No Transportation Needs (10/16/2021)   PRAPARE - Hydrologist (Medical): No    Lack of Transportation (Non-Medical): No  Physical Activity: Insufficiently Active (10/16/2021)   Exercise Vital Sign    Days of Exercise per Week: 2 days    Minutes of Exercise per Session: 40 min  Stress: No Stress Concern Present (10/16/2021)   [place]    Feeling of Stress : Not at all  Social Connections: [place] (10/16/2021)   Social Connection and Isolation Panel [NHANES]    Frequency of Communication with Friends and Family: More than three times a week    Frequency of Social Gatherings with Friends and Family: More than three times a week    Attends Religious Services: More than 4 times per year    Active Member of [employer] or Organizations: Yes    Attends Music therapist: More than 4 times per year    Marital Status: Married  Human resources officer Violence: Not At Risk (10/16/2021)   Humiliation, Afraid, Rape, and  Kick questionnaire    Fear of Current or Ex-Partner: No    Emotionally Abused: No    Physically Abused: No    Sexually Abused: No    Past Surgical History:  Procedure Laterality Date   CARDIAC CATHETERIZATION  09/2010   [place]   "partial"   CORONARY ANGIOPLASTY     CORONARY ARTERY BYPASS GRAFT  09/2010   Median sternotomy for coronary artery bypass grafting x3  (left internal mammary artery to distal left anterior  descending  coronary artery, saphenous vein graft to first diagonal branch,  saphenous vein graft to first  obtuse marginal branch, endoscopic  saphenous vein harvest from right thigh). SURGEON:  Valentina Gu.  Roxy Manns, MD  ASSISTANT:  John Giovanni, PA-C  ANESTHESIA:  Glynda Jaeger, MD    LEFT HEART CATH  AND CORS/GRAFTS ANGIOGRAPHY N/A 08/22/2017   Procedure: LEFT HEART CATH AND CORS/GRAFTS ANGIOGRAPHY;  Surgeon: Martinique, Peter M, MD;  Location: [facility];  Service: Cardiovascular;  Laterality: N/A;   SKIN CANCER EXCISION     "top of my head"    Family History  Problem Relation Age of Onset   Lung cancer Sister    Heart attack Father     Allergies  Allergen Reactions   Levaquin [Levofloxacin In D5w] Swelling and Other (See Comments)    Eyes swollen   Other Anaphylaxis and Other (See Comments)    Bee sting   Ivp Dye [Iodinated Contrast Media] Other (See Comments)    Patient reports seizure   Metformin And Related Nausea Only    Current Outpatient Medications on File Prior to Visit  Medication Sig Dispense Refill   apixaban (ELIQUIS) 5 MG TABS tablet Take 1 tablet (5 mg total) by mouth 2 (two) times daily. 180 tablet 3   aspirin 81 MG tablet Take 81 mg by mouth daily.     atorvastatin (LIPITOR) 20 MG tablet TAKE 1 TABLET BY MOUTH EVERY DAY 90 tablet 3   cyanocobalamin (,VITAMIN B-12,) 1000 MCG/ML injection INJECT 1ML INTRAMUSCULARLY EVERY OTHER WEEK (Patient taking differently: Inject 1,000 mcg into the muscle See admin  instructions. INJECT 1000 mcg  INTRAMUSCULARLY every other week per patient and family member) 3 mL 6   diclofenac Sodium (VOLTAREN) 1 % GEL Apply 2 g topically 4 (four) times daily as needed (For pain). 150 g 2   EPINEPHrine 0.3 mg/0.3 mL IJ SOAJ injection      ketoconazole (NIZORAL) 2 % shampoo USE TWICE WEEKLY 120 mL 3   metFORMIN (GLUCOPHAGE) 500 MG tablet TAKE 1 TABLET BY MOUTH EVERY DAY WITH BREAKFAST 90 tablet 1   metoprolol tartrate (LOPRESSOR) 25 MG tablet TAKE 1 TABLET BY MOUTH TWICE A DAY 180 tablet 3   nitroGLYCERIN (NITROSTAT) 0.4 MG SL tablet Place 1 tablet (0.4 mg total) under the tongue every 5 (five) minutes as needed for chest pain (3 doses max). 25 tablet 1   SYRINGE-NEEDLE, DISP, 3 ML (B-D 3CC LUER-LOK SYR 25GX1") 25G X 1" 3 ML MISC USE AS DIRECTED TO INJECT B12 3 each 3   No current facility-administered medications on file prior to visit.    BP 120/60   Pulse 65   Temp (!) 97.5 F (36.4 C) (Oral)   Ht 5' 9.75" (1.772 m)   Wt 172 lb (78 kg)   SpO2 98%   BMI 24.86 kg/m       Objective:   Physical Exam Vitals and nursing note reviewed.  Constitutional:      General: He is not in acute distress.    Appearance: Normal appearance. He is well-developed and normal weight.  HENT:     Head: Normocephalic and atraumatic.     Right Ear: Tympanic membrane, ear canal and external ear normal. There is no impacted cerumen.     Left Ear: Tympanic membrane, ear canal and external ear normal. There is no impacted cerumen.     Nose: Nose normal. No congestion or rhinorrhea.     Mouth/Throat:     Mouth: Mucous membranes are moist.     Pharynx: Oropharynx is clear. No oropharyngeal exudate or posterior oropharyngeal erythema.  Eyes:     General:        Right eye: No discharge.        Left eye: No discharge.     Extraocular  Movements: Extraocular movements intact.     Conjunctiva/sclera: Conjunctivae normal.     Pupils: Pupils are equal, round, and reactive to light.   Neck:     Vascular: No carotid bruit.     Trachea: No tracheal deviation.  Cardiovascular:     Rate and Rhythm: Normal rate and regular rhythm.     Pulses: Normal pulses.     Heart sounds: Normal heart sounds. No murmur heard.    No friction rub. No gallop.  Pulmonary:     Effort: Pulmonary effort is normal. No respiratory distress.     Breath sounds: Normal breath sounds. No stridor. No wheezing, rhonchi or rales.  Chest:     Chest wall: No tenderness.  Abdominal:     General: Bowel sounds are normal. There is no distension.     Palpations: Abdomen is soft. There is no mass.     Tenderness: There is no abdominal tenderness. There is no right CVA tenderness, left CVA tenderness, guarding or rebound.     Hernia: No hernia is present.  Musculoskeletal:        General: No swelling, tenderness, deformity or signs of injury. Normal range of motion.     Right lower leg: No edema.     Left lower leg: No edema.  Lymphadenopathy:     Cervical: No cervical adenopathy.  Skin:    General: Skin is warm and dry.     Capillary Refill: Capillary refill takes less than 2 seconds.     Coloration: Skin is not jaundiced or pale.     Findings: No bruising, erythema, lesion or rash.  Neurological:     General: No focal deficit present.     Mental Status: He is alert and oriented to person, place, and time.     Cranial Nerves: No cranial nerve deficit.     Sensory: No sensory deficit.     Motor: No weakness.     Coordination: Coordination normal.     Gait: Gait normal.     Deep Tendon Reflexes: Reflexes normal.  Psychiatric:        Mood and Affect: Mood normal.        Behavior: Behavior normal.        Thought Content: Thought content normal.        Judgment: Judgment normal.       Assessment & Plan:  1. Routine general medical examination at a health care facility - Follow up in one year or sooner if needed   2. Type 2 diabetes mellitus with neurological complications (HCC) - Continue  with Metformin 500 mg daily.  - Follow up in 6 months  - CBC with Differential/Platelet; Future - Comprehensive metabolic panel; Future - Hemoglobin A1c; Future - Lipid panel; Future - TSH; Future - Microalbumin/Creatinine Ratio, Urine; Future  3. Essential hypertension - Well controlled. No change in medications  - CBC with Differential/Platelet; Future - Comprehensive metabolic panel; Future - Hemoglobin A1c; Future - Lipid panel; Future - TSH; Future - Microalbumin/Creatinine Ratio, Urine; Future  4. B12 deficiency  - Vitamin B12; Future  5. Mixed hyperlipidemia - Continue with statin  - CBC with Differential/Platelet; Future - Comprehensive metabolic panel; Future - Hemoglobin A1c; Future - Lipid panel; Future - TSH; Future  6. Coronary artery disease involving native coronary artery of native heart without angina pectoris - Continue with statin  - CBC with Differential/Platelet; Future - Comprehensive metabolic panel; Future - Hemoglobin A1c; Future - Lipid panel; Future - TSH; Future  7. Acute pulmonary embolism without acute cor pulmonale, unspecified pulmonary embolism type ([place]) - Continue with Eliquis  - Follow up with Hematology as directed  - CBC with Differential/Platelet; Future - Comprehensive metabolic panel; Future - Hemoglobin A1c; Future - Lipid panel; Future - TSH; Future  Dorothyann Peng, NP

## 2022-08-21 NOTE — Patient Instructions (Addendum)
It was great seeing you today   We will follow up with you regarding your lab work   Please let me know if you need anything   

## 2022-08-22 DIAGNOSIS — L570 Actinic keratosis: Secondary | ICD-10-CM | POA: Diagnosis not present

## 2022-08-22 DIAGNOSIS — L309 Dermatitis, unspecified: Secondary | ICD-10-CM | POA: Diagnosis not present

## 2022-08-27 ENCOUNTER — Other Ambulatory Visit: Payer: Self-pay | Admitting: Adult Health

## 2022-08-27 DIAGNOSIS — E538 Deficiency of other specified B group vitamins: Secondary | ICD-10-CM

## 2022-09-05 ENCOUNTER — Telehealth: Payer: Self-pay | Admitting: Adult Health

## 2022-09-05 NOTE — Telephone Encounter (Signed)
Please advise 

## 2022-09-05 NOTE — Telephone Encounter (Signed)
Pt is calling and would like to have more visit for PT for pinch nerve . [facility] [place]

## 2022-09-05 NOTE — Telephone Encounter (Signed)
Please schedule pt if call is returned

## 2022-09-05 NOTE — Telephone Encounter (Signed)
Left message to return phone call.

## 2022-09-06 NOTE — Telephone Encounter (Signed)
Pt will be here on Wed 09/11/22.

## 2022-09-10 NOTE — Telephone Encounter (Signed)
Noted  

## 2022-09-11 ENCOUNTER — Encounter: Payer: Self-pay | Admitting: Adult Health

## 2022-09-11 ENCOUNTER — Other Ambulatory Visit (HOSPITAL_COMMUNITY): Payer: Medicare Other

## 2022-09-11 ENCOUNTER — Ambulatory Visit (INDEPENDENT_AMBULATORY_CARE_PROVIDER_SITE_OTHER): Payer: Medicare Other | Admitting: Adult Health

## 2022-09-11 VITALS — BP 110/68 | HR 64 | Temp 97.9°F | Ht 69.75 in | Wt 173.0 lb

## 2022-09-11 DIAGNOSIS — G8929 Other chronic pain: Secondary | ICD-10-CM | POA: Diagnosis not present

## 2022-09-11 DIAGNOSIS — M545 Low back pain, unspecified: Secondary | ICD-10-CM

## 2022-09-11 DIAGNOSIS — R2681 Unsteadiness on feet: Secondary | ICD-10-CM | POA: Diagnosis not present

## 2022-09-11 NOTE — Patient Instructions (Signed)
I have referred you to PT in [place] - they will call you to schedule your appointments

## 2022-09-11 NOTE — Progress Notes (Signed)
Subjective:    Patient ID: Michael Johnston, male    DOB: 07/20/1932, 89 y.o.   MRN: [id number]  HPI 89 year old male who  has a past medical history of Arthritis, CHEST PAIN, Chronic bronchitis ([facility]), COLONIC POLYPS, HX OF, COPD (chronic obstructive pulmonary disease) ([place]), Coronary artery disease, Cystic kidney disease, GERD (gastroesophageal reflux disease), History of hiatal hernia, MI (myocardial infarction) ([place]) (09/2010), Mixed hyperlipidemia, Skin cancer, Sleep apnea, and Syncope and collapse (~ 2013; 05/24/2015).  He presents to the office today for an acute on chronic issue. He has chronic back pain with sciatica ( intermittently) . He has gone to PT last year and found relief with this he would like to go back. He also wants help " getting my legs strong"   He denies any falls   Review of Systems See HPI   Past Medical History:  Diagnosis Date   Arthritis    "right shoulder" (05/24/2015)   CHEST PAIN    Chronic bronchitis ([place])    "get it ~ q yr" (05/24/2015)   COLONIC POLYPS, HX OF    COPD (chronic obstructive pulmonary disease) ([place])    "dx'd but I don't take RX for it" (05/24/2015)   Coronary artery disease    Cystic kidney disease    GERD (gastroesophageal reflux disease)    History of hiatal hernia    MI (myocardial infarction) ([place]) 09/2010   Mixed hyperlipidemia    Skin cancer    "top of my head only" (05/24/2015)   Sleep apnea    Syncope and collapse ~ 2013; 05/24/2015    Social History   Socioeconomic History   Marital status: Married    Spouse name: Not on file   Number of children: Not on file   Years of education: Not on file   Highest education level: Not on file  Occupational History   Not on file  Tobacco Use   Smoking status: Former    Packs/day: 1.50    Years: 3.00    Total pack years: 4.50    Types: Cigarettes    Quit date: 12/09/1951    Years since quitting: 70.8   Smokeless tobacco: Never  Vaping Use   Vaping Use: Never used   Substance and Sexual Activity   Alcohol use: Not Currently    Comment: "no alcohol since 1953"   Drug use: No   Sexual activity: Not on file  Other Topics Concern   Not on file  Social History Narrative   Retired    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 of [employer] Strain: Low Risk  (10/16/2021)   Overall Financial Resource Strain (CARDIA)    Difficulty of Paying Living Expenses: Not hard at all  Food Insecurity: No Food Insecurity (10/16/2021)   Hunger Vital Sign    Worried About Running Out of Food in the Last Year: Never true    Ran Out of Food in the Last Year: Never true  Transportation Needs: No Transportation Needs (10/16/2021)   PRAPARE - Hydrologist (Medical): No    Lack of Transportation (Non-Medical): No  Physical Activity: Insufficiently Active (10/16/2021)   Exercise Vital Sign    Days of Exercise per Week: 2 days    Minutes of Exercise per Session: 40 min  Stress: No Stress Concern Present (10/16/2021)   [place]  of Stress : Not at all  Social Connections: Socially [place] (10/16/2021)   Social Connection and Isolation Panel [NHANES]    Frequency of Communication with Friends and Family: More than three times a week    Frequency of Social Gatherings with Friends and Family: More than three times a week    Attends Religious Services: More than 4 times per year    Active Member of [employer] or Organizations: Yes    Attends Music therapist: More than 4 times per year    Marital Status: Married  Human resources officer Violence: Not At Risk (10/16/2021)   Humiliation, Afraid, Rape, and Kick questionnaire    Fear of Current or Ex-Partner: No    Emotionally Abused: No    Physically Abused: No    Sexually Abused: No    Past Surgical History:  Procedure Laterality Date   CARDIAC CATHETERIZATION  09/2010   [place]   "partial"   CORONARY ANGIOPLASTY     CORONARY ARTERY BYPASS GRAFT  09/2010   Median sternotomy for coronary artery bypass grafting x3  (left internal mammary artery to distal left anterior  descending  coronary artery, saphenous vein graft to first diagonal branch,  saphenous vein graft to first  obtuse marginal branch, endoscopic  saphenous vein harvest from right thigh). SURGEON:  Valentina Gu.  Roxy Manns, MD  ASSISTANT:  John Giovanni, PA-C  ANESTHESIA:  Glynda Jaeger, MD    LEFT HEART CATH AND CORS/GRAFTS ANGIOGRAPHY N/A 08/22/2017   Procedure: LEFT HEART CATH AND CORS/GRAFTS ANGIOGRAPHY;  Surgeon: Martinique, Peter M, MD;  Location: [facility];  Service: Cardiovascular;  Laterality: N/A;   SKIN CANCER EXCISION     "top of my head"    Family History  Problem Relation Age of Onset   Lung cancer Sister    Heart attack Father     Allergies  Allergen Reactions   Levaquin [Levofloxacin In D5w] Swelling and Other (See Comments)    Eyes swollen   Other Anaphylaxis and Other (See Comments)    Bee sting   Ivp Dye [Iodinated Contrast Media] Other (See Comments)    Patient reports seizure   Metformin And Related Nausea Only    Current Outpatient Medications on File Prior to Visit  Medication Sig Dispense Refill   apixaban (ELIQUIS) 5 MG TABS tablet Take 1 tablet (5 mg total) by mouth 2 (two) times daily. 180 tablet 3   aspirin 81 MG tablet Take 81 mg by mouth daily.     atorvastatin (LIPITOR) 20 MG tablet TAKE 1 TABLET BY MOUTH EVERY DAY 90 tablet 3   cyanocobalamin (VITAMIN B12) 1000 MCG/ML injection INJECT 1 ML INTRAMUSCULARLY EVERY OTHER WEEK 3 mL 6   diclofenac Sodium (VOLTAREN) 1 % GEL Apply 2 g topically 4 (four) times daily as needed (For pain). 150 g 2   EPINEPHrine 0.3 mg/0.3 mL IJ SOAJ injection      ketoconazole (NIZORAL) 2 % shampoo USE TWICE WEEKLY 120 mL 3   metFORMIN (GLUCOPHAGE) 500 MG tablet TAKE 1 TABLET BY MOUTH EVERY DAY WITH BREAKFAST  90 tablet 1   metoprolol tartrate (LOPRESSOR) 25 MG tablet TAKE 1 TABLET BY MOUTH TWICE A DAY 180 tablet 3   nitroGLYCERIN (NITROSTAT) 0.4 MG SL tablet Place 1 tablet (0.4 mg total) under the tongue every 5 (five) minutes as needed for chest pain (3 doses max). 25 tablet 1   SYRINGE-NEEDLE, DISP, 3 ML (B-D  3CC LUER-LOK SYR 25GX1") 25G X 1" 3 ML MISC USE AS DIRECTED TO INJECT B12 3 each 3   No current facility-administered medications on file prior to visit.    BP 110/68   Pulse 64   Temp 97.9 F (36.6 C) (Oral)   Ht 5' 9.75" (1.772 m)   Wt 173 lb (78.5 kg)   SpO2 96%   BMI 25.00 kg/m       Objective:   Physical Exam Vitals and nursing note reviewed.  Constitutional:      Appearance: Normal appearance.  Cardiovascular:     Rate and Rhythm: Normal rate and regular rhythm.     Pulses: Normal pulses.     Heart sounds: Normal heart sounds.  Pulmonary:     Effort: Pulmonary effort is normal.     Breath sounds: Normal breath sounds.  Skin:    General: Skin is warm and dry.     Capillary Refill: Capillary refill takes less than 2 seconds.  Neurological:     General: No focal deficit present.     Mental Status: He is alert and oriented to person, place, and time.     Gait: Gait abnormal.  Psychiatric:        Mood and Affect: Mood normal.        Behavior: Behavior normal.        Thought Content: Thought content normal.        Judgment: Judgment normal.        Assessment & Plan:  1. Chronic midline low back pain without sciatica  - Ambulatory referral to Physical Therapy

## 2022-09-16 ENCOUNTER — Ambulatory Visit: Payer: Medicare Other | Attending: Adult Health

## 2022-09-16 ENCOUNTER — Other Ambulatory Visit: Payer: Self-pay

## 2022-09-16 DIAGNOSIS — M5459 Other low back pain: Secondary | ICD-10-CM | POA: Diagnosis not present

## 2022-09-16 DIAGNOSIS — G8929 Other chronic pain: Secondary | ICD-10-CM | POA: Diagnosis not present

## 2022-09-16 DIAGNOSIS — M545 Low back pain, unspecified: Secondary | ICD-10-CM | POA: Insufficient documentation

## 2022-09-16 DIAGNOSIS — M6281 Muscle weakness (generalized): Secondary | ICD-10-CM

## 2022-09-16 DIAGNOSIS — R2681 Unsteadiness on feet: Secondary | ICD-10-CM

## 2022-09-16 NOTE — Therapy (Signed)
OUTPATIENT PHYSICAL THERAPY THORACOLUMBAR EVALUATION   Patient Name: Michael Johnston MRN: [id number] DOB:02-Jun-1932, 89 y.o., male Today's Date: 09/16/2022  END OF SESSION:  PT End of Session - 09/16/22 0949     Visit Number 1    Number of Visits 8    Date for PT Re-Evaluation 12/06/22    PT Start Time 0954    PT Stop Time 1029    PT Time Calculation (min) 35 min    Activity Tolerance Patient tolerated treatment well    Behavior During Therapy [facility] for tasks assessed/performed             Past Medical History:  Diagnosis Date   Arthritis    "right shoulder" (05/24/2015)   CHEST PAIN    Chronic bronchitis ([place])    "get it ~ q yr" (05/24/2015)   COLONIC POLYPS, HX OF    COPD (chronic obstructive pulmonary disease) ([place])    "dx'd but I don't take RX for it" (05/24/2015)   Coronary artery disease    Cystic kidney disease    GERD (gastroesophageal reflux disease)    History of hiatal hernia    MI (myocardial infarction) ([place]) 09/2010   Mixed hyperlipidemia    Skin cancer    "top of my head only" (05/24/2015)   Sleep apnea    Syncope and collapse ~ 2013; 05/24/2015   Past Surgical History:  Procedure Laterality Date   CARDIAC CATHETERIZATION  09/2010   [place]   "partial"   CORONARY ANGIOPLASTY     CORONARY ARTERY BYPASS GRAFT  09/2010   Median sternotomy for coronary artery bypass grafting x3  (left internal mammary artery to distal left anterior  descending  coronary artery, saphenous vein graft to first diagonal branch,  saphenous vein graft to first  obtuse marginal branch, endoscopic  saphenous vein harvest from right thigh). SURGEON:  Valentina Gu.  Roxy Manns, MD  ASSISTANT:  John Giovanni, PA-C  ANESTHESIA:  Glynda Jaeger, MD    LEFT HEART CATH AND CORS/GRAFTS ANGIOGRAPHY N/A 08/22/2017   Procedure: LEFT HEART CATH AND CORS/GRAFTS ANGIOGRAPHY;  Surgeon: Martinique, Peter M, MD;  Location: [facility];  Service: Cardiovascular;   Laterality: N/A;   SKIN CANCER EXCISION     "top of my head"   Patient Active Problem List   Diagnosis Date Noted   Pulmonary embolus ([place]) 02/08/2022   Combined forms of age-related cataract of left eye 11/10/2018   Combined forms of age-related cataract of right eye 11/03/2018   Intraoperative floppy iris syndrome (IFIS) 11/03/2018   Urine stream spraying 02/23/2018   Flank pain 02/22/2018   Chest pain 08/21/2017   Unstable angina ([place]) 08/21/2017   Renal cyst 12/20/2016   B12 deficiency 11/08/2016   Type 2 diabetes mellitus with neurological complications ([place]) 78/46/9629   Syncope and collapse 05/24/2015   Benign prostatic hyperplasia with urinary obstruction 12/12/2014   Carotid artery stenosis 05/22/2012   CAD (coronary artery disease) 05/14/2012   ED (erectile dysfunction) of organic origin 08/19/2011   FH: CABG (coronary artery bypass surgery) 01/28/2011   Mixed hyperlipidemia 10/23/2010   GERD (gastroesophageal reflux disease) 10/22/2010   History of colonic polyps 06/14/2010   REFERRING PROVIDER: Dorothyann Peng, NP   REFERRING DIAG: Chronic midline low back pain without sciatica   Rationale for Evaluation and Treatment: Rehabilitation  THERAPY DIAG:  Unsteadiness on feet  Muscle weakness (generalized)  Other low back pain  ONSET DATE: unsure  SUBJECTIVE:                                                                                                                                                                                           SUBJECTIVE STATEMENT: Patient reports that his back mainly bothers him when he tries to stand up straight while walking. He is unable to recall when this started, but he was told that he has multiple pinched nerves in his back.   PERTINENT HISTORY:  Allergies, angina, diabetes, visual impairments, hard of hearing, history of myocardial infarction, arthritis, and history of cancer  PAIN:  Are you having pain? Yes: NPRS  scale: no pain score provided/10 Pain location: "down my back" Pain description: "weakness" Aggravating factors: walking, stand up straight  Relieving factors: bending over  PRECAUTIONS: Fall  WEIGHT BEARING RESTRICTIONS: No  FALLS:  Has patient fallen in last 6 months? No  LIVING ENVIRONMENT: Lives with: lives with their spouse Lives in: House/apartment Stairs: Yes: External: 4 steps; can reach both; step to pattern  Has following equipment at home: Single point cane  OCCUPATION: retired  PLOF: Independent  PATIENT GOALS: improved strength, stand and walk longer (<5 minutes currently), and be able to stand upright while walking  NEXT MD VISIT: unsure  OBJECTIVE:  SCREENING FOR RED FLAGS: Bowel or bladder incontinence: No Spinal tumors: No Cauda equina syndrome: No Compression fracture: No Abdominal aneurysm: No  COGNITION: Overall cognitive status: Within functional limits for tasks assessed     SENSATION: Patient reports no numbness or tingling  POSTURE: rounded shoulders, forward head, increased thoracic kyphosis, and flexed trunk   LUMBAR ROM:   AROM eval  Flexion Not assessed due to safety; stands at 14 degrees of lumbar flexion  Extension 2  Right lateral flexion   Left lateral flexion   Right rotation 50% limited required modA due to LOB  Left rotation 75% limited; required maxA due to LOB   (Blank rows = not tested)  LOWER EXTREMITY ROM: WFL for activities assessed  LOWER EXTREMITY MMT:    MMT Right eval Left eval  Hip flexion 4/5 4/5  Hip extension    Hip abduction    Hip adduction    Hip internal rotation    Hip external rotation    Knee flexion 4-/5 4-/5  Knee extension 5/5 4/5  Ankle dorsiflexion 3/5 3/5  Ankle plantarflexion    Ankle inversion    Ankle eversion     (Blank rows = not tested)  FUNCTIONAL TESTS:  5 times sit to stand: 16.51 seconds w/o UE support; required cueing throughout for upright posture Timed up and go  (  TUG): 19.91 seconds w/o assistive device; required close supervision Dynamic Gait Index: 4/25 Sit to stand transfers: required close supervision Stand to sit transfers: required close supervision with uncontrolled descent  GAIT: Assistive device utilized: None Level of assistance: Complete Independence Comments: shuffling pattern; required close supervision up to maxA with ambulation for safety to prevent a fall  TODAY'S TREATMENT:                                                                                                                              DATE:     PATIENT EDUCATION:  Education details: POC, goals for therapy, safety Person educated: Patient Education method: Explanation Education comprehension: verbalized understanding  HOME EXERCISE PROGRAM:   ASSESSMENT:  CLINICAL IMPRESSION: Patient is a 89 y.o. male who was seen today for physical therapy evaluation and treatment for chronic low back pain.  He presented with low pain severity and irritability as none of today's assessments significantly reproduced his familiar symptoms.  However, he is at a high fall risk as evidenced by his objective measures and his need for maximal assistance from the treating physical therapist to prevent a fall.  Recommend that he continue with skilled physical therapy to address the identified impairments to maximize his safety and functional mobility.  OBJECTIVE IMPAIRMENTS: Abnormal gait, decreased activity tolerance, decreased balance, decreased mobility, difficulty walking, decreased ROM, decreased strength, postural dysfunction, and pain.   ACTIVITY LIMITATIONS: carrying, lifting, bending, standing, stairs, transfers, and locomotion level  PARTICIPATION LIMITATIONS: meal prep, cleaning, laundry, shopping, and community activity  PERSONAL FACTORS: Age, Fitness, Time since onset of injury/illness/exacerbation, and 3+ comorbidities: Allergies, angina, diabetes, visual impairments, hard  of hearing, history of myocardial infarction, arthritis, and history of cancer  are also affecting patient's functional outcome.   REHAB POTENTIAL: Fair    CLINICAL DECISION MAKING: Unstable/unpredictable  EVALUATION COMPLEXITY: High   GOALS: Goals reviewed with patient? Yes  LONG TERM GOALS: Target date: 10/14/22  Patient will be independent with his HEP. Baseline:  Goal status: INITIAL  2.  Patient will improve his timed up and to 15 seconds or less for improved safety. Baseline:  Goal status: INITIAL  3.  Patient will improve his dynamic gait index score to at least 15/25 for improved safety and mobility. Baseline:  Goal status: INITIAL  4.  Patient will be able to safely navigate at least 4 steps for improved household mobility. Baseline:  Goal status: INITIAL  PLAN:  PT FREQUENCY: 2x/week  PT DURATION: 4 weeks  PLANNED INTERVENTIONS: Therapeutic exercises, Therapeutic activity, Neuromuscular re-education, Balance training, Gait training, Patient/Family education, Self Care, Joint mobilization, Stair training, Electrical stimulation, Spinal mobilization, Cryotherapy, Moist heat, Manual therapy, and Re-evaluation.  PLAN FOR NEXT SESSION: NuStep, lumbar and lower extremity strengthening, postural reeducation, balance interventions, and modalities as needed   Darlin Coco, PT 09/16/2022, 3:33 PM

## 2022-09-18 ENCOUNTER — Inpatient Hospital Stay: Payer: Medicare Other

## 2022-09-18 ENCOUNTER — Inpatient Hospital Stay: Payer: Medicare Other | Attending: Physician Assistant | Admitting: Physician Assistant

## 2022-09-18 VITALS — BP 124/82 | HR 61 | Temp 97.9°F | Resp 18 | Ht 69.75 in | Wt 169.8 lb

## 2022-09-18 DIAGNOSIS — Z7901 Long term (current) use of anticoagulants: Secondary | ICD-10-CM | POA: Diagnosis not present

## 2022-09-18 DIAGNOSIS — D696 Thrombocytopenia, unspecified: Secondary | ICD-10-CM | POA: Diagnosis not present

## 2022-09-18 DIAGNOSIS — Z86718 Personal history of other venous thrombosis and embolism: Secondary | ICD-10-CM | POA: Insufficient documentation

## 2022-09-18 DIAGNOSIS — I2694 Multiple subsegmental pulmonary emboli without acute cor pulmonale: Secondary | ICD-10-CM

## 2022-09-18 DIAGNOSIS — D649 Anemia, unspecified: Secondary | ICD-10-CM

## 2022-09-18 DIAGNOSIS — I82453 Acute embolism and thrombosis of peroneal vein, bilateral: Secondary | ICD-10-CM

## 2022-09-18 DIAGNOSIS — Z86711 Personal history of pulmonary embolism: Secondary | ICD-10-CM | POA: Insufficient documentation

## 2022-09-18 DIAGNOSIS — Z85828 Personal history of other malignant neoplasm of skin: Secondary | ICD-10-CM | POA: Insufficient documentation

## 2022-09-18 DIAGNOSIS — I2510 Atherosclerotic heart disease of native coronary artery without angina pectoris: Secondary | ICD-10-CM | POA: Diagnosis not present

## 2022-09-18 LAB — IRON AND TIBC
Iron: 104 ug/dL (ref 45–182)
Saturation Ratios: 33 % (ref 17.9–39.5)
TIBC: 317 ug/dL (ref 250–450)
UIBC: 213 ug/dL

## 2022-09-18 LAB — CBC WITH DIFFERENTIAL/PLATELET
Abs Immature Granulocytes: 0 10*3/uL (ref 0.00–0.07)
Basophils Absolute: 0.1 10*3/uL (ref 0.0–0.1)
Basophils Relative: 1 %
Eosinophils Absolute: 0.1 10*3/uL (ref 0.0–0.5)
Eosinophils Relative: 2 %
HCT: 39.2 % (ref 39.0–52.0)
Hemoglobin: 12.8 g/dL — ABNORMAL LOW (ref 13.0–17.0)
Immature Granulocytes: 0 %
Lymphocytes Relative: 36 %
Lymphs Abs: 1.6 10*3/uL (ref 0.7–4.0)
MCH: 32.6 pg (ref 26.0–34.0)
MCHC: 32.7 g/dL (ref 30.0–36.0)
MCV: 99.7 fL (ref 80.0–100.0)
Monocytes Absolute: 0.4 10*3/uL (ref 0.1–1.0)
Monocytes Relative: 10 %
Neutro Abs: 2.3 10*3/uL (ref 1.7–7.7)
Neutrophils Relative %: 51 %
Platelets: 117 10*3/uL — ABNORMAL LOW (ref 150–400)
RBC: 3.93 MIL/uL — ABNORMAL LOW (ref 4.22–5.81)
RDW: 13.1 % (ref 11.5–15.5)
WBC: 4.5 10*3/uL (ref 4.0–10.5)
nRBC: 0 % (ref 0.0–0.2)

## 2022-09-18 LAB — COMPREHENSIVE METABOLIC PANEL
ALT: 20 U/L (ref 0–44)
AST: 20 U/L (ref 15–41)
Albumin: 4.2 g/dL (ref 3.5–5.0)
Alkaline Phosphatase: 61 U/L (ref 38–126)
Anion gap: 9 (ref 5–15)
BUN: 24 mg/dL — ABNORMAL HIGH (ref 8–23)
CO2: 27 mmol/L (ref 22–32)
Calcium: 9.6 mg/dL (ref 8.9–10.3)
Chloride: 101 mmol/L (ref 98–111)
Creatinine, Ser: 0.92 mg/dL (ref 0.61–1.24)
GFR, Estimated: >60 mL/min (ref >60)
Glucose, Bld: 133 mg/dL — ABNORMAL HIGH (ref 70–99)
Potassium: 4.3 mmol/L (ref 3.5–5.1)
Sodium: 137 mmol/L (ref 135–145)
Total Bilirubin: 0.6 mg/dL (ref 0.3–1.2)
Total Protein: 7 g/dL (ref 6.5–8.1)

## 2022-09-18 LAB — VITAMIN B12: Vitamin B-12: 535 pg/mL (ref 180–914)

## 2022-09-18 LAB — D-DIMER, QUANTITATIVE: D-Dimer, Quant: <0.27 ug/mL-FEU (ref 0.00–0.50)

## 2022-09-18 LAB — FOLATE: Folate: 14.8 ng/mL (ref >5.9)

## 2022-09-18 LAB — FERRITIN: Ferritin: 78 ng/mL (ref 24–336)

## 2022-09-18 NOTE — Progress Notes (Signed)
Michael Johnston, [place] [address]   CLINIC:  Medical Oncology/Hematology  PCP:  Dorothyann Peng, NP [place] [address] [phone]   REASON FOR VISIT:  Follow-up for unprovoked bilateral DVT and bilateral PE  CURRENT THERAPY: Eliquis  INTERVAL HISTORY:   Michael Johnston 89 y.o. male returns for routine follow-up of his DVT and PE, on chronic anticoagulation with Eliquis.  He was seen for initial consultation by Dr. Delton Coombes and Tarri Abernethy PA-C on 03/04/2022.  At today's visit, he reports feeling fairly well.  No recent hospitalizations, surgeries, or changes in baseline health status.  He continues to take Eliquis 5 mg twice daily as prescribed.  He denies any hematuria, rectal bleeding, melena, or epistaxis.  He reports constipation and dark discoloration to his bowel movements.  He has had issues with falling occasionally due to foot drop, but denies any falls within the past 6 months.  He denies any peripheral edema, chest pain, or dyspnea on exertion.  He bruises easily but denies petechial rash.  No B symptoms such as fever, chills, night sweats, unintentional weight loss.  He has 40% energy and 100% appetite. He endorses that he is maintaining a stable weight.   ASSESSMENT & PLAN:  1.  Unprovoked bilateral DVT and bilateral PE - Seen  at the request of hospitalist (Dr. Karleen Hampshire) and PCP (NP Dorothyann Peng) for evaluation of bilateral DVT and bilateral pulmonary embolism. -  Patient was seen by his PCP on 02/07/2021 for complaints of bilateral lower extremity edema.  D-dimer found to be significantly elevated at 11.01, therefore patient was sent to the ED where he was found to have bilateral age-indeterminate DVT with acute bilateral PE Vascular US lower extremity venous (02/08/2022): Age-indeterminate DVT of bilateral lower extremities (right posterior tibial and peroneal veins + left popliteal vein, posterior tibial vein,  and left peroneal veins) CTA chest (02/08/2022): Acute subsegmental pulmonary emboli in right upper and lower lobe, small clot burden 2D echo (02/09/2022): LVEF 60 to 65%, mildly reduced RV systolic function - Initially treated with IV heparin while hospitalized, transitioned to oral Eliquis at discharge.   - After starting Eliquis, he did have some hematuria associated with acute cystitis, treated with Augmentin x10 days.  He reports that hematuria has since resolved. - No prior blood per rectum, melena, or epistaxis.   Patient falls occasionally due to his foot drop, but denies any falls within the past 6 months. - No obvious provoking factors such as prolonged immobility, long flights, hospitalization, surgery, or injury.  Patient has very active lifestyle and is independent with ADLs. - No prior history of DVT or PE.  No family history of DVT, PE, or miscarriages. - Leg swelling and dyspnea on exertion have resolved. - PLAN: Due to unprovoked bilateral DVT and PE, recommend indefinite anticoagulation. - We discussed risks and benefits of prolonged anticoagulation, including risk of life-threatening bleeding event, which is considered to be less than the risk of recurrent blood clot if he were not on anticoagulation. - Patient remains eligible for 5 mg twice daily dosing (based on age >80 years, we would consider decreasing to 2.5 mg twice daily if he had body weight <60 kg or serum creatinine >1.5 mg) - No utility for hypercoagulable studies, since this would not change management. - We will see patient for follow-up in 6 months with labs (CBC, CMP, D-dimer) and ongoing risk benefit assessment.    2.  Thrombocytopenia, mild to  moderate - Mild intermittent thrombocytopenia since at least 2012.  Baseline platelets usually >100, but with transient worsening of thrombocytopenia during hospital stays. - CT abdomen/pelvis (09/15/2020): Liver and spleen unremarkable. - Ports easy bruising.  No petechial  rash, bleeding, or B symptoms. - CBC today (09/18/2022) shows platelets 117, at baseline. - DIFFERENTIAL DIAGNOSIS includes nutritional deficiency, immune mediated thrombocytopenia, or bone marrow infiltrative process such as MDS - PLAN: Nutritional panel obtained today is pending.  Will follow and make additional recommendations as needed. - It is reasonable to continue patient on Eliquis as long as his platelets remain >50,000.  3.  Other history - PMH: Coronary artery disease s/p CABG, cystic kidney disease, GERD, hyperlipidemia, skin cancer, sleep apnea (does not use CPAP), and arthritis. - SOCIAL: Patient lives at home with his wife of 55 years.  He is very active.  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  He smoked when he was a teenager.  He denies any alcohol or illicit drug use. - FAMILY: Family history is negative for any blood clots or known hypercoagulable states.  Sister passed away from unspecified cancer.  PLAN SUMMARY: >> Same-day labs (CBC/D, CMP) + OFFICE visit in 6 months    REVIEW OF SYSTEMS:   Review of Systems  Constitutional:  Positive for fatigue. Negative for appetite change, chills, diaphoresis, fever and unexpected weight change.  HENT:   Negative for lump/mass and nosebleeds.   Eyes:  Negative for eye problems.  Respiratory:  Negative for cough, hemoptysis and shortness of breath.   Cardiovascular:  Negative for chest pain, leg swelling and palpitations.  Gastrointestinal:  Positive for constipation. Negative for abdominal pain, blood in stool, diarrhea, nausea and vomiting.  Genitourinary:  Positive for difficulty urinating (slow flow). Negative for hematuria.   Skin: Negative.   Neurological:  Positive for dizziness. Negative for headaches and light-headedness.  Hematological:  Does not bruise/bleed easily.     PHYSICAL EXAM:  ECOG PERFORMANCE STATUS: 1 - Symptomatic but completely ambulatory  There were no vitals filed for  this visit. There were no vitals filed for this visit. Physical Exam Constitutional:      Appearance: Normal appearance.  HENT:     Head: Normocephalic and atraumatic.     Mouth/Throat:     Mouth: Mucous membranes are moist.  Eyes:     Extraocular Movements: Extraocular movements intact.     Pupils: Pupils are equal, round, and reactive to light.  Cardiovascular:     Rate and Rhythm: Normal rate and regular rhythm.     Pulses: Normal pulses.     Heart sounds: Normal heart sounds.  Pulmonary:     Effort: Pulmonary effort is normal.     Breath sounds: Normal breath sounds.  Abdominal:     General: Bowel sounds are normal.     Palpations: Abdomen is soft.     Tenderness: There is no abdominal tenderness.  Musculoskeletal:        General: No swelling.     Right lower leg: No edema.     Left lower leg: No edema.  Lymphadenopathy:     Cervical: No cervical adenopathy.  Skin:    General: Skin is warm and dry.     Findings: Bruising present.  Neurological:     General: No focal deficit present.     Mental Status: He is alert and oriented to person, place, and time.  Psychiatric:  Mood and Affect: Mood normal.        Behavior: Behavior normal.     PAST MEDICAL/SURGICAL HISTORY:  Past Medical History:  Diagnosis Date   Arthritis    "right shoulder" (05/24/2015)   CHEST PAIN    Chronic bronchitis ([place])    "get it ~ q yr" (05/24/2015)   COLONIC POLYPS, HX OF    COPD (chronic obstructive pulmonary disease) ([place])    "dx'd but I don't take RX for it" (05/24/2015)   Coronary artery disease    Cystic kidney disease    GERD (gastroesophageal reflux disease)    History of hiatal hernia    MI (myocardial infarction) ([place]) 09/2010   Mixed hyperlipidemia    Skin cancer    "top of my head only" (05/24/2015)   Sleep apnea    Syncope and collapse ~ 2013; 05/24/2015   Past Surgical History:  Procedure Laterality Date   CARDIAC CATHETERIZATION  09/2010   CHOLECYSTECTOMY OPEN   1998   COLECTOMY  1998   "partial"   CORONARY ANGIOPLASTY     CORONARY ARTERY BYPASS GRAFT  09/2010   Median sternotomy for coronary artery bypass grafting x3  (left internal mammary artery to distal left anterior  descending  coronary artery, saphenous vein graft to first diagonal branch,  saphenous vein graft to first  obtuse marginal branch, endoscopic  saphenous vein harvest from right thigh). SURGEON:  Valentina Gu.  Roxy Manns, MD  ASSISTANT:  John Giovanni, PA-C  ANESTHESIA:  Glynda Jaeger, MD    LEFT HEART CATH AND CORS/GRAFTS ANGIOGRAPHY N/A 08/22/2017   Procedure: LEFT HEART CATH AND CORS/GRAFTS ANGIOGRAPHY;  Surgeon: Martinique, Peter M, MD;  Location: [facility];  Service: Cardiovascular;  Laterality: N/A;   SKIN CANCER EXCISION     "top of my head"    SOCIAL HISTORY:  Social History   Socioeconomic History   Marital status: Married    Spouse name: Not on file   Number of children: Not on file   Years of education: Not on file   Highest education level: Not on file  Occupational History   Not on file  Tobacco Use   Smoking status: Former    Packs/day: 1.50    Years: 3.00    Total pack years: 4.50    Types: Cigarettes    Quit date: 12/09/1951    Years since quitting: 70.8   Smokeless tobacco: Never  Vaping Use   Vaping Use: Never used  Substance and Sexual Activity   Alcohol use: Not Currently    Comment: "no alcohol since 1953"   Drug use: No   Sexual activity: Not on file  Other Topics Concern   Not on file  Social History Narrative   Retired    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[place] Strain: [place]  (10/16/2021)   Overall Financial Resource Strain (CARDIA)    Difficulty of Paying Living Expenses: Not hard at all  Food Insecurity: No Food Insecurity (10/16/2021)   Hunger Vital Sign    Worried About Running Out of Food in the Last Year: Never true    [place] in the Last Year: Never true  Transportation  Needs: No Transportation Needs (10/16/2021)   PRAPARE - Hydrologist (Medical): No    Lack of Transportation (Non-Medical): No  Physical Activity: Insufficiently Active (10/16/2021)   Exercise Vital Sign  Days of Exercise per Week: 2 days    Minutes of Exercise per Session: 40 min  Stress: No Stress Concern Present (10/16/2021)   [place]    Feeling of Stress : Not at all  Social Connections: [place] (10/16/2021)   Social Connection and Isolation Panel [NHANES]    Frequency of Communication with Friends and Family: More than three times a week    Frequency of Social Gatherings with Friends and Family: More than three times a week    Attends Religious Services: More than 4 times per year    Active Member of [employer] or Organizations: Yes    Attends Music therapist: More than 4 times per year    Marital Status: Married  Human resources officer Violence: Not At Risk (10/16/2021)   Humiliation, Afraid, Rape, and Kick questionnaire    Fear of Current or Ex-Partner: No    Emotionally Abused: No    Physically Abused: No    Sexually Abused: No    FAMILY HISTORY:  Family History  Problem Relation Age of Onset   Lung cancer Sister    Heart attack Father     CURRENT MEDICATIONS:  Outpatient Encounter Medications as of 09/18/2022  Medication Sig   apixaban (ELIQUIS) 5 MG TABS tablet Take 1 tablet (5 mg total) by mouth 2 (two) times daily.   aspirin 81 MG tablet Take 81 mg by mouth daily.   atorvastatin (LIPITOR) 20 MG tablet TAKE 1 TABLET BY MOUTH EVERY DAY   cyanocobalamin (VITAMIN B12) 1000 MCG/ML injection INJECT 1 ML INTRAMUSCULARLY EVERY OTHER WEEK   diclofenac Sodium (VOLTAREN) 1 % GEL Apply 2 g topically 4 (four) times daily as needed (For pain).   EPINEPHrine 0.3 mg/0.3 mL IJ SOAJ injection    ketoconazole (NIZORAL) 2 % shampoo USE TWICE WEEKLY   metFORMIN (GLUCOPHAGE) 500 MG  tablet TAKE 1 TABLET BY MOUTH EVERY DAY WITH BREAKFAST   metoprolol tartrate (LOPRESSOR) 25 MG tablet TAKE 1 TABLET BY MOUTH TWICE A DAY   nitroGLYCERIN (NITROSTAT) 0.4 MG SL tablet Place 1 tablet (0.4 mg total) under the tongue every 5 (five) minutes as needed for chest pain (3 doses max).   SYRINGE-NEEDLE, DISP, 3 ML (B-D 3CC LUER-LOK SYR 25GX1") 25G X 1" 3 ML MISC USE AS DIRECTED TO INJECT B12   No facility-administered encounter medications on file as of 09/18/2022.    ALLERGIES:  Allergies  Allergen Reactions   Levaquin [Levofloxacin In D5w] Swelling and Other (See Comments)    Eyes swollen   Other Anaphylaxis and Other (See Comments)    Bee sting   Ivp Dye [Iodinated Contrast Media] Other (See Comments)    Patient reports seizure   Metformin And Related Nausea Only    LABORATORY DATA:  I have reviewed the labs as listed.  CBC    Component Value Date/Time   WBC 5.4 08/21/2022 0905   RBC 4.04 (L) 08/21/2022 0905   HGB 13.3 08/21/2022 0905   HCT 39.2 08/21/2022 0905   PLT 184.0 08/21/2022 0905   MCV 97.0 08/21/2022 0905   MCH 32.3 02/10/2022 0255   MCHC 33.8 08/21/2022 0905   RDW 14.1 08/21/2022 0905   LYMPHSABS 1.8 08/21/2022 0905   MONOABS 0.4 08/21/2022 0905   EOSABS 0.1 08/21/2022 0905   BASOSABS 0.1 08/21/2022 0905      Latest Ref Rng & Units 08/21/2022    9:05 AM 02/15/2022   10:02 AM 02/09/2022  7:17 AM  CMP  Glucose 70 - 99 mg/dL 128  111  108   BUN 6 - 23 mg/dL '22  23  14   '$ Creatinine 0.40 - 1.50 mg/dL 0.83  0.82  0.77   Sodium 135 - 145 mEq/L 140  138  139   Potassium 3.5 - 5.1 mEq/L 4.6  4.1  4.0   Chloride 96 - 112 mEq/L 103  103  107   CO2 19 - 32 mEq/L '31  27  26   '$ Calcium 8.4 - 10.5 mg/dL 9.5  9.4  8.8   Total Protein 6.0 - 8.3 g/dL 6.8     Total Bilirubin 0.2 - 1.2 mg/dL 0.8     Alkaline Phos 39 - 117 U/L 58     AST 0 - 37 U/L 20     ALT 0 - 53 U/L 17       DIAGNOSTIC IMAGING:  I have independently reviewed the relevant imaging and  discussed with the patient.   WRAP UP:  All questions were answered. The patient knows to call the clinic with any problems, questions or concerns.  Medical decision making: Moderate  Time spent on visit: I spent 20 minutes counseling the patient face to face. The total time spent in the appointment was 30 minutes and more than 50% was on counseling.  Harriett Rush, PA-C  09/18/22 2:19 PM

## 2022-09-18 NOTE — Patient Instructions (Addendum)
[place] at [facility] Discharge Instructions  You were seen today by Tarri Abernethy PA-C for your history of blood clots in your legs and lungs.  Since your blood clots were UNPROVOKED (no identifiable cause), we recommend indefinite use of blood thinners (also called anticoagulation).  You should continue your Eliquis 5 mg twice daily as prescribed.  Seek IMMEDIATE medical attention if you have any signs of abnormal bleeding such as blood in your urine, bright red blood in your bowel movements, black tarry bowel movements, or severe nosebleeds.  You will also need to be EXTREMELY CAUTIOUS about home safety and falls, since these pose a greater risk to you than they did in the past and in some cases can result in life-threatening bleeding events.  Seek IMMEDIATE medical attention if you have any symptoms of recurrent blood clots such as worsening swelling in your legs or new shortness of breath, cough, or chest pain.  Your platelet levels are mildly low.  We checked labs today to see if you have any vitamin or mineral deficiency that would be causing your low platelets.  At this time, you do not require any extensive testing or treatment of your low platelets, but we will continue to monitor them.  As long as your platelets are only mildly low, it is still okay for you to continue taking blood thinners like Eliquis.  We will see you for labs and follow-up visit in 6 months.   - - - - - - - - - - - - - - - - - -   Thank you for choosing [place] at [facility] to provide your oncology and hematology care.  To afford each patient quality time with our provider, please arrive at least 15 minutes before your scheduled appointment time.   If you have a lab appointment with the [facility] please come in thru the Main Entrance and check in at the main information desk.  You need to re-schedule your appointment should you arrive 10 or more  minutes late.  We strive to give you quality time with our providers, and arriving late affects you and other patients whose appointments are after yours.  Also, if you no show three or more times for appointments you may be dismissed from the clinic at the providers discretion.     Again, thank you for choosing [facility].  Our hope is that these requests will decrease the amount of time that you wait before being seen by our physicians.       _____________________________________________________________  Should you have questions after your visit to [facility], please contact our office at [phone] and follow the prompts.  Our office hours are 8:00 a.m. and 4:30 p.m. Monday - Friday.  Please note that voicemails left after 4:00 p.m. may not be returned until the following business day.  We are closed weekends and major holidays.  You do have access to a nurse 24-7, just call the main number to the clinic [phone] and do not press any options, hold on the line and a nurse will answer the phone.    For prescription refill requests, have your pharmacy contact our office and allow 72 hours.    Due to Covid, you will need to wear a mask upon entering the hospital. If you do not have a mask, a mask will be given to you at the Main Entrance upon arrival. For doctor  visits, patients may have 1 support person age 41 or older with them. For treatment visits, patients can not have anyone with them due to social distancing guidelines and our immunocompromised population.

## 2022-09-19 LAB — HOMOCYSTEINE: Homocysteine: 10.4 umol/L (ref 0.0–21.3)

## 2022-09-20 ENCOUNTER — Encounter: Payer: Self-pay | Admitting: Physical Therapy

## 2022-09-20 ENCOUNTER — Ambulatory Visit: Payer: Medicare Other | Admitting: Physical Therapy

## 2022-09-20 DIAGNOSIS — R2681 Unsteadiness on feet: Secondary | ICD-10-CM

## 2022-09-20 DIAGNOSIS — G8929 Other chronic pain: Secondary | ICD-10-CM | POA: Diagnosis not present

## 2022-09-20 DIAGNOSIS — M5459 Other low back pain: Secondary | ICD-10-CM | POA: Diagnosis not present

## 2022-09-20 DIAGNOSIS — M6281 Muscle weakness (generalized): Secondary | ICD-10-CM | POA: Diagnosis not present

## 2022-09-20 DIAGNOSIS — M545 Low back pain, unspecified: Secondary | ICD-10-CM | POA: Diagnosis not present

## 2022-09-20 LAB — COPPER, SERUM: Copper: 99 ug/dL (ref 69–132)

## 2022-09-20 NOTE — Therapy (Signed)
OUTPATIENT PHYSICAL THERAPY THORACOLUMBAR TREATMENT   Patient Name: Michael Johnston MRN: [id number] DOB:Sep 11, 1931, 89 y.o., male Today's Date: 09/20/2022  END OF SESSION:  PT End of Session - 09/20/22 0903     Visit Number 2    Number of Visits 8    Date for PT Re-Evaluation 12/06/22    PT Start Time 0902    PT Stop Time 0940    PT Time Calculation (min) 38 min    Activity Tolerance Patient tolerated treatment well    Behavior During Therapy [facility] for tasks assessed/performed            Past Medical History:  Diagnosis Date   Arthritis    "right shoulder" (05/24/2015)   CHEST PAIN    Chronic bronchitis ([place])    "get it ~ q yr" (05/24/2015)   COLONIC POLYPS, HX OF    COPD (chronic obstructive pulmonary disease) ([place])    "dx'd but I don't take RX for it" (05/24/2015)   Coronary artery disease    Cystic kidney disease    GERD (gastroesophageal reflux disease)    History of hiatal hernia    MI (myocardial infarction) ([place]) 09/2010   Mixed hyperlipidemia    Skin cancer    "top of my head only" (05/24/2015)   Sleep apnea    Syncope and collapse ~ 2013; 05/24/2015   Past Surgical History:  Procedure Laterality Date   CARDIAC CATHETERIZATION  09/2010   [place]   "partial"   CORONARY ANGIOPLASTY     CORONARY ARTERY BYPASS GRAFT  09/2010   Median sternotomy for coronary artery bypass grafting x3  (left internal mammary artery to distal left anterior  descending  coronary artery, saphenous vein graft to first diagonal branch,  saphenous vein graft to first  obtuse marginal branch, endoscopic  saphenous vein harvest from right thigh). SURGEON:  Valentina Gu.  Roxy Manns, MD  ASSISTANT:  John Giovanni, PA-C  ANESTHESIA:  Glynda Jaeger, MD    LEFT HEART CATH AND CORS/GRAFTS ANGIOGRAPHY N/A 08/22/2017   Procedure: LEFT HEART CATH AND CORS/GRAFTS ANGIOGRAPHY;  Surgeon: Martinique, Peter M, MD;  Location: [place] CV LAB;  Service: Cardiovascular;   Laterality: N/A;   SKIN CANCER EXCISION     "top of my head"   Patient Active Problem List   Diagnosis Date Noted   Pulmonary embolus ([place]) 02/08/2022   Combined forms of age-related cataract of left eye 11/10/2018   Combined forms of age-related cataract of right eye 11/03/2018   Intraoperative floppy iris syndrome (IFIS) 11/03/2018   Urine stream spraying 02/23/2018   Flank pain 02/22/2018   Chest pain 08/21/2017   Unstable angina ([place]) 08/21/2017   Renal cyst 12/20/2016   B12 deficiency 11/08/2016   Type 2 diabetes mellitus with neurological complications ([place]) 93/90/3009   Syncope and collapse 05/24/2015   Benign prostatic hyperplasia with urinary obstruction 12/12/2014   Carotid artery stenosis 05/22/2012   CAD (coronary artery disease) 05/14/2012   ED (erectile dysfunction) of organic origin 08/19/2011   FH: CABG (coronary artery bypass surgery) 01/28/2011   Mixed hyperlipidemia 10/23/2010   GERD (gastroesophageal reflux disease) 10/22/2010   History of colonic polyps 06/14/2010   REFERRING PROVIDER: Dorothyann Peng, NP   REFERRING DIAG: Chronic midline low back pain without sciatica   Rationale for Evaluation and Treatment: Rehabilitation  THERAPY DIAG:  Unsteadiness on feet  Muscle weakness (generalized)  Other low back pain  ONSET DATE: unsure  SUBJECTIVE:                                                                                                                                                                                           SUBJECTIVE STATEMENT: Reports feeling weak but will do what he can.    PERTINENT HISTORY:  Allergies, angina, diabetes, visual impairments, hard of hearing, history of myocardial infarction, arthritis, and history of cancer  PAIN:  Are you having pain? Yes: NPRS scale: no pain score provided/10 Pain location: "down my back" Pain description: "weakness" Aggravating factors: walking, stand up straight  Relieving factors:  bending over  PRECAUTIONS: Fall  PATIENT GOALS: improved strength, stand and walk longer (<5 minutes currently), and be able to stand upright while walking  NEXT MD VISIT: unsure  OBJECTIVE:   LUMBAR ROM:   AROM eval  Flexion Not assessed due to safety; stands at 14 degrees of lumbar flexion  Extension 2  Right lateral flexion   Left lateral flexion   Right rotation 50% limited required modA due to LOB  Left rotation 75% limited; required maxA due to LOB   (Blank rows = not tested)  LOWER EXTREMITY ROM: WFL for activities assessed  LOWER EXTREMITY MMT:    MMT Right eval Left eval  Hip flexion 4/5 4/5  Hip extension    Hip abduction    Hip adduction    Hip internal rotation    Hip external rotation    Knee flexion 4-/5 4-/5  Knee extension 5/5 4/5  Ankle dorsiflexion 3/5 3/5  Ankle plantarflexion    Ankle inversion    Ankle eversion     (Blank rows = not tested)  FUNCTIONAL TESTS:  5 times sit to stand: 16.51 seconds w/o UE support; required cueing throughout for upright posture Timed up and go (TUG): 19.91 seconds w/o assistive device; required close supervision Dynamic Gait Index: 4/25 Sit to stand transfers: required close supervision Stand to sit transfers: required close supervision with uncontrolled descent  TODAY'S TREATMENT:  DATE:                                     EXERCISE LOG  Exercise Repetitions and Resistance Comments  Nustep L2 x11 min 0.5 mi  Hip flexoin X20 reps   Hip abduction X20 reps   LAQ 3# x15 reps   Clam  Red x20 reps   Seated shoulder row Red x20 reps   Bridge X15 reps    Blank cell = exercise not performed today   Modalities  Date:  Unattended Estim: Lumbar, Pre-Mod, 8 mins, Pain  PATIENT EDUCATION:  Education details: POC, goals for therapy, safety Person educated: Patient Education method:  Explanation Education comprehension: verbalized understanding  HOME EXERCISE PROGRAM:  ASSESSMENT:  CLINICAL IMPRESSION: Patient presented in clinic with reports of fatigue and weakness. Patient able to tolerate moderate therex with reporting weakness and fatigue throughout the session. Patient unable to communicate what causes his pain to occur but he still does his household activities. Normal stimulation response noted but stopped after 8 minute due to patient having to go to the bathroom. No complaints after end of treatment.  OBJECTIVE IMPAIRMENTS: Abnormal gait, decreased activity tolerance, decreased balance, decreased mobility, difficulty walking, decreased ROM, decreased strength, postural dysfunction, and pain.   ACTIVITY LIMITATIONS: carrying, lifting, bending, standing, stairs, transfers, and locomotion level  PARTICIPATION LIMITATIONS: meal prep, cleaning, laundry, shopping, and community activity  PERSONAL FACTORS: Age, Fitness, Time since onset of injury/illness/exacerbation, and 3+ comorbidities: Allergies, angina, diabetes, visual impairments, hard of hearing, history of myocardial infarction, arthritis, and history of cancer  are also affecting patient's functional outcome.   REHAB POTENTIAL: Fair    CLINICAL DECISION MAKING: Unstable/unpredictable  EVALUATION COMPLEXITY: High  GOALS: Goals reviewed with patient? Yes  LONG TERM GOALS: Target date: 10/14/22  Patient will be independent with his HEP. Baseline:  Goal status: INITIAL  2.  Patient will improve his timed up and to 15 seconds or less for improved safety. Baseline:  Goal status: INITIAL  3.  Patient will improve his dynamic gait index score to at least 15/25 for improved safety and mobility. Baseline:  Goal status: INITIAL  4.  Patient will be able to safely navigate at least 4 steps for improved household mobility. Baseline:  Goal status: INITIAL  PLAN:  PT FREQUENCY: 2x/week  PT DURATION: 4  weeks  PLANNED INTERVENTIONS: Therapeutic exercises, Therapeutic activity, Neuromuscular re-education, Balance training, Gait training, Patient/Family education, Self Care, Joint mobilization, Stair training, Electrical stimulation, Spinal mobilization, Cryotherapy, Moist heat, Manual therapy, and Re-evaluation.  PLAN FOR NEXT SESSION: NuStep, lumbar and lower extremity strengthening, postural reeducation, balance interventions, and modalities as needed  Standley Brooking, PTA 09/20/2022, 9:52 AM

## 2022-09-23 LAB — METHYLMALONIC ACID, SERUM: Methylmalonic Acid, Quantitative: 191 nmol/L (ref 0–378)

## 2022-09-24 DIAGNOSIS — M109 Gout, unspecified: Secondary | ICD-10-CM | POA: Diagnosis not present

## 2022-09-25 ENCOUNTER — Ambulatory Visit: Payer: Medicare Other | Admitting: Physical Therapy

## 2022-09-25 ENCOUNTER — Encounter: Payer: Self-pay | Admitting: Physical Therapy

## 2022-09-25 DIAGNOSIS — M6281 Muscle weakness (generalized): Secondary | ICD-10-CM | POA: Diagnosis not present

## 2022-09-25 DIAGNOSIS — G8929 Other chronic pain: Secondary | ICD-10-CM | POA: Diagnosis not present

## 2022-09-25 DIAGNOSIS — M5459 Other low back pain: Secondary | ICD-10-CM | POA: Diagnosis not present

## 2022-09-25 DIAGNOSIS — R2681 Unsteadiness on feet: Secondary | ICD-10-CM | POA: Diagnosis not present

## 2022-09-25 DIAGNOSIS — M545 Low back pain, unspecified: Secondary | ICD-10-CM | POA: Diagnosis not present

## 2022-09-25 NOTE — Therapy (Signed)
OUTPATIENT PHYSICAL THERAPY THORACOLUMBAR TREATMENT   Patient Name: Michael Johnston MRN: [id number] DOB:1932/05/27, 89 y.o., male Today's Date: 09/25/2022  END OF SESSION:  PT End of Session - 09/25/22 1048     Visit Number 3    Number of Visits 8    Date for PT Re-Evaluation 12/06/22    PT Start Time 1031    PT Stop Time 1126    PT Time Calculation (min) 55 min    Activity Tolerance Patient tolerated treatment well    Behavior During Therapy [facility] for tasks assessed/performed            Past Medical History:  Diagnosis Date   Arthritis    "right shoulder" (05/24/2015)   CHEST PAIN    Chronic bronchitis ([place])    "get it ~ q yr" (05/24/2015)   COLONIC POLYPS, HX OF    COPD (chronic obstructive pulmonary disease) ([place])    "dx'd but I don't take RX for it" (05/24/2015)   Coronary artery disease    Cystic kidney disease    GERD (gastroesophageal reflux disease)    History of hiatal hernia    MI (myocardial infarction) ([place]) 09/2010   Mixed hyperlipidemia    Skin cancer    "top of my head only" (05/24/2015)   Sleep apnea    Syncope and collapse ~ 2013; 05/24/2015   Past Surgical History:  Procedure Laterality Date   CARDIAC CATHETERIZATION  09/2010   [place]   "partial"   CORONARY ANGIOPLASTY     CORONARY ARTERY BYPASS GRAFT  09/2010   Median sternotomy for coronary artery bypass grafting x3  (left internal mammary artery to distal left anterior  descending  coronary artery, saphenous vein graft to first diagonal branch,  saphenous vein graft to first  obtuse marginal branch, endoscopic  saphenous vein harvest from right thigh). SURGEON:  Valentina Gu.  Roxy Manns, MD  ASSISTANT:  John Giovanni, PA-C  ANESTHESIA:  Glynda Jaeger, MD    LEFT HEART CATH AND CORS/GRAFTS ANGIOGRAPHY N/A 08/22/2017   Procedure: LEFT HEART CATH AND CORS/GRAFTS ANGIOGRAPHY;  Surgeon: Martinique, Peter M, MD;  Location: [place] CV [facility];  Service: Cardiovascular;   Laterality: N/A;   SKIN CANCER EXCISION     "top of my head"   Patient Active Problem List   Diagnosis Date Noted   Pulmonary embolus ([place]) 02/08/2022   Combined forms of age-related cataract of left eye 11/10/2018   Combined forms of age-related cataract of right eye 11/03/2018   Intraoperative floppy iris syndrome (IFIS) 11/03/2018   Urine stream spraying 02/23/2018   Flank pain 02/22/2018   Chest pain 08/21/2017   Unstable angina ([place]) 08/21/2017   Renal cyst 12/20/2016   B12 deficiency 11/08/2016   Type 2 diabetes mellitus with neurological complications ([place]) 46/96/2952   Syncope and collapse 05/24/2015   Benign prostatic hyperplasia with urinary obstruction 12/12/2014   Carotid artery stenosis 05/22/2012   CAD (coronary artery disease) 05/14/2012   ED (erectile dysfunction) of organic origin 08/19/2011   FH: CABG (coronary artery bypass surgery) 01/28/2011   Mixed hyperlipidemia 10/23/2010   GERD (gastroesophageal reflux disease) 10/22/2010   History of colonic polyps 06/14/2010   REFERRING PROVIDER: Dorothyann Peng, NP   REFERRING DIAG: Chronic midline low back pain without sciatica   Rationale for Evaluation and Treatment: Rehabilitation  THERAPY DIAG:  Unsteadiness on feet  Muscle weakness (generalized)  Other low back pain  ONSET DATE: unsure  SUBJECTIVE:                                                                                                                                                                                           SUBJECTIVE STATEMENT: No new complaints today.  PERTINENT HISTORY:  Allergies, angina, diabetes, visual impairments, hard of hearing, history of myocardial infarction, arthritis, and history of cancer  PAIN:  Are you having pain? Yes: NPRS scale: no pain score provided/10 Pain location: "down my back" Pain description: "weakness" Aggravating factors: walking, stand up straight  Relieving factors: bending  over  PRECAUTIONS: Fall  PATIENT GOALS: improved strength, stand and walk longer (<5 minutes currently), and be able to stand upright while walking  NEXT MD VISIT: unsure  OBJECTIVE:   LUMBAR ROM:   AROM eval  Flexion Not assessed due to safety; stands at 14 degrees of lumbar flexion  Extension 2  Right lateral flexion   Left lateral flexion   Right rotation 50% limited required modA due to LOB  Left rotation 75% limited; required maxA due to LOB   (Blank rows = not tested)  LOWER EXTREMITY ROM: WFL for activities assessed  LOWER EXTREMITY MMT:    MMT Right eval Left eval  Hip flexion 4/5 4/5  Hip extension    Hip abduction    Hip adduction    Hip internal rotation    Hip external rotation    Knee flexion 4-/5 4-/5  Knee extension 5/5 4/5  Ankle dorsiflexion 3/5 3/5  Ankle plantarflexion    Ankle inversion    Ankle eversion     (Blank rows = not tested)  FUNCTIONAL TESTS:  5 times sit to stand: 16.51 seconds w/o UE support; required cueing throughout for upright posture Timed up and go (TUG): 19.91 seconds w/o assistive device; required close supervision Dynamic Gait Index: 4/25 Sit to stand transfers: required close supervision Stand to sit transfers: required close supervision with uncontrolled descent  TODAY'S TREATMENT:  DATE:                                     EXERCISE LOG  Exercise Repetitions and Resistance Comments  Nustep L2 x15 min 0.68 mi  Lat pulldown Blue XTS x20 reps   Hip flexoin X20 reps   Hip abduction X20 reps   LAQ 3# 2x15 reps   Clam  Red x20 reps   Supine core activation  X5 reps 5 sec holds   Supine marching  With core activation x20 reps   Bridge X20 reps    Blank cell = exercise not performed today   Modalities  Date:  Unattended Estim: Lumbar, Pre-Mod, 10 mins, Pain  PATIENT EDUCATION:  Education  details: POC, goals for therapy, safety Person educated: Patient Education method: Explanation Education comprehension: verbalized understanding  HOME EXERCISE PROGRAM:  ASSESSMENT:  CLINICAL IMPRESSION: Patient presented in clinic with no new complaints. Patient progressed through therex with emphasis with standing exercises on proper posture. Patient also introduced to core activation training in supine currently but may require further instruction in future PT sessions. No increased LBP reported during PT session. Normal stimulation response noted following removal of the modality.  OBJECTIVE IMPAIRMENTS: Abnormal gait, decreased activity tolerance, decreased balance, decreased mobility, difficulty walking, decreased ROM, decreased strength, postural dysfunction, and pain.   ACTIVITY LIMITATIONS: carrying, lifting, bending, standing, stairs, transfers, and locomotion level  PARTICIPATION LIMITATIONS: meal prep, cleaning, laundry, shopping, and community activity  PERSONAL FACTORS: Age, Fitness, Time since onset of injury/illness/exacerbation, and 3+ comorbidities: Allergies, angina, diabetes, visual impairments, hard of hearing, history of myocardial infarction, arthritis, and history of cancer  are also affecting patient's functional outcome.   REHAB POTENTIAL: Fair    CLINICAL DECISION MAKING: Unstable/unpredictable  EVALUATION COMPLEXITY: High  GOALS: Goals reviewed with patient? Yes  LONG TERM GOALS: Target date: 10/14/22  Patient will be independent with his HEP. Baseline:  Goal status: INITIAL  2.  Patient will improve his timed up and to 15 seconds or less for improved safety. Baseline:  Goal status: INITIAL  3.  Patient will improve his dynamic gait index score to at least 15/25 for improved safety and mobility. Baseline:  Goal status: INITIAL  4.  Patient will be able to safely navigate at least 4 steps for improved household mobility. Baseline:  Goal status:  INITIAL  PLAN:  PT FREQUENCY: 2x/week  PT DURATION: 4 weeks  PLANNED INTERVENTIONS: Therapeutic exercises, Therapeutic activity, Neuromuscular re-education, Balance training, Gait training, Patient/Family education, Self Care, Joint mobilization, Stair training, Electrical stimulation, Spinal mobilization, Cryotherapy, Moist heat, Manual therapy, and Re-evaluation.  PLAN FOR NEXT SESSION: NuStep, lumbar and lower extremity strengthening, postural reeducation, balance interventions, and modalities as needed  Standley Brooking, PTA 09/25/2022, 11:41 AM

## 2022-09-26 DIAGNOSIS — N281 Cyst of kidney, acquired: Secondary | ICD-10-CM | POA: Diagnosis not present

## 2022-09-26 DIAGNOSIS — N138 Other obstructive and reflux uropathy: Secondary | ICD-10-CM | POA: Diagnosis not present

## 2022-10-02 ENCOUNTER — Ambulatory Visit: Payer: Medicare Other | Admitting: Physical Therapy

## 2022-10-02 ENCOUNTER — Encounter: Payer: Self-pay | Admitting: Physical Therapy

## 2022-10-02 DIAGNOSIS — R2681 Unsteadiness on feet: Secondary | ICD-10-CM

## 2022-10-02 DIAGNOSIS — M5459 Other low back pain: Secondary | ICD-10-CM

## 2022-10-02 DIAGNOSIS — M6281 Muscle weakness (generalized): Secondary | ICD-10-CM

## 2022-10-02 DIAGNOSIS — M545 Low back pain, unspecified: Secondary | ICD-10-CM | POA: Diagnosis not present

## 2022-10-02 DIAGNOSIS — G8929 Other chronic pain: Secondary | ICD-10-CM | POA: Diagnosis not present

## 2022-10-02 NOTE — Therapy (Signed)
OUTPATIENT PHYSICAL THERAPY THORACOLUMBAR TREATMENT   Patient Name: Michael Johnston MRN: [id number] DOB:May 08, 1932, 89 y.o., male Today's Date: 10/02/2022  END OF SESSION:  PT End of Session - 10/02/22 1034     Visit Number 4    Number of Visits 8    Date for PT Re-Evaluation 12/06/22    PT Start Time 1032    PT Stop Time 1115    PT Time Calculation (min) 43 min    Activity Tolerance Patient tolerated treatment well    Behavior During Therapy [facility] for tasks assessed/performed            Past Medical History:  Diagnosis Date   Arthritis    "right shoulder" (05/24/2015)   CHEST PAIN    Chronic bronchitis ([place])    "get it ~ q yr" (05/24/2015)   COLONIC POLYPS, HX OF    COPD (chronic obstructive pulmonary disease) ([place])    "dx'd but I don't take RX for it" (05/24/2015)   Coronary artery disease    Cystic kidney disease    GERD (gastroesophageal reflux disease)    History of hiatal hernia    MI (myocardial infarction) ([place]) 09/2010   Mixed hyperlipidemia    Skin cancer    "top of my head only" (05/24/2015)   Sleep apnea    Syncope and collapse ~ 2013; 05/24/2015   Past Surgical History:  Procedure Laterality Date   CARDIAC CATHETERIZATION  09/2010   [place]   "partial"   CORONARY ANGIOPLASTY     CORONARY ARTERY BYPASS GRAFT  09/2010   Median sternotomy for coronary artery bypass grafting x3  (left internal mammary artery to distal left anterior  descending  coronary artery, saphenous vein graft to first diagonal branch,  saphenous vein graft to first  obtuse marginal branch, endoscopic  saphenous vein harvest from right thigh). SURGEON:  Valentina Gu.  Roxy Manns, MD  ASSISTANT:  John Giovanni, PA-C  ANESTHESIA:  Glynda Jaeger, MD    LEFT HEART CATH AND CORS/GRAFTS ANGIOGRAPHY N/A 08/22/2017   Procedure: LEFT HEART CATH AND CORS/GRAFTS ANGIOGRAPHY;  Surgeon: Martinique, Peter M, MD;  Location: [place] [facility];  Service: Cardiovascular;   Laterality: N/A;   SKIN CANCER EXCISION     "top of my head"   Patient Active Problem List   Diagnosis Date Noted   Pulmonary embolus ([place]) 02/08/2022   Combined forms of age-related cataract of left eye 11/10/2018   Combined forms of age-related cataract of right eye 11/03/2018   Intraoperative floppy iris syndrome (IFIS) 11/03/2018   Urine stream spraying 02/23/2018   Flank pain 02/22/2018   Chest pain 08/21/2017   Unstable angina ([place]) 08/21/2017   Renal cyst 12/20/2016   B12 deficiency 11/08/2016   Type 2 diabetes mellitus with neurological complications ([place]) 24/23/5361   Syncope and collapse 05/24/2015   Benign prostatic hyperplasia with urinary obstruction 12/12/2014   Carotid artery stenosis 05/22/2012   CAD (coronary artery disease) 05/14/2012   ED (erectile dysfunction) of organic origin 08/19/2011   FH: CABG (coronary artery bypass surgery) 01/28/2011   Mixed hyperlipidemia 10/23/2010   GERD (gastroesophageal reflux disease) 10/22/2010   History of colonic polyps 06/14/2010   REFERRING PROVIDER: Dorothyann Peng, NP   REFERRING DIAG: Chronic midline low back pain without sciatica   Rationale for Evaluation and Treatment: Rehabilitation  THERAPY DIAG:  Unsteadiness on feet  Muscle weakness (generalized)  Other low back pain  ONSET DATE: unsure  SUBJECTIVE:                                                                                                                                                                                           SUBJECTIVE STATEMENT: Not seeing much improvement. Went backwards into a chair last night after reaching up in medicine cabinet. Reports being dizziheaded today.   PERTINENT HISTORY:  Allergies, angina, diabetes, visual impairments, hard of hearing, history of myocardial infarction, arthritis, and history of cancer  PAIN:  Are you having pain? Yes: NPRS scale: no pain score provided/10 Pain location: "down my back" Pain  description: "weakness" Aggravating factors: walking, stand up straight  Relieving factors: bending over  PRECAUTIONS: Fall  PATIENT GOALS: improved strength, stand and walk longer (<5 minutes currently), and be able to stand upright while walking  NEXT MD VISIT: unsure  OBJECTIVE:   LUMBAR ROM:   AROM eval  Flexion Not assessed due to safety; stands at 14 degrees of lumbar flexion  Extension 2  Right lateral flexion   Left lateral flexion   Right rotation 50% limited required modA due to LOB  Left rotation 75% limited; required maxA due to LOB   (Blank rows = not tested)  LOWER EXTREMITY ROM: WFL for activities assessed  LOWER EXTREMITY MMT:    MMT Right eval Left eval  Hip flexion 4/5 4/5  Hip extension    Hip abduction    Hip adduction    Hip internal rotation    Hip external rotation    Knee flexion 4-/5 4-/5  Knee extension 5/5 4/5  Ankle dorsiflexion 3/5 3/5  Ankle plantarflexion    Ankle inversion    Ankle eversion     (Blank rows = not tested)  FUNCTIONAL TESTS:  5 times sit to stand: 16.51 seconds w/o UE support; required cueing throughout for upright posture Timed up and go (TUG): 19.91 seconds w/o assistive device; required close supervision Dynamic Gait Index: 4/25 Sit to stand transfers: required close supervision Stand to sit transfers: required close supervision with uncontrolled descent  TODAY'S TREATMENT:  DATE:                                     EXERCISE LOG  Exercise Repetitions and Resistance Comments  Nustep L2 x16 min   LAQ 4# 2x15 reps   Clam  Green x20 reps   Seated marching  4# x20 reps   Bridge X20 reps    Blank cell = exercise not performed today   Modalities  Date:  Unattended Estim: Lumbar, Pre-Mod, 15 mins, Pain  PATIENT EDUCATION:  Education details: POC, goals for therapy, safety Person  educated: Patient Education method: Explanation Education comprehension: verbalized understanding  HOME EXERCISE PROGRAM:  ASSESSMENT:  CLINICAL IMPRESSION: Patient presented in clinic with reports not much improvement regarding LBP and has been experiencing more dizziheadedness. Patient reports not taking his vertigo like medication prior to PT. Patient able to tolerate seated exercises in caution due to vertigo like symptoms. Normal stimulation response noted following removal of the modality. Patient cautioned with transfers due to symptoms.  OBJECTIVE IMPAIRMENTS: Abnormal gait, decreased activity tolerance, decreased balance, decreased mobility, difficulty walking, decreased ROM, decreased strength, postural dysfunction, and pain.   ACTIVITY LIMITATIONS: carrying, lifting, bending, standing, stairs, transfers, and locomotion level  PARTICIPATION LIMITATIONS: meal prep, cleaning, laundry, shopping, and community activity  PERSONAL FACTORS: Age, Fitness, Time since onset of injury/illness/exacerbation, and 3+ comorbidities: Allergies, angina, diabetes, visual impairments, hard of hearing, history of myocardial infarction, arthritis, and history of cancer  are also affecting patient's functional outcome.   REHAB POTENTIAL: Fair    CLINICAL DECISION MAKING: Unstable/unpredictable  EVALUATION COMPLEXITY: High  GOALS: Goals reviewed with patient? Yes  LONG TERM GOALS: Target date: 10/14/22  Patient will be independent with his HEP. Baseline:  Goal status: INITIAL  2.  Patient will improve his timed up and to 15 seconds or less for improved safety. Baseline:  Goal status: INITIAL  3.  Patient will improve his dynamic gait index score to at least 15/25 for improved safety and mobility. Baseline:  Goal status: INITIAL  4.  Patient will be able to safely navigate at least 4 steps for improved household mobility. Baseline:  Goal status: INITIAL  PLAN:  PT FREQUENCY:  2x/week  PT DURATION: 4 weeks  PLANNED INTERVENTIONS: Therapeutic exercises, Therapeutic activity, Neuromuscular re-education, Balance training, Gait training, Patient/Family education, Self Care, Joint mobilization, Stair training, Electrical stimulation, Spinal mobilization, Cryotherapy, Moist heat, Manual therapy, and Re-evaluation.  PLAN FOR NEXT SESSION: NuStep, lumbar and lower extremity strengthening, postural reeducation, balance interventions, and modalities as needed  Standley Brooking, PTA 10/02/2022, 11:58 AM

## 2022-10-03 DIAGNOSIS — M109 Gout, unspecified: Secondary | ICD-10-CM | POA: Diagnosis not present

## 2022-10-09 ENCOUNTER — Ambulatory Visit: Payer: Medicare Other

## 2022-10-09 DIAGNOSIS — R2681 Unsteadiness on feet: Secondary | ICD-10-CM | POA: Diagnosis not present

## 2022-10-09 DIAGNOSIS — M5459 Other low back pain: Secondary | ICD-10-CM

## 2022-10-09 DIAGNOSIS — M545 Low back pain, unspecified: Secondary | ICD-10-CM | POA: Diagnosis not present

## 2022-10-09 DIAGNOSIS — G8929 Other chronic pain: Secondary | ICD-10-CM | POA: Diagnosis not present

## 2022-10-09 DIAGNOSIS — M6281 Muscle weakness (generalized): Secondary | ICD-10-CM

## 2022-10-09 NOTE — Therapy (Signed)
OUTPATIENT PHYSICAL THERAPY THORACOLUMBAR TREATMENT   Patient Name: Michael Johnston MRN: [id number] DOB:1932-01-26, 89 y.o., male Today's Date: 10/09/2022  END OF SESSION:  PT End of Session - 10/09/22 1044     Visit Number 5    Number of Visits 8    Date for PT Re-Evaluation 12/06/22    PT Start Time 1030    PT Stop Time 1119    PT Time Calculation (min) 49 min    Activity Tolerance Patient tolerated treatment well    Behavior During Therapy [facility] for tasks assessed/performed            Past Medical History:  Diagnosis Date   Arthritis    "right shoulder" (05/24/2015)   CHEST PAIN    Chronic bronchitis ([place])    "get it ~ q yr" (05/24/2015)   COLONIC POLYPS, HX OF    COPD (chronic obstructive pulmonary disease) ([place])    "dx'd but I don't take RX for it" (05/24/2015)   Coronary artery disease    Cystic kidney disease    GERD (gastroesophageal reflux disease)    History of hiatal hernia    MI (myocardial infarction) ([place]) 09/2010   Mixed hyperlipidemia    Skin cancer    "top of my head only" (05/24/2015)   Sleep apnea    Syncope and collapse ~ 2013; 05/24/2015   Past Surgical History:  Procedure Laterality Date   CARDIAC CATHETERIZATION  09/2010   [place]   "partial"   CORONARY ANGIOPLASTY     CORONARY ARTERY BYPASS GRAFT  09/2010   Median sternotomy for coronary artery bypass grafting x3  (left internal mammary artery to distal left anterior  descending  coronary artery, saphenous vein graft to first diagonal branch,  saphenous vein graft to first  obtuse marginal branch, endoscopic  saphenous vein harvest from right thigh). SURGEON:  Valentina Gu.  Roxy Manns, MD  ASSISTANT:  John Giovanni, PA-C  ANESTHESIA:  Glynda Jaeger, MD    LEFT HEART CATH AND CORS/GRAFTS ANGIOGRAPHY N/A 08/22/2017   Procedure: LEFT HEART CATH AND CORS/GRAFTS ANGIOGRAPHY;  Surgeon: Martinique, Peter M, MD;  Location: [place] [facility];  Service: Cardiovascular;   Laterality: N/A;   SKIN CANCER EXCISION     "top of my head"   Patient Active Problem List   Diagnosis Date Noted   Pulmonary embolus ([place]) 02/08/2022   Combined forms of age-related cataract of left eye 11/10/2018   Combined forms of age-related cataract of right eye 11/03/2018   Intraoperative floppy iris syndrome (IFIS) 11/03/2018   Urine stream spraying 02/23/2018   Flank pain 02/22/2018   Chest pain 08/21/2017   Unstable angina ([place]) 08/21/2017   Renal cyst 12/20/2016   B12 deficiency 11/08/2016   Type 2 diabetes mellitus with neurological complications ([place]) 77/93/9030   Syncope and collapse 05/24/2015   Benign prostatic hyperplasia with urinary obstruction 12/12/2014   Carotid artery stenosis 05/22/2012   CAD (coronary artery disease) 05/14/2012   ED (erectile dysfunction) of organic origin 08/19/2011   FH: CABG (coronary artery bypass surgery) 01/28/2011   Mixed hyperlipidemia 10/23/2010   GERD (gastroesophageal reflux disease) 10/22/2010   History of colonic polyps 06/14/2010   REFERRING PROVIDER: Dorothyann Peng, NP   REFERRING DIAG: Chronic midline low back pain without sciatica   Rationale for Evaluation and Treatment: Rehabilitation  THERAPY DIAG:  Unsteadiness on feet  Muscle weakness (generalized)  Other low back pain  ONSET DATE: unsure  SUBJECTIVE:                                                                                                                                                                                           SUBJECTIVE STATEMENT: Pt denies any pain.  Pt reports increased dizziness today.   PERTINENT HISTORY:  Allergies, angina, diabetes, visual impairments, hard of hearing, history of myocardial infarction, arthritis, and history of cancer  PAIN:  Are you having pain? No  PRECAUTIONS: Fall  PATIENT GOALS: improved strength, stand and walk longer (<5 minutes currently), and be able to stand upright while walking  NEXT MD  VISIT: unsure  OBJECTIVE:   LUMBAR ROM:   AROM eval  Flexion Not assessed due to safety; stands at 14 degrees of lumbar flexion  Extension 2  Right lateral flexion   Left lateral flexion   Right rotation 50% limited required modA due to LOB  Left rotation 75% limited; required maxA due to LOB   (Blank rows = not tested)  LOWER EXTREMITY ROM: WFL for activities assessed  LOWER EXTREMITY MMT:    MMT Right eval Left eval  Hip flexion 4/5 4/5  Hip extension    Hip abduction    Hip adduction    Hip internal rotation    Hip external rotation    Knee flexion 4-/5 4-/5  Knee extension 5/5 4/5  Ankle dorsiflexion 3/5 3/5  Ankle plantarflexion    Ankle inversion    Ankle eversion     (Blank rows = not tested)  FUNCTIONAL TESTS:  5 times sit to stand: 16.51 seconds w/o UE support; required cueing throughout for upright posture Timed up and go (TUG): 19.91 seconds w/o assistive device; required close supervision Dynamic Gait Index: 4/25 Sit to stand transfers: required close supervision Stand to sit transfers: required close supervision with uncontrolled descent  TODAY'S TREATMENT:                                                                                                                              DATE:  10/09/22  EXERCISE LOG  Exercise Repetitions and Resistance Comments  Nustep L4 x 20 min   LAQ 4# 2x15 reps   Clam  Green x25 reps   [employer] X25 reps   Seated marching  4# x25 reps   Bridge X25 reps    Blank cell = exercise not performed today   Modalities  Date:  Unattended Estim: Lumbar, Pre-Mod, 10 mins, Pain   PATIENT EDUCATION:  Education details: POC, goals for therapy, safety Person educated: Patient Education method: Explanation Education comprehension: verbalized understanding  HOME EXERCISE PROGRAM:  ASSESSMENT:  CLINICAL IMPRESSION: Pt arrives for today's treatment session denying any pain, but does  endorse increased dizziness today.  Pt reports taking a "motion pill" before coming to treatment today.  Pt able to tolerate increased reps with all exercises today.  Pt reports increased fatigue.  Pt able to meet TUG goal today without issue. Normal responses to estim noted upon removal.  Pt denied any pain at completion of today's treatment session.   OBJECTIVE IMPAIRMENTS: Abnormal gait, decreased activity tolerance, decreased balance, decreased mobility, difficulty walking, decreased ROM, decreased strength, postural dysfunction, and pain.   ACTIVITY LIMITATIONS: carrying, lifting, bending, standing, stairs, transfers, and locomotion level  PARTICIPATION LIMITATIONS: meal prep, cleaning, laundry, shopping, and community activity  PERSONAL FACTORS: Age, Fitness, Time since onset of injury/illness/exacerbation, and 3+ comorbidities: Allergies, angina, diabetes, visual impairments, hard of hearing, history of myocardial infarction, arthritis, and history of cancer  are also affecting patient's functional outcome.   REHAB POTENTIAL: Fair    CLINICAL DECISION MAKING: Unstable/unpredictable  EVALUATION COMPLEXITY: High  GOALS: Goals reviewed with patient? Yes  LONG TERM GOALS: Target date: 10/14/22  Patient will be independent with his HEP. Baseline:  Goal status: IN PROGRESS  2.  Patient will improve his timed up and to 15 seconds or less for improved safety. Baseline: 1/31: 14.7 seconds Goal status: MET  3.  Patient will improve his dynamic gait index score to at least 15/25 for improved safety and mobility. Baseline:  Goal status: IN PROGRESS  4.  Patient will be able to safely navigate at least 4 steps for improved household mobility. Baseline:  Goal status: IN PROGRESS  PLAN:  PT FREQUENCY: 2x/week  PT DURATION: 4 weeks  PLANNED INTERVENTIONS: Therapeutic exercises, Therapeutic activity, Neuromuscular re-education, Balance training, Gait training, Patient/Family education,  Self Care, Joint mobilization, Stair training, Electrical stimulation, Spinal mobilization, Cryotherapy, Moist heat, Manual therapy, and Re-evaluation.  PLAN FOR NEXT SESSION: NuStep, lumbar and lower extremity strengthening, postural reeducation, balance interventions, and modalities as needed  Kathrynn Ducking, PTA 10/09/2022, 11:24 AM

## 2022-10-15 ENCOUNTER — Telehealth: Payer: Self-pay

## 2022-10-15 NOTE — Progress Notes (Signed)
Patient ID: Michael Johnston, male   DOB: 1932/04/04, 89 y.o.   MRN: [id number] Care Management & Coordination Services Pharmacy Team  Reason for Encounter: General adherence update   Contacted patient for general health update and medication adherence call.  Unsuccessful outreach. Left voicemail for patient to return call.   Chart Updates:  Recent office visits:  10/17/22 Lucretia Kern, DO - Patient presented for Medicare annual wellness visit via video. No medication changes.   09/11/22 Nafziger, Tommi Rumps, NP - Patient presented for Chronic midline low back pain without sciatica and other concerns. No medication changes.  08/21/22 Dorothyann Peng, NP - Patient presented for Routine general medical exam at a health care facility and other concerns. No medication changes.   Recent consult visits:  10/16/22 Kathrynn Ducking, PTA - Patient presented for Unsteadiness on feet and other concerns. No medication changes.  10/09/22 Kathrynn Ducking, PTA - Patient presented for Unsteadiness on feet and other concerns. No medication changes.   10/02/22 Standley Brooking, PTA - Patient presented for Unsteadiness on feet and other concerns. No medication changes.   09/26/22 Carin Hock, MD (Urology) - Patient presented for BPH and other concerns. No medication changes.   09/25/22 Standley Brooking, PTA - Patient presented for Unsteadiness on feet and other concerns. No medication changes.   09/20/22 Standley Brooking, PTA - Patient presented for Unsteadiness on feet and other concerns. No medication changes.   09/18/22 Harriett Rush, PA-C (Oncology) - Patient presented for Multiple subsegmental pulmonary emboli without acute cor pulmonale and other concerns. No medication changes.   09/16/22  Darlin Coco, PT - Patient presented for Unsteadiness on feet and other concerns. No medication changes.   Hospital visits:  None in previous 6 months  Medications: Outpatient Encounter  Medications as of 10/15/2022  Medication Sig   apixaban (ELIQUIS) 5 MG TABS tablet Take 1 tablet (5 mg total) by mouth 2 (two) times daily.   aspirin 81 MG tablet Take 81 mg by mouth daily.   atorvastatin (LIPITOR) 20 MG tablet TAKE 1 TABLET BY MOUTH EVERY DAY   cyanocobalamin (VITAMIN B12) 1000 MCG/ML injection INJECT 1 ML INTRAMUSCULARLY EVERY OTHER WEEK   diclofenac Sodium (VOLTAREN) 1 % GEL Apply 2 g topically 4 (four) times daily as needed (For pain).   EPINEPHrine 0.3 mg/0.3 mL IJ SOAJ injection    ketoconazole (NIZORAL) 2 % shampoo USE TWICE WEEKLY   metFORMIN (GLUCOPHAGE) 500 MG tablet TAKE 1 TABLET BY MOUTH EVERY DAY WITH BREAKFAST   metoprolol tartrate (LOPRESSOR) 25 MG tablet TAKE 1 TABLET BY MOUTH TWICE A DAY   nitroGLYCERIN (NITROSTAT) 0.4 MG SL tablet Place 1 tablet (0.4 mg total) under the tongue every 5 (five) minutes as needed for chest pain (3 doses max).   SYRINGE-NEEDLE, DISP, 3 ML (B-D 3CC LUER-LOK SYR 25GX1") 25G X 1" 3 ML MISC USE AS DIRECTED TO INJECT B12   No facility-administered encounter medications on file as of 10/15/2022.    Recent vitals BP Readings from Last 3 Encounters:  09/18/22 124/82  09/11/22 110/68  08/21/22 120/60   Pulse Readings from Last 3 Encounters:  09/18/22 61  09/11/22 64  08/21/22 65   Wt Readings from Last 3 Encounters:  09/18/22 169 lb 12.8 oz (77 kg)  09/11/22 173 lb (78.5 kg)  08/21/22 172 lb (78 kg)   BMI Readings from Last 3 Encounters:  09/18/22 24.54 kg/m  09/11/22 25.00 kg/m  08/21/22 24.86 kg/m  Recent lab results    Component Value Date/Time   NA 137 09/18/2022 1021   K 4.3 09/18/2022 1021   CL 101 09/18/2022 1021   CO2 27 09/18/2022 1021   GLUCOSE 133 (H) 09/18/2022 1021   BUN 24 (H) 09/18/2022 1021   CREATININE 0.92 09/18/2022 1021   CREATININE 0.78 07/14/2020 1003   CALCIUM 9.6 09/18/2022 1021    Lab Results  Component Value Date   CREATININE 0.92 09/18/2022   GFR 78.34 08/21/2022   GFRNONAA >60  09/18/2022   GFRAA 95 07/14/2020   Lab Results  Component Value Date/Time   HGBA1C 6.9 (H) 08/21/2022 09:05 AM   HGBA1C 6.8 (H) 02/07/2022 11:41 AM   HGBA1C 6.7 11/06/2018 09:25 AM   MICROALBUR <0.7 08/21/2022 09:05 AM   MICROALBUR <0.7 07/17/2021 09:35 AM    Lab Results  Component Value Date   CHOL 135 08/21/2022   HDL 70.60 08/21/2022   LDLCALC 51 08/21/2022   TRIG 64.0 08/21/2022   CHOLHDL 2 08/21/2022    Care Gaps: Covid - Overdue Zoster Vaccine - Overdue Eye Exam - Overdue AWV- 10/16/21  Star Rating Drugs:  Atorvastatin 20 mg - Last filled 08/20/22 90 DS at [facility] Metformin 500 mg - Last filled 08/20/22 90 DS at [place] Pharmacist Assistant [phone]

## 2022-10-16 ENCOUNTER — Ambulatory Visit: Payer: Medicare Other | Attending: Adult Health

## 2022-10-16 DIAGNOSIS — M5459 Other low back pain: Secondary | ICD-10-CM | POA: Insufficient documentation

## 2022-10-16 DIAGNOSIS — R2681 Unsteadiness on feet: Secondary | ICD-10-CM | POA: Diagnosis not present

## 2022-10-16 DIAGNOSIS — M6281 Muscle weakness (generalized): Secondary | ICD-10-CM | POA: Diagnosis not present

## 2022-10-16 NOTE — Therapy (Signed)
OUTPATIENT PHYSICAL THERAPY THORACOLUMBAR TREATMENT   Patient Name: Michael Johnston MRN: [id number] DOB:03/30/1932, 89 y.o., male Today's Date: 10/16/2022  END OF SESSION:  PT End of Session - 10/16/22 1036     Visit Number 6    Number of Visits 8    Date for PT Re-Evaluation 12/06/22    PT Start Time 1034    PT Stop Time 4696    PT Time Calculation (min) 49 min    Activity Tolerance Patient tolerated treatment well    Behavior During Therapy [facility] for tasks assessed/performed            Past Medical History:  Diagnosis Date   Arthritis    "right shoulder" (05/24/2015)   CHEST PAIN    Chronic bronchitis ([place])    "get it ~ q yr" (05/24/2015)   COLONIC POLYPS, HX OF    COPD (chronic obstructive pulmonary disease) ([place])    "dx'd but I don't take RX for it" (05/24/2015)   Coronary artery disease    Cystic kidney disease    GERD (gastroesophageal reflux disease)    History of hiatal hernia    MI (myocardial infarction) ([place]) 09/2010   Mixed hyperlipidemia    Skin cancer    "top of my head only" (05/24/2015)   Sleep apnea    Syncope and collapse ~ 2013; 05/24/2015   Past Surgical History:  Procedure Laterality Date   CARDIAC CATHETERIZATION  09/2010   [place]   "partial"   CORONARY ANGIOPLASTY     CORONARY ARTERY BYPASS GRAFT  09/2010   Median sternotomy for coronary artery bypass grafting x3  (left internal mammary artery to distal left anterior  descending  coronary artery, saphenous vein graft to first diagonal branch,  saphenous vein graft to first  obtuse marginal branch, endoscopic  saphenous vein harvest from right thigh). SURGEON:  Valentina Gu.  Roxy Manns, MD  ASSISTANT:  John Giovanni, PA-C  ANESTHESIA:  Glynda Jaeger, MD    LEFT HEART CATH AND CORS/GRAFTS ANGIOGRAPHY N/A 08/22/2017   Procedure: LEFT HEART CATH AND CORS/GRAFTS ANGIOGRAPHY;  Surgeon: Martinique, Peter M, MD;  Location: [place] [facility];  Service: Cardiovascular;   Laterality: N/A;   SKIN CANCER EXCISION     "top of my head"   Patient Active Problem List   Diagnosis Date Noted   Pulmonary embolus ([place]) 02/08/2022   Combined forms of age-related cataract of left eye 11/10/2018   Combined forms of age-related cataract of right eye 11/03/2018   Intraoperative floppy iris syndrome (IFIS) 11/03/2018   Urine stream spraying 02/23/2018   Flank pain 02/22/2018   Chest pain 08/21/2017   Unstable angina ([place]) 08/21/2017   Renal cyst 12/20/2016   B12 deficiency 11/08/2016   Type 2 diabetes mellitus with neurological complications ([place]) 29/52/8413   Syncope and collapse 05/24/2015   Benign prostatic hyperplasia with urinary obstruction 12/12/2014   Carotid artery stenosis 05/22/2012   CAD (coronary artery disease) 05/14/2012   ED (erectile dysfunction) of organic origin 08/19/2011   FH: CABG (coronary artery bypass surgery) 01/28/2011   Mixed hyperlipidemia 10/23/2010   GERD (gastroesophageal reflux disease) 10/22/2010   History of colonic polyps 06/14/2010   REFERRING PROVIDER: Dorothyann Peng, NP   REFERRING DIAG: Chronic midline low back pain without sciatica   Rationale for Evaluation and Treatment: Rehabilitation  THERAPY DIAG:  Unsteadiness on feet  Muscle weakness (generalized)  Other low back pain  ONSET DATE: unsure  SUBJECTIVE:                                                                                                                                                                                           SUBJECTIVE STATEMENT: Pt denies any pain.  Pt reports mild dizziness.  Reports falling this past Thursday taking trash out.  Minor injuries reported.   PERTINENT HISTORY:  Allergies, angina, diabetes, visual impairments, hard of hearing, history of myocardial infarction, arthritis, and history of cancer  PAIN:  Are you having pain? No  PRECAUTIONS: Fall  PATIENT GOALS: improved strength, stand and walk longer (<5  minutes currently), and be able to stand upright while walking  NEXT MD VISIT: unsure  OBJECTIVE:   LUMBAR ROM:   AROM eval  Flexion Not assessed due to safety; stands at 14 degrees of lumbar flexion  Extension 2  Right lateral flexion   Left lateral flexion   Right rotation 50% limited required modA due to LOB  Left rotation 75% limited; required maxA due to LOB   (Blank rows = not tested)  LOWER EXTREMITY ROM: WFL for activities assessed  LOWER EXTREMITY MMT:    MMT Right eval Left eval  Hip flexion 4/5 4/5  Hip extension    Hip abduction    Hip adduction    Hip internal rotation    Hip external rotation    Knee flexion 4-/5 4-/5  Knee extension 5/5 4/5  Ankle dorsiflexion 3/5 3/5  Ankle plantarflexion    Ankle inversion    Ankle eversion     (Blank rows = not tested)  FUNCTIONAL TESTS:  5 times sit to stand: 16.51 seconds w/o UE support; required cueing throughout for upright posture Timed up and go (TUG): 19.91 seconds w/o assistive device; required close supervision Dynamic Gait Index: 4/25 Sit to stand transfers: required close supervision Stand to sit transfers: required close supervision with uncontrolled descent  TODAY'S TREATMENT:  DATE:  10/16/22                                   EXERCISE LOG  Exercise Repetitions and Resistance Comments  Nustep L4 x 20 min   LAQ 5# x20 reps   Clam  Green x2 mins   Ball Squeezes X2 mins   Seated marching  5# x20 reps   Ham Curls Green x 20 reps   Bridge X30 reps    Blank cell = exercise not performed today   Modalities  Date:  Unattended Estim: Lumbar, Pre-Mod, 10 mins, Pain   PATIENT EDUCATION:  Education details: POC, goals for therapy, safety Person educated: Patient Education method: Explanation Education comprehension: verbalized understanding  HOME EXERCISE  PROGRAM:  ASSESSMENT:  CLINICAL IMPRESSION: Pt arrives for today's treatment session denying any pain. Pt able to tolerate increased weight with LAQ and seated marches with fatigue noted.  Pt also able to tolerate increased time and reps with all other exercises today.  Normal responses to estim and MH noted upon removal.  Pt denied any pain at completion of today's treatment session.   OBJECTIVE IMPAIRMENTS: Abnormal gait, decreased activity tolerance, decreased balance, decreased mobility, difficulty walking, decreased ROM, decreased strength, postural dysfunction, and pain.   ACTIVITY LIMITATIONS: carrying, lifting, bending, standing, stairs, transfers, and locomotion level  PARTICIPATION LIMITATIONS: meal prep, cleaning, laundry, shopping, and community activity  PERSONAL FACTORS: Age, Fitness, Time since onset of injury/illness/exacerbation, and 3+ comorbidities: Allergies, angina, diabetes, visual impairments, hard of hearing, history of myocardial infarction, arthritis, and history of cancer  are also affecting patient's functional outcome.   REHAB POTENTIAL: Fair    CLINICAL DECISION MAKING: Unstable/unpredictable  EVALUATION COMPLEXITY: High  GOALS: Goals reviewed with patient? Yes  LONG TERM GOALS: Target date: 10/14/22  Patient will be independent with his HEP. Baseline:  Goal status: IN PROGRESS  2.  Patient will improve his timed up and to 15 seconds or less for improved safety. Baseline: 1/31: 14.7 seconds Goal status: MET  3.  Patient will improve his dynamic gait index score to at least 15/25 for improved safety and mobility. Baseline:  Goal status: IN PROGRESS  4.  Patient will be able to safely navigate at least 4 steps for improved household mobility. Baseline:  Goal status: IN PROGRESS  PLAN:  PT FREQUENCY: 2x/week  PT DURATION: 4 weeks  PLANNED INTERVENTIONS: Therapeutic exercises, Therapeutic activity, Neuromuscular re-education, Balance training,  Gait training, Patient/Family education, Self Care, Joint mobilization, Stair training, Electrical stimulation, Spinal mobilization, Cryotherapy, Moist heat, Manual therapy, and Re-evaluation.  PLAN FOR NEXT SESSION: NuStep, lumbar and lower extremity strengthening, postural reeducation, balance interventions, and modalities as needed  Kathrynn Ducking, PTA 10/16/2022, 11:24 AM

## 2022-10-17 ENCOUNTER — Telehealth (INDEPENDENT_AMBULATORY_CARE_PROVIDER_SITE_OTHER): Payer: Medicare Other | Admitting: Family Medicine

## 2022-10-17 DIAGNOSIS — Z0000 Encounter for general adult medical examination without abnormal findings: Secondary | ICD-10-CM

## 2022-10-17 NOTE — Patient Instructions (Addendum)
I really enjoyed getting to talk with you today! I am available on Tuesdays and Thursdays for virtual visits if you have any questions or concerns, or if I can be of any further assistance.   CHECKLIST FROM ANNUAL WELLNESS VISIT:  -Follow up (please call to schedule if not scheduled after visit):  -Inperson visit with your Primary Doctor office: -yearly for annual wellness visit with primary care office  Here is a list of your preventive care/health maintenance measures and the plan for each if any are due:  Health Maintenance  Topic Date Due   OPHTHALMOLOGY EXAM  12/13/2021   COVID-19 Vaccine (1) Can get at pharmacy if you change your mind.   Zoster Vaccines- Shingrix (1 of 2) Can get at pharmacy if you change your mind.    HEMOGLOBIN A1C  02/20/2023   FOOT EXAM  08/22/2023   DTaP/Tdap/Td (3 - Tdap) 09/15/2023   Medicare Annual Wellness (AWV)  10/18/2023   Pneumonia Vaccine 22+ Years old  Completed   INFLUENZA VACCINE  Completed   HPV VACCINES  Aged Out    -See a dentist at least yearly  -Get your eyes checked and then per your eye specialist's recommendations  -Other issues addressed today:  -I have included below further information regarding a healthy whole foods based diet, physical activity guidelines for adults, stress management and opportunities for social connections. I hope you find this information useful.     NUTRITION: -eat real food: lots of colorful vegetables (half the plate) and fruits -5-7 servings of vegetables and fruits per day (fresh or steamed is best), exp. 2 servings of vegetables with lunch and dinner and 2 servings of fruit per day. Berries and greens such as kale and collards are great choices.  -consume  on a regular basis: whole grains (make sure first ingredient on label contains the word "whole"), fresh fruits, fish, nuts, seeds, healthy oils (such as olive oil, avocado oil, grape seed oil) -may eat small amounts of dairy and lean meat on occasion, but avoid processed meats such as ham, bacon, lunch meat, etc. -drink water -try to avoid fast food and pre-packaged foods, processed meat -most experts advise limiting sodium to < '2300mg'$  per day, should limit further is any chronic conditions such as high blood pressure, heart disease, diabetes, etc. The American Heart Association advised that < '1500mg'$  is is ideal -try to avoid foods that contain any ingredients with names you do not recognize  -try to avoid sugar/sweets (except for the natural sugar that occurs in fresh fruit) -try to avoid sweet drinks -try to avoid white rice, white bread, pasta (unless whole grain), white or yellow potatoes  EXERCISE GUIDELINES FOR ADULTS: -if you wish to increase your physical activity, do so gradually and with the approval of your doctor -STOP and seek medical care immediately if you have any chest pain, chest discomfort or trouble breathing when starting or increasing exercise  -move and stretch your body, legs, feet and arms when sitting for long periods -Physical activity guidelines for optimal health in adults: -least 150 minutes per week of aerobic exercise (can talk, but not sing) once approved by your doctor, 20-30 minutes of sustained activity or two 10 minute episodes of sustained activity every day.  -resistance training at least 2 days per week if approved by your doctor -balance exercises 3+ days per week:   Stand somewhere where you have something sturdy to hold onto if you lose balance.    1) lift up on  toes, start with 5x per day and work up to 20x   2) stand and lift on leg straight out to the side so that foot is a few inches of the floor, start with 5x each side and work up to 20x each  side   3) stand on one foot, start with 5 seconds each side and work up to 20 seconds on each side  If you need ideas or help with getting more active:  -Silver sneakers https://tools.silversneakers.com  -Walk with a Doc: http://stephens-thompson.biz/  -try to include resistance (weight lifting/strength building) and balance exercises twice per week: or the following link for ideas: ChessContest.fr  UpdateClothing.com.cy  STRESS MANAGEMENT: -can try meditating, or just sitting quietly with deep breathing while intentionally relaxing all parts of your body for 5 minutes daily -if you need further help with stress, anxiety or depression please follow up with your primary doctor or contact the wonderful folks at [place]: [place]: -options in [place] if you wish to engage in more social and exercise related activities:  -Silver sneakers https://tools.silversneakers.com  -Walk with a Doc: http://stephens-thompson.biz/  -Check out the [place] 50+ section on the [place] [employer] (hiking clubs, book clubs, cards and games, chess, exercise classes, aquatic classes and much more) - see the website for details: https://www.[place].gov/departments/parks-recreation/active-adults50  -YouTube has lots of exercise videos for different ages and abilities as well  -[place] (a variety of indoor and outdoor inperson activities for adults). (307)301-6562. [address]  -Virtual Online Classes (a variety of topics): see seniorplanet.org or call [phone]  -consider volunteering at a school, hospice center, church, senior center or elsewhere

## 2022-10-17 NOTE — Progress Notes (Signed)
PATIENT CHECK-IN and HEALTH RISK ASSESSMENT QUESTIONNAIRE:  -completed by phone/video for upcoming Medicare Preventive Visit  Pre-Visit Check-in: 1)Vitals (height, wt, BP, etc) - record in vitals section for visit on day of visit 2)Review and Update Medications, Allergies PMH, Surgeries, Social history in Epic 3)Hospitalizations in the last year with date/reason?  Yes-1 times blood clots bilateral legs 4)Review and Update Care Team (patient's specialists) in Epic 5) Complete PHQ9 in Epic  6) Complete Fall Screening in Epic 7)Review all Health Maintenance Due and order under PCP if not done.  Medicare Wellness Patient Questionnaire:  Answer theses question about your habits: Do you drink alcohol?  No If yes, how many drinks do you have a day?N/A Have you ever smoked?Yes Quit date if applicable?  Last smoked 68 years  How many packs a day do/did you smoke? No Do you use smokeless tobacco?No Do you use an illicit drugs?No Do you exercises? Yes IF so, what type and how many days/minutes per week?currently in physical therapy. Rides the stationary bike some. Doing some leg exercise.  Are you sexually active? No Number of partners?0 Eats at home, eats fruits and veggies daily Typical breakfast: fried egg, bread Typical lunch: sandwich Typical dinner: chicken, fish, beans, greens Typical snacks:sweet potatoes, cheese toast  Beverages: coffee, water, sometimes milk  Answer theses question about you: Can you perform most household chores?Yes Do you find it hard to follow a conversation in a noisy room?No Do you often ask people to speak up or repeat themselves? Some, has had audiology evaluation, he is in the process of looking into getting hearing aids. Do you feel that you have a problem with memory?Just a little bit Do you balance your checkbook and or bank acounts?No wife handles this Do you feel safe at home?Yes Last dentist visit?6 months Do you need assistance with any of the  following: Please note if so No assistance needed  Driving?  Feeding yourself?  Getting from bed to chair?  Getting to the toilet?  Bathing or showering?  Dressing yourself?  Managing money?  Climbing a flight of stairs  Preparing meals?    Do you have Advanced Directives in place (Living Will, Healthcare Power or Attorney)? Yes   Last eye Exam and location?May 2023 in [place], [place]   Do you currently use prescribed or non-prescribed narcotic or opioid pain medications?None  Do you have a history or close family history of breast, ovarian, tubal or peritoneal cancer or a family member with BRCA (breast cancer susceptibility 1 and 2) gene mutations?  Nurse/Assistant Credentials/time stamp:   ----------------------------------------------------------------------------------------------------------------------------------------------------------------------------------------------------------------------    MEDICARE ANNUAL PREVENTIVE CARE VISIT WITH PROVIDER (Welcome to Medicare, initial annual wellness or annual wellness exam)  Virtual Visit via Phone Note  I connected with [place]  on 10/17/22 by phone and verified that I am speaking with the correct person using two identifiers.  Location patient: home Location provider:work or home office Persons participating in the virtual visit: patient, provider  Concerns and/or follow up today: back is doing better. Seeing his dermatologist for an issue with an infection in the skin on hand around a finger nail. No doing better.    See HM section in Epic for other details of completed HM.    ROS: negative for report of fevers, unintentional weight loss, vision changes, vision loss, hearing loss or change, chest pain, sob, hemoptysis, melena, hematochezia, hematuria, genital discharge or lesions, falls, bleeding or bruising, loc, thoughts of suicide or self harm, memory loss  Patient-completed extensive  health risk assessment -  reviewed and discussed with the patient: See Health Risk Assessment completed with patient prior to the visit either above or in recent phone note. This was reviewed in detailed with the patient today and appropriate recommendations, orders and referrals were placed as needed per Summary below and patient instructions.   Review of Medical History: -PMH, [place], Family History and current specialty and care providers reviewed and updated and listed below   Patient Care Team: Dorothyann Peng, NP as PCP - General (Family Medicine) Viona Gilmore, [facility] (Inactive) as Pharmacist (Pharmacist)   Past Medical History:  Diagnosis Date   Arthritis    "right shoulder" (05/24/2015)   CHEST PAIN    Chronic bronchitis ([place])    "get it ~ q yr" (05/24/2015)   COLONIC POLYPS, HX OF    COPD (chronic obstructive pulmonary disease) ([place])    "dx'd but I don't take RX for it" (05/24/2015)   Coronary artery disease    Cystic kidney disease    GERD (gastroesophageal reflux disease)    History of hiatal hernia    MI (myocardial infarction) ([place]) 09/2010   Mixed hyperlipidemia    Skin cancer    "top of my head only" (05/24/2015)   Sleep apnea    Syncope and collapse ~ 2013; 05/24/2015    Past Surgical History:  Procedure Laterality Date   CARDIAC CATHETERIZATION  09/2010   [place]   "partial"   CORONARY ANGIOPLASTY     CORONARY ARTERY BYPASS GRAFT  09/2010   Median sternotomy for coronary artery bypass grafting x3  (left internal mammary artery to distal left anterior  descending  coronary artery, saphenous vein graft to first diagonal branch,  saphenous vein graft to first  obtuse marginal branch, endoscopic  saphenous vein harvest from right thigh). SURGEON:  Valentina Gu.  Roxy Manns, MD  ASSISTANT:  John Giovanni, PA-C  ANESTHESIA:  Glynda Jaeger, MD    LEFT HEART CATH AND CORS/GRAFTS ANGIOGRAPHY N/A 08/22/2017   Procedure: LEFT HEART CATH AND CORS/GRAFTS ANGIOGRAPHY;   Surgeon: Martinique, Peter M, MD;  Location: [facility];  Service: Cardiovascular;  Laterality: N/A;   SKIN CANCER EXCISION     "top of my head"    Social History   Socioeconomic History   Marital status: Married    Spouse name: Not on file   Number of children: Not on file   Years of education: Not on file   Highest education level: Not on file  Occupational History   Not on file  Tobacco Use   Smoking status: Former    Packs/day: 1.50    Years: 3.00    Total pack years: 4.50    Types: Cigarettes    Quit date: 12/09/1951    Years since quitting: 70.9   Smokeless tobacco: Never  Vaping Use   Vaping Use: Never used  Substance and Sexual Activity   Alcohol use: Not Currently    Comment: "no alcohol since 1953"   Drug use: No   Sexual activity: Not on file  Other Topics Concern   Not on file  Social History Narrative   Retired    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[place] Strain: [place]  (10/16/2021)   Overall Financial Resource Strain (CARDIA)    Difficulty of Paying Living Expenses: Not hard at all  Food Insecurity: No Food  Insecurity (10/16/2021)   Hunger Vital Sign    Worried About Running Out of Food in the Last Year: Never true    Ran Out of Food in the Last Year: Never true  Transportation Needs: No Transportation Needs (10/16/2021)   PRAPARE - Hydrologist (Medical): No    Lack of Transportation (Non-Medical): No  Physical Activity: Insufficiently Active (10/16/2021)   Exercise Vital Sign    Days of Exercise per Week: 2 days    Minutes of Exercise per Session: 40 min  Stress: No Stress Concern Present (10/16/2021)   [place]    Feeling of Stress : Not at all  Social Connections: [place] (10/16/2021)   Social Connection and Isolation Panel [NHANES]    Frequency of Communication with Friends and Family: More  than three times a week    Frequency of Social Gatherings with Friends and Family: More than three times a week    Attends Religious Services: More than 4 times per year    Active Member of [employer] or Organizations: Yes    Attends Music therapist: More than 4 times per year    Marital Status: Married  Human resources officer Violence: Not At Risk (10/16/2021)   Humiliation, Afraid, Rape, and Kick questionnaire    Fear of Current or Ex-Partner: No    Emotionally Abused: No    Physically Abused: No    Sexually Abused: No    Family History  Problem Relation Age of Onset   Lung cancer Sister    Heart attack Father     Current Outpatient Medications on File Prior to Visit  Medication Sig Dispense Refill   apixaban (ELIQUIS) 5 MG TABS tablet Take 1 tablet (5 mg total) by mouth 2 (two) times daily. 180 tablet 3   aspirin 81 MG tablet Take 81 mg by mouth daily.     atorvastatin (LIPITOR) 20 MG tablet TAKE 1 TABLET BY MOUTH EVERY DAY 90 tablet 3   cyanocobalamin (VITAMIN B12) 1000 MCG/ML injection INJECT 1 ML INTRAMUSCULARLY EVERY OTHER WEEK 3 mL 6   diclofenac Sodium (VOLTAREN) 1 % GEL Apply 2 g topically 4 (four) times daily as needed (For pain). 150 g 2   EPINEPHrine 0.3 mg/0.3 mL IJ SOAJ injection      ketoconazole (NIZORAL) 2 % shampoo USE TWICE WEEKLY 120 mL 3   metFORMIN (GLUCOPHAGE) 500 MG tablet TAKE 1 TABLET BY MOUTH EVERY DAY WITH BREAKFAST 90 tablet 1   metoprolol tartrate (LOPRESSOR) 25 MG tablet TAKE 1 TABLET BY MOUTH TWICE A DAY 180 tablet 3   nitroGLYCERIN (NITROSTAT) 0.4 MG SL tablet Place 1 tablet (0.4 mg total) under the tongue every 5 (five) minutes as needed for chest pain (3 doses max). 25 tablet 1   SYRINGE-NEEDLE, DISP, 3 ML (B-D 3CC LUER-LOK SYR 25GX1") 25G X 1" 3 ML MISC USE AS DIRECTED TO INJECT B12 3 each 3   No current facility-administered medications on file prior to visit.    Allergies  Allergen Reactions   Levaquin [Levofloxacin In D5w] Swelling and  Other (See Comments)    Eyes swollen   Other Anaphylaxis and Other (See Comments)    Bee sting   Ivp Dye [Iodinated Contrast Media] Other (See Comments)    Patient reports seizure   Metformin And Related Nausea Only       Physical Exam There were no vitals filed for this visit. Estimated body mass  index is 24.54 kg/m as calculated from the following:   Height as of 09/18/22: 5' 9.75" (1.772 m).   Weight as of 09/18/22: 169 lb 12.8 oz (77 kg).  EKG (optional): deferred due to virtual visit  GENERAL: alert, oriented, no acute distress detected; full vision exam deferred due to pandemic and/or virtual encounter   PSYCH/NEURO: pleasant and cooperative, no obvious depression or anxiety, speech and thought processing grossly intact, Cognitive function grossly intact  Flowsheet Row Video Visit from 10/17/2022 in [place] [facility]  PHQ-9 Total Score 1           10/17/2022   12:38 PM 08/21/2022    8:24 AM 10/29/2021    3:14 PM 10/16/2021    1:07 PM 11/07/2020   11:24 AM  Depression screen PHQ 2/9  Decreased Interest 0 0 0 0 0  Down, Depressed, Hopeless 0 0 0 0 0  PHQ - 2 Score 0 0 0 0 0  Altered sleeping 0  0    Tired, decreased energy 1      Change in appetite 0  0    Feeling bad or failure about yourself  0  0    Trouble concentrating 0  0    Moving slowly or fidgety/restless 0  0    Suicidal thoughts 0  0    PHQ-9 Score 1  0    Difficult doing work/chores Not difficult at all           02/11/2022   12:29 AM 03/04/2022    8:33 AM 08/21/2022    8:21 AM 09/18/2022   10:50 AM 10/17/2022   12:37 PM  Fall Risk  Falls in the past year?   1  1  Was there an injury with Fall?   0  1  Fall Risk Category Calculator   2  3  Fall Risk Category (Retired)   Moderate    (RETIRED) Patient Fall Risk Level Low fall risk Low fall risk Moderate fall risk Low fall risk   Patient at Risk for Falls Due to   History of fall(s)    Fall risk Follow up   Falls evaluation  completed       SUMMARY AND PLAN:  Medicare annual wellness visit, subsequent   Discussed applicable health maintenance/preventive health measures and advised and referred or ordered per patient preferences: He does not wish to do the covid or shinrix vaccines and asked that I defer in the system.  Health Maintenance  Topic Date Due   OPHTHALMOLOGY EXAM  12/13/2021   COVID-19 Vaccine (1) 11/03/2027 (Originally 12/29/1932)   Zoster Vaccines- Shingrix (1 of 2) 01/15/2028 (Originally 06/29/1984)   HEMOGLOBIN A1C  02/20/2023   FOOT EXAM  08/22/2023   DTaP/Tdap/Td (3 - Tdap) 09/15/2023   Medicare Annual Wellness (AWV)  10/18/2023   Pneumonia Vaccine 53+ Years old  Completed   INFLUENZA VACCINE  Completed   HPV VACCINES  Aged Out    Education and counseling on the following was provided based on the above review of health and a plan/checklist for the patient, along with additional information discussed, was provided for the patient in the patient instructions :  -discussed hearing issues, he is working on getting hearing aids -congratulate don exercise he is doing, discussed guidelines and options - summarized in pt instructions -Advised and counseled on a whole foods based healthy diet and regular exercise: discussed a heart healthy whole foods based diet at length. A summary of a  healthy diet was provided in the Patient Instructions.-Advise yearly dental visits at minimum and regular eye exams. He reports he had his diabetic eye exam - but it is not in the chart. Will send message to staff to have them try to obtain to update record.  -Advised and counseled on alcohol, tobacco, drug, opoid use/misuse if applicable and options for help if applicable.  Follow up: see patient instructions   Patient Instructions  I really enjoyed getting to talk with you today! I am available on Tuesdays and Thursdays for virtual visits if you have any questions or concerns, or if I can be of any further  assistance.   CHECKLIST FROM ANNUAL WELLNESS VISIT:  -Follow up (please call to schedule if not scheduled after visit):  -Inperson visit with your Primary Doctor office: -yearly for annual wellness visit with primary care office  Here is a list of your preventive care/health maintenance measures and the plan for each if any are due:  Health Maintenance  Topic Date Due   OPHTHALMOLOGY EXAM  12/13/2021   COVID-19 Vaccine (1) Can get at pharmacy if you change your mind.   Zoster Vaccines- Shingrix (1 of 2) Can get at pharmacy if you change your mind.    HEMOGLOBIN A1C  02/20/2023   FOOT EXAM  08/22/2023   DTaP/Tdap/Td (3 - Tdap) 09/15/2023   Medicare Annual Wellness (AWV)  10/18/2023   Pneumonia Vaccine 98+ Years old  Completed   INFLUENZA VACCINE  Completed   HPV VACCINES  Aged Out    -See a dentist at least yearly  -Get your eyes checked and then per your eye specialist's recommendations  -Other issues addressed today:  -I have included below further information regarding a healthy whole foods based diet, physical activity guidelines for adults, stress management and opportunities for social connections. I hope you find this information useful.     NUTRITION: -eat real food: lots of colorful vegetables (half the plate) and fruits -5-7 servings of vegetables and fruits per day (fresh or steamed is best), exp. 2 servings of vegetables with lunch and dinner and 2 servings of fruit per day. Berries and greens such as kale and collards are great choices.  -consume on a regular basis: whole grains (make sure first ingredient on label contains the word "whole"), fresh fruits, fish, nuts, seeds, healthy oils (such as olive oil, avocado oil,  grape seed oil) -may eat small amounts of dairy and lean meat on occasion, but avoid processed meats such as ham, bacon, lunch meat, etc. -drink water -try to avoid fast food and pre-packaged foods, processed meat -most experts advise limiting sodium to < '2300mg'$  per day, should limit further is any chronic conditions such as high blood pressure, heart disease, diabetes, etc. The American Heart Association advised that < '1500mg'$  is is ideal -try to avoid foods that contain any ingredients with names you do not recognize  -try to avoid sugar/sweets (except for the natural sugar that occurs in fresh fruit) -try to avoid sweet drinks -try to avoid white rice, white bread, pasta (unless whole grain), white or yellow potatoes  EXERCISE GUIDELINES FOR ADULTS: -if you wish to increase your physical activity, do so gradually and with the approval of your doctor -STOP and seek medical care immediately if you have any chest pain, chest discomfort or trouble breathing when starting or increasing exercise  -move and stretch your body, legs, feet and arms when sitting for long periods -Physical activity guidelines for optimal health in  adults: -least 150 minutes per week of aerobic exercise (can talk, but not sing) once approved by your doctor, 20-30 minutes of sustained activity or two 10 minute episodes of sustained activity every day.  -resistance training at least 2 days per week if approved by your doctor -balance exercises 3+ days per week:   Stand somewhere where you have something sturdy to hold onto if you lose balance.    1) lift up on toes, start with 5x per day and work up to 20x   2) stand and lift on leg straight out to the side so that foot is a few inches of the floor, start with 5x each side and work up to 20x each side   3) stand on one foot, start with 5 seconds each side and work up to 20 seconds on each side  If you need ideas or help with getting more active:  -Silver  sneakers https://tools.silversneakers.com  -Walk with a Doc: http://stephens-thompson.biz/  -try to include resistance (weight lifting/strength building) and balance exercises twice per week: or the following link for ideas: ChessContest.fr  UpdateClothing.com.cy  STRESS MANAGEMENT: -can try meditating, or just sitting quietly with deep breathing while intentionally relaxing all parts of your body for 5 minutes daily -if you need further help with stress, anxiety or depression please follow up with your primary doctor or contact the wonderful folks at [place]: [place]: -options in [place] if you wish to engage in more social and exercise related activities:  -Silver sneakers https://tools.silversneakers.com  -Walk with a Doc: http://stephens-thompson.biz/  -Check out the [place] 50+ section on the [place] [employer] (hiking clubs, book clubs, cards and games, chess, exercise classes, aquatic classes and much more) - see the website for details: https://www.[place].gov/departments/parks-recreation/active-adults50  -YouTube has lots of exercise videos for different ages and abilities as well  -[place] (a variety of indoor and outdoor inperson activities for adults). 252-148-8942. [address]  -Virtual Online Classes (a variety of topics): see seniorplanet.org or call ([phone]  -consider volunteering at a school, hospice center, church, senior center or elsewhere           Lucretia Kern, DO

## 2022-10-23 ENCOUNTER — Ambulatory Visit: Payer: Medicare Other

## 2022-10-23 DIAGNOSIS — M6281 Muscle weakness (generalized): Secondary | ICD-10-CM | POA: Diagnosis not present

## 2022-10-23 DIAGNOSIS — R2681 Unsteadiness on feet: Secondary | ICD-10-CM

## 2022-10-23 DIAGNOSIS — M5459 Other low back pain: Secondary | ICD-10-CM | POA: Diagnosis not present

## 2022-10-23 NOTE — Therapy (Signed)
OUTPATIENT PHYSICAL THERAPY THORACOLUMBAR TREATMENT   Patient Name: Michael Johnston MRN: [id number] DOB:1932-01-04, 89 y.o., male Today's Date: 10/23/2022  END OF SESSION:  PT End of Session - 10/23/22 1036     Visit Number 7    Number of Visits 8    Date for PT Re-Evaluation 12/06/22    PT Start Time 1032    PT Stop Time 1109    PT Time Calculation (min) 37 min    Activity Tolerance Patient tolerated treatment well    Behavior During Therapy [facility] for tasks assessed/performed            Past Medical History:  Diagnosis Date   Arthritis    "right shoulder" (05/24/2015)   CHEST PAIN    Chronic bronchitis ([place])    "get it ~ q yr" (05/24/2015)   COLONIC POLYPS, HX OF    COPD (chronic obstructive pulmonary disease) ([place])    "dx'd but I don't take RX for it" (05/24/2015)   Coronary artery disease    Cystic kidney disease    GERD (gastroesophageal reflux disease)    History of hiatal hernia    MI (myocardial infarction) ([place]) 09/2010   Mixed hyperlipidemia    Skin cancer    "top of my head only" (05/24/2015)   Sleep apnea    Syncope and collapse ~ 2013; 05/24/2015   Past Surgical History:  Procedure Laterality Date   CARDIAC CATHETERIZATION  09/2010   [place]   "partial"   CORONARY ANGIOPLASTY     CORONARY ARTERY BYPASS GRAFT  09/2010   Median sternotomy for coronary artery bypass grafting x3  (left internal mammary artery to distal left anterior  descending  coronary artery, saphenous vein graft to first diagonal branch,  saphenous vein graft to first  obtuse marginal branch, endoscopic  saphenous vein harvest from right thigh). SURGEON:  Valentina Gu.  Roxy Manns, MD  ASSISTANT:  John Giovanni, PA-C  ANESTHESIA:  Glynda Jaeger, MD    LEFT HEART CATH AND CORS/GRAFTS ANGIOGRAPHY N/A 08/22/2017   Procedure: LEFT HEART CATH AND CORS/GRAFTS ANGIOGRAPHY;  Surgeon: Martinique, Peter M, MD;  Location: [facility];  Service: Cardiovascular;   Laterality: N/A;   SKIN CANCER EXCISION     "top of my head"   Patient Active Problem List   Diagnosis Date Noted   Pulmonary embolus ([place]) 02/08/2022   Combined forms of age-related cataract of left eye 11/10/2018   Combined forms of age-related cataract of right eye 11/03/2018   Intraoperative floppy iris syndrome (IFIS) 11/03/2018   Urine stream spraying 02/23/2018   Flank pain 02/22/2018   Chest pain 08/21/2017   Unstable angina ([place]) 08/21/2017   Renal cyst 12/20/2016   B12 deficiency 11/08/2016   Type 2 diabetes mellitus with neurological complications ([place]) 123XX123   Syncope and collapse 05/24/2015   Benign prostatic hyperplasia with urinary obstruction 12/12/2014   Carotid artery stenosis 05/22/2012   CAD (coronary artery disease) 05/14/2012   ED (erectile dysfunction) of organic origin 08/19/2011   FH: CABG (coronary artery bypass surgery) 01/28/2011   Mixed hyperlipidemia 10/23/2010   GERD (gastroesophageal reflux disease) 10/22/2010   History of colonic polyps 06/14/2010   REFERRING PROVIDER: Dorothyann Peng, NP   REFERRING DIAG: Chronic midline low back pain without sciatica   Rationale for Evaluation and Treatment: Rehabilitation  THERAPY DIAG:  Unsteadiness on feet  Muscle weakness (generalized)  Other low back pain  ONSET DATE: unsure  SUBJECTIVE:                                                                                                                                                                                           SUBJECTIVE STATEMENT: Pt denies any pain.  Pt reports mild dizziness.    PERTINENT HISTORY:  Allergies, angina, diabetes, visual impairments, hard of hearing, history of myocardial infarction, arthritis, and history of cancer  PAIN:  Are you having pain? No  PRECAUTIONS: Fall  PATIENT GOALS: improved strength, stand and walk longer (<5 minutes currently), and be able to stand upright while walking  NEXT MD VISIT:  unsure  OBJECTIVE:   LUMBAR ROM:   AROM eval  Flexion Not assessed due to safety; stands at 14 degrees of lumbar flexion  Extension 2  Right lateral flexion   Left lateral flexion   Right rotation 50% limited required modA due to LOB  Left rotation 75% limited; required maxA due to LOB   (Blank rows = not tested)  LOWER EXTREMITY ROM: WFL for activities assessed  LOWER EXTREMITY MMT:    MMT Right eval Left eval  Hip flexion 4/5 4/5  Hip extension    Hip abduction    Hip adduction    Hip internal rotation    Hip external rotation    Knee flexion 4-/5 4-/5  Knee extension 5/5 4/5  Ankle dorsiflexion 3/5 3/5  Ankle plantarflexion    Ankle inversion    Ankle eversion     (Blank rows = not tested)  FUNCTIONAL TESTS:  5 times sit to stand: 16.51 seconds w/o UE support; required cueing throughout for upright posture Timed up and go (TUG): 19.91 seconds w/o assistive device; required close supervision Dynamic Gait Index: 4/25 Sit to stand transfers: required close supervision Stand to sit transfers: required close supervision with uncontrolled descent  TODAY'S TREATMENT:                                                                                                                              DATE:  10/23/22  EXERCISE LOG  Exercise Repetitions and Resistance Comments  Nustep L4 x 20 min   Heel/Toe Raises Unable to perform due to dizziness   Standing Marches Unable to perform due to dizziness   Forward Step Ups Unable to perform due to dizziness   LAQ 5# x25 reps   Clam  Green x3 mins   Ball Squeezes X3 mins   Seated marching  5# x25 reps   Ham Curls Green x 20 reps   STS X10 reps    Blank cell = exercise not performed today   PATIENT EDUCATION:  Education details: POC, goals for therapy, safety Person educated: Patient Education method: Explanation Education comprehension: verbalized understanding  HOME EXERCISE  PROGRAM:  ASSESSMENT:  CLINICAL IMPRESSION: Pt arrives for today's treatment session denying any pain.  Pt reported increased dizziness after performance of the Nustep.   Deferred standing exercises today due to increased dizziness. Pt able to tolerate increased reps and/or time with all exercises today with minimal fatigue.  Pt would benefit from standing exercises at next treatment session if dizziness is not present.   OBJECTIVE IMPAIRMENTS: Abnormal gait, decreased activity tolerance, decreased balance, decreased mobility, difficulty walking, decreased ROM, decreased strength, postural dysfunction, and pain.   ACTIVITY LIMITATIONS: carrying, lifting, bending, standing, stairs, transfers, and locomotion level  PARTICIPATION LIMITATIONS: meal prep, cleaning, laundry, shopping, and community activity  PERSONAL FACTORS: Age, Fitness, Time since onset of injury/illness/exacerbation, and 3+ comorbidities: Allergies, angina, diabetes, visual impairments, hard of hearing, history of myocardial infarction, arthritis, and history of cancer  are also affecting patient's functional outcome.   REHAB POTENTIAL: Fair    CLINICAL DECISION MAKING: Unstable/unpredictable  EVALUATION COMPLEXITY: High  GOALS: Goals reviewed with patient? Yes  LONG TERM GOALS: Target date: 10/14/22  Patient will be independent with his HEP. Baseline:  Goal status: IN PROGRESS  2.  Patient will improve his timed up and to 15 seconds or less for improved safety. Baseline: 1/31: 14.7 seconds Goal status: MET  3.  Patient will improve his dynamic gait index score to at least 15/25 for improved safety and mobility. Baseline:  Goal status: IN PROGRESS  4.  Patient will be able to safely navigate at least 4 steps for improved household mobility. Baseline:  Goal status: IN PROGRESS  PLAN:  PT FREQUENCY: 2x/week  PT DURATION: 4 weeks  PLANNED INTERVENTIONS: Therapeutic exercises, Therapeutic activity,  Neuromuscular re-education, Balance training, Gait training, Patient/Family education, Self Care, Joint mobilization, Stair training, Electrical stimulation, Spinal mobilization, Cryotherapy, Moist heat, Manual therapy, and Re-evaluation.  PLAN FOR NEXT SESSION: NuStep, lumbar and lower extremity strengthening, postural reeducation, balance interventions, and modalities as needed  Kathrynn Ducking, PTA 10/23/2022, 11:09 AM

## 2022-10-30 ENCOUNTER — Ambulatory Visit: Payer: Medicare Other

## 2022-10-30 DIAGNOSIS — R2681 Unsteadiness on feet: Secondary | ICD-10-CM | POA: Diagnosis not present

## 2022-10-30 DIAGNOSIS — M5459 Other low back pain: Secondary | ICD-10-CM | POA: Diagnosis not present

## 2022-10-30 DIAGNOSIS — M6281 Muscle weakness (generalized): Secondary | ICD-10-CM | POA: Diagnosis not present

## 2022-10-30 NOTE — Therapy (Addendum)
OUTPATIENT PHYSICAL THERAPY THORACOLUMBAR TREATMENT   Patient Name: Michael Johnston MRN: [id number] DOB:August 29, 1932, 89 y.o., male Today's Date: 10/30/2022  END OF SESSION:  PT End of Session - 10/30/22 1036     Visit Number 8    Number of Visits 8    Date for PT Re-Evaluation 12/06/22    PT Start Time 1030    PT Stop Time 1115    PT Time Calculation (min) 45 min    Activity Tolerance Patient tolerated treatment well    Behavior During Therapy [facility] for tasks assessed/performed            Past Medical History:  Diagnosis Date   Arthritis    "right shoulder" (05/24/2015)   CHEST PAIN    Chronic bronchitis ([place])    "get it ~ q yr" (05/24/2015)   COLONIC POLYPS, HX OF    COPD (chronic obstructive pulmonary disease) ([place])    "dx'd but I don't take RX for it" (05/24/2015)   Coronary artery disease    Cystic kidney disease    GERD (gastroesophageal reflux disease)    History of hiatal hernia    MI (myocardial infarction) ([place]) 09/2010   Mixed hyperlipidemia    Skin cancer    "top of my head only" (05/24/2015)   Sleep apnea    Syncope and collapse ~ 2013; 05/24/2015   Past Surgical History:  Procedure Laterality Date   CARDIAC CATHETERIZATION  09/2010   [place]   "partial"   CORONARY ANGIOPLASTY     CORONARY ARTERY BYPASS GRAFT  09/2010   Median sternotomy for coronary artery bypass grafting x3  (left internal mammary artery to distal left anterior  descending  coronary artery, saphenous vein graft to first diagonal branch,  saphenous vein graft to first  obtuse marginal branch, endoscopic  saphenous vein harvest from right thigh). SURGEON:  Valentina Gu.  Roxy Manns, MD  ASSISTANT:  John Giovanni, PA-C  ANESTHESIA:  Glynda Jaeger, MD    LEFT HEART CATH AND CORS/GRAFTS ANGIOGRAPHY N/A 08/22/2017   Procedure: LEFT HEART CATH AND CORS/GRAFTS ANGIOGRAPHY;  Surgeon: Martinique, Peter M, MD;  Location: [facility];  Service: Cardiovascular;   Laterality: N/A;   SKIN CANCER EXCISION     "top of my head"   Patient Active Problem List   Diagnosis Date Noted   Pulmonary embolus ([place]) 02/08/2022   Combined forms of age-related cataract of left eye 11/10/2018   Combined forms of age-related cataract of right eye 11/03/2018   Intraoperative floppy iris syndrome (IFIS) 11/03/2018   Urine stream spraying 02/23/2018   Flank pain 02/22/2018   Chest pain 08/21/2017   Unstable angina ([place]) 08/21/2017   Renal cyst 12/20/2016   B12 deficiency 11/08/2016   Type 2 diabetes mellitus with neurological complications ([place]) 123XX123   Syncope and collapse 05/24/2015   Benign prostatic hyperplasia with urinary obstruction 12/12/2014   Carotid artery stenosis 05/22/2012   CAD (coronary artery disease) 05/14/2012   ED (erectile dysfunction) of organic origin 08/19/2011   FH: CABG (coronary artery bypass surgery) 01/28/2011   Mixed hyperlipidemia 10/23/2010   GERD (gastroesophageal reflux disease) 10/22/2010   History of colonic polyps 06/14/2010   REFERRING PROVIDER: Dorothyann Peng, NP   REFERRING DIAG: Chronic midline low back pain without sciatica   Rationale for Evaluation and Treatment: Rehabilitation  THERAPY DIAG:  Unsteadiness on feet  Muscle weakness (generalized)  Other low back pain  ONSET DATE: unsure  SUBJECTIVE:                                                                                                                                                                                           SUBJECTIVE STATEMENT: Pt denies any pain.  Pt reports mild dizziness.    PERTINENT HISTORY:  Allergies, angina, diabetes, visual impairments, hard of hearing, history of myocardial infarction, arthritis, and history of cancer  PAIN:  Are you having pain? No  PRECAUTIONS: Fall  PATIENT GOALS: improved strength, stand and walk longer (<5 minutes currently), and be able to stand upright while walking  NEXT MD VISIT:  unsure  OBJECTIVE:   LUMBAR ROM:   AROM eval  Flexion Not assessed due to safety; stands at 14 degrees of lumbar flexion  Extension 2  Right lateral flexion   Left lateral flexion   Right rotation 50% limited required modA due to LOB  Left rotation 75% limited; required maxA due to LOB   (Blank rows = not tested)  LOWER EXTREMITY ROM: WFL for activities assessed  LOWER EXTREMITY MMT:    MMT Right eval Left eval  Hip flexion 4/5 4/5  Hip extension    Hip abduction    Hip adduction    Hip internal rotation    Hip external rotation    Knee flexion 4-/5 4-/5  Knee extension 5/5 4/5  Ankle dorsiflexion 3/5 3/5  Ankle plantarflexion    Ankle inversion    Ankle eversion     (Blank rows = not tested)  FUNCTIONAL TESTS:  5 times sit to stand: 16.51 seconds w/o UE support; required cueing throughout for upright posture Timed up and go (TUG): 19.91 seconds w/o assistive device; required close supervision Dynamic Gait Index: 4/25 Sit to stand transfers: required close supervision Stand to sit transfers: required close supervision with uncontrolled descent  TODAY'S TREATMENT:                                                                                                                              DATE:  10/30/22  EXERCISE LOG  Exercise Repetitions and Resistance Comments  Nustep L4 x 20 min   Heel/Toe Raises Unable to perform due to dizziness   Standing Marches Unable to perform due to dizziness   Forward Step Ups Unable to perform due to dizziness   LAQ 5# x30 reps   Clam  Green x3 mins   Ball Squeezes X3 mins   Seated marching  5# x25 reps   Ham Curls    STS     Blank cell = exercise not performed today   PATIENT EDUCATION:  Education details: POC, goals for therapy, safety Person educated: Patient Education method: Explanation Education comprehension: verbalized understanding  HOME EXERCISE PROGRAM:  ASSESSMENT:  CLINICAL  IMPRESSION: Pt arrives for today's treatment session denying any pain, but reports mild dizziness today.  Pt has made minimal progress towards his goals at this time.  Pt score 4/25 on the DGI today.  Pt unable to safely navigate stairs with mod/maxA and use of single railing.  Pt instructed to call his MD to discuss his minimal progress. Pt ready for discharge at this time.   PHYSICAL THERAPY DISCHARGE SUMMARY  Visits from Start of Care: 8  Current functional level related to goals / functional outcomes: Patient was able to partially meet his goals for therapy. However, his functional mobility remains limited.    Remaining deficits: Dizziness and instability    Education / Equipment: HEP    Patient agrees to discharge. Patient goals were partially met. Patient is being discharged due to maximized rehab potential.   Jacqulynn Cadet, PT, DPT    OBJECTIVE IMPAIRMENTS: Abnormal gait, decreased activity tolerance, decreased balance, decreased mobility, difficulty walking, decreased ROM, decreased strength, postural dysfunction, and pain.   ACTIVITY LIMITATIONS: carrying, lifting, bending, standing, stairs, transfers, and locomotion level  PARTICIPATION LIMITATIONS: meal prep, cleaning, laundry, shopping, and community activity  PERSONAL FACTORS: Age, Fitness, Time since onset of injury/illness/exacerbation, and 3+ comorbidities: Allergies, angina, diabetes, visual impairments, hard of hearing, history of myocardial infarction, arthritis, and history of cancer  are also affecting patient's functional outcome.   REHAB POTENTIAL: Fair    CLINICAL DECISION MAKING: Unstable/unpredictable  EVALUATION COMPLEXITY: High  GOALS: Goals reviewed with patient? Yes  LONG TERM GOALS: Target date: 10/14/22  Patient will be independent with his HEP. Baseline:  Goal status: MET  2.  Patient will improve his timed up and to 15 seconds or less for improved safety. Baseline: 1/31: 14.7 seconds Goal  status: MET  3.  Patient will improve his dynamic gait index score to at least 15/25 for improved safety and mobility. Baseline: 2/21: 4/25  Goal status: NOT MET  4.  Patient will be able to safely navigate at least 4 steps for improved household mobility. Baseline: 2/21: requiring mod/maxA for safety and use of railing Goal status: NOT MET  PLAN:  PT FREQUENCY: 2x/week  PT DURATION: 4 weeks  PLANNED INTERVENTIONS: Therapeutic exercises, Therapeutic activity, Neuromuscular re-education, Balance training, Gait training, Patient/Family education, Self Care, Joint mobilization, Stair training, Electrical stimulation, Spinal mobilization, Cryotherapy, Moist heat, Manual therapy, and Re-evaluation.  PLAN FOR NEXT SESSION: NuStep, lumbar and lower extremity strengthening, postural reeducation, balance interventions, and modalities as needed  Kathrynn Ducking, PTA 10/30/2022, 11:41 AM

## 2022-11-13 ENCOUNTER — Ambulatory Visit (INDEPENDENT_AMBULATORY_CARE_PROVIDER_SITE_OTHER)
Admission: RE | Admit: 2022-11-13 | Discharge: 2022-11-13 | Disposition: A | Discharge status: Home / Self Care | Payer: Medicare Other | Source: Ambulatory Visit | Attending: Adult Health | Admitting: Adult Health

## 2022-11-13 ENCOUNTER — Encounter: Payer: Self-pay | Admitting: Adult Health

## 2022-11-13 ENCOUNTER — Ambulatory Visit (INDEPENDENT_AMBULATORY_CARE_PROVIDER_SITE_OTHER): Payer: Medicare Other | Admitting: Adult Health

## 2022-11-13 VITALS — BP 120/60 | HR 70 | Temp 97.8°F | Ht 69.75 in | Wt 170.0 lb

## 2022-11-13 DIAGNOSIS — M7989 Other specified soft tissue disorders: Secondary | ICD-10-CM | POA: Diagnosis not present

## 2022-11-13 DIAGNOSIS — M79641 Pain in right hand: Secondary | ICD-10-CM

## 2022-11-13 NOTE — Patient Instructions (Signed)
Please go to the [employer] office for an xray. Go the basement

## 2022-11-13 NOTE — Progress Notes (Signed)
Subjective:    Patient ID: Michael Johnston, male    DOB: 08-18-1932, 89 y.o.   MRN: [id number]  Hand Pain    89 year old male who  has a past medical history of Arthritis, CHEST PAIN, Chronic bronchitis ([place]), COLONIC POLYPS, HX OF, COPD (chronic obstructive pulmonary disease) ([place]), Coronary artery disease, Cystic kidney disease, GERD (gastroesophageal reflux disease), History of hiatal hernia, MI (myocardial infarction) ([place]) (09/2010), Mixed hyperlipidemia, Skin cancer, Sleep apnea, and Syncope and collapse (~ 2013; 05/24/2015).  He presents to the office today for concern of swelling in his right pinky finger. He reports that he was seen by a dermatologist about a month ago and was prescribed what sounds like antibiotics to take twice a day x 7 days. He reports that the pain improved but the swelling did not resolve. After he completed the course of medication the pain came back.   He denies trauma or known foreign object penetrating his right pinkie finger.    Review of Systems See HPI   Past Medical History:  Diagnosis Date   Arthritis    "right shoulder" (05/24/2015)   CHEST PAIN    Chronic bronchitis ([place])    "get it ~ q yr" (05/24/2015)   COLONIC POLYPS, HX OF    COPD (chronic obstructive pulmonary disease) ([place])    "dx'd but I don't take RX for it" (05/24/2015)   Coronary artery disease    Cystic kidney disease    GERD (gastroesophageal reflux disease)    History of hiatal hernia    MI (myocardial infarction) ([place]) 09/2010   Mixed hyperlipidemia    Skin cancer    "top of my head only" (05/24/2015)   Sleep apnea    Syncope and collapse ~ 2013; 05/24/2015    Social History   Socioeconomic History   Marital status: Married    Spouse name: Not on file   Number of children: Not on file   Years of education: Not on file   Highest education level: Not on file  Occupational History   Not on file  Tobacco Use   Smoking status: Former    Packs/day: 1.50    Years: 3.00     Total pack years: 4.50    Types: Cigarettes    Quit date: 12/09/1951    Years since quitting: 70.9   Smokeless tobacco: Never  Vaping Use   Vaping Use: Never used  Substance and Sexual Activity   Alcohol use: Not Currently    Comment: "no alcohol since 1953"   Drug use: No   Sexual activity: Not on file  Other Topics Concern   Not on file  Social History Narrative   Retired    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[place] Determinants of [employer] Strain: Low Risk  (10/16/2021)   Overall Financial Resource Strain (CARDIA)    Difficulty of Paying Living Expenses: Not hard at all  Food Insecurity: No Food Insecurity (10/16/2021)   Hunger Vital Sign    Worried About Running Out of Food in the Last Year: Never true    Ran Out of Food in the Last Year: Never true  Transportation Needs: No Transportation Needs (10/16/2021)   PRAPARE - Hydrologist (Medical): No    Lack of Transportation (Non-Medical): No  Physical Activity: Insufficiently Active (10/16/2021)   Exercise Vital Sign    Days of Exercise per Week: 2 days  Minutes of Exercise per Session: 40 min  Stress: No Stress Concern Present (10/16/2021)   [place]    Feeling of Stress : Not at all  Social Connections: [place] (10/16/2021)   Social Connection and Isolation Panel [NHANES]    Frequency of Communication with Friends and Family: More than three times a week    Frequency of Social Gatherings with Friends and Family: More than three times a week    Attends Religious Services: More than 4 times per year    Active Member of [employer] or Organizations: Yes    Attends Music therapist: More than 4 times per year    Marital Status: Married  Human resources officer Violence: Not At Risk (10/16/2021)   Humiliation, Afraid, Rape, and Kick questionnaire    Fear of Current or Ex-Partner: No    Emotionally  Abused: No    Physically Abused: No    Sexually Abused: No    Past Surgical History:  Procedure Laterality Date   CARDIAC CATHETERIZATION  09/2010   [place]   "partial"   CORONARY ANGIOPLASTY     CORONARY ARTERY BYPASS GRAFT  09/2010   Median sternotomy for coronary artery bypass grafting x3  (left internal mammary artery to distal left anterior  descending  coronary artery, saphenous vein graft to first diagonal branch,  saphenous vein graft to first  obtuse marginal branch, endoscopic  saphenous vein harvest from right thigh). SURGEON:  Valentina Gu.  Roxy Manns, MD  ASSISTANT:  John Giovanni, PA-C  ANESTHESIA:  Glynda Jaeger, MD    LEFT HEART CATH AND CORS/GRAFTS ANGIOGRAPHY N/A 08/22/2017   Procedure: LEFT HEART CATH AND CORS/GRAFTS ANGIOGRAPHY;  Surgeon: Martinique, Peter M, MD;  Location: [facility];  Service: Cardiovascular;  Laterality: N/A;   SKIN CANCER EXCISION     "top of my head"    Family History  Problem Relation Age of Onset   Lung cancer Sister    Heart attack Father     Allergies  Allergen Reactions   Levaquin [Levofloxacin In D5w] Swelling and Other (See Comments)    Eyes swollen   Other Anaphylaxis and Other (See Comments)    Bee sting   Ivp Dye [Iodinated Contrast Media] Other (See Comments)    Patient reports seizure   Metformin And Related Nausea Only    Current Outpatient Medications on File Prior to Visit  Medication Sig Dispense Refill   apixaban (ELIQUIS) 5 MG TABS tablet Take 1 tablet (5 mg total) by mouth 2 (two) times daily. 180 tablet 3   aspirin 81 MG tablet Take 81 mg by mouth daily.     atorvastatin (LIPITOR) 20 MG tablet TAKE 1 TABLET BY MOUTH EVERY DAY 90 tablet 3   cyanocobalamin (VITAMIN B12) 1000 MCG/ML injection INJECT 1 ML INTRAMUSCULARLY EVERY OTHER WEEK 3 mL 6   diclofenac Sodium (VOLTAREN) 1 % GEL Apply 2 g topically 4 (four) times daily as needed (For pain). 150 g 2   EPINEPHrine 0.3 mg/0.3 mL IJ  SOAJ injection      ketoconazole (NIZORAL) 2 % shampoo USE TWICE WEEKLY 120 mL 3   metFORMIN (GLUCOPHAGE) 500 MG tablet TAKE 1 TABLET BY MOUTH EVERY DAY WITH BREAKFAST 90 tablet 1   metoprolol tartrate (LOPRESSOR) 25 MG tablet TAKE 1 TABLET BY MOUTH TWICE A DAY 180 tablet 3   nitroGLYCERIN (NITROSTAT) 0.4 MG SL tablet Place 1  tablet (0.4 mg total) under the tongue every 5 (five) minutes as needed for chest pain (3 doses max). 25 tablet 1   SYRINGE-NEEDLE, DISP, 3 ML (B-D 3CC LUER-LOK SYR 25GX1") 25G X 1" 3 ML MISC USE AS DIRECTED TO INJECT B12 3 each 3   No current facility-administered medications on file prior to visit.    BP 120/60   Pulse 70   Temp 97.8 F (36.6 C) (Oral)   Ht 5' 9.75" (1.772 m)   Wt 170 lb (77.1 kg)   SpO2 97%   BMI 24.57 kg/m       Objective:   Physical Exam Vitals and nursing note reviewed.  Constitutional:      Appearance: Normal appearance.  Musculoskeletal:        General: Swelling and tenderness present.     Comments: He does have some swelling to the distal end of of his right pinkie finger. No redness or warmth but does have pain with palpation    Neurological:     General: No focal deficit present.     Mental Status: He is alert and oriented to person, place, and time.  Psychiatric:        Mood and Affect: Mood normal.        Behavior: Behavior normal.        Thought Content: Thought content normal.       Assessment & Plan:  1. Hand pain, not arthralgia, right - Will start with xray of right hand. Consider nsaids for arthritis.  - DG Hand Complete Right; Future   Dorothyann Peng, NP

## 2022-11-15 ENCOUNTER — Telehealth: Payer: Self-pay | Admitting: Adult Health

## 2022-11-15 ENCOUNTER — Other Ambulatory Visit: Payer: Self-pay

## 2022-11-15 DIAGNOSIS — M19041 Primary osteoarthritis, right hand: Secondary | ICD-10-CM

## 2022-11-15 NOTE — Telephone Encounter (Signed)
Patient notified of update  and verbalized understanding. 

## 2022-11-15 NOTE — Telephone Encounter (Signed)
Calling for info on results from the imaging for his hand

## 2022-11-21 ENCOUNTER — Encounter: Payer: Self-pay | Admitting: Physician Assistant

## 2022-11-21 ENCOUNTER — Ambulatory Visit (INDEPENDENT_AMBULATORY_CARE_PROVIDER_SITE_OTHER): Payer: Medicare Other | Admitting: Physician Assistant

## 2022-11-21 DIAGNOSIS — M19039 Primary osteoarthritis, unspecified wrist: Secondary | ICD-10-CM

## 2022-11-21 NOTE — Progress Notes (Signed)
HPI: Mr. Michael Johnston is pleasant 89 year old male were seen for the first time for right hand pain.  He saw his primary care physician for right hand pain and also dermatologist.  There was some thought that he may have had gout and this was treated and then he had some improvement and then tried an antibiotic when he was still having pain in the hand.  He states he was also told to try the gel on his hands but he has not tried it he states that this comes with a measuring stick in the space to put 2 g on and up to 3-4 times daily.  Most of his pain has helped along with the ulnar side of the wrist to the pinky.  It has been ongoing for few months no known injury.  States today is really not bothering him.  His pain can be 5 out of 10 pain at worst.  Said no other treatments.  He denies any numbness tingling in the hands.  He describes the pain as an achy pain.  He is on chronic Eliquis and he is prediabetic.  Radiographs right hand 3 views are reviewed and shows fifth finger PIP joint DIP joint with moderate arthritis.  Mild to moderate second through fifth PIP joint space narrowing.  At the wrist he has severe radial scaphoid and radial lunate arthritic changes with bone-on-bone.  Chronic erosions involving the distal radius.'s 7 mm ulnar positive variance.   Physical exam: General well-developed well-nourished male no acute distress mood affect appropriate.  Psych alert and oriented x 3. Right hand: No rashes skin lesions erythema or ecchymosis.  Sensation grossly intact throughout the hand.  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  Decreased range of motion of the left fifth finger compared to the right which is full with flexion and.  Slight tenderness over the dorsal radius and the ulnar aspect.  Radial and ulnar deviation causes some discomfort.  He is able to dorsiflex the wrist and volar flex the wrist without significant pain.    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  Recommend 2 g up to 4 times daily globally about the wrist.  He will call us if he is not improving in the next 4 to 6 weeks and we will refer him to hand surgeon.  Questions were encouraged and answered at length.

## 2022-12-12 ENCOUNTER — Encounter: Payer: Self-pay | Admitting: Cardiovascular Disease

## 2022-12-23 DIAGNOSIS — Z85828 Personal history of other malignant neoplasm of skin: Secondary | ICD-10-CM | POA: Diagnosis not present

## 2022-12-23 DIAGNOSIS — L570 Actinic keratosis: Secondary | ICD-10-CM | POA: Diagnosis not present

## 2023-01-05 NOTE — Progress Notes (Unsigned)
Patient ID: Michael Johnston, male   DOB: Jan 23, 1932, 89 y.o.   MRN: [id number]    Michael Johnston is seen for F/U CAD SEMI after getting injections at the dentist office September 27, 2010. Subsequently found to have 3VD needing CABG: LIMA to LAD, SVG to D1, SVG to OM  RCA was not grafted    Admitted 08/21/17 for chest pain  R/O CXR NAD no acute ECG changes Cath done by Dr [place] 08/22/17 patent Grafts normal EF and EDP RCA with no obstructive Disease   Carotid 12/28/15 plaque no stenosis due for f/u Last TTE 05/25/15 EF 55-60% no valve disease   Seen in ER 09/07/19 with chest pain. Woke him up at 4;30 am 7/10 took 2 nitro and ASA pain lasted 20 minutes had more GERD and gas as well In ER no acute ECG changes and negative troponin x 2 Started on Prilosec by primary 09/17/19   Myovue 11/01/19 normal non ischemic EF 60% continue on medical Rx  He is an avid reader of the [employer] Does not believe in COVID vaccine and won't get it  Has left foot drop needs bracing and has been falling more Had PT And is doing a bit better Pain in right wrist/hand from arthritis using Voltaren gel   Has a son and daughter that live near him and look in on him Still driving as is his wife  Using ketoconazole for foot fungus. He is not on AAT or Ranexa  BP stable and not on norvasc or mineralocorticoid   No cardiac complaints   ROS: Denies fever, malais, weight loss, blurry vision, decreased visual acuity, cough, sputum, SOB, hemoptysis, pleuritic pain, palpitaitons, heartburn, abdominal pain, melena, lower extremity edema, claudication, or rash.  All other systems reviewed and negative  General: There were no vitals taken for this visit. Affect appropriate Healthy:  appears stated age HEENT: normal Neck supple with no adenopathy JVP normal no bruits no thyromegaly Lungs clear with no wheezing and good diaphragmatic motion Heart:  S1/S2 no murmur, no rub, gallop or click PMI normal post sternotomy  Abdomen: benighn,  BS positve, no tenderness, no AAA no bruit.  No HSM or HJR Distal pulses intact with no bruits No edema Neuro non-focal Skin warm and dry Left sided foot drop     Current Outpatient Medications  Medication Sig Dispense Refill   apixaban (ELIQUIS) 5 MG TABS tablet Take 1 tablet (5 mg total) by mouth 2 (two) times daily. 180 tablet 3   aspirin 81 MG tablet Take 81 mg by mouth daily.     atorvastatin (LIPITOR) 20 MG tablet TAKE 1 TABLET BY MOUTH EVERY DAY 90 tablet 3   cyanocobalamin (VITAMIN B12) 1000 MCG/ML injection INJECT 1 ML INTRAMUSCULARLY EVERY OTHER WEEK 3 mL 6   diclofenac Sodium (VOLTAREN) 1 % GEL Apply 2 g topically 4 (four) times daily as needed (For pain). 150 g 2   EPINEPHrine 0.3 mg/0.3 mL IJ SOAJ injection      ketoconazole (NIZORAL) 2 % shampoo USE TWICE WEEKLY 120 mL 3   metFORMIN (GLUCOPHAGE) 500 MG tablet TAKE 1 TABLET BY MOUTH EVERY DAY WITH BREAKFAST 90 tablet 1   metoprolol tartrate (LOPRESSOR) 25 MG tablet TAKE 1 TABLET BY MOUTH TWICE A DAY 180 tablet 3   nitroGLYCERIN (NITROSTAT) 0.4 MG SL tablet Place 1 tablet (0.4 mg total) under the tongue every 5 (five) minutes as needed for chest pain (3 doses max). 25 tablet 1   SYRINGE-NEEDLE, DISP, 3 ML (  B-D 3CC LUER-LOK SYR 25GX1") 25G X 1" 3 ML MISC USE AS DIRECTED TO INJECT B12 3 each 3   No current facility-administered medications for this visit.    Allergies  Levaquin [levofloxacin in d5w], Other, Ivp dye [iodinated contrast media], and Metformin and related  Electrocardiogram:   01/05/2023 SR rate 56 LAD otherwise normal    Assessment and Plan  CAD:  CABG 2012  Cath 08/22/17 patent grafts  Normal EF  Non ischemic myovue 11/01/19 EF 60% continue medical Rx   HTN:  Well controlled.  Continue current medications and low sodium Dash type diet.    Cholesterol is at goal. 54 07/14/20 continue statin    DM:  Discussed low carb diet.  Target hemoglobin A1c is 6.5 or less.  Continue current medications.  Carotid:   Duplex 12/28/15 plaque no stenosis given age observe given age no need to repeat   GERD:  Improved with prilosec f/u primary Low carb diet   Drug Interaction:  Ketoconazole using low dose shampoo not pills           F/U with me in a year   Charlton Haws

## 2023-01-09 ENCOUNTER — Encounter: Payer: Self-pay | Admitting: Cardiovascular Disease

## 2023-01-09 ENCOUNTER — Ambulatory Visit: Payer: Medicare Other | Attending: Cardiovascular Disease | Admitting: Cardiovascular Disease

## 2023-01-09 VITALS — BP 124/70 | HR 55 | Ht 69.75 in | Wt 156.0 lb

## 2023-01-09 DIAGNOSIS — E782 Mixed hyperlipidemia: Secondary | ICD-10-CM

## 2023-01-09 DIAGNOSIS — Z951 Presence of aortocoronary bypass graft: Secondary | ICD-10-CM | POA: Diagnosis not present

## 2023-01-09 DIAGNOSIS — I10 Essential (primary) hypertension: Secondary | ICD-10-CM | POA: Diagnosis not present

## 2023-01-09 NOTE — Patient Instructions (Addendum)
Medication Instructions:  Your physician has recommended you make the following change in your medication:  1-STOP eliquis  *If you need a refill on your cardiac medications before your next appointment, please call your pharmacy*  Lab Work: If you have labs (blood work) drawn today and your tests are completely normal, you will receive your results only by: MyChart Message (if you have MyChart) OR A paper copy in the mail If you have any lab test that is abnormal or we need to change your treatment, we will call you to review the results.   Follow-Up: At [facility], you and your health needs are our priority.  As part of our continuing mission to provide you with exceptional heart care, we have created designated Provider Care Teams.  These Care Teams include your primary Cardiologist (physician) and Advanced Practice Providers (APPs -  Physician Assistants and Nurse Practitioners) who all work together to provide you with the care you need, when you need it.  We recommend signing up for the patient portal called "MyChart".  Sign up information is provided on this After Visit Summary.  MyChart is used to connect with patients for Virtual Visits (Telemedicine).  Patients are able to view lab/test results, encounter notes, upcoming appointments, etc.  Non-urgent messages can be sent to your provider as well.   To learn more about what you can do with MyChart, go to [email or web address].    Your next appointment:   1 year(s)  Provider:   Charlton Haws, MD

## 2023-01-15 DIAGNOSIS — M13831 Other specified arthritis, right wrist: Secondary | ICD-10-CM | POA: Diagnosis not present

## 2023-01-19 ENCOUNTER — Encounter (HOSPITAL_BASED_OUTPATIENT_CLINIC_OR_DEPARTMENT_OTHER): Payer: Self-pay | Admitting: Emergency Medicine

## 2023-01-19 ENCOUNTER — Other Ambulatory Visit: Payer: Self-pay

## 2023-01-19 ENCOUNTER — Emergency Department (HOSPITAL_BASED_OUTPATIENT_CLINIC_OR_DEPARTMENT_OTHER)
Admission: EM | Admit: 2023-01-19 | Discharge: 2023-01-19 | Disposition: A | Discharge status: Home / Self Care | Payer: Medicare Other | Attending: Emergency Medicine | Admitting: Emergency Medicine

## 2023-01-19 DIAGNOSIS — I2510 Atherosclerotic heart disease of native coronary artery without angina pectoris: Secondary | ICD-10-CM | POA: Diagnosis not present

## 2023-01-19 DIAGNOSIS — Z7982 Long term (current) use of aspirin: Secondary | ICD-10-CM | POA: Insufficient documentation

## 2023-01-19 DIAGNOSIS — J449 Chronic obstructive pulmonary disease, unspecified: Secondary | ICD-10-CM | POA: Insufficient documentation

## 2023-01-19 DIAGNOSIS — R2240 Localized swelling, mass and lump, unspecified lower limb: Secondary | ICD-10-CM | POA: Diagnosis not present

## 2023-01-19 DIAGNOSIS — M7989 Other specified soft tissue disorders: Secondary | ICD-10-CM | POA: Diagnosis not present

## 2023-01-19 LAB — COMPREHENSIVE METABOLIC PANEL
ALT: 11 U/L (ref 0–44)
AST: 14 U/L — ABNORMAL LOW (ref 15–41)
Albumin: 3.9 g/dL (ref 3.5–5.0)
Alkaline Phosphatase: 64 U/L (ref 38–126)
Anion gap: 8 (ref 5–15)
BUN: 29 mg/dL — ABNORMAL HIGH (ref 8–23)
CO2: 26 mmol/L (ref 22–32)
Calcium: 9 mg/dL (ref 8.9–10.3)
Chloride: 105 mmol/L (ref 98–111)
Creatinine, Ser: 0.88 mg/dL (ref 0.61–1.24)
GFR, Estimated: >60 mL/min (ref >60)
Glucose, Bld: 137 mg/dL — ABNORMAL HIGH (ref 70–99)
Potassium: 4.1 mmol/L (ref 3.5–5.1)
Sodium: 139 mmol/L (ref 135–145)
Total Bilirubin: 0.4 mg/dL (ref 0.3–1.2)
Total Protein: 6.3 g/dL — ABNORMAL LOW (ref 6.5–8.1)

## 2023-01-19 LAB — CBC
HCT: 33.4 % — ABNORMAL LOW (ref 39.0–52.0)
Hemoglobin: 11.3 g/dL — ABNORMAL LOW (ref 13.0–17.0)
MCH: 33.2 pg (ref 26.0–34.0)
MCHC: 33.8 g/dL (ref 30.0–36.0)
MCV: 98.2 fL (ref 80.0–100.0)
Platelets: 65 10*3/uL — ABNORMAL LOW (ref 150–400)
RBC: 3.4 MIL/uL — ABNORMAL LOW (ref 4.22–5.81)
RDW: 13.1 % (ref 11.5–15.5)
WBC: 4.8 10*3/uL (ref 4.0–10.5)
nRBC: 0 % (ref 0.0–0.2)

## 2023-01-19 MED ORDER — APIXABAN 2.5 MG PO TABS
10.0000 mg | ORAL_TABLET | Freq: Once | ORAL | Status: AC
  Administered 2023-01-19: 10 mg via ORAL
  Filled 2023-01-19: qty 4

## 2023-01-19 NOTE — ED Provider Notes (Signed)
[place] EMERGENCY DEPARTMENT AT [facility] Provider Note   CSN: [id number] Arrival date & time: 01/19/23  2043     History  Chief Complaint  Patient presents with   Leg Swelling    Michael Johnston is a 89 y.o. male.  HPI   Patient has a history of coronary artery disease, hiatal hernia, myocardial infarction, COPD, reflux, DVT who presents to the ED with complaints of leg swelling.  Patient has history of DVT and a side segmental PE.  History of unsteadiness on his feet and at risk for falls or patient was taken off of his Eliquis recently.  Patient presents to the ED for evaluation of leg swelling that started today.  Patient noticed swelling in his left calf.  He has had some discomfort in his left toe recently.  He denies any fevers or chills.  No recent falls.  Home Medications Prior to Admission medications   Medication Sig Start Date End Date Taking? Authorizing Provider  aspirin 81 MG tablet Take 81 mg by mouth daily.    [provider]  atorvastatin (LIPITOR) 20 MG tablet TAKE 1 TABLET BY MOUTH EVERY DAY 08/20/22   Nafziger, Kandee Keen, NP  cyanocobalamin (VITAMIN B12) 1000 MCG/ML injection INJECT 1 ML INTRAMUSCULARLY EVERY OTHER WEEK 08/28/22   Nafziger, Kandee Keen, NP  diclofenac Sodium (VOLTAREN) 1 % GEL Apply 2 g topically 4 (four) times daily as needed (For pain). 07/31/22   Nafziger, Kandee Keen, NP  EPINEPHrine 0.3 mg/0.3 mL IJ SOAJ injection  03/06/18   [provider]  ketoconazole (NIZORAL) 2 % shampoo USE TWICE WEEKLY 06/11/22   Nafziger, Kandee Keen, NP  metFORMIN (GLUCOPHAGE) 500 MG tablet TAKE 1 TABLET BY MOUTH EVERY DAY WITH BREAKFAST 08/20/22   Nafziger, Kandee Keen, NP  metoprolol tartrate (LOPRESSOR) 25 MG tablet TAKE 1 TABLET BY MOUTH TWICE A DAY 08/20/22   Nafziger, Kandee Keen, NP  nitroGLYCERIN (NITROSTAT) 0.4 MG SL tablet Place 1 tablet (0.4 mg total) under the tongue every 5 (five) minutes as needed for chest pain (3 doses max). 01/31/16   Wendall Stade, MD   SYRINGE-NEEDLE, DISP, 3 ML (B-D 3CC LUER-LOK SYR 25GX1") 25G X 1" 3 ML MISC USE AS DIRECTED TO INJECT B12 02/07/22   Nafziger, Kandee Keen, NP      Allergies    Levaquin [levofloxacin in d5w], Other, Ivp dye [iodinated contrast media], and Metformin and related    Review of Systems   Review of Systems  Physical Exam Updated Vital Signs BP (!) 153/79 (BP Location: Right Arm)   Pulse 65   Temp 97.8 F (36.6 C) (Oral)   Resp 18   Ht 1.772 m (5' 9.75")   Wt 71.2 kg   SpO2 100%   BMI 22.69 kg/m  Physical Exam Vitals and nursing note reviewed.  Constitutional:      General: He is not in acute distress.    Appearance: He is well-developed.  HENT:     Head: Normocephalic and atraumatic.     Right Ear: External ear normal.     Left Ear: External ear normal.  Eyes:     General: No scleral icterus.       Right eye: No discharge.        Left eye: No discharge.     Conjunctiva/sclera: Conjunctivae normal.  Neck:     Trachea: No tracheal deviation.  Cardiovascular:     Rate and Rhythm: Normal rate.  Pulmonary:     Effort: Pulmonary effort is normal. No respiratory  distress.     Breath sounds: No stridor.  Abdominal:     General: There is no distension.  Musculoskeletal:        General: Swelling present. No tenderness or deformity.     Cervical back: Neck supple.     Comments: Edema noted to the left calf, mild erythema noted left big toe, no lymphangitic streaking, no foot tenderness  Skin:    General: Skin is warm and dry.     Findings: No rash.  Neurological:     Mental Status: He is alert. Mental status is at baseline.     Cranial Nerves: No dysarthria or facial asymmetry.     Motor: No seizure activity.     ED Results / Procedures / Treatments   Labs (all labs ordered are listed, but only abnormal results are displayed) Labs Reviewed  COMPREHENSIVE METABOLIC PANEL  CBC    EKG None  Radiology No results found.  Procedures Procedures    Medications Ordered in  ED Medications  apixaban (ELIQUIS) tablet 10 mg (has no administration in time range)    ED Course/ Medical Decision Making/ A&P                             Medical Decision Making Differential diagnosis includes but not limited to, cellulitis, DVT  Problems Addressed: Leg swelling: acute illness or injury that poses a threat to life or bodily functions  Amount and/or Complexity of Data Reviewed Labs: ordered.  Risk Prescription drug management.   Patient presents to the ED with complaints of leg swelling.  Patient does have history of prior DVT.  He is not currently on any anticoagulation.  Patient denies any trouble with any chest pain or shortness of breath.  I planned on getting Doppler studies to rule out DVT unfortunate ultrasound is not available at this time.  Discussed options with patient and family of having him hold in the ED until 4:30 in the morning which is about 6 hours from now to do the ultrasound versus giving a dose of Eliquis and coming back for the Doppler study.  Patient is not having any chest pain or short of breath.  He is not tachycardic. Patient prefers to go home and rest and return to the ED for the Doppler study.        Final Clinical Impression(s) / ED Diagnoses Final diagnoses:  Leg swelling    Rx / DC Orders ED Discharge Orders     None         Linwood Dibbles, MD 01/19/23 2249

## 2023-01-19 NOTE — Discharge Instructions (Addendum)
Return to the ER in the morning to have your ultrasound performed

## 2023-01-19 NOTE — ED Triage Notes (Signed)
Pt via pov from home with left ankle swelling today. Pt reports hx of blood clots in both legs; was recently taken off eliquis by cardiologist. Pt alert & oriented, nad noted.

## 2023-01-20 ENCOUNTER — Encounter (HOSPITAL_BASED_OUTPATIENT_CLINIC_OR_DEPARTMENT_OTHER): Payer: Self-pay | Admitting: Emergency Medicine

## 2023-01-20 ENCOUNTER — Other Ambulatory Visit: Payer: Self-pay

## 2023-01-20 ENCOUNTER — Other Ambulatory Visit (HOSPITAL_COMMUNITY): Payer: Self-pay

## 2023-01-20 ENCOUNTER — Emergency Department (HOSPITAL_BASED_OUTPATIENT_CLINIC_OR_DEPARTMENT_OTHER)
Admission: EM | Admit: 2023-01-20 | Discharge: 2023-01-20 | Disposition: A | Discharge status: Home / Self Care | Payer: Medicare Other | Attending: Emergency Medicine | Admitting: Emergency Medicine

## 2023-01-20 ENCOUNTER — Emergency Department (HOSPITAL_BASED_OUTPATIENT_CLINIC_OR_DEPARTMENT_OTHER)
Admission: RE | Admit: 2023-01-20 | Discharge: 2023-01-20 | Disposition: A | Discharge status: Home / Self Care | Payer: Medicare Other | Source: Ambulatory Visit | Attending: Adult Health | Admitting: Adult Health

## 2023-01-20 DIAGNOSIS — Z7982 Long term (current) use of aspirin: Secondary | ICD-10-CM | POA: Diagnosis not present

## 2023-01-20 DIAGNOSIS — R600 Localized edema: Secondary | ICD-10-CM

## 2023-01-20 DIAGNOSIS — I82432 Acute embolism and thrombosis of left popliteal vein: Secondary | ICD-10-CM | POA: Diagnosis not present

## 2023-01-20 DIAGNOSIS — J449 Chronic obstructive pulmonary disease, unspecified: Secondary | ICD-10-CM | POA: Diagnosis not present

## 2023-01-20 DIAGNOSIS — I2510 Atherosclerotic heart disease of native coronary artery without angina pectoris: Secondary | ICD-10-CM | POA: Diagnosis not present

## 2023-01-20 DIAGNOSIS — R2243 Localized swelling, mass and lump, lower limb, bilateral: Secondary | ICD-10-CM | POA: Diagnosis present

## 2023-01-20 DIAGNOSIS — Z7901 Long term (current) use of anticoagulants: Secondary | ICD-10-CM | POA: Diagnosis not present

## 2023-01-20 DIAGNOSIS — I82402 Acute embolism and thrombosis of unspecified deep veins of left lower extremity: Secondary | ICD-10-CM | POA: Diagnosis not present

## 2023-01-20 MED ORDER — APIXABAN 5 MG PO TABS: 5.0000 mg | ORAL_TABLET | Freq: Two times a day (BID) | ORAL | 0 refills | Status: DC

## 2023-01-20 NOTE — ED Provider Notes (Signed)
[place] EMERGENCY DEPARTMENT AT [facility] Provider Note   CSN: [id number] Arrival date & time: 01/20/23  0754     History  Chief Complaint  Patient presents with   DVT    Michael Johnston is a 89 y.o. male.  The history is provided by the patient and a relative.  Patient with history of CAD, COPD, previous VTE presents with leg swelling.  This started in the past 24 hours.  No fevers or vomiting.  No trauma.  No hematemesis, no black or bloody stools.  No epistaxis. He was just recently taken off of Eliquis due to unsteadiness and frequent falls     Home Medications Prior to Admission medications   Medication Sig Start Date End Date Taking? Authorizing Provider  apixaban (ELIQUIS) 5 MG TABS tablet Take 1 tablet (5 mg total) by mouth 2 (two) times daily. 01/20/23  Yes Zadie Rhine, MD  aspirin 81 MG tablet Take 81 mg by mouth daily.    [provider]  atorvastatin (LIPITOR) 20 MG tablet TAKE 1 TABLET BY MOUTH EVERY DAY 08/20/22   Nafziger, Kandee Keen, NP  cyanocobalamin (VITAMIN B12) 1000 MCG/ML injection INJECT 1 ML INTRAMUSCULARLY EVERY OTHER WEEK 08/28/22   Nafziger, Kandee Keen, NP  diclofenac Sodium (VOLTAREN) 1 % GEL Apply 2 g topically 4 (four) times daily as needed (For pain). 07/31/22   Nafziger, Kandee Keen, NP  EPINEPHrine 0.3 mg/0.3 mL IJ SOAJ injection  03/06/18   [provider]  ketoconazole (NIZORAL) 2 % shampoo USE TWICE WEEKLY 06/11/22   Nafziger, Kandee Keen, NP  metFORMIN (GLUCOPHAGE) 500 MG tablet TAKE 1 TABLET BY MOUTH EVERY DAY WITH BREAKFAST 08/20/22   Nafziger, Kandee Keen, NP  metoprolol tartrate (LOPRESSOR) 25 MG tablet TAKE 1 TABLET BY MOUTH TWICE A DAY 08/20/22   Nafziger, Kandee Keen, NP  nitroGLYCERIN (NITROSTAT) 0.4 MG SL tablet Place 1 tablet (0.4 mg total) under the tongue every 5 (five) minutes as needed for chest pain (3 doses max). 01/31/16   Wendall Stade, MD  SYRINGE-NEEDLE, DISP, 3 ML (B-D 3CC LUER-LOK SYR 25GX1") 25G X 1" 3 ML MISC USE AS  DIRECTED TO INJECT B12 02/07/22   Nafziger, Kandee Keen, NP      Allergies    Levaquin [levofloxacin in d5w], Other, Ivp dye [iodinated contrast media], and Metformin and related    Review of Systems   Review of Systems  HENT:  Negative for nosebleeds.   Respiratory:  Negative for shortness of breath.   Cardiovascular:  Positive for leg swelling. Negative for chest pain.  Gastrointestinal:  Negative for anal bleeding and blood in stool.  Genitourinary:  Negative for hematuria.    Physical Exam Updated Vital Signs BP 132/73 (BP Location: Left Arm)   Pulse 64   Temp (!) 97.5 F (36.4 C) (Oral)   Resp 20   Ht 1.772 m (5' 9.75")   Wt 71.2 kg   SpO2 97%   BMI 22.68 kg/m  Physical Exam CONSTITUTIONAL: Elderly, no acute distress HEAD: Normocephalic/atraumatic CV: S1/S2 noted, no murmurs/rubs/gallops noted LUNGS: Lungs are clear to auscultation bilaterally, no apparent distress ABDOMEN: soft NEURO: Pt is awake/alert/appropriate, moves all extremitiesx4.  No facial droop.   EXTREMITIES: pulses normal/equal, full ROM Left lower extremity with mild pitting edema.  No erythema.  Distal pulses equal and intact.  No visible trauma. No obvious joint effusions SKIN: warm, color normal   ED Results / Procedures / Treatments   Labs (all labs ordered are listed, but only abnormal results are displayed)  Labs Reviewed - No data to display  EKG None  Radiology US Venous Img Lower Unilateral Left  Result Date: 01/20/2023 CLINICAL DATA:  89 year old male with history of left ankle swelling. EXAM: LEFT LOWER EXTREMITY VENOUS DOPPLER ULTRASOUND TECHNIQUE: Gray-scale sonography with graded compression, as well as color Doppler and duplex ultrasound were performed to evaluate the lower extremity deep venous systems from the level of the common femoral vein and including the common femoral, femoral, profunda femoral, popliteal and calf veins including the posterior tibial, peroneal and gastrocnemius  veins when visible. The superficial great saphenous vein was also interrogated. Spectral Doppler was utilized to evaluate flow at rest and with distal augmentation maneuvers in the common femoral, femoral and popliteal veins. COMPARISON:  None Available. FINDINGS: Contralateral Common Femoral Vein: Respiratory phasicity is normal and symmetric with the symptomatic side. No evidence of thrombus. Normal compressibility. Common Femoral Vein: No evidence of thrombus. Normal compressibility, respiratory phasicity and response to augmentation. Saphenofemoral Junction: No evidence of thrombus. Normal compressibility and flow on color Doppler imaging. Profunda Femoral Vein: No evidence of thrombus. Normal compressibility and flow on color Doppler imaging. Femoral Vein: No evidence of thrombus. Normal compressibility, respiratory phasicity and response to augmentation. Popliteal Vein: Positive for thrombus in the popliteal vein. Calf Veins: Positive for thrombus in the posterior tibial vein. Other calf veins poorly visualized Superficial Great Saphenous Vein: No evidence of thrombus. Normal compressibility. Venous Reflux:  None. Other Findings:  None. IMPRESSION: 1. Today's study is positive for deep venous thrombosis in the left popliteal vein and posterior tibial vein (peroneal vein not well visualized). Electronically Signed   By: Trudie Reed M.D.   On: 01/20/2023 07:44    Procedures Procedures    Medications Ordered in ED Medications - No data to display  ED Course/ Medical Decision Making/ A&P Clinical Course as of 01/20/23 0826  Mon Jan 20, 2023  1552 Patient well-appearing.  He is in no acute distress.  He denies any chest pain or shortness of breath.  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  At this point, patient without chest pain or shortness of breath, no vital signs instability.  We discussed risk and benefits of CT imaging.  He had poor reaction to IV contrast previously.   At the end of the day his treatment would likely not change.  We will defer CT imaging for now and empirically treat for VTE [DW]  0818 Patient with history of thrombocytopenia when I reviewed his labs.  This is been monitored by oncology.  They are recommending continuing DOAC as long as platelets are above 50,000. [DW]  0818 will send message to ensure close follow-up with oncology.  Patient and son are agreeable with plan.  We discussed return precautions [DW]    Clinical Course User Index [DW] Zadie Rhine, MD                             Medical Decision Making Risk Prescription drug management.   This patient presents to the ED for concern of leg swelling, this involves an extensive number of treatment options, and is a complaint that carries with it a high risk of complications and morbidity.  The differential diagnosis includes but is not limited to muscle strain, DVT, necrotizing fasciitis, cellulitis, joint effusion, septic arthritis  Comorbidities that complicate the patient evaluation: Patient's presentation is complicated by their history of CAD and previous VTE  Social  Determinants of Health: Patient's  frequent falls   increases the complexity of managing their presentation  Additional history obtained: Additional history obtained from family Records reviewed previous admission documents and cardiology notes  Lab Tests: I Ordered, and personally interpreted labs.  The pertinent results include: Labs from last night reveal normal renal function, thrombocytopenia  Imaging Studies ordered: I ordered imaging studies including Ultrasound DVT study   I independently visualized and interpreted imaging which showed positive DVT in the left lower extremity I agree with the radiologist interpretation   Consultations Obtained: I requested consultation with the consultant pharmacy Jairo Ben , and discussed  findings as well as pertinent plan - they recommend: Already  given Eliquis 10 mg last night.  She recommends Eliquis 5 mg twice daily  Complexity of problems addressed: Patient's presentation is most consistent with  acute presentation with potential threat to life or bodily function  Disposition: After consideration of the diagnostic results and the patient's response to treatment,  I feel that the patent would benefit from discharge   .           Final Clinical Impression(s) / ED Diagnoses Final diagnoses:  Acute deep vein thrombosis (DVT) of popliteal vein of left lower extremity (HCC)    Rx / DC Orders ED Discharge Orders          Ordered    apixaban (ELIQUIS) 5 MG TABS tablet  2 times daily        01/20/23 0817              Zadie Rhine, MD 01/20/23 [phone]

## 2023-01-20 NOTE — ED Notes (Signed)
Pt given discharge instructions and reviewed prescriptions. Opportunities given for questions. Pt verbalizes understanding. Latonya Knight R, RN 

## 2023-01-20 NOTE — ED Triage Notes (Signed)
Pt arrived early this morning for U/S scheduled after being seen in the ED last night for L ankle swelling. Pt denies SOB or pain. U/S showed DVT in L leg. Hx DVT. Recently taken off Eliquis. Pt caox4, ambulatory with cane. NAD.

## 2023-01-23 NOTE — Progress Notes (Unsigned)
[facility] [address], [place] [address]   CLINIC:  Medical Oncology/Hematology  PCP:  Shirline Frees, NP [address] [place] [address] [phone]   REASON FOR VISIT:  Follow-up for unprovoked bilateral DVT and bilateral PE   CURRENT THERAPY: Eliquis   INTERVAL HISTORY:   Michael Johnston 89 y.o. male returns for routine follow-up of his DVT and PE, on chronic anticoagulation with Eliquis.  He was last seen by Rojelio Brenner PA-C on 09/18/2022.  Since his last visit, he was seen by his cardiologist (Dr. Eden Emms) on 01/09/2023, who recommended discontinuation of Eliquis due to patient being unsteady on his feet, concern regarding advanced age, and concern regarding cost of Eliquis.  He subsequently presented to the ED on 01/20/2023 with acute left leg swelling, and LLE venous ultrasound was positive for DVT in left popliteal vein and posterior tibial vein.  He was restarted on Eliquis during ED visit, and denies any major associated blood loss.  *** ***He has had issues with falling occasionally due to foot drop, but denies any falls within the past 6 months. - fell once (2 weeks ago) *** He denies any peripheral edema, chest pain, or dyspnea on exertion. *** He bruises easily but denies petechial rash. *** No B symptoms such as fever, chills, night sweats, unintentional weight loss.   He has 40% energy and 100% appetite. He endorses that he is maintaining a stable weight. ***feels like he is losing weight - sometimes restricts too much due to high A1C/pre-diabetes   ASSESSMENT & PLAN:  1.  Unprovoked bilateral DVT and bilateral PE - Seen  at the request of hospitalist (Dr. Blake Divine) and PCP (NP Shirline Frees) for evaluation of bilateral DVT and bilateral pulmonary embolism. -  Patient was seen by his PCP on 02/07/2021 for complaints of bilateral lower extremity edema.  D-dimer found to be significantly elevated at 11.01, therefore patient was sent to  the ED where he was found to have bilateral age-indeterminate DVT with acute bilateral PE Vascular US bilateral lower extremity venous (02/08/2022): Age-indeterminate DVT of bilateral lower extremities (right posterior tibial and peroneal veins + left popliteal vein, posterior tibial vein, and left peroneal veins) CTA chest (02/08/2022): Acute subsegmental pulmonary emboli in right upper and lower lobe, small clot burden 2D echo (02/09/2022): LVEF 60 to 65%, mildly reduced RV systolic function - Initially treated with IV heparin while hospitalized, transitioned to oral Eliquis at discharge.   - LLE venous US (01/20/2023) was obtained at ED visit after patient presented with complaints of acute left leg swelling *** and showed "study positive for DVT in left popliteal vein and posterior tibial vein," but did not comment on the chronicity of these findings ***    - No prior blood per rectum, melena, or epistaxis.   Patient falls occasionally due to his foot drop, but denies any falls within the past 6 months. *** - No obvious provoking factors such as prolonged immobility, long flights, hospitalization, surgery, or injury.  Patient has very active lifestyle and is independent with ADLs. - No prior history of DVT or PE.  No family history of DVT, PE, or miscarriages. - Leg swelling and dyspnea on exertion have resolved.***recurrent LLE edema - PLAN: Due to unprovoked bilateral DVT and PE, recommend indefinite anticoagulation. - We discussed risks and benefits of prolonged anticoagulation, including risk of life-threatening bleeding event, which is considered to be less than the risk of recurrent blood clot if he were not  on anticoagulation. - Patient remains eligible for 5 mg twice daily dosing (based on age >80 years, we would consider decreasing to 2.5 mg twice daily if he had body weight <60 kg or serum creatinine >1.5 mg) - No utility for hypercoagulable studies, since this would not change management. - We  will see patient for follow-up in 6 months with labs (CBC, CMP, D-dimer) and ongoing risk benefit assessment.   *** CHECK [place] IN 3-6 MONTHS TO LOOK FOR RESOLUTION???   2.  Thrombocytopenia, mild to moderate - Mild intermittent thrombocytopenia since at least 2012.  Baseline platelets usually >100, but with transient worsening of thrombocytopenia during hospital stays. - CT abdomen/pelvis (09/15/2020): Liver and spleen unremarkable. - Ports easy bruising.  No petechial rash, bleeding, or B symptoms. - CBC today (09/18/2022) shows platelets 117, at baseline. - DIFFERENTIAL DIAGNOSIS includes nutritional deficiency, immune mediated thrombocytopenia, or bone marrow infiltrative process such as MDS - PLAN: Nutritional panel obtained today is pending.  Will follow and make additional recommendations as needed. - It is reasonable to continue patient on Eliquis as long as his platelets remain >50,000.  *** anemia  *** weight loss   3.  Other history - PMH: Coronary artery disease s/p CABG, cystic kidney disease, GERD, hyperlipidemia, skin cancer, sleep apnea (does not use CPAP), and arthritis. - SOCIAL: Patient lives at home with his wife of 67 years.  He is very active.  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  He smoked when he was a teenager.  He denies any alcohol or illicit drug use. - FAMILY: Family history is negative for any blood clots or known hypercoagulable states.  Sister passed away from unspecified cancer.  PLAN SUMMARY: >> *** >> *** >> ***   [facility] **VISIT SUMMARY & IMPORTANT INSTRUCTIONS **   You were seen today by Rojelio Brenner PA-C for your ***.    *** ***  *** ***  LABS: Return in ***   OTHER TESTS: ***  MEDICATIONS: ***  FOLLOW-UP APPOINTMENT: ***     REVIEW OF SYSTEMS: ***  Review of Systems - Oncology   PHYSICAL EXAM:  ECOG PERFORMANCE STATUS: {CHL ONC ECOG  ZO:[id number]} *** There were no vitals filed for this visit. There were no vitals filed for this visit. Physical Exam  PAST MEDICAL/SURGICAL HISTORY:  Past Medical History:  Diagnosis Date   Arthritis    "right shoulder" (05/24/2015)   CHEST PAIN    Chronic bronchitis (HCC)    "get it ~ q yr" (05/24/2015)   COLONIC POLYPS, HX OF    COPD (chronic obstructive pulmonary disease) (HCC)    "dx'd but I don't take RX for it" (05/24/2015)   Coronary artery disease    Cystic kidney disease    GERD (gastroesophageal reflux disease)    History of hiatal hernia    MI (myocardial infarction) (HCC) 09/2010   Mixed hyperlipidemia    Skin cancer    "top of my head only" (05/24/2015)   Sleep apnea    Syncope and collapse ~ 2013; 05/24/2015   Past Surgical History:  Procedure Laterality Date   CARDIAC CATHETERIZATION  09/2010   CHOLECYSTECTOMY OPEN  1998   COLECTOMY  1998   "partial"   CORONARY ANGIOPLASTY     CORONARY ARTERY BYPASS GRAFT  09/2010   Median sternotomy for coronary artery bypass grafting x3  (left internal mammary artery to distal left anterior  descending  coronary artery, saphenous vein graft to first diagonal branch,  saphenous vein graft to first  obtuse marginal branch, endoscopic  saphenous vein harvest from right thigh). SURGEON:  Salvatore Decent.  Cornelius Moras, MD  ASSISTANT:  Rowe Clack, PA-C  ANESTHESIA:  Guadalupe Maple, MD    LEFT HEART CATH AND CORS/GRAFTS ANGIOGRAPHY N/A 08/22/2017   Procedure: LEFT HEART CATH AND CORS/GRAFTS ANGIOGRAPHY;  Surgeon: [place], Peter M, MD;  Location: [facility] INVASIVE CV LAB;  Service: Cardiovascular;  Laterality: N/A;   SKIN CANCER EXCISION     "top of my head"    SOCIAL HISTORY:  Social History   Socioeconomic History   Marital status: Married    Spouse name: Not on file   Number of children: Not on file   Years of education: Not on file   Highest education level: Not on file  Occupational History   Not on file  Tobacco Use   Smoking status:  Former    Packs/day: 1.50    Years: 3.00    Additional pack years: 0.00    Total pack years: 4.50    Types: Cigarettes    Quit date: 12/09/1951    Years since quitting: 71.1   Smokeless tobacco: Never  Vaping Use   Vaping Use: Never used  Substance and Sexual Activity   Alcohol use: Not Currently    Comment: "no alcohol since 1953"   Drug use: No   Sexual activity: Not on file  Other Topics Concern   Not on file  Social History Narrative   Retired    Is a Education officer, 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 [place]    Social Determinants of [employer] Strain: Low Risk  (10/16/2021)   Overall Financial Resource Strain (CARDIA)    Difficulty of Paying Living Expenses: Not hard at all  Food Insecurity: No Food Insecurity (10/16/2021)   Hunger Vital Sign    Worried About Running Out of Food in the Last Year: Never true    Ran Out of Food in the Last Year: Never true  Transportation Needs: No Transportation Needs (10/16/2021)   PRAPARE - Administrator, Civil Service (Medical): No    Lack of Transportation (Non-Medical): No  Physical Activity: Insufficiently Active (10/16/2021)   Exercise Vital Sign    Days of Exercise per Week: 2 days    Minutes of Exercise per Session: 40 min  Stress: No Stress Concern Present (10/16/2021)   [employer] of Occupational Health - Occupational Stress Questionnaire    Feeling of Stress : Not at all  Social Connections: Socially Integrated (10/16/2021)   Social Connection and Isolation Panel [NHANES]    Frequency of Communication with Friends and Family: More than three times a week    Frequency of Social Gatherings with Friends and Family: More than three times a week    Attends Religious Services: More than 4 times per year    Active Member of [employer] or Organizations: Yes    Attends Engineer, structural: More than 4 times per year    Marital Status: Married  Catering manager Violence: Not At Risk (10/16/2021)   Humiliation, Afraid, Rape, and  Kick questionnaire    Fear of Current or Ex-Partner: No    Emotionally Abused: No    Physically Abused: No    Sexually Abused: No    FAMILY HISTORY:  Family History  Problem Relation Age of Onset   Lung cancer Sister    Heart attack Father  CURRENT MEDICATIONS:  Outpatient Encounter Medications as of 01/24/2023  Medication Sig   apixaban (ELIQUIS) 5 MG TABS tablet Take 1 tablet (5 mg total) by mouth 2 (two) times daily.   aspirin 81 MG tablet Take 81 mg by mouth daily.   atorvastatin (LIPITOR) 20 MG tablet TAKE 1 TABLET BY MOUTH EVERY DAY   cyanocobalamin (VITAMIN B12) 1000 MCG/ML injection INJECT 1 ML INTRAMUSCULARLY EVERY OTHER WEEK   diclofenac Sodium (VOLTAREN) 1 % GEL Apply 2 g topically 4 (four) times daily as needed (For pain).   EPINEPHrine 0.3 mg/0.3 mL IJ SOAJ injection    ketoconazole (NIZORAL) 2 % shampoo USE TWICE WEEKLY   metFORMIN (GLUCOPHAGE) 500 MG tablet TAKE 1 TABLET BY MOUTH EVERY DAY WITH BREAKFAST   metoprolol tartrate (LOPRESSOR) 25 MG tablet TAKE 1 TABLET BY MOUTH TWICE A DAY   nitroGLYCERIN (NITROSTAT) 0.4 MG SL tablet Place 1 tablet (0.4 mg total) under the tongue every 5 (five) minutes as needed for chest pain (3 doses max).   SYRINGE-NEEDLE, DISP, 3 ML (B-D 3CC LUER-LOK SYR 25GX1") 25G X 1" 3 ML MISC USE AS DIRECTED TO INJECT B12   No facility-administered encounter medications on file as of 01/24/2023.    ALLERGIES:  Allergies  Allergen Reactions   Levaquin [Levofloxacin In D5w] Swelling and Other (See Comments)    Eyes swollen   Other Anaphylaxis and Other (See Comments)    Bee sting   Ivp Dye [Iodinated Contrast Media] Other (See Comments)    Patient reports seizure   Metformin And Related Nausea Only    LABORATORY DATA:  I have reviewed the labs as listed.  CBC    Component Value Date/Time   WBC 4.8 01/19/2023 2235   RBC 3.40 (L) 01/19/2023 2235   HGB 11.3 (L) 01/19/2023 2235   HCT 33.4 (L) 01/19/2023 2235   PLT 65 (L) 01/19/2023  2235   MCV 98.2 01/19/2023 2235   MCH 33.2 01/19/2023 2235   MCHC 33.8 01/19/2023 2235   RDW 13.1 01/19/2023 2235   LYMPHSABS 1.6 09/18/2022 1021   MONOABS 0.4 09/18/2022 1021   EOSABS 0.1 09/18/2022 1021   BASOSABS 0.1 09/18/2022 1021      Latest Ref Rng & Units 01/19/2023   10:35 PM 09/18/2022   10:21 AM 08/21/2022    9:05 AM  CMP  Glucose 70 - 99 mg/dL 161  096  045   BUN 8 - 23 mg/dL 29  24  22    Creatinine 0.61 - 1.24 mg/dL 4.09  8.11  9.14   Sodium 135 - 145 mmol/L 139  137  140   Potassium 3.5 - 5.1 mmol/L 4.1  4.3  4.6   Chloride 98 - 111 mmol/L 105  101  103   CO2 22 - 32 mmol/L 26  27  31    Calcium 8.9 - 10.3 mg/dL 9.0  9.6  9.5   Total Protein 6.5 - 8.1 g/dL 6.3  7.0  6.8   Total Bilirubin 0.3 - 1.2 mg/dL 0.4  0.6  0.8   Alkaline Phos 38 - 126 U/L 64  61  58   AST 15 - 41 U/L 14  20  20    ALT 0 - 44 U/L 11  20  17      DIAGNOSTIC IMAGING:  I have independently reviewed the relevant imaging and discussed with the patient.   WRAP UP:  All questions were answered. The patient knows to call the clinic with any problems, questions or concerns.  Medical decision making: ***  Time spent on visit: I spent *** minutes counseling the patient face to face. The total time spent in the appointment was *** minutes and more than 50% was on counseling.  Carnella Guadalajara, PA-C  ***

## 2023-01-24 ENCOUNTER — Inpatient Hospital Stay: Payer: Medicare Other

## 2023-01-24 ENCOUNTER — Inpatient Hospital Stay: Payer: Medicare Other | Attending: Physician Assistant | Admitting: Physician Assistant

## 2023-01-24 VITALS — BP 121/61 | HR 64 | Temp 97.7°F | Resp 18 | Ht 69.75 in | Wt 167.8 lb

## 2023-01-24 DIAGNOSIS — I82453 Acute embolism and thrombosis of peroneal vein, bilateral: Secondary | ICD-10-CM | POA: Diagnosis not present

## 2023-01-24 DIAGNOSIS — I82432 Acute embolism and thrombosis of left popliteal vein: Secondary | ICD-10-CM | POA: Diagnosis not present

## 2023-01-24 DIAGNOSIS — D649 Anemia, unspecified: Secondary | ICD-10-CM | POA: Insufficient documentation

## 2023-01-24 DIAGNOSIS — Z85828 Personal history of other malignant neoplasm of skin: Secondary | ICD-10-CM | POA: Diagnosis not present

## 2023-01-24 DIAGNOSIS — I82532 Chronic embolism and thrombosis of left popliteal vein: Secondary | ICD-10-CM | POA: Insufficient documentation

## 2023-01-24 DIAGNOSIS — Z7901 Long term (current) use of anticoagulants: Secondary | ICD-10-CM | POA: Diagnosis not present

## 2023-01-24 DIAGNOSIS — D696 Thrombocytopenia, unspecified: Secondary | ICD-10-CM | POA: Diagnosis not present

## 2023-01-24 DIAGNOSIS — Q619 Cystic kidney disease, unspecified: Secondary | ICD-10-CM | POA: Insufficient documentation

## 2023-01-24 DIAGNOSIS — I2694 Multiple subsegmental pulmonary emboli without acute cor pulmonale: Secondary | ICD-10-CM | POA: Insufficient documentation

## 2023-01-24 DIAGNOSIS — K219 Gastro-esophageal reflux disease without esophagitis: Secondary | ICD-10-CM | POA: Insufficient documentation

## 2023-01-24 DIAGNOSIS — I2510 Atherosclerotic heart disease of native coronary artery without angina pectoris: Secondary | ICD-10-CM | POA: Insufficient documentation

## 2023-01-24 DIAGNOSIS — G4730 Sleep apnea, unspecified: Secondary | ICD-10-CM | POA: Diagnosis not present

## 2023-01-24 LAB — CBC WITH DIFFERENTIAL/PLATELET
Abs Immature Granulocytes: 0.01 10*3/uL (ref 0.00–0.07)
Basophils Absolute: 0.1 10*3/uL (ref 0.0–0.1)
Basophils Relative: 1 %
Eosinophils Absolute: 0.1 10*3/uL (ref 0.0–0.5)
Eosinophils Relative: 2 %
HCT: 37.5 % — ABNORMAL LOW (ref 39.0–52.0)
Hemoglobin: 12 g/dL — ABNORMAL LOW (ref 13.0–17.0)
Immature Granulocytes: 0 %
Lymphocytes Relative: 30 %
Lymphs Abs: 1.6 10*3/uL (ref 0.7–4.0)
MCH: 32.4 pg (ref 26.0–34.0)
MCHC: 32 g/dL (ref 30.0–36.0)
MCV: 101.4 fL — ABNORMAL HIGH (ref 80.0–100.0)
Monocytes Absolute: 0.4 10*3/uL (ref 0.1–1.0)
Monocytes Relative: 8 %
Neutro Abs: 3 10*3/uL (ref 1.7–7.7)
Neutrophils Relative %: 59 %
Platelets: 111 10*3/uL — ABNORMAL LOW (ref 150–400)
RBC: 3.7 MIL/uL — ABNORMAL LOW (ref 4.22–5.81)
RDW: 13.1 % (ref 11.5–15.5)
WBC: 5.1 10*3/uL (ref 4.0–10.5)
nRBC: 0 % (ref 0.0–0.2)

## 2023-01-24 LAB — IMMATURE PLATELET FRACTION: Immature Platelet Fraction: 7.3 % (ref 1.2–8.6)

## 2023-01-24 LAB — D-DIMER, QUANTITATIVE: D-Dimer, Quant: 0.91 ug/mL-FEU — ABNORMAL HIGH (ref 0.00–0.50)

## 2023-01-24 LAB — COMPREHENSIVE METABOLIC PANEL
ALT: 18 U/L (ref 0–44)
AST: 18 U/L (ref 15–41)
Albumin: 3.7 g/dL (ref 3.5–5.0)
Alkaline Phosphatase: 64 U/L (ref 38–126)
Anion gap: 8 (ref 5–15)
BUN: 27 mg/dL — ABNORMAL HIGH (ref 8–23)
CO2: 27 mmol/L (ref 22–32)
Calcium: 8.7 mg/dL — ABNORMAL LOW (ref 8.9–10.3)
Chloride: 103 mmol/L (ref 98–111)
Creatinine, Ser: 0.88 mg/dL (ref 0.61–1.24)
GFR, Estimated: >60 mL/min (ref >60)
Glucose, Bld: 134 mg/dL — ABNORMAL HIGH (ref 70–99)
Potassium: 4.2 mmol/L (ref 3.5–5.1)
Sodium: 138 mmol/L (ref 135–145)
Total Bilirubin: 0.8 mg/dL (ref 0.3–1.2)
Total Protein: 6.6 g/dL (ref 6.5–8.1)

## 2023-01-24 NOTE — Patient Instructions (Signed)
[facility] **VISIT SUMMARY & IMPORTANT INSTRUCTIONS **   You were seen today by Rojelio Brenner PA-C for your blood clot and low platelets.    BLOOD CLOTS Since you had symptoms of another blood clot, and evidence of blood clot in your left leg (either persistent from previous blood clot versus new blood clot), I recommend that you remain on Eliquis for the time being. Taking Eliquis does come with increased risk of bleeding.  It is extremely important that you practice safety at home to avoid falls. If you have any major bleeding events or fall and hit your head, seek immediate medical attention. Please see attached handout for additional information regarding Eliquis and home safety.  LOW PLATELETS We will recheck your platelets today.  Your platelets were lower than usual at your emergency department visit on 01/19/2023, but this may be related to your blood clot, since platelets can be "used up" in the setting of an acute blood clot.  FOLLOW-UP APPOINTMENT: We will plan on seeing you as scheduled in July 2024, unless you have any major abnormalities on your labs today.  ** Thank you for trusting me with your healthcare!  I strive to provide all of my patients with quality care at each visit.  If you receive a survey for this visit, I would be so grateful to you for taking the time to provide feedback.  Thank you in advance!  ~ Amol Domanski                   Dr. Doreatha Massed   &   Rojelio Brenner, PA-C   - - - - - - - - - - - - - - - - - -    Thank you for choosing [facility] at [facility] to provide your oncology and hematology care.  To afford each patient quality time with our provider, please arrive at least 15 minutes before your scheduled appointment time.   If you have a lab appointment with the Cancer Center please come in thru the Main Entrance and check in at the main information desk.  You need to  re-schedule your appointment should you arrive 10 or more minutes late.  We strive to give you quality time with our providers, and arriving late affects you and other patients whose appointments are after yours.  Also, if you no show three or more times for appointments you may be dismissed from the clinic at the providers discretion.     Again, thank you for choosing [facility].  Our hope is that these requests will decrease the amount of time that you wait before being seen by our physicians.       _____________________________________________________________  Should you have questions after your visit to [facility], please contact our office at [phone] and follow the prompts.  Our office hours are 8:00 a.m. and 4:30 p.m. Monday - Friday.  Please note that voicemails left after 4:00 p.m. may not be returned until the following business day.  We are closed weekends and major holidays.  You do have access to a nurse 24-7, just call the main number to the clinic [phone] and do not press any options, hold on the line and a nurse will answer the phone.    For prescription refill requests, have your pharmacy contact our office and allow 72 hours.

## 2023-01-27 ENCOUNTER — Other Ambulatory Visit: Payer: Self-pay

## 2023-01-27 DIAGNOSIS — D649 Anemia, unspecified: Secondary | ICD-10-CM

## 2023-01-27 DIAGNOSIS — D696 Thrombocytopenia, unspecified: Secondary | ICD-10-CM

## 2023-02-19 ENCOUNTER — Other Ambulatory Visit: Payer: Self-pay | Admitting: Adult Health

## 2023-02-19 DIAGNOSIS — E1149 Type 2 diabetes mellitus with other diabetic neurological complication: Secondary | ICD-10-CM

## 2023-02-20 NOTE — Telephone Encounter (Signed)
Pt has been schduled

## 2023-02-20 NOTE — Telephone Encounter (Signed)
Pt needs a dm f/u for further refills

## 2023-02-22 ENCOUNTER — Encounter (HOSPITAL_COMMUNITY): Payer: Self-pay

## 2023-02-22 ENCOUNTER — Emergency Department (HOSPITAL_COMMUNITY)
Admission: EM | Admit: 2023-02-22 | Discharge: 2023-02-22 | Disposition: A | Discharge status: Home / Self Care | Payer: Medicare Other | Attending: Emergency Medicine | Admitting: Emergency Medicine

## 2023-02-22 ENCOUNTER — Emergency Department (HOSPITAL_COMMUNITY): Payer: Medicare Other

## 2023-02-22 ENCOUNTER — Other Ambulatory Visit: Payer: Self-pay

## 2023-02-22 DIAGNOSIS — J449 Chronic obstructive pulmonary disease, unspecified: Secondary | ICD-10-CM | POA: Diagnosis not present

## 2023-02-22 DIAGNOSIS — R072 Precordial pain: Secondary | ICD-10-CM | POA: Diagnosis not present

## 2023-02-22 DIAGNOSIS — I2510 Atherosclerotic heart disease of native coronary artery without angina pectoris: Secondary | ICD-10-CM | POA: Diagnosis not present

## 2023-02-22 DIAGNOSIS — R079 Chest pain, unspecified: Secondary | ICD-10-CM | POA: Diagnosis not present

## 2023-02-22 DIAGNOSIS — Z7901 Long term (current) use of anticoagulants: Secondary | ICD-10-CM | POA: Diagnosis not present

## 2023-02-22 DIAGNOSIS — Z85828 Personal history of other malignant neoplasm of skin: Secondary | ICD-10-CM | POA: Diagnosis not present

## 2023-02-22 LAB — CBC
HCT: 34.6 % — ABNORMAL LOW (ref 39.0–52.0)
Hemoglobin: 11.3 g/dL — ABNORMAL LOW (ref 13.0–17.0)
MCH: 32.7 pg (ref 26.0–34.0)
MCHC: 32.7 g/dL (ref 30.0–36.0)
MCV: 100 fL (ref 80.0–100.0)
Platelets: 96 10*3/uL — ABNORMAL LOW (ref 150–400)
RBC: 3.46 MIL/uL — ABNORMAL LOW (ref 4.22–5.81)
RDW: 13.2 % (ref 11.5–15.5)
WBC: 6 10*3/uL (ref 4.0–10.5)
nRBC: 0 % (ref 0.0–0.2)

## 2023-02-22 LAB — TROPONIN I (HIGH SENSITIVITY)
Troponin I (High Sensitivity): 9 ng/L (ref <18)
Troponin I (High Sensitivity): 9 ng/L (ref <18)

## 2023-02-22 LAB — BASIC METABOLIC PANEL
Anion gap: 7 (ref 5–15)
BUN: 28 mg/dL — ABNORMAL HIGH (ref 8–23)
CO2: 26 mmol/L (ref 22–32)
Calcium: 8.7 mg/dL — ABNORMAL LOW (ref 8.9–10.3)
Chloride: 105 mmol/L (ref 98–111)
Creatinine, Ser: 0.94 mg/dL (ref 0.61–1.24)
GFR, Estimated: >60 mL/min (ref >60)
Glucose, Bld: 142 mg/dL — ABNORMAL HIGH (ref 70–99)
Potassium: 4.1 mmol/L (ref 3.5–5.1)
Sodium: 138 mmol/L (ref 135–145)

## 2023-02-22 NOTE — Discharge Instructions (Signed)

## 2023-02-22 NOTE — ED Provider Notes (Signed)
Emergency Department Provider Note   I have reviewed the triage vital signs and the nursing notes.   HISTORY  Chief Complaint Chest Pain   HPI Michael Johnston is a 89 y.o. male with PMH of COPD, CAD, and HLD presents to the ED CP.  Patient was out working in the garden this morning and came back inside to eat breakfast, at which point he experienced sudden onset pressure in his chest.  No shortness of breath.  He alerted family who gave 324 mg of aspirin and 1 nitroglycerin, resolving his pain.  He is prior history of bypass with his last cath in 2018.  He is followed by [facility] for cardiology.  He is compliant with his anticoagulation. No active pain. Does not recall if this feels similar to prior ACS pain.   Past Medical History:  Diagnosis Date   Arthritis    "right shoulder" (05/24/2015)   CHEST PAIN    Chronic bronchitis (HCC)    "get it ~ q yr" (05/24/2015)   COLONIC POLYPS, HX OF    COPD (chronic obstructive pulmonary disease) (HCC)    "dx'd but I don't take RX for it" (05/24/2015)   Coronary artery disease    Cystic kidney disease    GERD (gastroesophageal reflux disease)    History of hiatal hernia    MI (myocardial infarction) (HCC) 09/2010   Mixed hyperlipidemia    Skin cancer    "top of my head only" (05/24/2015)   Sleep apnea    Syncope and collapse ~ 2013; 05/24/2015    Review of Systems  Constitutional: No fever/chills Cardiovascular: Positive chest pain. Respiratory: Denies shortness of breath. Gastrointestinal: No abdominal pain.  No nausea, no vomiting.  No diarrhea.  No constipation. Genitourinary: Negative for dysuria. Musculoskeletal: Negative for back pain. Skin: Negative for rash. Neurological: Negative for headaches, focal weakness or numbness.  ____________________________________________   PHYSICAL EXAM:  VITAL SIGNS: ED Triage Vitals  Enc Vitals Group     BP 02/22/23 1159 138/69     Pulse Rate 02/22/23 1159 (!) 55     Resp 02/22/23  1159 18     Temp 02/22/23 1159 97.6 F (36.4 C)     Temp Source 02/22/23 1159 Oral     SpO2 02/22/23 1159 100 %     Weight 02/22/23 1157 165 lb (74.8 kg)     Height 02/22/23 1157 5\' 9"  (1.753 m)   Constitutional: Alert and oriented. Well appearing and in no acute distress. Eyes: Conjunctivae are normal.  Head: Atraumatic. Nose: No congestion/rhinnorhea. Mouth/Throat: Mucous membranes are moist.  Neck: No stridor.  Cardiovascular: Normal rate, regular rhythm. Good peripheral circulation. Grossly normal heart sounds.   Respiratory: Normal respiratory effort.  No retractions. Lungs CTAB. Gastrointestinal: Soft and nontender. No distention.  Musculoskeletal: No gross deformities of extremities. Neurologic:  Normal speech and language.  Skin:  Skin is warm, dry and intact. No rash noted.  ____________________________________________   LABS (all labs ordered are listed, but only abnormal results are displayed)  Labs Reviewed  BASIC METABOLIC PANEL - Abnormal; Notable for the following components:      Result Value   Glucose, Bld 142 (*)    BUN 28 (*)    Calcium 8.7 (*)    All other components within normal limits  CBC - Abnormal; Notable for the following components:   RBC 3.46 (*)    Hemoglobin 11.3 (*)    HCT 34.6 (*)    Platelets 96 (*)  All other components within normal limits  TROPONIN I (HIGH SENSITIVITY)  TROPONIN I (HIGH SENSITIVITY)   ____________________________________________  EKG   EKG Interpretation  Date/Time:  Saturday February 22 2023 12:10:00 EDT Ventricular Rate:  51 PR Interval:  72 QRS Duration: 150 QT Interval:  486 QTC Calculation: 448 R Axis:   -76 Text Interpretation: Sinus bradycardia Short PR interval RBBB and LAFB Baseline wander in lead(s) I II aVR Confirmed by Alona Bene [phone]) on 02/22/2023 12:15:27 PM        ____________________________________________  RADIOLOGY  DG Chest 2 View  Result Date: 02/22/2023 CLINICAL DATA:   Chest pain EXAM: CHEST - 2 VIEW COMPARISON:  X-ray 09/07/2019.  CT angiogram 02/08/2022 FINDINGS: Status post median sternotomy. Normal cardiopericardial silhouette. Calcified aorta. Hyperinflation. Overlapping cardiac leads in the right lung is clear. No consolidation, pneumothorax or effusion. Left is without pneumothorax or consolidation. There is some blunting of the left costophrenic angle. Tiny effusion versus pleural thickening. Curvature of the spine IMPRESSION: Postop chest. Small left effusion versus pleural thickening. No edema or consolidation Electronically Signed   By: Karen Kays M.D.   On: 02/22/2023 12:38    ____________________________________________   PROCEDURES  Procedure(s) performed:   Procedures  None  ____________________________________________   INITIAL IMPRESSION / ASSESSMENT AND PLAN / ED COURSE  Pertinent labs & imaging results that were available during my care of the patient were reviewed by me and considered in my medical decision making (see chart for details).   This patient is Presenting for Evaluation of CP, which does require a range of treatment options, and is a complaint that involves a high risk of morbidity and mortality.  The Differential Diagnoses includes but is not exclusive to acute coronary syndrome, aortic dissection, pulmonary embolism, cardiac tamponade, community-acquired pneumonia, pericarditis, musculoskeletal chest wall pain, etc.  I did obtain Additional Historical Information from son at bedside.  I decided to review pertinent External Data, and in summary last cath from 2018 reviewed.   Clinical Laboratory Tests Ordered, included troponin negative x 2. No AKI. Mild anemia.   Radiologic Tests Ordered, included CXR. I independently interpreted the images and agree with radiology interpretation.   Cardiac Monitor Tracing which shows bradycardia.    Social Determinants of Health Risk patient is not an active smoker.   Medical  Decision Making: Summary:  Patient presents emergency department for evaluation of chest discomfort this morning.  No active pain upon arrival to the ED, after nitroglycerin and aspirin prior to arrival.  Does have ACS history.  Was off of his Eliquis briefly within the past 3 months but has since restarted.  Suspicion for PE is low based on history and vitals.   Reevaluation with update and discussion with patient and son at bedside. Remains CP-free. Troponin negative x 2. Stable for discharge. He will call Cardiology office on Monday to make them aware of ED visit and arrange follow up in the coming week.   Considered admission but workup is reassuring. Stable for outpatient follow up.   Patient's presentation is most consistent with acute presentation with potential threat to life or bodily function.   Disposition: discharge  ____________________________________________  FINAL CLINICAL IMPRESSION(S) / ED DIAGNOSES  Final diagnoses:  Precordial chest pain    Note:  This document was prepared using Dragon voice recognition software and may include unintentional dictation errors.  Alona Bene, MD, [facility] Emergency [facility]    Gunner Iodice, Arlyss Repress, MD 02/22/23 [phone]

## 2023-02-22 NOTE — ED Triage Notes (Addendum)
Pt arrived POV from home c/o left sided chest pain that does not radiate that started at 10am. Pt also denies any SHOB, N/V. Initially the pain was an 8/10. Pt took 324 of ASA and 1 nitroglycerin and currently has no pain.

## 2023-02-27 ENCOUNTER — Encounter: Payer: Self-pay | Admitting: Adult Health

## 2023-02-27 ENCOUNTER — Ambulatory Visit (INDEPENDENT_AMBULATORY_CARE_PROVIDER_SITE_OTHER): Payer: Medicare Other | Admitting: Adult Health

## 2023-02-27 DIAGNOSIS — E1149 Type 2 diabetes mellitus with other diabetic neurological complication: Secondary | ICD-10-CM

## 2023-02-27 DIAGNOSIS — I10 Essential (primary) hypertension: Secondary | ICD-10-CM | POA: Diagnosis not present

## 2023-02-27 DIAGNOSIS — Z7984 Long term (current) use of oral hypoglycemic drugs: Secondary | ICD-10-CM | POA: Diagnosis not present

## 2023-02-27 LAB — POCT GLYCOSYLATED HEMOGLOBIN (HGB A1C): Hemoglobin A1C: 6.3 % — AB (ref 4.0–5.6)

## 2023-02-27 MED ORDER — METFORMIN HCL 500 MG PO TABS
250.0000 mg | ORAL_TABLET | Freq: Every day | ORAL | 1 refills | Status: DC
Start: 2023-02-27 — End: 2023-05-30

## 2023-02-27 NOTE — Patient Instructions (Addendum)
Your A1c was 6.3 - this improved. I am going to decrease your Metformin dose to 1/2 tab daily.    Follow up after December 13th for your annual physical and we will recheck you A1c at this time.

## 2023-02-27 NOTE — Progress Notes (Signed)
Subjective:    Patient ID: Michael Johnston, male    DOB: 1932/05/27, 89 y.o.   MRN: [id number]  HPI 89 year old male who  has a past medical history of Arthritis, CHEST PAIN, Chronic bronchitis (HCC), COLONIC POLYPS, HX OF, COPD (chronic obstructive pulmonary disease) (HCC), Coronary artery disease, Cystic kidney disease, GERD (gastroesophageal reflux disease), History of hiatal hernia, MI (myocardial infarction) (HCC) (09/2010), Mixed hyperlipidemia, Skin cancer, Sleep apnea, and Syncope and collapse (~ 2013; 05/24/2015).  He presents to the office today for follow up regarding DM and HTN   Diabetes mellitus type 2-managed with metformin 500 mg daily.  He does not check his blood sugar at home on a routine basis but denies episodes of hypoglycemia.  He denies nausea, vomiting, diarrhea. His previous A1c was 6.9   Hypertension-managed with metoprolol 25 mg twice daily.  He denies dizziness, lightheadedness, chest pain, or shortness of breath. BP Readings from Last 3 Encounters:  02/27/23 (!) 110/58  02/22/23 (!) 163/66  01/24/23 121/61    Review of Systems  See HPI    Past Medical History:  Diagnosis Date   Arthritis    "right shoulder" (05/24/2015)   CHEST PAIN    Chronic bronchitis (HCC)    "get it ~ q yr" (05/24/2015)   COLONIC POLYPS, HX OF    COPD (chronic obstructive pulmonary disease) (HCC)    "dx'd but I don't take RX for it" (05/24/2015)   Coronary artery disease    Cystic kidney disease    GERD (gastroesophageal reflux disease)    History of hiatal hernia    MI (myocardial infarction) (HCC) 09/2010   Mixed hyperlipidemia    Skin cancer    "top of my head only" (05/24/2015)   Sleep apnea    Syncope and collapse ~ 2013; 05/24/2015    Social History   Socioeconomic History   Marital status: Married    Spouse name: Not on file   Number of children: Not on file   Years of education: Not on file   Highest education level: Not on file  Occupational History   Not  on file  Tobacco Use   Smoking status: Former    Packs/day: 1.50    Years: 3.00    Additional pack years: 0.00    Total pack years: 4.50    Types: Cigarettes    Quit date: 12/09/1951    Years since quitting: 71.2   Smokeless tobacco: Never  Vaping Use   Vaping Use: Never used  Substance and Sexual Activity   Alcohol use: Not Currently    Comment: "no alcohol since 1953"   Drug use: No   Sexual activity: Not on file  Other Topics Concern   Not on file  Social History Narrative   Retired    Is a Education officer, 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 [place]    Social Determinants of [employer] Strain: Low Risk  (10/16/2021)   Overall Financial Resource Strain (CARDIA)    Difficulty of Paying Living Expenses: Not hard at all  Food Insecurity: No Food Insecurity (10/16/2021)   Hunger Vital Sign    Worried About Running Out of Food in the Last Year: Never true    Ran Out of Food in the Last Year: Never true  Transportation Needs: No Transportation Needs (10/16/2021)   PRAPARE - Administrator, Civil Service (Medical): No    Lack of Transportation (Non-Medical): No  Physical Activity: Insufficiently Active (10/16/2021)  Exercise Vital Sign    Days of Exercise per Week: 2 days    Minutes of Exercise per Session: 40 min  Stress: No Stress Concern Present (10/16/2021)   [employer] of Occupational Health - Occupational Stress Questionnaire    Feeling of Stress : Not at all  Social Connections: Socially Integrated (10/16/2021)   Social Connection and Isolation Panel [NHANES]    Frequency of Communication with Friends and Family: More than three times a week    Frequency of Social Gatherings with Friends and Family: More than three times a week    Attends Religious Services: More than 4 times per year    Active Member of [employer] or Organizations: Yes    Attends Engineer, structural: More than 4 times per year    Marital Status: Married  Catering manager Violence: Not At Risk  (10/16/2021)   Humiliation, Afraid, Rape, and Kick questionnaire    Fear of Current or Ex-Partner: No    Emotionally Abused: No    Physically Abused: No    Sexually Abused: No    Past Surgical History:  Procedure Laterality Date   CARDIAC CATHETERIZATION  09/2010   CHOLECYSTECTOMY OPEN  1998   COLECTOMY  1998   "partial"   CORONARY ANGIOPLASTY     CORONARY ARTERY BYPASS GRAFT  09/2010   Median sternotomy for coronary artery bypass grafting x3  (left internal mammary artery to distal left anterior  descending  coronary artery, saphenous vein graft to first diagonal branch,  saphenous vein graft to first  obtuse marginal branch, endoscopic  saphenous vein harvest from right thigh). SURGEON:  Salvatore Decent.  Cornelius Moras, MD  ASSISTANT:  Rowe Clack, PA-C  ANESTHESIA:  Guadalupe Maple, MD    LEFT HEART CATH AND CORS/GRAFTS ANGIOGRAPHY N/A 08/22/2017   Procedure: LEFT HEART CATH AND CORS/GRAFTS ANGIOGRAPHY;  Surgeon: [place], Peter M, MD;  Location: [facility] INVASIVE CV LAB;  Service: Cardiovascular;  Laterality: N/A;   SKIN CANCER EXCISION     "top of my head"    Family History  Problem Relation Age of Onset   Lung cancer Sister    Heart attack Father     Allergies  Allergen Reactions   Levaquin [Levofloxacin In D5w] Swelling and Other (See Comments)    Eyes swollen   Other Anaphylaxis and Other (See Comments)    Bee sting   Ivp Dye [Iodinated Contrast Media] Other (See Comments)    Patient reports seizure   Metformin And Related Nausea Only    Current Outpatient Medications on File Prior to Visit  Medication Sig Dispense Refill   aspirin 81 MG tablet Take 81 mg by mouth daily.     atorvastatin (LIPITOR) 20 MG tablet TAKE 1 TABLET BY MOUTH EVERY DAY (Patient taking differently: Take 20 mg by mouth daily.) 90 tablet 3   cyanocobalamin (VITAMIN B12) 1000 MCG/ML injection INJECT 1 ML INTRAMUSCULARLY EVERY OTHER WEEK (Patient taking differently: Inject 1,000 mcg into the muscle See admin  instructions. INJECT 1 ML INTRAMUSCULARLY EVERY OTHER WEEK) 3 mL 6   diclofenac Sodium (VOLTAREN) 1 % GEL Apply 2 g topically 4 (four) times daily as needed (For pain). 150 g 2   ELIQUIS 5 MG TABS tablet TAKE 1 TABLET BY MOUTH TWICE A DAY 60 tablet 0   EPINEPHrine 0.3 mg/0.3 mL IJ SOAJ injection Inject 0.3 mg into the muscle once as needed for anaphylaxis.     ketoconazole (NIZORAL) 2 % shampoo USE TWICE WEEKLY (Patient taking  differently: Apply 1 Application topically 2 (two) times a week.) 120 mL 3   metFORMIN (GLUCOPHAGE) 500 MG tablet TAKE 1 TABLET BY MOUTH EVERY DAY WITH BREAKFAST (Patient taking differently: Take 500 mg by mouth daily with breakfast.) 90 tablet 1   metoprolol tartrate (LOPRESSOR) 25 MG tablet TAKE 1 TABLET BY MOUTH TWICE A DAY 180 tablet 3   nitroGLYCERIN (NITROSTAT) 0.4 MG SL tablet Place 1 tablet (0.4 mg total) under the tongue every 5 (five) minutes as needed for chest pain (3 doses max). 25 tablet 1   SYRINGE-NEEDLE, DISP, 3 ML (B-D 3CC LUER-LOK SYR 25GX1") 25G X 1" 3 ML MISC USE AS DIRECTED TO INJECT B12 3 each 3   No current facility-administered medications on file prior to visit.    BP (!) 110/58   Pulse 61   Temp 97.9 F (36.6 C) (Oral)   Ht 5\' 9"  (1.753 m)   Wt 168 lb (76.2 kg)   SpO2 98%   BMI 24.81 kg/m       Objective:   Physical Exam Vitals and nursing note reviewed.  Constitutional:      Appearance: Normal appearance.  Cardiovascular:     Rate and Rhythm: Normal rate and regular rhythm.     Pulses: Normal pulses.     Heart sounds: Normal heart sounds.  Pulmonary:     Effort: Pulmonary effort is normal.     Breath sounds: Normal breath sounds.  Skin:    General: Skin is warm and dry.  Neurological:     General: No focal deficit present.     Mental Status: He is alert and oriented to person, place, and time.  Psychiatric:        Mood and Affect: Mood normal.        Behavior: Behavior normal.        Thought Content: Thought content  normal.        Judgment: Judgment normal.        Assessment & Plan:  1. Type 2 diabetes mellitus with neurological complications (HCC)  - POC HgB A1c- 6.3  - Will decrease his dose to 250 mg daily.  - We will recheck his A1c at his CPE in 6 months and can consider d/c Metformin  - metFORMIN (GLUCOPHAGE) 500 MG tablet; Take 0.5 tablets (250 mg total) by mouth daily with breakfast.  Dispense: 45 tablet; Refill: 1  2. Essential hypertension - No change in medication   Shirline Frees, NP

## 2023-03-03 ENCOUNTER — Telehealth: Payer: Self-pay

## 2023-03-03 NOTE — Telephone Encounter (Signed)
Transition Care Management Follow-up Telephone Call Date of discharge and from where: 02/22/2023 [facility] How have you been since you were released from the hospital? Patient stated he is feeling better Any questions or concerns? No  Items Reviewed: Did the pt receive and understand the discharge instructions provided? Yes  Medications obtained and verified? Yes  Other? No  Any new allergies since your discharge? No  Dietary orders reviewed? Yes Do you have support at home? Yes   Follow up appointments reviewed:  PCP Hospital f/u appt confirmed? Yes  Scheduled to see Shirline Frees, MD on 02/27/2023 @ [facility] [place]. Specialist Hospital f/u appt confirmed? No  Scheduled to see  on  @ . Are transportation arrangements needed? No  If their condition worsens, is the pt aware to call PCP or go to the Emergency Dept.? Yes Was the patient provided with contact information for the PCP's office or ED? Yes Was to pt encouraged to call back with questions or concerns? Yes  Mandi Mattioli [facility]  [facility] Population Health Community Resource Care Guide   [email or web address]  ?? [phone]   Website: [email or web address]  [email or web address]

## 2023-03-03 NOTE — Telephone Encounter (Signed)
Transition Care Management Unsuccessful Follow-up Telephone Call  Date of discharge and from where:  02/22/2023 [facility]  Attempts:  1st Attempt  Reason for unsuccessful TCM follow-up call:  Left voice message  [facility]  [facility] Population Health Community Resource Care Guide   [name]  ?? [phone]   Website: [email or web address]  [email or web address]

## 2023-03-12 ENCOUNTER — Inpatient Hospital Stay: Payer: Medicare Other | Attending: Physician Assistant

## 2023-03-12 ENCOUNTER — Inpatient Hospital Stay: Payer: Medicare Other

## 2023-03-12 DIAGNOSIS — I82432 Acute embolism and thrombosis of left popliteal vein: Secondary | ICD-10-CM | POA: Diagnosis not present

## 2023-03-12 DIAGNOSIS — Z85828 Personal history of other malignant neoplasm of skin: Secondary | ICD-10-CM | POA: Insufficient documentation

## 2023-03-12 DIAGNOSIS — D649 Anemia, unspecified: Secondary | ICD-10-CM | POA: Diagnosis not present

## 2023-03-12 DIAGNOSIS — I2510 Atherosclerotic heart disease of native coronary artery without angina pectoris: Secondary | ICD-10-CM | POA: Diagnosis not present

## 2023-03-12 DIAGNOSIS — D696 Thrombocytopenia, unspecified: Secondary | ICD-10-CM | POA: Insufficient documentation

## 2023-03-12 LAB — IRON AND TIBC
Iron: 111 ug/dL (ref 45–182)
Saturation Ratios: 40 % — ABNORMAL HIGH (ref 17.9–39.5)
TIBC: 277 ug/dL (ref 250–450)
UIBC: 166 ug/dL

## 2023-03-12 LAB — FERRITIN: Ferritin: 57 ng/mL (ref 24–336)

## 2023-03-12 LAB — CBC WITH DIFFERENTIAL/PLATELET
Abs Immature Granulocytes: 0.01 10*3/uL (ref 0.00–0.07)
Basophils Absolute: 0.1 10*3/uL (ref 0.0–0.1)
Basophils Relative: 1 %
Eosinophils Absolute: 0.1 10*3/uL (ref 0.0–0.5)
Eosinophils Relative: 2 %
HCT: 37.2 % — ABNORMAL LOW (ref 39.0–52.0)
Hemoglobin: 12.4 g/dL — ABNORMAL LOW (ref 13.0–17.0)
Immature Granulocytes: 0 %
Lymphocytes Relative: 32 %
Lymphs Abs: 1.7 10*3/uL (ref 0.7–4.0)
MCH: 32.5 pg (ref 26.0–34.0)
MCHC: 33.3 g/dL (ref 30.0–36.0)
MCV: 97.4 fL (ref 80.0–100.0)
Monocytes Absolute: 0.4 10*3/uL (ref 0.1–1.0)
Monocytes Relative: 8 %
Neutro Abs: 3.1 10*3/uL (ref 1.7–7.7)
Neutrophils Relative %: 57 %
Platelets: 106 10*3/uL — ABNORMAL LOW (ref 150–400)
RBC: 3.82 MIL/uL — ABNORMAL LOW (ref 4.22–5.81)
RDW: 13.1 % (ref 11.5–15.5)
WBC: 5.4 10*3/uL (ref 4.0–10.5)
nRBC: 0 % (ref 0.0–0.2)

## 2023-03-12 LAB — COMPREHENSIVE METABOLIC PANEL
ALT: 19 U/L (ref 0–44)
AST: 20 U/L (ref 15–41)
Albumin: 4 g/dL (ref 3.5–5.0)
Alkaline Phosphatase: 56 U/L (ref 38–126)
Anion gap: 7 (ref 5–15)
BUN: 27 mg/dL — ABNORMAL HIGH (ref 8–23)
CO2: 26 mmol/L (ref 22–32)
Calcium: 9.2 mg/dL (ref 8.9–10.3)
Chloride: 104 mmol/L (ref 98–111)
Creatinine, Ser: 0.85 mg/dL (ref 0.61–1.24)
GFR, Estimated: >60 mL/min (ref >60)
Glucose, Bld: 140 mg/dL — ABNORMAL HIGH (ref 70–99)
Potassium: 4.4 mmol/L (ref 3.5–5.1)
Sodium: 137 mmol/L (ref 135–145)
Total Bilirubin: 0.6 mg/dL (ref 0.3–1.2)
Total Protein: 7 g/dL (ref 6.5–8.1)

## 2023-03-12 LAB — LACTATE DEHYDROGENASE: LDH: 155 U/L (ref 98–192)

## 2023-03-18 NOTE — Progress Notes (Unsigned)
[facility] [address], [place] [address]   CLINIC:  Medical Oncology/Hematology  PCP:  Shirline Frees, NP [address] 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 Michael Johnston 89 y.o. male returns for routine follow-up of his DVT and PE, on chronic anticoagulation with Eliquis.  He was last seen by Rojelio Brenner PA-C on 01/24/2023.  At today's visit, he reports feeling fair.  He has not had any hospitalizations or major changes in his health since his last visit.  He continues to take Eliquis 5 mg twice daily, and is tolerating this well.   He denies any major bleeding events, epistaxis, hematemesis, or melena.  He bruises easily, but denies any petechial rash.  His left leg edema has resolved and returned to normal after he was restarted on Eliquis 2 months ago.  He had ED visit for chest pain in June 2024, with negative cardiac workup; patient thinks chest pain was due to inhaling pesticides while he was gardening.  He denies any recurrent chest pain.  No dyspnea with exertion, shortness of breath at rest, or hemoptysis.  He denies any B symptoms such as fever, chills, night sweats, unintentional weight loss.  He is followed once in the past 6 months (early May 2024), ambulates with cane for stability.   He has 25% energy and 90% appetite.  His weight has overall been stable, and he reports weight range at home fluctuating between 164-169 pounds.  ASSESSMENT & PLAN:  1.  Unprovoked bilateral DVT and bilateral PE - Seen  at the request of hospitalist (Dr. Blake Divine) and PCP (NP Shirline Frees) for evaluation of bilateral DVT and bilateral pulmonary embolism. -  Patient was seen by his PCP on 02/07/2021 for complaints of bilateral lower extremity edema.  D-dimer found to be significantly elevated at 11.01, therefore patient was sent to the  ED where he was found to have bilateral age-indeterminate DVT with acute bilateral PE Vascular US bilateral lower extremity venous (02/08/2022): Age-indeterminate DVT of bilateral lower extremities (right posterior tibial and peroneal veins + left popliteal vein, posterior tibial vein, and left peroneal veins) CTA chest (02/08/2022): Acute subsegmental pulmonary emboli in right upper and lower lobe, small clot burden 2D echo (02/09/2022): LVEF 60 to 65%, mildly reduced RV systolic function - Initially treated with IV heparin while hospitalized, transitioned to oral Eliquis at discharge. - Patient remained on Eliquis through 01/09/2023, and opted to discontinue due to age, fall risk, and concerns regarding cost. - Patient stopped taking Eliquis as of 01/09/2023 - subsequently presented to the ED on 01/20/2023 with acute left leg swelling and pain.  LLE venous ultrasound (01/20/2023) was positive for DVT in left popliteal vein and posterior tibial vein.  He was restarted on Eliquis during ED visit on 01/20/2023. - LLE venous US from 01/20/2023 did not comment on chronicity of DVT of left popliteal vein and posterior tibial vein (and there had been no interval imaging since the first of DVTs were diagnosed in June 2023).  However, this is suspected to be acute recurrent DVT or extension of chronic DVT based on acute symptomatology, elevated D-dimer (0.91 on 01/24/2023), temporal relationship to discontinuation of anticoagulation, and acute drop in platelets. - No obvious provoking factors of his DVT/PE events. - He denies any major bleeding events - Patient has had 1 fall within the past  6 months, denies any major injury  - PLAN: Since patient had apparent recurrent DVT after discontinuation of Eliquis, recommend continuing indefinite anticoagulation until such time that risk of bleeding outweighs risk of clotting events. - We discussed risks and benefits of prolonged anticoagulation, including risk of life-threatening  bleeding event, which is considered to be less than the risk of recurrent blood clot if he were not on anticoagulation. - We discussed patient's risk of falls and education on home safety was provided. - Patient remains eligible for Eliquis 5 mg twice daily dosing (based on age >80 years, we would consider decreasing to 2.5 mg twice daily if he had body weight <60 kg or serum creatinine >1.5 mg) - No utility for hypercoagulable studies, since this would not change management. - We we will check bilateral venous US in 3 months   2.  Thrombocytopenia (mild)  - Mild intermittent thrombocytopenia since at least 2012.  Baseline platelets usually >100, but with transient worsening of thrombocytopenia during hospital stays. - CT abdomen/pelvis (09/15/2020): Liver and spleen unremarkable. - Hematology workup (09/18/2022): CMP unremarkable. Normal copper, folate, homocystine, B12 and MMA. Ferritin 78, iron saturation 33%. Immature platelet fraction 7.3 - Reports easy bruising.  No petechial rash, bleeding, or B symptoms. - Acute worsening of thrombocytopenia with platelets 65 on 01/19/2023, likely due to platelet consumption in the setting of acute DVT.  Platelets improved to 111 as of 01/24/2023. - Most recent labs (03/12/2023): Platelets 106.  Normal LDH. - DIFFERENTIAL DIAGNOSIS immune mediated thrombocytopenia, or bone marrow infiltrative process such as MDS - PLAN: No treatment at this time.  Continue surveillance.   - Check ANA and rheumatoid factor with next lab draw to complete autoimmune workup.   - It is reasonable to continue patient on Eliquis as long as his platelets remain >50,000. - If major deviations from baseline, would consider bone marrow biopsy.  Patient prefers to defer BMBx at this time.  3.  Anemia, normocytic/macrocytic - Mild to moderate anemia since at least 2012.  Baseline Hgb for the past year around 11.0-13.0. - Most recent labs (03/12/2023): Hgb 12.4/MCV 97.4.  Ferritin 57, iron  saturation 40%.  Creatinine 0.85/GFR >60.  Normal LDH and bilirubin. - He had normal B12, MMA, folate, copper, homocystine in January 2024. - He takes vitamin B12 injection at home every 2 weeks, prescribed by PCP.  - Denies any rectal bleeding, melena, or epistaxis. - DIFFERENTIAL DIAGNOSIS includes iron deficiency anemia, anemia of chronic disease, or bone marrow infiltrative process - PLAN: Anemia is mild and overall stable for the last 10+ years. - Recommend starting iron tablet daily to see if anemia will improve with optimization of ferritin.   - Repeat labs and RTC in 3 months.  We will check additional labs at that time to include MGUS/myeloma labs, erythropoietin, and reticulocytes.   - If major deviations from baseline, would consider bone marrow biopsy.  Patient prefers to defer BMBx at this time.  4.  Other history - PMH: Coronary artery disease s/p CABG, cystic kidney disease, GERD, hyperlipidemia, skin cancer, sleep apnea (does not use CPAP), and arthritis. - SOCIAL: Patient lives at home with his wife of 67 years.  He is very active.  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  He smoked when he was a teenager.  He denies any alcohol or illicit drug use. - FAMILY: Family history is negative for any blood clots or known hypercoagulable states.  Sister passed  away from unspecified cancer.  PLAN SUMMARY: >> Bilateral venous US in 3 months >> Labs in 3 months = CBC/D, CMP, D-dimer, LDH, ANA, rheumatoid factor, ferritin, iron/TIBC, SPEP, light chains, immunofixation, erythropoietin, reticulocytes, B12, MMA, folate >> OFFICE visit in 3 months (1 week after labs)      REVIEW OF SYSTEMS:  Review of Systems  Constitutional:  Positive for fatigue. Negative for appetite change, chills, diaphoresis, fever and unexpected weight change.  HENT:   Positive for trouble swallowing (difficulty chewing). Negative for lump/mass and nosebleeds.   Eyes:  Negative for eye  problems.  Respiratory:  Negative for cough, hemoptysis and shortness of breath.   Cardiovascular:  Negative for chest pain, leg swelling and palpitations.  Gastrointestinal:  Negative for abdominal pain, blood in stool, constipation, diarrhea, nausea and vomiting.  Genitourinary:  Negative for hematuria.   Musculoskeletal:  Positive for gait problem.  Skin: Negative.   Neurological:  Positive for dizziness, gait problem and numbness (tingling in hands). Negative for headaches and light-headedness.  Hematological:  Does not bruise/bleed easily.     PHYSICAL EXAM:  ECOG PERFORMANCE STATUS: 1 - Symptomatic but completely ambulatory  There were no vitals filed for this visit. There were no vitals filed for this visit. Physical Exam Constitutional:      Appearance: Normal appearance. He is normal weight.  Cardiovascular:     Heart sounds: Normal heart sounds.  Pulmonary:     Breath sounds: Normal breath sounds.  Neurological:     General: No focal deficit present.     Mental Status: Mental status is at baseline.  Psychiatric:        Behavior: Behavior normal. Behavior is cooperative.    PAST MEDICAL/SURGICAL HISTORY:  Past Medical History:  Diagnosis Date   Arthritis    "right shoulder" (05/24/2015)   CHEST PAIN    Chronic bronchitis (HCC)    "get it ~ q yr" (05/24/2015)   COLONIC POLYPS, HX OF    COPD (chronic obstructive pulmonary disease) (HCC)    "dx'd but I don't take RX for it" (05/24/2015)   Coronary artery disease    Cystic kidney disease    GERD (gastroesophageal reflux disease)    History of hiatal hernia    MI (myocardial infarction) (HCC) 09/2010   Mixed hyperlipidemia    Skin cancer    "top of my head only" (05/24/2015)   Sleep apnea    Syncope and collapse ~ 2013; 05/24/2015   Past Surgical History:  Procedure Laterality Date   CARDIAC CATHETERIZATION  09/2010   CHOLECYSTECTOMY OPEN  1998   COLECTOMY  1998   "partial"   CORONARY ANGIOPLASTY     CORONARY  ARTERY BYPASS GRAFT  09/2010   Median sternotomy for coronary artery bypass grafting x3  (left internal mammary artery to distal left anterior  descending  coronary artery, saphenous vein graft to first diagonal branch,  saphenous vein graft to first  obtuse marginal branch, endoscopic  saphenous vein harvest from right thigh). SURGEON:  Salvatore Decent.  Cornelius Moras, MD  ASSISTANT:  Rowe Clack, PA-C  ANESTHESIA:  Guadalupe Maple, MD    LEFT HEART CATH AND CORS/GRAFTS ANGIOGRAPHY N/A 08/22/2017   Procedure: LEFT HEART CATH AND CORS/GRAFTS ANGIOGRAPHY;  Surgeon: [place], Peter M, MD;  Location: [facility] INVASIVE CV LAB;  Service: Cardiovascular;  Laterality: N/A;   SKIN CANCER EXCISION     "top of my head"    SOCIAL HISTORY:  Social History   Socioeconomic History  Marital status: Married    Spouse name: Not on file   Number of children: Not on file   Years of education: Not on file   Highest education level: Not on file  Occupational History   Not on file  Tobacco Use   Smoking status: Former    Packs/day: 1.50    Years: 3.00    Additional pack years: 0.00    Total pack years: 4.50    Types: Cigarettes    Quit date: 12/09/1951    Years since quitting: 71.3   Smokeless tobacco: Never  Vaping Use   Vaping Use: Never used  Substance and Sexual Activity   Alcohol use: Not Currently    Comment: "no alcohol since 1953"   Drug use: No   Sexual activity: Not on file  Other Topics Concern   Not on file  Social History Narrative   Retired    Is a Education officer, 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 [place]    Social Determinants of [employer] Strain: Low Risk  (10/16/2021)   Overall Financial Resource Strain (CARDIA)    Difficulty of Paying Living Expenses: Not hard at all  Food Insecurity: No Food Insecurity (10/16/2021)   Hunger Vital Sign    Worried About Running Out of Food in the Last Year: Never true    Ran Out of Food in the Last Year: Never true  Transportation Needs: No Transportation Needs (10/16/2021)    PRAPARE - Administrator, Civil Service (Medical): No    Lack of Transportation (Non-Medical): No  Physical Activity: Insufficiently Active (10/16/2021)   Exercise Vital Sign    Days of Exercise per Week: 2 days    Minutes of Exercise per Session: 40 min  Stress: No Stress Concern Present (10/16/2021)   [employer] of Occupational Health - Occupational Stress Questionnaire    Feeling of Stress : Not at all  Social Connections: Socially Integrated (10/16/2021)   Social Connection and Isolation Panel [NHANES]    Frequency of Communication with Friends and Family: More than three times a week    Frequency of Social Gatherings with Friends and Family: More than three times a week    Attends Religious Services: More than 4 times per year    Active Member of [employer] or Organizations: Yes    Attends Engineer, structural: More than 4 times per year    Marital Status: Married  Catering manager Violence: Not At Risk (10/16/2021)   Humiliation, Afraid, Rape, and Kick questionnaire    Fear of Current or Ex-Partner: No    Emotionally Abused: No    Physically Abused: No    Sexually Abused: No    FAMILY HISTORY:  Family History  Problem Relation Age of Onset   Lung cancer Sister    Heart attack Father     CURRENT MEDICATIONS:  Outpatient Encounter Medications as of 03/19/2023  Medication Sig Note   aspirin 81 MG tablet Take 81 mg by mouth daily. 02/22/2023: Took 4 tablets today10.20 am ( 02/22/23)   atorvastatin (LIPITOR) 20 MG tablet TAKE 1 TABLET BY MOUTH EVERY DAY (Patient taking differently: Take 20 mg by mouth daily.)    cyanocobalamin (VITAMIN B12) 1000 MCG/ML injection INJECT 1 ML INTRAMUSCULARLY EVERY OTHER WEEK (Patient taking differently: Inject 1,000 mcg into the muscle See admin instructions. INJECT 1 ML INTRAMUSCULARLY EVERY OTHER WEEK)    diclofenac Sodium (VOLTAREN) 1 % GEL Apply 2 g topically 4 (four) times daily as needed (For  pain).    ELIQUIS 5 MG TABS  tablet TAKE 1 TABLET BY MOUTH TWICE A DAY    EPINEPHrine 0.3 mg/0.3 mL IJ SOAJ injection Inject 0.3 mg into the muscle once as needed for anaphylaxis.    ketoconazole (NIZORAL) 2 % shampoo USE TWICE WEEKLY (Patient taking differently: Apply 1 Application topically 2 (two) times a week.)    metFORMIN (GLUCOPHAGE) 500 MG tablet Take 0.5 tablets (250 mg total) by mouth daily with breakfast.    metoprolol tartrate (LOPRESSOR) 25 MG tablet TAKE 1 TABLET BY MOUTH TWICE A DAY    nitroGLYCERIN (NITROSTAT) 0.4 MG SL tablet Place 1 tablet (0.4 mg total) under the tongue every 5 (five) minutes as needed for chest pain (3 doses max).    SYRINGE-NEEDLE, DISP, 3 ML (B-D 3CC LUER-LOK SYR 25GX1") 25G X 1" 3 ML MISC USE AS DIRECTED TO INJECT B12    No facility-administered encounter medications on file as of 03/19/2023.    ALLERGIES:  Allergies  Allergen Reactions   Levaquin [Levofloxacin In D5w] Swelling and Other (See Comments)    Eyes swollen   Other Anaphylaxis and Other (See Comments)    Bee sting   Ivp Dye [Iodinated Contrast Media] Other (See Comments)    Patient reports seizure   Metformin And Related Nausea Only    LABORATORY DATA:  I have reviewed the labs as listed.  CBC    Component Value Date/Time   WBC 5.4 03/12/2023 1202   RBC 3.82 (L) 03/12/2023 1202   HGB 12.4 (L) 03/12/2023 1202   HCT 37.2 (L) 03/12/2023 1202   PLT 106 (L) 03/12/2023 1202   MCV 97.4 03/12/2023 1202   MCH 32.5 03/12/2023 1202   MCHC 33.3 03/12/2023 1202   RDW 13.1 03/12/2023 1202   LYMPHSABS 1.7 03/12/2023 1202   MONOABS 0.4 03/12/2023 1202   EOSABS 0.1 03/12/2023 1202   BASOSABS 0.1 03/12/2023 1202      Latest Ref Rng & Units 03/12/2023   12:02 PM 02/22/2023   12:10 PM 01/24/2023   10:51 AM  CMP  Glucose 70 - 99 mg/dL 630  160  109   BUN 8 - 23 mg/dL 27  28  27    Creatinine 0.61 - 1.24 mg/dL 3.23  5.57  3.22   Sodium 135 - 145 mmol/L 137  138  138   Potassium 3.5 - 5.1 mmol/L 4.4  4.1  4.2   Chloride  98 - 111 mmol/L 104  105  103   CO2 22 - 32 mmol/L 26  26  27    Calcium 8.9 - 10.3 mg/dL 9.2  8.7  8.7   Total Protein 6.5 - 8.1 g/dL 7.0   6.6   Total Bilirubin 0.3 - 1.2 mg/dL 0.6   0.8   Alkaline Phos 38 - 126 U/L 56   64   AST 15 - 41 U/L 20   18   ALT 0 - 44 U/L 19   18     DIAGNOSTIC IMAGING:  I have independently reviewed the relevant imaging and discussed with the patient.   WRAP UP:  All questions were answered. The patient knows to call the clinic with any problems, questions or concerns.  Medical decision making: Moderate  Time spent on visit: I spent 20 minutes counseling the patient face to face. The total time spent in the appointment was 30 minutes and more than 50% was on counseling.  Carnella Guadalajara, PA-C  03/19/23 3:04 PM

## 2023-03-19 ENCOUNTER — Ambulatory Visit: Payer: Medicare Other | Admitting: Physician Assistant

## 2023-03-19 ENCOUNTER — Other Ambulatory Visit: Payer: Medicare Other

## 2023-03-19 ENCOUNTER — Inpatient Hospital Stay: Payer: Medicare Other | Admitting: Physician Assistant

## 2023-03-19 VITALS — BP 132/68 | HR 58 | Temp 97.4°F | Resp 16 | Wt 164.0 lb

## 2023-03-19 DIAGNOSIS — I82453 Acute embolism and thrombosis of peroneal vein, bilateral: Secondary | ICD-10-CM

## 2023-03-19 DIAGNOSIS — I82432 Acute embolism and thrombosis of left popliteal vein: Secondary | ICD-10-CM | POA: Diagnosis not present

## 2023-03-19 DIAGNOSIS — D696 Thrombocytopenia, unspecified: Secondary | ICD-10-CM | POA: Diagnosis not present

## 2023-03-19 DIAGNOSIS — D649 Anemia, unspecified: Secondary | ICD-10-CM

## 2023-03-19 DIAGNOSIS — I2510 Atherosclerotic heart disease of native coronary artery without angina pectoris: Secondary | ICD-10-CM | POA: Diagnosis not present

## 2023-03-19 DIAGNOSIS — Z85828 Personal history of other malignant neoplasm of skin: Secondary | ICD-10-CM | POA: Diagnosis not present

## 2023-03-19 NOTE — Patient Instructions (Addendum)
[facility] at [facility] **VISIT SUMMARY & IMPORTANT INSTRUCTIONS **   You were seen today by Rojelio Brenner PA-C for your blood clot and low platelets.    BLOOD CLOTS Continue to take Eliquis 5 mg twice daily. Taking Eliquis does come with increased risk of bleeding.  It is extremely important that you practice safety at home to avoid falls. If you have any major bleeding events or fall and hit your head, seek immediate medical attention. Please see attached handout for additional information regarding Eliquis and home safety. Will recheck ultrasound of your legs in 3 months.  LOW PLATELETS & ANEMIA You continue to have mildly low platelets and mild anemia. This may be evidence of a developing bone marrow disorder, but you do not need any treatment for your low platelets or anemia at this time. We will continue to monitor your platelets and anemia follow-up visits.  IRON DEFICIENCY: Your iron levels are mildly low.  I would like you to start taking iron supplement (ferrous sulfate 325 mg) daily at home.  This is available over-the-counter.  It may cause some constipation so I recommend that you also start taking daily stool softener (Colace or other similar over-the-counter stool softener), with MiraLAX as needed for severe constipation.  FOLLOW-UP APPOINTMENT: 3 months  ** Thank you for trusting me with your healthcare!  I strive to provide all of my patients with quality care at each visit.  If you receive a survey for this visit, I would be so grateful to you for taking the time to provide feedback.  Thank you in advance!  ~ Zebulun Deman                   Dr. Doreatha Massed   &   Rojelio Brenner, PA-C   - - - - - - - - - - - - - - - - - -    Thank you for choosing [facility] at [facility] to provide your oncology and hematology care.  To afford each patient quality time with our provider, please arrive at least 15 minutes before  your scheduled appointment time.   If you have a lab appointment with the Cancer Center please come in thru the Main Entrance and check in at the main information desk.  You need to re-schedule your appointment should you arrive 10 or more minutes late.  We strive to give you quality time with our providers, and arriving late affects you and other patients whose appointments are after yours.  Also, if you no show three or more times for appointments you may be dismissed from the clinic at the providers discretion.     Again, thank you for choosing [facility].  Our hope is that these requests will decrease the amount of time that you wait before being seen by our physicians.       _____________________________________________________________  Should you have questions after your visit to [facility], please contact our office at [phone] and follow the prompts.  Our office hours are 8:00 a.m. and 4:30 p.m. Monday - Friday.  Please note that voicemails left after 4:00 p.m. may not be returned until the following business day.  We are closed weekends and major holidays.  You do have access to a nurse 24-7, just call the main number to the clinic [phone] and do not press any options, hold on the line and a nurse will answer  the phone.    For prescription refill requests, have your pharmacy contact our office and allow 72 hours.

## 2023-03-21 ENCOUNTER — Other Ambulatory Visit: Payer: Self-pay | Admitting: Adult Health

## 2023-03-23 ENCOUNTER — Encounter: Payer: Self-pay | Admitting: Adult Health

## 2023-03-25 MED ORDER — APIXABAN 5 MG PO TABS: 5.0000 mg | ORAL_TABLET | Freq: Two times a day (BID) | ORAL | 0 refills | Status: DC

## 2023-05-02 ENCOUNTER — Ambulatory Visit (INDEPENDENT_AMBULATORY_CARE_PROVIDER_SITE_OTHER): Payer: Medicare Other

## 2023-05-02 ENCOUNTER — Ambulatory Visit (INDEPENDENT_AMBULATORY_CARE_PROVIDER_SITE_OTHER): Payer: Medicare Other | Admitting: Adult Health

## 2023-05-02 ENCOUNTER — Encounter: Payer: Self-pay | Admitting: Adult Health

## 2023-05-02 VITALS — BP 100/52 | HR 64 | Temp 98.1°F | Ht 69.0 in | Wt 168.0 lb

## 2023-05-02 DIAGNOSIS — M4856XA Collapsed vertebra, not elsewhere classified, lumbar region, initial encounter for fracture: Secondary | ICD-10-CM | POA: Diagnosis not present

## 2023-05-02 DIAGNOSIS — G8929 Other chronic pain: Secondary | ICD-10-CM | POA: Diagnosis not present

## 2023-05-02 DIAGNOSIS — M419 Scoliosis, unspecified: Secondary | ICD-10-CM | POA: Diagnosis not present

## 2023-05-02 DIAGNOSIS — M5126 Other intervertebral disc displacement, lumbar region: Secondary | ICD-10-CM | POA: Diagnosis not present

## 2023-05-02 DIAGNOSIS — L089 Local infection of the skin and subcutaneous tissue, unspecified: Secondary | ICD-10-CM | POA: Diagnosis not present

## 2023-05-02 DIAGNOSIS — M47816 Spondylosis without myelopathy or radiculopathy, lumbar region: Secondary | ICD-10-CM | POA: Diagnosis not present

## 2023-05-02 DIAGNOSIS — M545 Low back pain, unspecified: Secondary | ICD-10-CM

## 2023-05-02 MED ORDER — CEPHALEXIN 500 MG PO CAPS
500.0000 mg | ORAL_CAPSULE | Freq: Two times a day (BID) | ORAL | 0 refills | Status: AC
Start: 2023-05-02 — End: 2023-05-07

## 2023-05-02 NOTE — Progress Notes (Signed)
Subjective:    Patient ID: Michael Johnston, male    DOB: 06/11/1932, 89 y.o.   MRN: [id number]  HPI  89 year old male who  has a past medical history of Arthritis, CHEST PAIN, Chronic bronchitis (HCC), COLONIC POLYPS, HX OF, COPD (chronic obstructive pulmonary disease) (HCC), Coronary artery disease, Cystic kidney disease, GERD (gastroesophageal reflux disease), History of hiatal hernia, MI (myocardial infarction) (HCC) (09/2010), Mixed hyperlipidemia, Skin cancer, Sleep apnea, and Syncope and collapse (~ 2013; 05/24/2015).  He presents to the office today for an acute issue. He has noticed that over the last week and a half he has developed an infection in the stump of his right thumb. "I have been mashing it and pus has been coming out". He denies trauma or injury. He does not believe there is a foreign body in the end of the thumb. He has not had any fever or chills.   Additionally he has been having worsening low back pain. He works on his farm most days and has noticed that his back pain is starting to get worse. Pain does not radiate. No loss of bladder or bowel incontinence.  His last lumbar spine imaging was done in 2005 which showed  Mild to moderate spinal stenosis at L3-4 and L4-5.   Mild superior end plate fracture of T-12 which appears to have some bone marrow edema suggesting that this is a subacute fracture.     Review of Systems See HPI   Past Medical History:  Diagnosis Date   Arthritis    "right shoulder" (05/24/2015)   CHEST PAIN    Chronic bronchitis (HCC)    "get it ~ q yr" (05/24/2015)   COLONIC POLYPS, HX OF    COPD (chronic obstructive pulmonary disease) (HCC)    "dx'd but I don't take RX for it" (05/24/2015)   Coronary artery disease    Cystic kidney disease    GERD (gastroesophageal reflux disease)    History of hiatal hernia    MI (myocardial infarction) (HCC) 09/2010   Mixed hyperlipidemia    Skin cancer    "top of my head only" (05/24/2015)   Sleep apnea     Syncope and collapse ~ 2013; 05/24/2015    Social History   Socioeconomic History   Marital status: Married    Spouse name: Not on file   Number of children: Not on file   Years of education: Not on file   Highest education level: Not on file  Occupational History   Not on file  Tobacco Use   Smoking status: Former    Current packs/day: 0.00    Average packs/day: 1.5 packs/day for 3.0 years (4.5 ttl pk-yrs)    Types: Cigarettes    Start date: 12/08/1948    Quit date: 12/09/1951    Years since quitting: 71.4   Smokeless tobacco: Never  Vaping Use   Vaping status: Never Used  Substance and Sexual Activity   Alcohol use: Not Currently    Comment: "no alcohol since 1953"   Drug use: No   Sexual activity: Not on file  Other Topics Concern   Not on file  Social History Narrative   Retired    Is a Education officer, 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 [place]    Social Determinants of [employer] Strain: Low Risk  (10/16/2021)   Overall Financial Resource Strain (CARDIA)    Difficulty of Paying Living Expenses: Not hard at all  Food Insecurity: No Food Insecurity (  10/16/2021)   Hunger Vital Sign    Worried About Running Out of Food in the Last Year: Never true    Ran Out of Food in the Last Year: Never true  Transportation Needs: No Transportation Needs (10/16/2021)   PRAPARE - Administrator, Civil Service (Medical): No    Lack of Transportation (Non-Medical): No  Physical Activity: Insufficiently Active (10/16/2021)   Exercise Vital Sign    Days of Exercise per Week: 2 days    Minutes of Exercise per Session: 40 min  Stress: No Stress Concern Present (10/16/2021)   [employer] of Occupational Health - Occupational Stress Questionnaire    Feeling of Stress : Not at all  Social Connections: Socially Integrated (10/16/2021)   Social Connection and Isolation Panel [NHANES]    Frequency of Communication with Friends and Family: More than three times a week    Frequency of Social  Gatherings with Friends and Family: More than three times a week    Attends Religious Services: More than 4 times per year    Active Member of [employer] or Organizations: Yes    Attends Engineer, structural: More than 4 times per year    Marital Status: Married  Catering manager Violence: Not At Risk (10/16/2021)   Humiliation, Afraid, Rape, and Kick questionnaire    Fear of Current or Ex-Partner: No    Emotionally Abused: No    Physically Abused: No    Sexually Abused: No    Past Surgical History:  Procedure Laterality Date   CARDIAC CATHETERIZATION  09/2010   CHOLECYSTECTOMY OPEN  1998   COLECTOMY  1998   "partial"   CORONARY ANGIOPLASTY     CORONARY ARTERY BYPASS GRAFT  09/2010   Median sternotomy for coronary artery bypass grafting x3  (left internal mammary artery to distal left anterior  descending  coronary artery, saphenous vein graft to first diagonal branch,  saphenous vein graft to first  obtuse marginal branch, endoscopic  saphenous vein harvest from right thigh). SURGEON:  Salvatore Decent.  Cornelius Moras, MD  ASSISTANT:  Rowe Clack, PA-C  ANESTHESIA:  Guadalupe Maple, MD    LEFT HEART CATH AND CORS/GRAFTS ANGIOGRAPHY N/A 08/22/2017   Procedure: LEFT HEART CATH AND CORS/GRAFTS ANGIOGRAPHY;  Surgeon: Swazil[place], Peter M, MD;  Location: [facility] INVASIVE CV LAB;  Service: Cardiovascular;  Laterality: N/A;   SKIN CANCER EXCISION     "top of my head"    Family History  Problem Relation Age of Onset   Lung cancer Sister    Heart attack Father     Allergies  Allergen Reactions   Levaquin [Levofloxacin In D5w] Swelling and Other (See Comments)    Eyes swollen   Other Anaphylaxis and Other (See Comments)    Bee sting   Ivp Dye [Iodinated Contrast Media] Other (See Comments)    Patient reports seizure   Metformin And Related Nausea Only    Current Outpatient Medications on File Prior to Visit  Medication Sig Dispense Refill   apixaban (ELIQUIS) 5 MG TABS tablet Take 1 tablet (5 mg  total) by mouth 2 (two) times daily. 180 tablet 0   aspirin 81 MG tablet Take 81 mg by mouth daily.     atorvastatin (LIPITOR) 20 MG tablet TAKE 1 TABLET BY MOUTH EVERY DAY (Patient taking differently: Take 20 mg by mouth daily.) 90 tablet 3   cyanocobalamin (VITAMIN B12) 1000 MCG/ML injection INJECT 1 ML INTRAMUSCULARLY EVERY OTHER WEEK (Patient taking differently: Inject  1,000 mcg into the muscle See admin instructions. INJECT 1 ML INTRAMUSCULARLY EVERY OTHER WEEK) 3 mL 6   diclofenac Sodium (VOLTAREN) 1 % GEL Apply 2 g topically 4 (four) times daily as needed (For pain). 150 g 2   EPINEPHrine 0.3 mg/0.3 mL IJ SOAJ injection Inject 0.3 mg into the muscle once as needed for anaphylaxis.     Ferrous Gluconate-C-Folic Acid (IRON-C PO) Take by mouth.     ketoconazole (NIZORAL) 2 % shampoo USE TWICE WEEKLY (Patient taking differently: Apply 1 Application topically 2 (two) times a week.) 120 mL 3   metFORMIN (GLUCOPHAGE) 500 MG tablet Take 0.5 tablets (250 mg total) by mouth daily with breakfast. 45 tablet 1   metoprolol tartrate (LOPRESSOR) 25 MG tablet TAKE 1 TABLET BY MOUTH TWICE A DAY 180 tablet 3   nitroGLYCERIN (NITROSTAT) 0.4 MG SL tablet Place 1 tablet (0.4 mg total) under the tongue every 5 (five) minutes as needed for chest pain (3 doses max). 25 tablet 1   SYRINGE-NEEDLE, DISP, 3 ML (B-D 3CC LUER-LOK SYR 25GX1") 25G X 1" 3 ML MISC USE AS DIRECTED TO INJECT B12 3 each 3   No current facility-administered medications on file prior to visit.    BP (!) 100/52   Pulse 64   Temp 98.1 F (36.7 C) (Oral)   Ht 5\' 9"  (1.753 m)   Wt 168 lb (76.2 kg)   SpO2 98%   BMI 24.81 kg/m       Objective:   Physical Exam Vitals and nursing note reviewed.  Constitutional:      Appearance: Normal appearance.  Cardiovascular:     Rate and Rhythm: Normal rate and regular rhythm.     Pulses: Normal pulses.     Heart sounds: Normal heart sounds.  Pulmonary:     Effort: Pulmonary effort is normal.      Breath sounds: Normal breath sounds.  Skin:    Findings: Erythema present.     Comments: Localized erythema noted to to the distal tip of the right  thumb stump. He does have an area of infection with purulent drainage.   Neurological:     General: No focal deficit present.     Mental Status: He is alert and oriented to person, place, and time.  Psychiatric:        Mood and Affect: Mood normal.        Behavior: Behavior normal.        Thought Content: Thought content normal.        Judgment: Judgment normal.        Assessment & Plan:  1. Infection of thumb - Will treat with Kelfex  - cephALEXin (KEFLEX) 500 MG capsule; Take 1 capsule (500 mg total) by mouth 2 (two) times daily for 5 days.  Dispense: 10 capsule; Refill: 0  2. Chronic bilateral low back pain without sciatica  - DG Lumbar Spine Complete; Future - Consider MRI of lumbar spine   Shirline Frees, NP

## 2023-05-14 ENCOUNTER — Other Ambulatory Visit: Payer: Self-pay

## 2023-05-14 DIAGNOSIS — M545 Low back pain, unspecified: Secondary | ICD-10-CM

## 2023-05-26 ENCOUNTER — Encounter: Payer: Self-pay | Admitting: Physician Assistant

## 2023-05-26 ENCOUNTER — Ambulatory Visit: Payer: Medicare Other | Admitting: Physician Assistant

## 2023-05-26 DIAGNOSIS — M51369 Other intervertebral disc degeneration, lumbar region without mention of lumbar back pain or lower extremity pain: Secondary | ICD-10-CM

## 2023-05-26 DIAGNOSIS — M5136 Other intervertebral disc degeneration, lumbar region: Secondary | ICD-10-CM | POA: Diagnosis not present

## 2023-05-26 NOTE — Progress Notes (Signed)
Office Visit Note   Patient: Michael Johnston           Date of Birth: July 27, 1932           MRN: [id number] Visit Date: 05/26/2023              Requested by: Shirline Frees, NP [address] [place],  [place] [address] PCP: Shirline Frees, NP   Assessment & Plan: Visit Diagnoses:  1. Other intervertebral disc degeneration, lumbar region     Plan: Given his weakness and dropfoot on the left side along with low back pain recommend MRI.  He has failed conservative treatment which is included therapy for his back.  He is unable to take NSAIDs due to the fact that he is on chronic Eliquis.  Did recommend orthotics for his shoes given his significant leg flat feet and weakness of his posterior tibial tendons bilaterally.  Follow-Up Instructions: No follow-ups on file.   Orders:  No orders of the defined types were placed in this encounter.  No orders of the defined types were placed in this encounter.     Procedures: No procedures performed   Clinical Data: No additional findings.   Subjective: Chief Complaint  Patient presents with   Lower Back - Pain    HPI Patient 89 year old male were seen for the first time for low back pain especially when driving.  No numbness or tingling down either leg however he does note vibration sensation right knee lateral aspect.  He denies any waking pain from his back, saddle anesthesia like symptoms, weight change or bowel or bladder dysfunction.  Events have been diagnosed left foot drop but does not use the AFO because he felt that it was uncomfortable.  He is using a cane to ambulate today but most of the time uses a hoe to ambulate in his yard.  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  Radiographs lumbar spine 4 views dated 05/09/2023.  Films are personally reviewed shows degenerative changes with disc space narrowing at L3-4 L4-5.  Grade 1 retrolisthesis L3 on 4 and L4 on 5.  Endplate spurring is noted.  No acute fractures or  acute findings.  Arthrosclerosis of the aorta.  Review of Systems See HPI otherwise negative or noncontributory.  Objective: Vital Signs: There were no vitals taken for this visit.  Physical Exam Constitutional:      Appearance: He is not toxic-appearing or diaphoretic.  Cardiovascular:     Pulses: Normal pulses.  Pulmonary:     Effort: Pulmonary effort is normal.  Neurological:     Mental Status: He is alert and oriented to person, place, and time.  Psychiatric:        Mood and Affect: Mood normal.     Ortho Exam Ambulates with antalgic gait.  He is able to get on and off the exam table on his own but is quite unsteady on his feet. Lower extremity strength testing reveals 5 out of 5 strength except for inversion bilateral feet which is 3 out of 5 bilaterally.  Dorsiflexion bilateral ankles 4 out of 5 strength.  Negative straight leg raise bilaterally.  Tight hamstrings bilaterally.  Sensation grossly intact bilateral feet to light touch.  Specialty Comments:  No specialty comments available.  Imaging: No results found.   PMFS History: Patient Active Problem List   Diagnosis Date Noted   Pulmonary embolus (HCC) 02/08/2022   Combined forms of age-related cataract of left eye 11/10/2018   Combined  forms of age-related cataract of right eye 11/03/2018   Intraoperative floppy iris syndrome (IFIS) 11/03/2018   Urine stream spraying 02/23/2018   Flank pain 02/22/2018   Chest pain 08/21/2017   Unstable angina (HCC) 08/21/2017   Renal cyst 12/20/2016   B12 deficiency 11/08/2016   Type 2 diabetes mellitus with neurological complications (HCC) 11/08/2016   Syncope and collapse 05/24/2015   Benign prostatic hyperplasia with urinary obstruction 12/12/2014   Carotid artery stenosis 05/22/2012   CAD (coronary artery disease) 05/14/2012   ED (erectile dysfunction) of organic origin 08/19/2011   FH: CABG (coronary artery bypass surgery) 01/28/2011   Mixed hyperlipidemia  10/23/2010   GERD (gastroesophageal reflux disease) 10/22/2010   History of colonic polyps 06/14/2010   Past Medical History:  Diagnosis Date   Arthritis    "right shoulder" (05/24/2015)   CHEST PAIN    Chronic bronchitis (HCC)    "get it ~ q yr" (05/24/2015)   COLONIC POLYPS, HX OF    COPD (chronic obstructive pulmonary disease) (HCC)    "dx'd but I don't take RX for it" (05/24/2015)   Coronary artery disease    Cystic kidney disease    GERD (gastroesophageal reflux disease)    History of hiatal hernia    MI (myocardial infarction) (HCC) 09/2010   Mixed hyperlipidemia    Skin cancer    "top of my head only" (05/24/2015)   Sleep apnea    Syncope and collapse ~ 2013; 05/24/2015    Family History  Problem Relation Age of Onset   Lung cancer Sister    Heart attack Father     Past Surgical History:  Procedure Laterality Date   CARDIAC CATHETERIZATION  09/2010   CHOLECYSTECTOMY OPEN  1998   COLECTOMY  1998   "partial"   CORONARY ANGIOPLASTY     CORONARY ARTERY BYPASS GRAFT  09/2010   Median sternotomy for coronary artery bypass grafting x3  (left internal mammary artery to distal left anterior  descending  coronary artery, saphenous vein graft to first diagonal branch,  saphenous vein graft to first  obtuse marginal branch, endoscopic  saphenous vein harvest from right thigh). SURGEON:  Salvatore Decent.  Cornelius Moras, MD  ASSISTANT:  Rowe Clack, PA-C  ANESTHESIA:  Guadalupe Maple, MD    LEFT HEART CATH AND CORS/GRAFTS ANGIOGRAPHY N/A 08/22/2017   Procedure: LEFT HEART CATH AND CORS/GRAFTS ANGIOGRAPHY;  Surgeon: Swaziland, Peter M, MD;  Location: [facility] INVASIVE CV LAB;  Service: Cardiovascular;  Laterality: N/A;   SKIN CANCER EXCISION     "top of my head"   Social History   Occupational History   Not on file  Tobacco Use   Smoking status: Former    Current packs/day: 0.00    Average packs/day: 1.5 packs/day for 3.0 years (4.5 ttl pk-yrs)    Types: Cigarettes    Start date: 12/08/1948    Quit  date: 12/09/1951    Years since quitting: 71.5   Smokeless tobacco: Never  Vaping Use   Vaping status: Never Used  Substance and Sexual Activity   Alcohol use: Not Currently    Comment: "no alcohol since 1953"   Drug use: No   Sexual activity: Not on file

## 2023-05-27 ENCOUNTER — Other Ambulatory Visit: Payer: Self-pay

## 2023-05-27 DIAGNOSIS — M5136 Other intervertebral disc degeneration, lumbar region: Secondary | ICD-10-CM

## 2023-05-30 ENCOUNTER — Other Ambulatory Visit: Payer: Self-pay | Admitting: Adult Health

## 2023-05-30 DIAGNOSIS — I10 Essential (primary) hypertension: Secondary | ICD-10-CM

## 2023-05-30 DIAGNOSIS — I2510 Atherosclerotic heart disease of native coronary artery without angina pectoris: Secondary | ICD-10-CM

## 2023-05-30 DIAGNOSIS — E1149 Type 2 diabetes mellitus with other diabetic neurological complication: Secondary | ICD-10-CM

## 2023-05-31 ENCOUNTER — Ambulatory Visit
Admission: RE | Admit: 2023-05-31 | Discharge: 2023-05-31 | Disposition: A | Discharge status: Home / Self Care | Payer: Medicare Other | Source: Ambulatory Visit | Attending: Physician Assistant | Admitting: Physician Assistant

## 2023-05-31 DIAGNOSIS — M47816 Spondylosis without myelopathy or radiculopathy, lumbar region: Secondary | ICD-10-CM | POA: Diagnosis not present

## 2023-05-31 DIAGNOSIS — M5136 Other intervertebral disc degeneration, lumbar region: Secondary | ICD-10-CM

## 2023-05-31 DIAGNOSIS — M419 Scoliosis, unspecified: Secondary | ICD-10-CM | POA: Diagnosis not present

## 2023-05-31 DIAGNOSIS — M51369 Other intervertebral disc degeneration, lumbar region without mention of lumbar back pain or lower extremity pain: Secondary | ICD-10-CM

## 2023-06-16 ENCOUNTER — Encounter: Payer: Self-pay | Admitting: Physician Assistant

## 2023-06-16 ENCOUNTER — Ambulatory Visit: Payer: Medicare Other | Admitting: Physician Assistant

## 2023-06-16 DIAGNOSIS — M5441 Lumbago with sciatica, right side: Secondary | ICD-10-CM

## 2023-06-16 DIAGNOSIS — G8929 Other chronic pain: Secondary | ICD-10-CM | POA: Diagnosis not present

## 2023-06-16 NOTE — Progress Notes (Signed)
HPI: Mr. Kau returns today to go over the MRI of his lumbar spine.  Again he does have some low back pain especially whenever driving.  His main complaint though is right leg weakness a vibratory sensation about the right knee.  He has pain in both feet involving the toes. MRI lumbar spine is reviewed with the patient.  This shows mild scoliosis.  Chronic T12 compression fracture.  Retrolisthesis at L3-4 and L4-5.  L4-5 mild spinal stenosis and moderate to severe left lateral recess and left greater than right foraminal stenosis.  L5-S1 moderate to severe left lateral recess and left neural foraminal stenosis.  L3-4 moderate right lateral recess and right foraminal stenosis.  Impression: Lumbar stenosis   Plan: Send it for a consultation with Dr. Alvester Morin to see if he feels that he epidural steroid injection could be of any benefit.  Again patient has minimal pain.  His main complaint is the right leg being weak.  Again he does have slight foot drop on the left and is reluctant to wear his AFO.  He states he just like to be a little bit more mobile.  Questions were encouraged and answered at length.  Follow-up with [place] as needed.

## 2023-06-18 ENCOUNTER — Inpatient Hospital Stay: Payer: Medicare Other | Attending: Physician Assistant

## 2023-06-18 DIAGNOSIS — D696 Thrombocytopenia, unspecified: Secondary | ICD-10-CM | POA: Insufficient documentation

## 2023-06-18 DIAGNOSIS — I2510 Atherosclerotic heart disease of native coronary artery without angina pectoris: Secondary | ICD-10-CM | POA: Insufficient documentation

## 2023-06-18 DIAGNOSIS — Z951 Presence of aortocoronary bypass graft: Secondary | ICD-10-CM | POA: Insufficient documentation

## 2023-06-18 DIAGNOSIS — D649 Anemia, unspecified: Secondary | ICD-10-CM | POA: Insufficient documentation

## 2023-06-18 DIAGNOSIS — D472 Monoclonal gammopathy: Secondary | ICD-10-CM | POA: Insufficient documentation

## 2023-06-18 DIAGNOSIS — I82432 Acute embolism and thrombosis of left popliteal vein: Secondary | ICD-10-CM | POA: Insufficient documentation

## 2023-06-18 DIAGNOSIS — Z7901 Long term (current) use of anticoagulants: Secondary | ICD-10-CM | POA: Diagnosis not present

## 2023-06-18 DIAGNOSIS — K219 Gastro-esophageal reflux disease without esophagitis: Secondary | ICD-10-CM | POA: Diagnosis not present

## 2023-06-18 DIAGNOSIS — Q619 Cystic kidney disease, unspecified: Secondary | ICD-10-CM | POA: Diagnosis not present

## 2023-06-18 DIAGNOSIS — D72819 Decreased white blood cell count, unspecified: Secondary | ICD-10-CM | POA: Diagnosis not present

## 2023-06-18 DIAGNOSIS — I82453 Acute embolism and thrombosis of peroneal vein, bilateral: Secondary | ICD-10-CM

## 2023-06-18 LAB — CBC WITH DIFFERENTIAL/PLATELET
Abs Immature Granulocytes: 0.01 10*3/uL (ref 0.00–0.07)
Basophils Absolute: 0.1 10*3/uL (ref 0.0–0.1)
Basophils Relative: 1 %
Eosinophils Absolute: 0.1 10*3/uL (ref 0.0–0.5)
Eosinophils Relative: 3 %
HCT: 40 % (ref 39.0–52.0)
Hemoglobin: 12.6 g/dL — ABNORMAL LOW (ref 13.0–17.0)
Immature Granulocytes: 0 %
Lymphocytes Relative: 29 %
Lymphs Abs: 1.1 10*3/uL (ref 0.7–4.0)
MCH: 31.6 pg (ref 26.0–34.0)
MCHC: 31.5 g/dL (ref 30.0–36.0)
MCV: 100.3 fL — ABNORMAL HIGH (ref 80.0–100.0)
Monocytes Absolute: 0.3 10*3/uL (ref 0.1–1.0)
Monocytes Relative: 7 %
Neutro Abs: 2.3 10*3/uL (ref 1.7–7.7)
Neutrophils Relative %: 60 %
Platelets: 109 10*3/uL — ABNORMAL LOW (ref 150–400)
RBC: 3.99 MIL/uL — ABNORMAL LOW (ref 4.22–5.81)
RDW: 13.2 % (ref 11.5–15.5)
WBC: 3.9 10*3/uL — ABNORMAL LOW (ref 4.0–10.5)
nRBC: 0 % (ref 0.0–0.2)

## 2023-06-18 LAB — COMPREHENSIVE METABOLIC PANEL
ALT: 20 U/L (ref 0–44)
AST: 23 U/L (ref 15–41)
Albumin: 4.2 g/dL (ref 3.5–5.0)
Alkaline Phosphatase: 61 U/L (ref 38–126)
Anion gap: 8 (ref 5–15)
BUN: 24 mg/dL — ABNORMAL HIGH (ref 8–23)
CO2: 31 mmol/L (ref 22–32)
Calcium: 9.6 mg/dL (ref 8.9–10.3)
Chloride: 99 mmol/L (ref 98–111)
Creatinine, Ser: 1.12 mg/dL (ref 0.61–1.24)
GFR, Estimated: >60 mL/min (ref >60)
Glucose, Bld: 135 mg/dL — ABNORMAL HIGH (ref 70–99)
Potassium: 4 mmol/L (ref 3.5–5.1)
Sodium: 138 mmol/L (ref 135–145)
Total Bilirubin: 0.9 mg/dL (ref 0.3–1.2)
Total Protein: 7 g/dL (ref 6.5–8.1)

## 2023-06-18 LAB — VITAMIN B12: Vitamin B-12: 780 pg/mL (ref 180–914)

## 2023-06-18 LAB — IRON AND TIBC
Iron: 154 ug/dL (ref 45–182)
Saturation Ratios: 50 % — ABNORMAL HIGH (ref 17.9–39.5)
TIBC: 309 ug/dL (ref 250–450)
UIBC: 155 ug/dL

## 2023-06-18 LAB — LACTATE DEHYDROGENASE: LDH: 157 U/L (ref 98–192)

## 2023-06-18 LAB — RETICULOCYTES
Immature Retic Fract: 5.6 % (ref 2.3–15.9)
RBC.: 3.88 MIL/uL — ABNORMAL LOW (ref 4.22–5.81)
Retic Count, Absolute: 26.4 10*3/uL (ref 19.0–186.0)
Retic Ct Pct: 0.7 % (ref 0.4–3.1)

## 2023-06-18 LAB — FOLATE: Folate: 15.5 ng/mL (ref >5.9)

## 2023-06-18 LAB — FERRITIN: Ferritin: 75 ng/mL (ref 24–336)

## 2023-06-18 LAB — D-DIMER, QUANTITATIVE: D-Dimer, Quant: <0.27 ug{FEU}/mL (ref 0.00–0.50)

## 2023-06-19 ENCOUNTER — Inpatient Hospital Stay: Payer: Medicare Other

## 2023-06-19 ENCOUNTER — Ambulatory Visit (HOSPITAL_COMMUNITY)
Admission: RE | Admit: 2023-06-19 | Discharge: 2023-06-19 | Disposition: A | Discharge status: Home / Self Care | Payer: Medicare Other | Source: Ambulatory Visit | Attending: Physician Assistant | Admitting: Physician Assistant

## 2023-06-19 DIAGNOSIS — I82453 Acute embolism and thrombosis of peroneal vein, bilateral: Secondary | ICD-10-CM | POA: Insufficient documentation

## 2023-06-19 DIAGNOSIS — Z86718 Personal history of other venous thrombosis and embolism: Secondary | ICD-10-CM | POA: Diagnosis not present

## 2023-06-19 LAB — ANTINUCLEAR ANTIBODIES, IFA: ANA Ab, IFA: NEGATIVE

## 2023-06-19 LAB — ERYTHROPOIETIN: Erythropoietin: 9.7 m[IU]/mL (ref 2.6–18.5)

## 2023-06-19 LAB — KAPPA/LAMBDA LIGHT CHAINS
Kappa free light chain: 72.4 mg/L — ABNORMAL HIGH (ref 3.3–19.4)
Kappa, lambda light chain ratio: 4.23 — ABNORMAL HIGH (ref 0.26–1.65)
Lambda free light chains: 17.1 mg/L (ref 5.7–26.3)

## 2023-06-19 LAB — RHEUMATOID FACTOR: Rheumatoid fact SerPl-aCnc: 10 [IU]/mL (ref <14.0)

## 2023-06-22 LAB — PROTEIN ELECTROPHORESIS, SERUM
A/G Ratio: 1.7 (ref 0.7–1.7)
Albumin ELP: 4.3 g/dL (ref 2.9–4.4)
Alpha-1-Globulin: 0.2 g/dL (ref 0.0–0.4)
Alpha-2-Globulin: 0.6 g/dL (ref 0.4–1.0)
Beta Globulin: 1.2 g/dL (ref 0.7–1.3)
Gamma Globulin: 0.4 g/dL (ref 0.4–1.8)
Globulin, Total: 2.5 g/dL (ref 2.2–3.9)
M-Spike, %: 0.6 g/dL — ABNORMAL HIGH
Total Protein ELP: 6.8 g/dL (ref 6.0–8.5)

## 2023-06-23 LAB — METHYLMALONIC ACID, SERUM: Methylmalonic Acid, Quantitative: 229 nmol/L (ref 0–378)

## 2023-06-24 ENCOUNTER — Encounter: Payer: Self-pay | Admitting: Physician Assistant

## 2023-06-24 LAB — IMMUNOFIXATION ELECTROPHORESIS
IgA: 78 mg/dL (ref 61–437)
IgG (Immunoglobin G), Serum: 1223 mg/dL (ref 603–1613)
IgM (Immunoglobulin M), Srm: 72 mg/dL (ref 15–143)
Total Protein ELP: 6.7 g/dL (ref 6.0–8.5)

## 2023-06-25 ENCOUNTER — Other Ambulatory Visit: Payer: Self-pay | Admitting: *Deleted

## 2023-06-25 ENCOUNTER — Ambulatory Visit (HOSPITAL_COMMUNITY)
Admission: RE | Admit: 2023-06-25 | Discharge: 2023-06-25 | Disposition: A | Discharge status: Home / Self Care | Payer: Medicare Other | Source: Ambulatory Visit | Attending: Oncology | Admitting: Oncology

## 2023-06-25 DIAGNOSIS — D696 Thrombocytopenia, unspecified: Secondary | ICD-10-CM | POA: Diagnosis not present

## 2023-06-25 DIAGNOSIS — D472 Monoclonal gammopathy: Secondary | ICD-10-CM | POA: Diagnosis not present

## 2023-06-25 NOTE — Telephone Encounter (Signed)
I will take care of it.  Thank you so much.

## 2023-06-25 NOTE — Telephone Encounter (Signed)
Please advise 

## 2023-06-25 NOTE — Telephone Encounter (Signed)
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  He will need a skeletal survey as well as a 24-hour urine.  If there is availability to move him up sooner than we can.  When is he scheduled to be seen initially? Durenda Hurt, NP 06/25/2023 9:18 AM

## 2023-06-25 NOTE — Telephone Encounter (Signed)
Thanks so much!   Durenda Hurt, NP 06/25/2023 9:53 AM

## 2023-06-27 ENCOUNTER — Other Ambulatory Visit: Payer: Self-pay

## 2023-06-27 DIAGNOSIS — K219 Gastro-esophageal reflux disease without esophagitis: Secondary | ICD-10-CM | POA: Diagnosis not present

## 2023-06-27 DIAGNOSIS — D696 Thrombocytopenia, unspecified: Secondary | ICD-10-CM | POA: Diagnosis not present

## 2023-06-27 DIAGNOSIS — Q619 Cystic kidney disease, unspecified: Secondary | ICD-10-CM | POA: Diagnosis not present

## 2023-06-27 DIAGNOSIS — D472 Monoclonal gammopathy: Secondary | ICD-10-CM | POA: Diagnosis not present

## 2023-06-27 DIAGNOSIS — D649 Anemia, unspecified: Secondary | ICD-10-CM | POA: Diagnosis not present

## 2023-06-27 DIAGNOSIS — I2510 Atherosclerotic heart disease of native coronary artery without angina pectoris: Secondary | ICD-10-CM | POA: Diagnosis not present

## 2023-06-27 DIAGNOSIS — D72819 Decreased white blood cell count, unspecified: Secondary | ICD-10-CM | POA: Diagnosis not present

## 2023-06-27 DIAGNOSIS — I82432 Acute embolism and thrombosis of left popliteal vein: Secondary | ICD-10-CM | POA: Diagnosis not present

## 2023-06-27 DIAGNOSIS — Z951 Presence of aortocoronary bypass graft: Secondary | ICD-10-CM | POA: Diagnosis not present

## 2023-06-27 DIAGNOSIS — Z7901 Long term (current) use of anticoagulants: Secondary | ICD-10-CM | POA: Diagnosis not present

## 2023-06-30 ENCOUNTER — Encounter: Payer: Self-pay | Admitting: Adult Health

## 2023-07-02 ENCOUNTER — Inpatient Hospital Stay: Payer: Medicare Other | Admitting: Physician Assistant

## 2023-07-02 LAB — UIFE/LIGHT CHAINS/TP QN, 24-HR UR
FR KAPPA LT CH,24HR: 55.7 mg/(24.h)
FR LAMBDA LT CH,24HR: 2.71 mg/(24.h)
Free Kappa Lt Chains,Ur: 50.64 mg/L (ref 1.17–86.46)
Free Kappa/Lambda Ratio: 20.59 — ABNORMAL HIGH (ref 1.83–14.26)
Free Lambda Lt Chains,Ur: 2.46 mg/L (ref 0.27–15.21)
Total Protein, Urine-Ur/day: 67 mg/(24.h) (ref 30–150)
Total Protein, Urine: 6.1 mg/dL
Total Volume: 1100

## 2023-07-03 NOTE — Progress Notes (Signed)
Michael Johnston,   It looks like you will be seeing this patient on 07/08/2023.  Looks like we initially were seeing him for DVT on anticoagulation who you noted to have anemia despite replacing iron.  Looks like a new MGUS.  His skeletal survey still pending but this is his 24-hour urine.  Just wanted to give you a heads up.  Apparently, his daughter is very anxious about the results.   Durenda Hurt, NP 07/03/2023 8:37 AM

## 2023-07-06 NOTE — Progress Notes (Signed)
Noted.  Thank you for the heads up!  I will address with them in detial at his visit this week.

## 2023-07-08 ENCOUNTER — Inpatient Hospital Stay: Payer: Medicare Other | Admitting: Physician Assistant

## 2023-07-08 VITALS — BP 138/68 | HR 60 | Temp 98.3°F | Resp 18 | Ht 69.0 in | Wt 155.6 lb

## 2023-07-08 DIAGNOSIS — K219 Gastro-esophageal reflux disease without esophagitis: Secondary | ICD-10-CM | POA: Diagnosis not present

## 2023-07-08 DIAGNOSIS — D61818 Other pancytopenia: Secondary | ICD-10-CM

## 2023-07-08 DIAGNOSIS — I2510 Atherosclerotic heart disease of native coronary artery without angina pectoris: Secondary | ICD-10-CM | POA: Diagnosis not present

## 2023-07-08 DIAGNOSIS — D649 Anemia, unspecified: Secondary | ICD-10-CM

## 2023-07-08 DIAGNOSIS — D696 Thrombocytopenia, unspecified: Secondary | ICD-10-CM

## 2023-07-08 DIAGNOSIS — I82432 Acute embolism and thrombosis of left popliteal vein: Secondary | ICD-10-CM | POA: Diagnosis not present

## 2023-07-08 DIAGNOSIS — Q619 Cystic kidney disease, unspecified: Secondary | ICD-10-CM | POA: Diagnosis not present

## 2023-07-08 DIAGNOSIS — Z7901 Long term (current) use of anticoagulants: Secondary | ICD-10-CM

## 2023-07-08 DIAGNOSIS — D472 Monoclonal gammopathy: Secondary | ICD-10-CM | POA: Diagnosis not present

## 2023-07-08 DIAGNOSIS — I2694 Multiple subsegmental pulmonary emboli without acute cor pulmonale: Secondary | ICD-10-CM

## 2023-07-08 DIAGNOSIS — Z951 Presence of aortocoronary bypass graft: Secondary | ICD-10-CM | POA: Diagnosis not present

## 2023-07-08 DIAGNOSIS — D72819 Decreased white blood cell count, unspecified: Secondary | ICD-10-CM | POA: Diagnosis not present

## 2023-07-08 NOTE — Progress Notes (Signed)
[facility] [address], [place] [address]   CLINIC:  Medical Oncology/Hematology  PCP:  Shirline Frees, NP [address] [place] [address] [phone]   REASON FOR VISIT:  Follow-up for unprovoked bilateral DVT and bilateral PE   CURRENT THERAPY: Eliquis   INTERVAL HISTORY:   Michael Johnston 89 y.o. male returns for routine follow-up of his DVT and PE, on chronic anticoagulation with Eliquis.  He was last seen by Rojelio Brenner PA-C on 03/19/2023.  At today's visit, he reports feeling well.  He has not had any hospitalizations or major changes in his health since his last visit.  He continues to take Eliquis 5 mg twice daily, and is tolerating this well.   He denies any major bleeding events, epistaxis, hematemesis, or melena; he did note some dark stool from taking iron supplement.  He bruises easily, but denies any petechial rash.   He has some mild bilateral ankle swelling for the past few weeks, but denies any unilateral leg edema. He denies any recurrent chest pain.  No dyspnea with exertion, shortness of breath at rest, or hemoptysis.  He has fallen once in the past 6 months, which he attributes to losing his balance.  He does also have occasional vertigo.  He denies any new bone pain or recent fractures.  No new neurologic symptoms such as tinnitus, new-onset hearing loss, blurred vision, or headache.  He has some tingling in bilateral fingertips. He denies any masses or lymphadenopathy.  He has occasional night sweats associated with feeling hot/flushed, that are relieved after he removes one of his blankets.  He has not had any unexplained fevers or chills.  His weight continues to fluctuate within the same 10 pound range - weight today 155.  She reports good appetite, but is very careful about what he eats due to his diabetes.  He has 40% energy and 75% appetite.    ASSESSMENT & PLAN:  1.  Unprovoked bilateral DVT and bilateral PE - Seen   at the request of hospitalist (Dr. Blake Divine) and PCP (NP Shirline Frees) for evaluation of bilateral DVT and bilateral pulmonary embolism. -  Patient was seen by his PCP on 02/07/2021 for complaints of bilateral lower extremity edema.  D-dimer found to be significantly elevated at 11.01, therefore patient was sent to the ED where he was found to have bilateral age-indeterminate DVT with acute bilateral PE Vascular US bilateral lower extremity venous (02/08/2022): Age-indeterminate DVT of bilateral lower extremities (right posterior tibial and peroneal veins + left popliteal vein, posterior tibial vein, and left peroneal veins) CTA chest (02/08/2022): Acute subsegmental pulmonary emboli in right upper and lower lobe, small clot burden 2D echo (02/09/2022): LVEF 60 to 65%, mildly reduced RV systolic function - Initially treated with IV heparin while hospitalized, transitioned to oral Eliquis at discharge. - Patient remained on Eliquis through 01/09/2023, and opted to discontinue due to age, fall risk, and concerns regarding cost. - Patient stopped taking Eliquis as of 01/09/2023 - subsequently presented to the ED on 01/20/2023 with acute left leg swelling and pain.  LLE venous ultrasound (01/20/2023) was positive for DVT in left popliteal vein and posterior tibial vein.  He was restarted on Eliquis during ED visit on 01/20/2023. - LLE venous US from 01/20/2023 did not comment on chronicity of DVT of left popliteal vein and posterior tibial vein (and there had been no interval imaging since the first of DVTs were diagnosed in June 2023).  However, this  is suspected to be acute recurrent DVT or extension of chronic DVT based on acute symptomatology, elevated D-dimer (0.91 on 01/24/2023), temporal relationship to discontinuation of anticoagulation, and acute drop in platelets. - Venous US (06/19/2023): No evidence of DVT in bilateral lower extremities - No obvious provoking factors of his DVT/PE events. - He denies any major  bleeding events - Patient has had 1 fall within the past 6 months, denies any major injury  - Most recent D-dimer (06/18/2023): Undetectable at <0.27 - PLAN: Since patient had apparent recurrent DVT after discontinuation of Eliquis, recommend continuing indefinite anticoagulation until such time that risk of bleeding outweighs risk of clotting events. - We discussed risks and benefits of prolonged anticoagulation, including risk of life-threatening bleeding event, which is considered to be less than the risk of recurrent blood clot if he were not on anticoagulation. - We discussed patient's risk of falls and education on home safety was provided. - Patient remains eligible for Eliquis 5 mg twice daily dosing (based on age >80 years, we would consider decreasing to 2.5 mg twice daily if he had body weight <60 kg or serum creatinine >1.5 mg) - No utility for hypercoagulable studies, since this would not change management.   2.  MGUS, IgG kappa - Workup of anemia revealed MGUS in October 2024 - Initial MGUS/myeloma labs (06/18/2023): Immunofixation = IgG kappa monoclonal protein SPEP shows M spike = 0.6% Elevated kappa free light chain 72.4 with normal lambda 17.1, elevated FLC ratio 4.23 No CRAB features at this time: Hgb 12.6, creatinine 1.12, calcium 9.6 - Skeletal survey (06/25/2023): No suspicious lytic or sclerotic bone lesion identified - Urine immunofixation was Bence-Jones protein positive, kappa type with urine free light chain ratio 20.59.  Urine total protein 67 mg/24-hr - No B symptoms, new onset bone pain, or neurologic changes - Have discussed at length with patient regarding diagnosis and prognosis of MGUS, including 1% chance per year of progression to multiple myeloma - PLAN: Recheck MGUS/myeloma labs in 6 months. - Will check 24-hour urine with UPEP/UIFE in 6 months - Annual skeletal survey due October 2025  3.  Anemia, normocytic/macrocytic - Mild to moderate anemia since at  least 2012.  Baseline Hgb for the past year around 11.0-13.0. - He had normal B12, MMA, folate, copper, homocystine in January 2024. - He takes vitamin B12 injection at home every 2 weeks, prescribed by PCP. - He has been taking iron tablet daily since July 2024  - Denies any rectal bleeding, melena, or epistaxis. - Most recent labs (06/18/2023): Hgb 12.6/MCV 100.3.   Ferritin 75, iron saturation 50%.  B12 780, MMA normal, folate 15.5. Creatinine 1.12/GFR >60.  LDH normal. Reticulocytes 0.7% (reticulocyte index 0.67, hypoproliferative).  Normal erythropoietin 9.7. - DIFFERENTIAL DIAGNOSIS includes iron deficiency anemia, anemia of chronic disease.  Since patient also has having thrombocytopenia and leukopenia, there is some moderate suspicion that he may have some developing bone marrow infiltrative process such as MDS.   - PLAN: Anemia is mild and overall stable for the last 10+ years. - Continue iron tablet daily  - Repeat labs and RTC in 6 months  - If major deviations from baseline, would consider bone marrow biopsy.  Patient prefers to defer BMBx at this time.  4.  Thrombocytopenia (mild) & leukopenia - Mild intermittent thrombocytopenia since at least 2012.  Baseline platelets usually >100, but with transient worsening of thrombocytopenia during hospital stays. - CT abdomen/pelvis (09/15/2020): Liver and spleen unremarkable. - Hematology workup (09/18/2022): CMP  unremarkable. Normal copper, folate, homocystine, B12 and MMA. Immature platelet fraction 7.3 Negative ANA and rheumatoid factor. - Reports easy bruising.  No petechial rash, bleeding, or B symptoms. - Acute worsening of thrombocytopenia with platelets 65 on 01/19/2023, likely due to platelet consumption in the setting of acute DVT.  Platelets improved to 111 as of 01/24/2023. - Most recent labs (06/18/2023): Platelets 109.  WBC 3.9 with normal differential.  Normal LDH, B12, folate. - DIFFERENTIAL DIAGNOSIS immune mediated  thrombocytopenia, or bone marrow infiltrative process such as MDS - PLAN: No treatment at this time.  Continue surveillance.   - It is reasonable to continue patient on Eliquis as long as his platelets remain >50,000. - If major deviations from baseline, would consider bone marrow biopsy.  Patient prefers to defer BMBx at this time.  5.  Other history - PMH: Coronary artery disease s/p CABG, cystic kidney disease, GERD, hyperlipidemia, skin cancer, sleep apnea (does not use CPAP), and arthritis. - SOCIAL: Patient lives at home with his wife of 67 years.  He is very active.  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  He smoked when he was a teenager.  He denies any alcohol or illicit drug use. - FAMILY: Family history is negative for any blood clots or known hypercoagulable states.  Sister passed away from unspecified cancer.  PLAN SUMMARY:  >> Labs in 6 months = CBC/D, CMP, LDH, SPEP, light chains, ferritin, iron/TIBC >> 24-hour urine (UPEP/UIFE) in 6 months >> OFFICE visit in 6 months (1 week after labs)     REVIEW OF SYSTEMS:  Review of Systems  Constitutional:  Positive for fatigue. Negative for appetite change, chills, diaphoresis, fever and unexpected weight change.  HENT:   Negative for lump/mass, nosebleeds and trouble swallowing (difficulty chewing).   Eyes:  Negative for eye problems.  Respiratory:  Negative for cough, hemoptysis and shortness of breath.   Cardiovascular:  Negative for chest pain, leg swelling and palpitations.  Gastrointestinal:  Negative for abdominal pain, blood in stool, constipation, diarrhea, nausea and vomiting.  Genitourinary:  Negative for hematuria.   Musculoskeletal:  Positive for arthralgias and gait problem (loses balance).  Skin: Negative.   Neurological:  Positive for dizziness, gait problem (loses balance) and numbness (tingling in fingertips). Negative for headaches and light-headedness.  Hematological:  Does not  bruise/bleed easily.     PHYSICAL EXAM:  ECOG PERFORMANCE STATUS: 1 - Symptomatic but completely ambulatory  Vitals:   07/08/23 1310  BP: 138/68  Pulse: 60  Resp: 18  Temp: 98.3 F (36.8 C)  SpO2: 100%   Filed Weights   07/08/23 1310  Weight: 155 lb 10.3 oz (70.6 kg)   Physical Exam Constitutional:      Appearance: Normal appearance. He is normal weight.  Cardiovascular:     Heart sounds: Normal heart sounds.  Pulmonary:     Breath sounds: Normal breath sounds.  Neurological:     General: No focal deficit present.     Mental Status: Mental status is at baseline.  Psychiatric:        Behavior: Behavior normal. Behavior is cooperative.    PAST MEDICAL/SURGICAL HISTORY:  Past Medical History:  Diagnosis Date   Arthritis    "right shoulder" (05/24/2015)   CHEST PAIN    Chronic bronchitis (HCC)    "get it ~ q yr" (05/24/2015)   COLONIC POLYPS, HX OF    COPD (chronic obstructive pulmonary disease) (HCC)    "dx'd  but I don't take RX for it" (05/24/2015)   Coronary artery disease    Cystic kidney disease    GERD (gastroesophageal reflux disease)    History of hiatal hernia    MI (myocardial infarction) (HCC) 09/2010   Mixed hyperlipidemia    Skin cancer    "top of my head only" (05/24/2015)   Sleep apnea    Syncope and collapse ~ 2013; 05/24/2015   Past Surgical History:  Procedure Laterality Date   CARDIAC CATHETERIZATION  09/2010   CHOLECYSTECTOMY OPEN  1998   COLECTOMY  1998   "partial"   CORONARY ANGIOPLASTY     CORONARY ARTERY BYPASS GRAFT  09/2010   Median sternotomy for coronary artery bypass grafting x3  (left internal mammary artery to distal left anterior  descending  coronary artery, saphenous vein graft to first diagonal branch,  saphenous vein graft to first  obtuse marginal branch, endoscopic  saphenous vein harvest from right thigh). SURGEON:  Salvatore Decent.  Cornelius Moras, MD  ASSISTANT:  Rowe Clack, PA-C  ANESTHESIA:  Guadalupe Maple, MD    LEFT HEART CATH AND  CORS/GRAFTS ANGIOGRAPHY N/A 08/22/2017   Procedure: LEFT HEART CATH AND CORS/GRAFTS ANGIOGRAPHY;  Surgeon: [place]az[place], Peter M, MD;  Location: [facility] INVASIVE CV LAB;  Service: Cardiovascular;  Laterality: N/A;   SKIN CANCER EXCISION     "top of my head"    SOCIAL HISTORY:  Social History   Socioeconomic History   Marital status: Married    Spouse name: Not on file   Number of children: Not on file   Years of education: Not on file   Highest education level: Not on file  Occupational History   Not on file  Tobacco Use   Smoking status: Former    Current packs/day: 0.00    Average packs/day: 1.5 packs/day for 3.0 years (4.5 ttl pk-yrs)    Types: Cigarettes    Start date: 12/08/1948    Quit date: 12/09/1951    Years since quitting: 71.6   Smokeless tobacco: Never  Vaping Use   Vaping status: Never Used  Substance and Sexual Activity   Alcohol use: Not Currently    Comment: "no alcohol since 1953"   Drug use: No   Sexual activity: Not on file  Other Topics Concern   Not on file  Social History Narrative   Retired    Is a Education officer, 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 [place]    Social Determinants of [employer] Strain: Low Risk  (10/16/2021)   Overall Financial Resource Strain (CARDIA)    Difficulty of Paying Living Expenses: Not hard at all  Food Insecurity: No Food Insecurity (10/16/2021)   Hunger Vital Sign    Worried About Running Out of Food in the Last Year: Never true    Ran Out of Food in the Last Year: Never true  Transportation Needs: No Transportation Needs (10/16/2021)   PRAPARE - Administrator, Civil Service (Medical): No    Lack of Transportation (Non-Medical): No  Physical Activity: Insufficiently Active (10/16/2021)   Exercise Vital Sign    Days of Exercise per Week: 2 days    Minutes of Exercise per Session: 40 min  Stress: No Stress Concern Present (10/16/2021)   [employer] of Occupational Health - Occupational Stress Questionnaire    Feeling of Stress :  Not at all  Social Connections: Socially Integrated (10/16/2021)   Social Connection and Isolation Panel [NHANES]    Frequency of Communication  with Friends and Family: More than three times a week    Frequency of Social Gatherings with Friends and Family: More than three times a week    Attends Religious Services: More than 4 times per year    Active Member of [employer] or Organizations: Yes    Attends Engineer, structural: More than 4 times per year    Marital Status: Married  Catering manager Violence: Not At Risk (10/16/2021)   Humiliation, Afraid, Rape, and Kick questionnaire    Fear of Current or Ex-Partner: No    Emotionally Abused: No    Physically Abused: No    Sexually Abused: No    FAMILY HISTORY:  Family History  Problem Relation Age of Onset   Lung cancer Sister    Heart attack Father     CURRENT MEDICATIONS:  Outpatient Encounter Medications as of 07/08/2023  Medication Sig Note   aspirin 81 MG tablet Take 81 mg by mouth daily. 02/22/2023: Took 4 tablets today10.20 am ( 02/22/23)   atorvastatin (LIPITOR) 20 MG tablet TAKE 1 TABLET BY MOUTH EVERY DAY (Patient taking differently: Take 20 mg by mouth daily.)    cyanocobalamin (VITAMIN B12) 1000 MCG/ML injection INJECT 1 ML INTRAMUSCULARLY EVERY OTHER WEEK (Patient taking differently: Inject 1,000 mcg into the muscle See admin instructions. INJECT 1 ML INTRAMUSCULARLY EVERY OTHER WEEK)    diclofenac Sodium (VOLTAREN) 1 % GEL Apply 2 g topically 4 (four) times daily as needed (For pain).    EPINEPHrine 0.3 mg/0.3 mL IJ SOAJ injection Inject 0.3 mg into the muscle once as needed for anaphylaxis.    Ferrous Gluconate-C-Folic Acid (IRON-C PO) Take by mouth.    ketoconazole (NIZORAL) 2 % shampoo USE TWICE WEEKLY (Patient taking differently: Apply 1 Application topically 2 (two) times a week.)    metFORMIN (GLUCOPHAGE) 500 MG tablet TAKE 1/2 TABLET (250 MG TOTAL) BY MOUTH DAILY WITH BREAKFAST    metoprolol tartrate (LOPRESSOR)  25 MG tablet TAKE 1 TABLET BY MOUTH TWICE A DAY    nitroGLYCERIN (NITROSTAT) 0.4 MG SL tablet Place 1 tablet (0.4 mg total) under the tongue every 5 (five) minutes as needed for chest pain (3 doses max).    SYRINGE-NEEDLE, DISP, 3 ML (B-D 3CC LUER-LOK SYR 25GX1") 25G X 1" 3 ML MISC USE AS DIRECTED TO INJECT B12    apixaban (ELIQUIS) 5 MG TABS tablet Take 1 tablet (5 mg total) by mouth 2 (two) times daily.    No facility-administered encounter medications on file as of 07/08/2023.    ALLERGIES:  Allergies  Allergen Reactions   Levaquin [Levofloxacin In D5w] Swelling and Other (See Comments)    Eyes swollen   Other Anaphylaxis and Other (See Comments)    Bee sting   Ivp Dye [Iodinated Contrast Media] Other (See Comments)    Patient reports seizure   Metformin And Related Nausea Only    LABORATORY DATA:  I have reviewed the labs as listed.  CBC    Component Value Date/Time   WBC 3.9 (L) 06/18/2023 0952   RBC 3.99 (L) 06/18/2023 0952   RBC 3.88 (L) 06/18/2023 0951   HGB 12.6 (L) 06/18/2023 0952   HCT 40.0 06/18/2023 0952   PLT 109 (L) 06/18/2023 0952   MCV 100.3 (H) 06/18/2023 0952   MCH 31.6 06/18/2023 0952   MCHC 31.5 06/18/2023 0952   RDW 13.2 06/18/2023 0952   LYMPHSABS 1.1 06/18/2023 0952   MONOABS 0.3 06/18/2023 0952   EOSABS 0.1 06/18/2023 1610  BASOSABS 0.1 06/18/2023 0952      Latest Ref Rng & Units 06/18/2023    9:52 AM 03/12/2023   12:02 PM 02/22/2023   12:10 PM  CMP  Glucose 70 - 99 mg/dL 454  098  119   BUN 8 - 23 mg/dL 24  27  28    Creatinine 0.61 - 1.24 mg/dL 1.47  8.29  5.62   Sodium 135 - 145 mmol/L 138  137  138   Potassium 3.5 - 5.1 mmol/L 4.0  4.4  4.1   Chloride 98 - 111 mmol/L 99  104  105   CO2 22 - 32 mmol/L 31  26  26    Calcium 8.9 - 10.3 mg/dL 9.6  9.2  8.7   Total Protein 6.5 - 8.1 g/dL 7.0  7.0    Total Bilirubin 0.3 - 1.2 mg/dL 0.9  0.6    Alkaline Phos 38 - 126 U/L 61  56    AST 15 - 41 U/L 23  20    ALT 0 - 44 U/L 20  19       DIAGNOSTIC IMAGING:  I have independently reviewed the relevant imaging and discussed with the patient.   WRAP UP:  All questions were answered. The patient knows to call the clinic with any problems, questions or concerns.  Medical decision making: Moderate  Time spent on visit: I spent 20 minutes counseling the patient face to face. The total time spent in the appointment was 30 minutes and more than 50% was on counseling.  Carnella Guadalajara, PA-C  07/08/23 4:22 PM

## 2023-07-08 NOTE — Patient Instructions (Signed)
[facility] at [facility] **VISIT SUMMARY & IMPORTANT INSTRUCTIONS **   You were seen today by Rojelio Brenner PA-C for your blood clot and low platelets.    BLOOD CLOTS Continue to take Eliquis 5 mg twice daily. Taking Eliquis does come with increased risk of bleeding.  It is extremely important that you practice safety at home to avoid falls. If you have any major bleeding events or fall and hit your head, seek immediate medical attention. Please see attached handout for additional information regarding Eliquis and home safety. Will recheck ultrasound of your legs in 3 months.  LOW PLATELETS/WHITE BLOOD CELLS & ANEMIA You continue to have mildly low platelets and mild anemia. This may be evidence of a developing bone marrow disorder (such as possible MDS), but you do not need any treatment for your low platelets or anemia at this time. We will continue to monitor your platelets and anemia follow-up visits.  IRON DEFICIENCY: Your iron levels are mildly low, but have improved.  Continue taking iron tablet (ferrous sulfate 325 mg) 3 times each week.  If this causes any constipation, I would recommend that you also start taking daily stool softener (Colace or other similar over-the-counter stool softener), with MiraLAX as needed for severe constipation.  MGUS ("monoclonal gammopathy of undetermined significance") As we discussed, this is a "precancerous" condition where you have slightly increased plasma cells making a slightly increased amount of abnormal immunoglobulin proteins. Although MGUS is not causing any current problems in your body, it has a 1% each year risk of progression to multiple myeloma cancer. We will continue to monitor your labs every 6 months and check whole-body x-rays once a year. We will also check your urine study again in 6 months. Please bring completed urine kit to your lab appointment in 6 months. Please see the attached handout for  further information regarding MGUS.   FOLLOW-UP APPOINTMENT: 6 months  ** Thank you for trusting me with your healthcare!  I strive to provide all of my patients with quality care at each visit.  If you receive a survey for this visit, I would be so grateful to you for taking the time to provide feedback.  Thank you in advance!  ~ Keimya Briddell                   Dr. Doreatha Massed   &   Rojelio Brenner, PA-C   - - - - - - - - - - - - - - - - - -    Thank you for choosing [facility] at [facility] to provide your oncology and hematology care.  To afford each patient quality time with our provider, please arrive at least 15 minutes before your scheduled appointment time.   If you have a lab appointment with the Cancer Center please come in thru the Main Entrance and check in at the main information desk.  You need to re-schedule your appointment should you arrive 10 or more minutes late.  We strive to give you quality time with our providers, and arriving late affects you and other patients whose appointments are after yours.  Also, if you no show three or more times for appointments you may be dismissed from the clinic at the providers discretion.     Again, thank you for choosing [facility].  Our hope is that these requests will decrease the amount of time that you wait before being seen  by our physicians.       _____________________________________________________________  Should you have questions after your visit to [facility], please contact our office at [phone] and follow the prompts.  Our office hours are 8:00 a.m. and 4:30 p.m. Monday - Friday.  Please note that voicemails left after 4:00 p.m. may not be returned until the following business day.  We are closed weekends and major holidays.  You do have access to a nurse 24-7, just call the main number to the clinic [phone] and do not press any options, hold on the line and a  nurse will answer the phone.    For prescription refill requests, have your pharmacy contact our office and allow 72 hours.

## 2023-07-21 ENCOUNTER — Other Ambulatory Visit: Payer: Self-pay | Admitting: Adult Health

## 2023-07-21 DIAGNOSIS — E538 Deficiency of other specified B group vitamins: Secondary | ICD-10-CM

## 2023-07-25 ENCOUNTER — Telehealth: Payer: Self-pay | Admitting: Adult Health

## 2023-07-25 ENCOUNTER — Other Ambulatory Visit: Payer: Self-pay | Admitting: Adult Health

## 2023-07-25 DIAGNOSIS — I700 Atherosclerosis of aorta: Secondary | ICD-10-CM

## 2023-07-25 DIAGNOSIS — I2510 Atherosclerotic heart disease of native coronary artery without angina pectoris: Secondary | ICD-10-CM

## 2023-07-25 DIAGNOSIS — E1149 Type 2 diabetes mellitus with other diabetic neurological complication: Secondary | ICD-10-CM

## 2023-07-25 DIAGNOSIS — E782 Mixed hyperlipidemia: Secondary | ICD-10-CM

## 2023-07-25 NOTE — Telephone Encounter (Signed)
Rx was send in to day 07/25/2023

## 2023-07-25 NOTE — Telephone Encounter (Signed)
Prescription Request  07/25/2023  LOV: 05/02/2023  What is the name of the medication or equipment? Apixaban (Eliquis) 5 MG. Pt states he is completely out of medication.   Have you contacted your pharmacy to request a refill? No   Which pharmacy would you like this sent to?  [facility] [phone] - [place],  - [address] [place] [address] Phone: [phone] Fax: [phone]    Patient notified that their request is being sent to the clinical staff for review and that they should receive a response within 2 business days.   Please advise at Mobile [phone] (mobile)

## 2023-07-29 ENCOUNTER — Other Ambulatory Visit: Payer: Self-pay | Admitting: Adult Health

## 2023-08-05 ENCOUNTER — Ambulatory Visit: Payer: Medicare Other | Admitting: Physician Assistant

## 2023-08-26 ENCOUNTER — Encounter: Payer: Self-pay | Admitting: Adult Health

## 2023-08-26 ENCOUNTER — Ambulatory Visit (INDEPENDENT_AMBULATORY_CARE_PROVIDER_SITE_OTHER): Payer: Medicare Other | Admitting: Adult Health

## 2023-08-26 VITALS — BP 110/60 | HR 66 | Temp 97.6°F | Ht 69.0 in | Wt 169.0 lb

## 2023-08-26 DIAGNOSIS — E782 Mixed hyperlipidemia: Secondary | ICD-10-CM

## 2023-08-26 DIAGNOSIS — E538 Deficiency of other specified B group vitamins: Secondary | ICD-10-CM | POA: Diagnosis not present

## 2023-08-26 DIAGNOSIS — I2510 Atherosclerotic heart disease of native coronary artery without angina pectoris: Secondary | ICD-10-CM | POA: Diagnosis not present

## 2023-08-26 DIAGNOSIS — Z0000 Encounter for general adult medical examination without abnormal findings: Secondary | ICD-10-CM

## 2023-08-26 DIAGNOSIS — Z7984 Long term (current) use of oral hypoglycemic drugs: Secondary | ICD-10-CM

## 2023-08-26 DIAGNOSIS — I2699 Other pulmonary embolism without acute cor pulmonale: Secondary | ICD-10-CM

## 2023-08-26 DIAGNOSIS — D472 Monoclonal gammopathy: Secondary | ICD-10-CM | POA: Diagnosis not present

## 2023-08-26 DIAGNOSIS — I10 Essential (primary) hypertension: Secondary | ICD-10-CM | POA: Diagnosis not present

## 2023-08-26 DIAGNOSIS — D509 Iron deficiency anemia, unspecified: Secondary | ICD-10-CM

## 2023-08-26 DIAGNOSIS — E119 Type 2 diabetes mellitus without complications: Secondary | ICD-10-CM

## 2023-08-26 LAB — CBC
HCT: 38.2 % — ABNORMAL LOW (ref 39.0–52.0)
Hemoglobin: 12.9 g/dL — ABNORMAL LOW (ref 13.0–17.0)
MCHC: 33.7 g/dL (ref 30.0–36.0)
MCV: 97.9 fL (ref 78.0–100.0)
Platelets: 143 10*3/uL — ABNORMAL LOW (ref 150.0–400.0)
RBC: 3.91 Mil/uL — ABNORMAL LOW (ref 4.22–5.81)
RDW: 13.9 % (ref 11.5–15.5)
WBC: 5.3 10*3/uL (ref 4.0–10.5)

## 2023-08-26 LAB — HEMOGLOBIN A1C: Hgb A1c MFr Bld: 7.3 % — ABNORMAL HIGH (ref 4.6–6.5)

## 2023-08-26 LAB — LIPID PANEL
Cholesterol: 107 mg/dL (ref 0–200)
HDL: 52.5 mg/dL (ref >39.00)
LDL Cholesterol: 39 mg/dL (ref 0–99)
NonHDL: 54.59
Total CHOL/HDL Ratio: 2
Triglycerides: 80 mg/dL (ref 0.0–149.0)
VLDL: 16 mg/dL (ref 0.0–40.0)

## 2023-08-26 LAB — COMPREHENSIVE METABOLIC PANEL
ALT: 24 U/L (ref 0–53)
AST: 23 U/L (ref 0–37)
Albumin: 4.1 g/dL (ref 3.5–5.2)
Alkaline Phosphatase: 69 U/L (ref 39–117)
BUN: 23 mg/dL (ref 6–23)
CO2: 32 meq/L (ref 19–32)
Calcium: 9.1 mg/dL (ref 8.4–10.5)
Chloride: 101 meq/L (ref 96–112)
Creatinine, Ser: 0.83 mg/dL (ref 0.40–1.50)
GFR: 77.79 mL/min (ref >60.00)
Glucose, Bld: 133 mg/dL — ABNORMAL HIGH (ref 70–99)
Potassium: 4.2 meq/L (ref 3.5–5.1)
Sodium: 140 meq/L (ref 135–145)
Total Bilirubin: 0.6 mg/dL (ref 0.2–1.2)
Total Protein: 6.4 g/dL (ref 6.0–8.3)

## 2023-08-26 LAB — TSH: TSH: 3.35 u[IU]/mL (ref 0.35–5.50)

## 2023-08-26 LAB — MICROALBUMIN / CREATININE URINE RATIO
Creatinine,U: 92.1 mg/dL
Microalb Creat Ratio: 0.8 mg/g (ref 0.0–30.0)
Microalb, Ur: <0.7 mg/dL (ref 0.0–1.9)

## 2023-08-26 LAB — VITAMIN B12: Vitamin B-12: 855 pg/mL (ref 211–911)

## 2023-08-26 NOTE — Patient Instructions (Signed)
It was great seeing you today   We will follow up with you regarding your lab work   Please let me know if you need anything   

## 2023-08-26 NOTE — Progress Notes (Signed)
Subjective:    Patient ID: Michael Johnston, male    DOB: Dec 09, 1931, 89 y.o.   MRN: [id number]  HPI Patient presents for yearly preventative medicine examination. He is a pleasant 89 year old male who  has a past medical history of Arthritis, CHEST PAIN, Chronic bronchitis (HCC), COLONIC POLYPS, HX OF, COPD (chronic obstructive pulmonary disease) (HCC), Coronary artery disease, Cystic kidney disease, GERD (gastroesophageal reflux disease), History of hiatal hernia, MI (myocardial infarction) (HCC) (09/2010), Mixed hyperlipidemia, Skin cancer, Sleep apnea, and Syncope and collapse (~ 2013; 05/24/2015).  Diabetes mellitus type 2-managed with metformin 500 mg daily.  He does not check his blood sugar at home on a routine basis but denies episodes of hypoglycemia.  He denies nausea, vomiting, diarrhea. H Lab Results  Component Value Date   HGBA1C 6.3 (A) 02/27/2023   HGBA1C 6.9 (H) 08/21/2022   HGBA1C 6.8 (H) 02/07/2022   Hypertension-managed with metoprolol 25 mg twice daily.  He denies dizziness, lightheadedness, chest pain, or shortness of breath. BP Readings from Last 3 Encounters:  08/26/23 110/60  07/08/23 138/68  05/02/23 (!) 100/52   Hyperlipidemia-managed with Lipitor 20 mg daily and aspirin 81 mg.  He denies myalgia or fatigue Lab Results  Component Value Date   CHOL 135 08/21/2022   HDL 70.60 08/21/2022   LDLCALC 51 08/21/2022   TRIG 64.0 08/21/2022   CHOLHDL 2 08/21/2022    CAD status post CABG-managed by cardiology.  Prescribed Lipitor 20 mg daily and aspirin 81 mg.  He has not had any chest pain, palpitations, shortness of breath, claudication symptoms or DOE.  B12 deficiency-receives monthly B12 injections.  H/o PE -in 02/2022 -bilateral knees with subsegmental pulmonary emboli in the right upper and lower lobe with a small clot burden.  He was treated with IV heparin while hospitalized and transition to oral Eliquis at discharge.  He denies any issues with bleeding.   Currently continues on Eliquis5 twice daily.    MGUS , IgG, kappa - Work up for anemia in Oct 2024 revealed MGUS.  Initial MGUS/myeloma labs (06/18/2023): Immunofixation = IgG kappa monoclonal protein SPEP shows M spike = 0.6% Elevated kappa free light chain 72.4 with normal lambda 17.1, elevated FLC ratio 4.23 No CRAB features at this time: Hgb 12.6, creatinine 1.12, calcium 9.6 - Skeletal survey (06/25/2023): No suspicious lytic or sclerotic bone lesion identified - Urine immunofixation was Bence-Jones protein positive, kappa type with urine free light chain ratio 20.59.  Urine total protein 67 mg/24-hr  Iron Deficiency Anemia - taking oral iron supplement 2-3 times a week. Could not tolerate daily due to bowel issues.  All immunizations and health maintenance protocols were reviewed with the patient and needed orders were placed.  Appropriate screening laboratory values were ordered for the patient including screening of hyperlipidemia, renal function and hepatic function. If indicated by BPH, a PSA was ordered.  Medication reconciliation,  past medical history, social history, problem list and allergies were reviewed in detail with the patient  Goals were established with regard to weight loss, exercise, and  diet in compliance with medications  Wt Readings from Last 3 Encounters:  08/26/23 169 lb (76.7 kg)  07/08/23 155 lb 10.3 oz (70.6 kg)  05/02/23 168 lb (76.2 kg)    Review of Systems  Constitutional:  Positive for fatigue.  HENT: Negative.    Eyes: Negative.   Respiratory: Negative.    Cardiovascular: Negative.   Gastrointestinal: Negative.   Endocrine: Negative.   Genitourinary: Negative.  Musculoskeletal:  Positive for arthralgias and gait problem.  Skin: Negative.   Allergic/Immunologic: Negative.   Hematological: Negative.   Psychiatric/Behavioral: Negative.    All other systems reviewed and are negative.  Past Medical History:  Diagnosis Date   Arthritis     "right shoulder" (05/24/2015)   CHEST PAIN    Chronic bronchitis (HCC)    "get it ~ q yr" (05/24/2015)   COLONIC POLYPS, HX OF    COPD (chronic obstructive pulmonary disease) (HCC)    "dx'd but I don't take RX for it" (05/24/2015)   Coronary artery disease    Cystic kidney disease    GERD (gastroesophageal reflux disease)    History of hiatal hernia    MI (myocardial infarction) (HCC) 09/2010   Mixed hyperlipidemia    Skin cancer    "top of my head only" (05/24/2015)   Sleep apnea    Syncope and collapse ~ 2013; 05/24/2015    Social History   Socioeconomic History   Marital status: Married    Spouse name: Not on file   Number of children: Not on file   Years of education: Not on file   Highest education level: Not on file  Occupational History   Not on file  Tobacco Use   Smoking status: Former    Current packs/day: 0.00    Average packs/day: 1.5 packs/day for 3.0 years (4.5 ttl pk-yrs)    Types: Cigarettes    Start date: 12/08/1948    Quit date: 12/09/1951    Years since quitting: 71.7   Smokeless tobacco: Never  Vaping Use   Vaping status: Never Used  Substance and Sexual Activity   Alcohol use: Not Currently    Comment: "no alcohol since 1953"   Drug use: No   Sexual activity: Not on file  Other Topics Concern   Not on file  Social History Narrative   Retired    Is a Education officer, 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 [place]    Social Drivers of [employer] Strain: Low Risk  (10/16/2021)   Overall Financial Resource Strain (CARDIA)    Difficulty of Paying Living Expenses: Not hard at all  Food Insecurity: No Food Insecurity (10/16/2021)   Hunger Vital Sign    Worried About Running Out of Food in the Last Year: Never true    Ran Out of Food in the Last Year: Never true  Transportation Needs: No Transportation Needs (10/16/2021)   PRAPARE - Administrator, Civil Service (Medical): No    Lack of Transportation (Non-Medical): No  Physical Activity: Insufficiently  Active (10/16/2021)   Exercise Vital Sign    Days of Exercise per Week: 2 days    Minutes of Exercise per Session: 40 min  Stress: No Stress Concern Present (10/16/2021)   [employer] of Occupational Health - Occupational Stress Questionnaire    Feeling of Stress : Not at all  Social Connections: Socially Integrated (10/16/2021)   Social Connection and Isolation Panel [NHANES]    Frequency of Communication with Friends and Family: More than three times a week    Frequency of Social Gatherings with Friends and Family: More than three times a week    Attends Religious Services: More than 4 times per year    Active Member of [employer] or Organizations: Yes    Attends Banker Meetings: More than 4 times per year    Marital Status: Married  Catering manager Violence: Not At Risk (10/16/2021)   Humiliation,  Afraid, Rape, and Kick questionnaire    Fear of Current or Ex-Partner: No    Emotionally Abused: No    Physically Abused: No    Sexually Abused: No    Past Surgical History:  Procedure Laterality Date   CARDIAC CATHETERIZATION  09/2010   CHOLECYSTECTOMY OPEN  1998   COLECTOMY  1998   "partial"   CORONARY ANGIOPLASTY     CORONARY ARTERY BYPASS GRAFT  09/2010   Median sternotomy for coronary artery bypass grafting x3  (left internal mammary artery to distal left anterior  descending  coronary artery, saphenous vein graft to first diagonal branch,  saphenous vein graft to first  obtuse marginal branch, endoscopic  saphenous vein harvest from right thigh). SURGEON:  Salvatore Decent.  Cornelius Moras, MD  ASSISTANT:  Rowe Clack, PA-C  ANESTHESIA:  Guadalupe Maple, MD    LEFT HEART CATH AND CORS/GRAFTS ANGIOGRAPHY N/A 08/22/2017   Procedure: LEFT HEART CATH AND CORS/GRAFTS ANGIOGRAPHY;  Surgeon: [place], Peter M, MD;  Location: [facility]ASIVE CV LAB;  Service: Cardiovascular;  Laterality: N/A;   SKIN CANCER EXCISION     "top of my head"    Family History  Problem Relation Age of Onset   Lung  cancer Sister    Heart attack Father     Allergies  Allergen Reactions   Levaquin [Levofloxacin In D5w] Swelling and Other (See Comments)    Eyes swollen   Other Anaphylaxis and Other (See Comments)    Bee sting   Ivp Dye [Iodinated Contrast Media] Other (See Comments)    Patient reports seizure   Metformin And Related Nausea Only    Current Outpatient Medications on File Prior to Visit  Medication Sig Dispense Refill   aspirin 81 MG tablet Take 81 mg by mouth daily.     atorvastatin (LIPITOR) 20 MG tablet TAKE 1 TABLET BY MOUTH EVERY DAY 90 tablet 3   cyanocobalamin (VITAMIN B12) 1000 MCG/ML injection INJECT 1 ML INTRAMUSCULARLY EVERY OTHER WEEK 3 mL 6   diclofenac Sodium (VOLTAREN) 1 % GEL Apply 2 g topically 4 (four) times daily as needed (For pain). 150 g 2   ELIQUIS 5 MG TABS tablet TAKE 1 TABLET BY MOUTH TWICE A DAY 180 tablet 0   EPINEPHrine 0.3 mg/0.3 mL IJ SOAJ injection Inject 0.3 mg into the muscle once as needed for anaphylaxis.     Ferrous Gluconate-C-Folic Acid (IRON-C PO) Take by mouth.     ketoconazole (NIZORAL) 2 % shampoo USE TWICE WEEKLY 120 mL 3   metFORMIN (GLUCOPHAGE) 500 MG tablet TAKE 1 TABLET BY MOUTH EVERY DAY WITH BREAKFAST 90 tablet 1   metoprolol tartrate (LOPRESSOR) 25 MG tablet TAKE 1 TABLET BY MOUTH TWICE A DAY 180 tablet 3   nitroGLYCERIN (NITROSTAT) 0.4 MG SL tablet Place 1 tablet (0.4 mg total) under the tongue every 5 (five) minutes as needed for chest pain (3 doses max). 25 tablet 1   SYRINGE-NEEDLE, DISP, 3 ML (B-D 3CC LUER-LOK SYR 25GX1") 25G X 1" 3 ML MISC USE AS DIRECTED TO INJECT B12 3 each 3   No current facility-administered medications on file prior to visit.    BP 110/60   Pulse 66   Temp 97.6 F (36.4 C) (Oral)   Ht 5\' 9"  (1.753 m)   Wt 169 lb (76.7 kg)   SpO2 98%   BMI 24.96 kg/m       Objective:   Physical Exam Vitals and nursing note reviewed.  Constitutional:  General: He is not in acute distress.    Appearance:  Normal appearance. He is not ill-appearing.  HENT:     Head: Normocephalic and atraumatic.     Right Ear: Tympanic membrane, ear canal and external ear normal. There is no impacted cerumen.     Left Ear: Tympanic membrane, ear canal and external ear normal. There is no impacted cerumen.     Nose: Nose normal. No congestion or rhinorrhea.     Mouth/Throat:     Mouth: Mucous membranes are moist.     Pharynx: Oropharynx is clear.  Eyes:     Extraocular Movements: Extraocular movements intact.     Conjunctiva/sclera: Conjunctivae normal.     Pupils: Pupils are equal, round, and reactive to light.  Neck:     Vascular: No carotid bruit.  Cardiovascular:     Rate and Rhythm: Normal rate and regular rhythm.     Pulses: Normal pulses.     Heart sounds: No murmur heard.    No friction rub. No gallop.  Pulmonary:     Effort: Pulmonary effort is normal.     Breath sounds: Normal breath sounds.  Abdominal:     General: Abdomen is flat. Bowel sounds are normal. There is no distension.     Palpations: Abdomen is soft. There is no mass.     Tenderness: There is no abdominal tenderness. There is no guarding or rebound.     Hernia: No hernia is present.  Musculoskeletal:        General: Normal range of motion.     Cervical back: Normal range of motion and neck supple.  Lymphadenopathy:     Cervical: No cervical adenopathy.  Skin:    General: Skin is warm and dry.     Capillary Refill: Capillary refill takes less than 2 seconds.  Neurological:     General: No focal deficit present.     Mental Status: He is alert and oriented to person, place, and time.  Psychiatric:        Mood and Affect: Mood normal.        Behavior: Behavior normal.        Thought Content: Thought content normal.        Judgment: Judgment normal.       Assessment & Plan:  1. Routine general medical examination at a health care facility (Primary) Today patient counseled on age appropriate routine health concerns for  screening and prevention, each reviewed and up to date or declined. Immunizations reviewed and up to date or declined. Labs ordered and reviewed. Risk factors for depression reviewed and negative. Hearing function and visual acuity are intact. ADLs screened and addressed as needed. Functional ability and level of safety reviewed and appropriate. Education, counseling and referrals performed based on assessed risks today. Patient provided with a copy of personalized plan for preventive services. - Follow up in one year or sooner if needed  2. Diabetes mellitus treated with oral medication (HCC) - Consider increase in metformin  - Likely 6 months follow up.  - Lipid panel; Future - TSH; Future - CBC; Future - Comprehensive metabolic panel; Future - Hemoglobin A1c; Future - Microalbumin/Creatinine Ratio, Urine; Future  3. Essential hypertension - Well controlled. No change in medication  - Lipid panel; Future - TSH; Future - CBC; Future - Comprehensive metabolic panel; Future - Hemoglobin A1c; Future - Microalbumin/Creatinine Ratio, Urine; Future  4. Mixed hyperlipidemia - Consider increase in statin  - Lipid panel; Future - TSH;  Future - CBC; Future - Comprehensive metabolic panel; Future - Hemoglobin A1c; Future  5. Coronary artery disease involving native coronary artery of native heart without angina pectoris - Continue statin  - Follow up with Cardiology as directed  - Lipid panel; Future - TSH; Future - CBC; Future - Comprehensive metabolic panel; Future - Hemoglobin A1c; Future  6. B12 deficiency  - CBC; Future - Comprehensive metabolic panel; Future - Vitamin B12; Future  7. Acute pulmonary embolism without acute cor pulmonale, unspecified pulmonary embolism type (HCC) - Continue Eliquis 5 mg BID  - Lipid panel; Future - TSH; Future - CBC; Future - Comprehensive metabolic panel; Future - Hemoglobin A1c; Future  8. Iron deficiency anemia, unspecified iron  deficiency anemia type - Continue with iron. Encouraged to try for every other day  - Lipid panel; Future - TSH; Future - CBC; Future - Comprehensive metabolic panel; Future - Hemoglobin A1c; Future   9. MGUS (monoclonal gammopathy of unknown significance) - Follow up with Hematology/Oncology as directed   Shirline Frees, NP

## 2023-08-29 ENCOUNTER — Encounter: Payer: Self-pay | Admitting: Adult Health

## 2023-09-07 ENCOUNTER — Encounter: Payer: Self-pay | Admitting: Physician Assistant

## 2023-09-08 NOTE — Telephone Encounter (Signed)
Please advise?  He just had one in October.

## 2023-09-15 ENCOUNTER — Encounter: Payer: Self-pay | Admitting: Adult Health

## 2023-09-15 NOTE — Telephone Encounter (Signed)
**Note De-identified  [place] Obfuscation** Please advise 

## 2023-09-26 NOTE — Telephone Encounter (Signed)
Spoke to pt son and advised that DPR form cannot be accepted. I advised per previous Mychart message that this would need to be filled out in person. Pt son became a little upset and advised that he will speak to the manager upon his return with pt. I advised that we were following protocol and he stated that " no other offices have a problem with uploading a DPR form" I reiterated the policy and protocol but pt son was stil a little upset.

## 2023-10-10 ENCOUNTER — Telehealth: Payer: Medicare Other | Admitting: Family Medicine

## 2023-10-10 DIAGNOSIS — J4540 Moderate persistent asthma, uncomplicated: Secondary | ICD-10-CM

## 2023-10-10 MED ORDER — PREDNISONE 5 MG PO TABS
ORAL_TABLET | ORAL | 0 refills | Status: DC
Start: 2023-10-10 — End: 2023-11-05

## 2023-10-10 MED ORDER — BENZONATATE 100 MG PO CAPS: 100.0000 mg | ORAL_CAPSULE | Freq: Two times a day (BID) | ORAL | 0 refills | Status: DC | PRN

## 2023-10-10 MED ORDER — AZITHROMYCIN 250 MG PO TABS: ORAL_TABLET | ORAL | 0 refills | Status: AC

## 2023-10-10 NOTE — Progress Notes (Signed)
E-Visit for Cough   We are sorry that you are not feeling well.  Here is how we plan to help!  Based on your presentation I believe you most likely have A cough due to bacteria.  When patients have a fever and a productive cough with a change in color or increased sputum production, we are concerned about bacterial bronchitis.  If left untreated it can progress to pneumonia.  If your symptoms do not improve with your treatment plan it is important that you contact your provider.   I have prescribed Azithromyin 250 mg: two tablets now and then one tablet daily for 4 additonal days    In addition you may use A prescription cough medication called Tessalon Perles 100mg . You may take 1-2 capsules every 8 hours as needed for your cough.  Prednisone 5 mg daily for 6 days (see taper instructions below)  From your responses in the eVisit questionnaire you describe inflammation in the upper respiratory tract which is causing a significant cough.  This is commonly called Bronchitis and has four common causes:   Allergies Viral Infections Acid Reflux Bacterial Infection Allergies, viruses and acid reflux are treated by controlling symptoms or eliminating the cause. An example might be a cough caused by taking certain blood pressure medications. You stop the cough by changing the medication. Another example might be a cough caused by acid reflux. Controlling the reflux helps control the cough.  USE OF BRONCHODILATOR ("RESCUE") INHALERS: There is a risk from using your bronchodilator too frequently.  The risk is that over-reliance on a medication which only relaxes the muscles surrounding the breathing tubes can reduce the effectiveness of medications prescribed to reduce swelling and congestion of the tubes themselves.  Although you feel brief relief from the bronchodilator inhaler, your asthma may actually be worsening with the tubes becoming more swollen and filled with mucus.  This can delay other crucial  treatments, such as oral steroid medications. If you need to use a bronchodilator inhaler daily, several times per day, you should discuss this with your provider.  There are probably better treatments that could be used to keep your asthma under control.     HOME CARE Only take medications as instructed by your medical team. Complete the entire course of an antibiotic. Drink plenty of fluids and get plenty of rest. Avoid close contacts especially the very young and the elderly Cover your mouth if you cough or cough into your sleeve. Always remember to wash your hands A steam or ultrasonic humidifier can help congestion.   GET HELP RIGHT AWAY IF: You develop worsening fever. You become short of breath You cough up blood. Your symptoms persist after you have completed your treatment plan MAKE SURE YOU  Understand these instructions. Will watch your condition. Will get help right away if you are not doing well or get worse.    Thank you for choosing an e-visit.  Your e-visit answers were reviewed by a board certified advanced clinical practitioner to complete your personal care plan. Depending upon the condition, your plan could have included both over the counter or prescription medications.  Please review your pharmacy choice. Make sure the pharmacy is open so you can pick up prescription now. If there is a problem, you may contact your provider through [employer] and have the prescription routed to another pharmacy.  Your safety is important to [place]. If you have drug allergies check your prescription carefully.   For the next 24 hours you  can use MyChart to ask questions about today's visit, request a non-urgent call back, or ask for a work or school excuse. You will get an email in the next two days asking about your experience. I hope that your e-visit has been valuable and will speed your recovery.    have provided 5 minutes of non face to face time during this encounter for  chart review and documentation.

## 2023-10-18 ENCOUNTER — Other Ambulatory Visit: Payer: Self-pay | Admitting: Adult Health

## 2023-10-22 ENCOUNTER — Other Ambulatory Visit: Payer: Self-pay | Admitting: Adult Health

## 2023-10-22 NOTE — Telephone Encounter (Signed)
Copied from CRM [id number]. Topic: Clinical - Medication Refill >> Oct 22, 2023  2:37 PM Alcus Dad wrote: Most Recent Primary Care Visit:  Provider: Shirline Frees  Department: [facility]  Visit Type: PHYSICAL  Date: 08/26/2023  Medication: ELIQUIS 5 MG TABS tablet  Has the patient contacted their pharmacy? Yes (Agent: If no, request that the patient contact the pharmacy for the refill. If patient does not wish to contact the pharmacy document the reason why and proceed with request.) (Agent: If yes, when and what did the pharmacy advise?)  Is this the correct pharmacy for this prescription? Yes If no, delete pharmacy and type the correct one.  This is the patient's preferred pharmacy:  [id number] - [place], [place] - [address] [place] [address] Phone: [phone] Fax: [phone]   Has the prescription been filled recently? No  Is the patient out of the medication? No  Has the patient been seen for an appointment in the last year OR does the patient have an upcoming appointment? Yes  Can we respond through MyChart? No  Agent: Please be advised that Rx refills may take up to 3 business days. We ask that you follow-up with your pharmacy.

## 2023-10-28 NOTE — Progress Notes (Signed)
 Patient ID: Michael Johnston, male   DOB: [birth year], 89 y.o.   MRN: [id number]    [name] is seen for F/U CAD SEMI after getting injections at the dentist office September 27, 2010. Subsequently found to have 3VD needing CABG: LIMA to LAD, SVG to D1, SVG to OM  RCA was not grafted    Admitted 08/21/17 for chest pain  R/O CXR NAD no acute ECG changes Cath done by Dr Swazil[place] 08/22/17 patent Grafts normal EF and EDP RCA with no obstructive Disease   Carotid 12/28/15 plaque no stenosis due for f/u Last TTE 05/25/15 EF 55-60% no valve disease   Seen in ER 09/07/19 with chest pain. Woke him up at 4;30 am 7/10 took 2 nitro and ASA pain lasted 20 minutes had more GERD and gas as well In ER no acute ECG changes and negative troponin x 2 Started on Prilosec by primary 09/17/19   Myovue 11/01/19 normal non ischemic EF 60% continue on medical Rx  He is an avid reader of the Bible Does not believe in COVID vaccine and won't get it  Has left foot drop needs bracing and has been falling more Had PT And is doing a bit better Pain in right wrist/hand from arthritis using Voltaren gel   Has a son and daughter that live near him and look in on him Still driving as is his wife Married 69 years Met at church revival She was 80 and he was 80 He is a retired Civil Service fast streamer ketoconazole for foot fungus. 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  All other systems reviewed and negative  General: BP 120/82   Pulse (!) 58   Ht 5\' 9"  (1.753 m)   Wt 170  lb 12.8 oz (77.5 kg)   SpO2 97%   BMI 25.22 kg/m  Affect appropriate Healthy:  appears stated age HEENT: normal Neck supple with no adenopathy JVP normal no bruits no thyromegaly Lungs clear with no wheezing and good diaphragmatic motion Heart:  S1/S2 no murmur, no rub, gallop or click PMI normal post sternotomy  Abdomen: benighn, BS positve, no tenderness, no AAA no bruit.  No HSM or HJR Distal pulses intact with no bruits No edema Neuro non-focal Skin warm and dry Left sided foot drop     Current Outpatient Medications  Medication Sig Dispense Refill   aspirin 81 MG tablet Take 81 mg by mouth daily.     atorvastatin (LIPITOR) 20 MG tablet TAKE 1 TABLET BY MOUTH EVERY DAY 90 tablet 3   cyanocobalamin (VITAMIN B12) 1000 MCG/ML injection INJECT 1 ML INTRAMUSCULARLY EVERY OTHER WEEK 3 mL 6   diclofenac Sodium (VOLTAREN) 1 % GEL Apply 2 g topically 4 (four) times daily as needed (For pain). 150 g 2   ELIQUIS 5 MG TABS tablet TAKE 1 TABLET BY MOUTH TWICE A DAY 180 tablet 0   EPINEPHrine 0.3 mg/0.3 mL IJ SOAJ injection Inject 0.3 mg into the muscle once as needed for anaphylaxis.     Ferrous Gluconate-C-Folic  Acid (IRON-C PO) Take by mouth.     ketoconazole (NIZORAL) 2 % shampoo USE TWICE WEEKLY 120 mL 3   metFORMIN (GLUCOPHAGE) 500 MG tablet TAKE 1 TABLET BY MOUTH EVERY DAY WITH BREAKFAST 90 tablet 1   metoprolol tartrate (LOPRESSOR) 25 MG tablet TAKE 1 TABLET BY MOUTH TWICE A DAY 180 tablet 3   nitroGLYCERIN (NITROSTAT) 0.4 MG SL tablet Place 1 tablet (0.4 mg total) under the tongue every 5 (five) minutes as needed for chest pain (3 doses max). 25 tablet 1   SYRINGE-NEEDLE, DISP, 3 ML (B-D 3CC LUER-LOK SYR 25GX1") 25G X 1" 3 ML MISC USE AS DIRECTED TO INJECT B12 3 each 3   No current facility-administered medications for this visit.    Allergies  Levaquin [levofloxacin in d5w], Other, Ivp dye [iodinated contrast media], and Metformin and related  Electrocardiogram:   11/05/2023   SR rate 55 LAD RBBB   Assessment and Plan  CAD:  CABG 2012  Cath 08/22/17 patent grafts  Normal EF  Non ischemic myovue 11/01/19 EF 60% continue medical Rx   HTN:  Well controlled.  Continue current medications and low sodium Dash type diet.    Cholesterol is at goal. 54 07/14/20 continue statin    DM:  Discussed low carb diet.  Target hemoglobin A1c is 6.5 or less.  Continue current medications.  Carotid:  Duplex 12/28/15 plaque no stenosis given age observe given age no need to repeat   GERD:  Improved with prilosec f/u primary Low carb diet   Drug Interaction:  Ketoconazole using low dose shampoo not pills    Bifasicular Block:  yearly ECG no high grade AV block   DVT/PE:  June 2023 has been on DOAC for close to a year Anticoagulation d/c but had recurrent DVT and back on eliquis DVT resolved on duplex 06/19/23          F/U with me in a year   Charlton Haws

## 2023-11-05 ENCOUNTER — Encounter: Payer: Self-pay | Admitting: Cardiovascular Disease

## 2023-11-05 ENCOUNTER — Ambulatory Visit: Payer: Medicare Other | Attending: Cardiovascular Disease | Admitting: Cardiovascular Disease

## 2023-11-05 VITALS — BP 120/82 | HR 58 | Ht 69.0 in | Wt 170.8 lb

## 2023-11-05 DIAGNOSIS — I10 Essential (primary) hypertension: Secondary | ICD-10-CM | POA: Diagnosis not present

## 2023-11-05 DIAGNOSIS — I82402 Acute embolism and thrombosis of unspecified deep veins of left lower extremity: Secondary | ICD-10-CM

## 2023-11-05 DIAGNOSIS — Z951 Presence of aortocoronary bypass graft: Secondary | ICD-10-CM

## 2023-11-05 DIAGNOSIS — E782 Mixed hyperlipidemia: Secondary | ICD-10-CM

## 2023-11-05 NOTE — Patient Instructions (Signed)

## 2023-11-26 ENCOUNTER — Ambulatory Visit: Payer: Medicare Other | Admitting: Adult Health

## 2023-11-26 ENCOUNTER — Encounter: Payer: Self-pay | Admitting: Adult Health

## 2023-11-26 VITALS — BP 138/80 | HR 61 | Temp 97.6°F | Ht 69.0 in | Wt 171.0 lb

## 2023-11-26 DIAGNOSIS — E119 Type 2 diabetes mellitus without complications: Secondary | ICD-10-CM

## 2023-11-26 DIAGNOSIS — I10 Essential (primary) hypertension: Secondary | ICD-10-CM

## 2023-11-26 DIAGNOSIS — H8113 Benign paroxysmal vertigo, bilateral: Secondary | ICD-10-CM | POA: Diagnosis not present

## 2023-11-26 DIAGNOSIS — Z7984 Long term (current) use of oral hypoglycemic drugs: Secondary | ICD-10-CM

## 2023-11-26 LAB — POCT GLYCOSYLATED HEMOGLOBIN (HGB A1C): Hemoglobin A1C: 6.7 % — AB (ref 4.0–5.6)

## 2023-11-26 NOTE — Patient Instructions (Addendum)
 Your A1c was 6.7   Continue with current dose of metformin   I have referred you to PT for the dizziness  Lets follow up in 6 months

## 2023-11-26 NOTE — Progress Notes (Signed)
 Subjective:    Patient ID: Michael Johnston, male    DOB: 04-Jun-1932, 89 y.o.   MRN: [id number]  HPI 89 year old male who  has a past medical history of Arthritis, CHEST PAIN, Chronic bronchitis (HCC), COLONIC POLYPS, HX OF, COPD (chronic obstructive pulmonary disease) (HCC), Coronary artery disease, Cystic kidney disease, GERD (gastroesophageal reflux disease), History of hiatal hernia, MI (myocardial infarction) (HCC) (09/2010), Mixed hyperlipidemia, Skin cancer, Sleep apnea, and Syncope and collapse (~ 2013; 05/24/2015).  He presents to the office today for follow up regarding DM and HTN   Diabetes mellitus type 2-managed with metformin 500 mg daily.  He does not check his blood sugar at home on a routine basis but denies episodes of hypoglycemia.  He denies nausea, vomiting, diarrhea. H Lab Results  Component Value Date   HGBA1C 6.7 (A) 11/26/2023   HGBA1C 7.3 (H) 08/26/2023   HGBA1C 6.3 (A) 02/27/2023   Hypertension-managed with metoprolol 25 mg twice daily.  He denies dizziness, lightheadedness, chest pain, or shortness of breath. BP Readings from Last 3 Encounters:  11/26/23 138/80  11/05/23 120/82  08/26/23 110/60   Additionally, he reports that for quite some time he has been experiencing dizziness.  Dizziness is more pronounced with changing positions.  Reports that it does not last very long within a couple of steps the dizziness has nearly dissipated.  He has not fallen.  He does report that dizziness has made him vomit in the past.  He does not feel like his blood sugars are dropping too low.  Review of Systems See HPI   Past Medical History:  Diagnosis Date   Arthritis    "right shoulder" (05/24/2015)   CHEST PAIN    Chronic bronchitis (HCC)    "get it ~ q yr" (05/24/2015)   COLONIC POLYPS, HX OF    COPD (chronic obstructive pulmonary disease) (HCC)    "dx'd but I don't take RX for it" (05/24/2015)   Coronary artery disease    Cystic kidney disease    GERD  (gastroesophageal reflux disease)    History of hiatal hernia    MI (myocardial infarction) (HCC) 09/2010   Mixed hyperlipidemia    Skin cancer    "top of my head only" (05/24/2015)   Sleep apnea    Syncope and collapse ~ 2013; 05/24/2015    Social History   Socioeconomic History   Marital status: Married    Spouse name: Not on file   Number of children: Not on file   Years of education: Not on file   Highest education level: Not on file  Occupational History   Not on file  Tobacco Use   Smoking status: Former    Current packs/day: 0.00    Average packs/day: 1.5 packs/day for 3.0 years (4.5 ttl pk-yrs)    Types: Cigarettes    Start date: 12/08/1948    Quit date: 12/09/1951    Years since quitting: 72.0   Smokeless tobacco: Never  Vaping Use   Vaping status: Never Used  Substance and Sexual Activity   Alcohol use: Not Currently    Comment: "no alcohol since 1953"   Drug use: No   Sexual activity: Not on file  Other Topics Concern   Not on file  Social History Narrative   Retired    Is a Education officer, 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 [place]    Social Drivers of [employer]: Low Risk  (10/16/2021)   Overall Physicist, medical Strain (  CARDIA)    Difficulty of Paying Living Expenses: Not hard at all  Food Insecurity: No Food Insecurity (10/16/2021)   Hunger Vital Sign    Worried About Running Out of Food in the Last Year: Never true    Ran Out of Food in the Last Year: Never true  Transportation Needs: No Transportation Needs (10/16/2021)   PRAPARE - Administrator, Civil Service (Medical): No    Lack of Transportation (Non-Medical): No  Physical Activity: Insufficiently Active (10/16/2021)   Exercise Vital Sign    Days of Exercise per Week: 2 days    Minutes of Exercise per Session: 40 min  Stress: No Stress Concern Present (10/16/2021)   [employer] of Occupational Health - Occupational Stress Questionnaire    Feeling of Stress : Not at all  Social  Connections: Socially Integrated (10/16/2021)   Social Connection and Isolation Panel [NHANES]    Frequency of Communication with Friends and Family: More than three times a week    Frequency of Social Gatherings with Friends and Family: More than three times a week    Attends Religious Services: More than 4 times per year    Active Member of [employer] or Organizations: Yes    Attends Engineer, structural: More than 4 times per year    Marital Status: Married  Catering manager Violence: Not At Risk (10/16/2021)   Humiliation, Afraid, Rape, and Kick questionnaire    Fear of Current or Ex-Partner: No    Emotionally Abused: No    Physically Abused: No    Sexually Abused: No    Past Surgical History:  Procedure Laterality Date   CARDIAC CATHETERIZATION  09/2010   CHOLECYSTECTOMY OPEN  1998   COLECTOMY  1998   "partial"   CORONARY ANGIOPLASTY     CORONARY ARTERY BYPASS GRAFT  09/2010   Median sternotomy for coronary artery bypass grafting x3  (left internal mammary artery to distal left anterior  descending  coronary artery, saphenous vein graft to first diagonal branch,  saphenous vein graft to first  obtuse marginal branch, endoscopic  saphenous vein harvest from right thigh). SURGEON:  Salvatore Decent.  Cornelius Moras, MD  ASSISTANT:  Rowe Clack, PA-C  ANESTHESIA:  Guadalupe Maple, MD    LEFT HEART CATH AND CORS/GRAFTS ANGIOGRAPHY N/A 08/22/2017   Procedure: LEFT HEART CATH AND CORS/GRAFTS ANGIOGRAPHY;  Surgeon: Swaziland, Peter M, MD;  Location: [facility] INVASIVE CV LAB;  Service: Cardiovascular;  Laterality: N/A;   SKIN CANCER EXCISION     "top of my head"    Family History  Problem Relation Age of Onset   Lung cancer Sister    Heart attack Father     Allergies  Allergen Reactions   Levaquin [Levofloxacin In D5w] Swelling and Other (See Comments)    Eyes swollen   Other Anaphylaxis and Other (See Comments)    Bee sting   Ivp Dye [Iodinated Contrast Media] Other (See Comments)    Patient reports  seizure   Metformin And Related Nausea Only    Current Outpatient Medications on File Prior to Visit  Medication Sig Dispense Refill   aspirin 81 MG tablet Take 81 mg by mouth daily.     atorvastatin (LIPITOR) 20 MG tablet TAKE 1 TABLET BY MOUTH EVERY DAY 90 tablet 3   cyanocobalamin (VITAMIN B12) 1000 MCG/ML injection INJECT 1 ML INTRAMUSCULARLY EVERY OTHER WEEK 3 mL 6   diclofenac Sodium (VOLTAREN) 1 % GEL Apply 2 g topically 4 (four)  times daily as needed (For pain). 150 g 2   ELIQUIS 5 MG TABS tablet TAKE 1 TABLET BY MOUTH TWICE A DAY 180 tablet 0   EPINEPHrine 0.3 mg/0.3 mL IJ SOAJ injection Inject 0.3 mg into the muscle once as needed for anaphylaxis.     Ferrous Gluconate-C-Folic Acid (IRON-C PO) Take by mouth.     ketoconazole (NIZORAL) 2 % shampoo USE TWICE WEEKLY 120 mL 3   metFORMIN (GLUCOPHAGE) 500 MG tablet TAKE 1 TABLET BY MOUTH EVERY DAY WITH BREAKFAST 90 tablet 1   metoprolol tartrate (LOPRESSOR) 25 MG tablet TAKE 1 TABLET BY MOUTH TWICE A DAY 180 tablet 3   nitroGLYCERIN (NITROSTAT) 0.4 MG SL tablet Place 1 tablet (0.4 mg total) under the tongue every 5 (five) minutes as needed for chest pain (3 doses max). 25 tablet 1   SYRINGE-NEEDLE, DISP, 3 ML (B-D 3CC LUER-LOK SYR 25GX1") 25G X 1" 3 ML MISC USE AS DIRECTED TO INJECT B12 3 each 3   No current facility-administered medications on file prior to visit.    BP 138/80   Pulse 61   Temp 97.6 F (36.4 C) (Oral)   Ht 5\' 9"  (1.753 m)   Wt 171 lb (77.6 kg)   SpO2 98%   BMI 25.25 kg/m       Objective:   Physical Exam Vitals and nursing note reviewed.  Constitutional:      Appearance: Normal appearance. He is obese.  Eyes:     Extraocular Movements:     Right eye: Nystagmus (horizontal) present.     Left eye: Nystagmus (horizontal) present.  Cardiovascular:     Rate and Rhythm: Normal rate and regular rhythm.     Pulses: Normal pulses.     Heart sounds: Normal heart sounds.  Pulmonary:     Effort: Pulmonary  effort is normal.     Breath sounds: Normal breath sounds.  Skin:    General: Skin is warm and dry.  Neurological:     General: No focal deficit present.     Mental Status: He is alert and oriented to person, place, and time.     Cranial Nerves: Cranial nerves 2-12 are intact.     Sensory: Sensation is intact.     Motor: Motor function is intact. No tremor.     Coordination: Coordination is intact.     Gait: Gait is intact.     Comments: Became symptomatic for about 30 seconds with changing positions from laying down to sitting up.  Psychiatric:        Mood and Affect: Mood normal.        Behavior: Behavior normal.        Thought Content: Thought content normal.        Judgment: Judgment normal.       Assessment & Plan:  1. Diabetes mellitus treated with oral medication (HCC) (Primary)  - POC HgB A1c- 6.7 - back to baseline. Continue with metformin  - Follow up in 3 months   2. Essential hypertension - Well controlled. No change in medication   3. Benign paroxysmal positional vertigo due to bilateral vestibular disorder - No other neuro deficits. Will send to PT for vestibular rehab - Ambulatory referral to Physical Therapy  Shirline Frees, NP

## 2023-11-27 DIAGNOSIS — N281 Cyst of kidney, acquired: Secondary | ICD-10-CM | POA: Diagnosis not present

## 2023-11-27 DIAGNOSIS — N138 Other obstructive and reflux uropathy: Secondary | ICD-10-CM | POA: Diagnosis not present

## 2023-12-06 ENCOUNTER — Other Ambulatory Visit: Payer: Self-pay | Admitting: Adult Health

## 2023-12-11 NOTE — Therapy (Signed)
 OUTPATIENT PHYSICAL THERAPY VESTIBULAR EVALUATION     Patient Name: Michael Johnston MRN: [id number] DOB:11-28-1931, 89 y.o., male Today's Date: 12/12/2023  END OF SESSION:  PT End of Session - 12/12/23 0851     Visit Number 1    Number of Visits 6    Date for PT Re-Evaluation 01/23/24    Authorization Type [employer] Medicare    Progress Note Due on Visit 6    PT Start Time 0850    PT Stop Time 0930    PT Time Calculation (min) 40 min    Activity Tolerance Patient tolerated treatment well    Behavior During Therapy [facility] for tasks assessed/performed             Past Medical History:  Diagnosis Date   Arthritis    "right shoulder" (05/24/2015)   CHEST PAIN    Chronic bronchitis (HCC)    "get it ~ q yr" (05/24/2015)   COLONIC POLYPS, HX OF    COPD (chronic obstructive pulmonary disease) (HCC)    "dx'd but I don't take RX for it" (05/24/2015)   Coronary artery disease    Cystic kidney disease    GERD (gastroesophageal reflux disease)    History of hiatal hernia    MI (myocardial infarction) (HCC) 09/2010   Mixed hyperlipidemia    Skin cancer    "top of my head only" (05/24/2015)   Sleep apnea    Syncope and collapse ~ 2013; 05/24/2015   Past Surgical History:  Procedure Laterality Date   CARDIAC CATHETERIZATION  09/2010   CHOLECYSTECTOMY OPEN  1998   COLECTOMY  1998   "partial"   CORONARY ANGIOPLASTY     CORONARY ARTERY BYPASS GRAFT  09/2010   Median sternotomy for coronary artery bypass grafting x3  (left internal mammary artery to distal left anterior  descending  coronary artery, saphenous vein graft to first diagonal branch,  saphenous vein graft to first  obtuse marginal branch, endoscopic  saphenous vein harvest from right thigh). SURGEON:  Salvatore Decent.  Cornelius Moras, MD  ASSISTANT:  Rowe Clack, PA-C  ANESTHESIA:  Guadalupe Maple, MD    LEFT HEART CATH AND CORS/GRAFTS ANGIOGRAPHY N/A 08/22/2017   Procedure: LEFT HEART CATH AND CORS/GRAFTS ANGIOGRAPHY;  Surgeon: [place], Peter  M, MD;  [facility] INVAS[facility] CV LAB;  Service: Cardiovascular;  Laterality: N/A;   SKIN CANCER EXCISION     "top of my head"   Patient Active Problem List   Diagnosis Date Noted   Pulmonary embolus (HCC) 02/08/2022   Combined forms of age-related cataract of left eye 11/10/2018   Combined forms of age-related cataract of right eye 11/03/2018   Intraoperative floppy iris syndrome (IFIS) 11/03/2018   Urine stream spraying 02/23/2018   Flank pain 02/22/2018   Chest pain 08/21/2017   Unstable angina (HCC) 08/21/2017   Renal cyst 12/20/2016   B12 deficiency 11/08/2016   Type 2 diabetes mellitus with neurological complications (HCC) 11/08/2016   Syncope and collapse 05/24/2015   Benign prostatic hyperplasia with urinary obstruction 12/12/2014   Carotid artery stenosis 05/22/2012   CAD (coronary artery disease) 05/14/2012   ED (erectile dysfunction) of organic origin 08/19/2011   FH: CABG (coronary artery bypass surgery) 01/28/2011   Mixed hyperlipidemia 10/23/2010   GERD (gastroesophageal reflux disease) 10/22/2010   History of colonic polyps 06/14/2010    PCP: Shirline Frees, NP REFERRING PROVIDER: Shirline Frees, NP  REFERRING DIAG:  H81.13 (ICD-10-CM) - Benign paroxysmal positional vertigo due to bilateral vestibular disorder  THERAPY DIAG:  Dizziness and giddiness  Difficulty in walking, not elsewhere classified  Impairment of balance  ONSET DATE: 5 years or so  Rationale for Evaluation and Treatment: Rehabilitation  SUBJECTIVE:   SUBJECTIVE STATEMENT: Reports some spinning on occasion over the years but now has affected his walking; he staggers and walks like a "drunk man".  Worse in the mornings.  Over a year since he has had any spinning; now just feels off balance; more off balance with looking down or looking up or moving head quickly.   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 Yes: NPRS scale: 0/10 currently Pain location: stomach Pain description: right abdominal area Aggravating factors: unknown Relieving factors: unknown  PRECAUTIONS: Fall  WEIGHT BEARING RESTRICTIONS: No  FALLS: Has patient fallen in last 6 months? Yes. Number of falls 3  LIVING ENVIRONMENT: Lives with: lives with their spouse Lives in: House/apartment Stairs:  ramped entry Has following equipment at home: Single point cane and Walker - 2 wheeled  PLOF: Independent  PATIENT GOALS: get some relief from dizziness  OBJECTIVE:  Note: Objective measures were completed at Evaluation unless otherwise noted.  DIAGNOSTIC FINDINGS:   COGNITION: Overall cognitive status: Within functional limits for tasks assessed   SENSATION: Tingling left foot  POSTURE:  rounded shoulders, forward head, and forward flexed trunk  Cervical ROM:  stifness noted with extension and right rotation especially  Active AROM (deg) eval  Flexion   Extension   Right lateral flexion   Left lateral flexion   Right rotation   Left rotation   (Blank rows = not tested)  STRENGTH: not tested  LOWER EXTREMITY MMT:   MMT Right eval Left eval  Hip flexion    Hip abduction    Hip adduction    Hip internal rotation    Hip external rotation    Knee flexion    Knee extension    Ankle dorsiflexion    Ankle plantarflexion    Ankle inversion    Ankle eversion    (Blank rows = not tested)  BED MOBILITY:  Not tested  TRANSFERS: Assistive device utilized: None  Sit to stand: Modified independence Stand to sit: Modified independence Chair to chair:  not tested Floor:  not tested    GAIT: Gait pattern: decreased ankle dorsiflexion- Right, decreased ankle dorsiflexion- Left, trunk flexed, wide BOS, and poor foot clearance- Left Distance walked: 50 ft in clinic Assistive device utilized: None Level of assistance: Modified  independence and SBA Comments: foot slap with ambulation  FUNCTIONAL TESTS:  SLS right foot 1"; left unable  PATIENT SURVEYS:  DHI 30/100  VESTIBULAR ASSESSMENT:  GENERAL OBSERVATION: patient ambulates with wide BOS and forward flexed trunk; hooded eyes   SYMPTOM BEHAVIOR:  Subjective history: see above  Non-Vestibular symptoms: changes in hearing and changes in vision  Type of dizziness: Blurred Vision, Imbalance (Disequilibrium), and Unsteady with head/body turns  Frequency: frequently throughout the day  Duration: short lasting   Aggravating factors: Induced by motion: looking up at the ceiling, turning head quickly, sitting in a moving car, and activity in general, Worse in the morning, Worse in the dark, Worse outside or in busy environment, and Moving eyes  Relieving factors: head stationary, slow movements, and avoid busy/distracting environments  Progression of symptoms: unchanged  OCULOMOTOR EXAM:  Ocular Alignment: normal  Ocular  ROM: No Limitations  Spontaneous Nystagmus: absent  Gaze-Induced Nystagmus: absent  Smooth Pursuits: saccades  Saccades: extra eye movements  Convergence/Divergence: 3 cm   VESTIBULAR - OCULAR REFLEX:   Slow VOR: Positive Bilaterally  VOR Cancellation: Normal  Head-Impulse Test: HIT Right: positive HIT Left: positive  Dynamic Visual Acuity:  not assessed   POSITIONAL TESTING: Other: not tested at eval  MOTION SENSITIVITY:  Motion Sensitivity Quotient Intensity: 0 = none, 1 = Lightheaded, 2 = Mild, 3 = Moderate, 4 = Severe, 5 = Vomiting  Intensity  1. Sitting to supine   2. Supine to L side   3. Supine to R side   4. Supine to sitting   5. L Hallpike-Dix   6. Up from L    7. R Hallpike-Dix   8. Up from R    9. Sitting, head tipped to L knee   10. Head up from L knee   11. Sitting, head tipped to R knee   12. Head up from R knee   13. Sitting head turns x5 4  14.Sitting head nods x5 3  15. In stance, 180 turn to L    16.  In stance, 180 turn to R     OTHOSTATICS: not done  FUNCTIONAL GAIT: 5 times sit to stand: next visit SLS  right foot 1" and left unable                                                                                                                             TREATMENT DATE: 12/12/23 physical therapy evaluation and HEP instruction   Canalith Repositioning:  Comment: not tested at eval Gaze Adaptation:  x1 Viewing Horizontal: Position: sitting, Time: 10 reps, Reps: 10, and Comment: 4/5 dizziness and x1 Viewing Vertical:  Position: sitting , Reps: 10, and Comment: 3/5 dizziness Habituation:  Other: not today Other: not today  PATIENT EDUCATION: Education details: Patient educated on exam findings, POC, scope of PT, HEP, and what to expect next visit. Person educated: Patient Education method: Explanation, Demonstration, and Handouts Education comprehension: verbalized understanding, returned demonstration, verbal cues required, and tactile cues required  HOME EXERCISE PROGRAM: Access Code: [email or web address] URL: [email or web address] Date: 12/12/2023 Prepared by: AP - Rehab  Exercises - Seated Gaze Stabilization with Head Nod  - 5 x daily - 7 x weekly - 60 hold - Seated Gaze Stabilization with Head Rotation  - 5 x daily - 7 x weekly - 60 hold GOALS: Goals reviewed with patient? No  SHORT TERM GOALS: Target date: 01/02/24  patient will be independent with initial HEP  Baseline: Goal status: INITIAL  2.  Patient will report 50% improvement overall  Baseline:  Goal status: INITIAL  3.  Patient will be able to stand on each leg SLS x 4" to demonstrate improved functional balance.  Baseline:  Goal status: INITIAL   LONG TERM GOALS: Target date: 01/23/2024  Patient will be independent in self management strategies to  improve quality of life and functional outcomes.  Baseline:  Goal status: INITIAL  2.  Patient will report 75% improvement overall  Baseline:   Goal status: INITIAL  3.  Patient will improve DHI score by 10 points to demonstrate decreased dizziness with functional activity Baseline: 30/100 Goal status: INITIAL  4.  Patient will remain free of falls Baseline:  Goal status: INITIAL   ASSESSMENT:  CLINICAL IMPRESSION: Patient is a 89 y.o. male who was seen today for physical therapy evaluation and treatment for Benign paroxysmal positional vertigo due to bilateral vestibular disorder. Patient demonstrates impaired VOR, balance deficits and gait abnormalities which are negatively impacting patient ability to perform ADLs and functional mobility tasks. Patient will benefit from skilled physical therapy services to address these deficits to improve level of function with ADLs, functional mobility tasks, and reduce risk for falls.   OBJECTIVE IMPAIRMENTS: Abnormal gait, decreased balance, decreased Michael of condition, dizziness, impaired perceived functional ability, and pain.   ACTIVITY LIMITATIONS: carrying, lifting, bending, standing, stairs, transfers, and locomotion level  PARTICIPATION LIMITATIONS: cleaning, driving, and yard work  PERSONAL FACTORS:  HOH and impaired vision  are also affecting patient's functional outcome.   REHAB POTENTIAL: Good  CLINICAL DECISION MAKING: Evolving/moderate complexity  EVALUATION COMPLEXITY: Moderate   PLAN:  PT FREQUENCY: 1x/week  PT DURATION: 6 weeks  PLANNED INTERVENTIONS: 97164- PT Re-evaluation, 97110-Therapeutic exercises, 97530- Therapeutic activity, 97112- Neuromuscular re-education, 97535- Self Care, 16109- Manual therapy, [phone]- Gait training, [phone]- Orthotic Fit/training, [phone]- Canalith repositioning, [id number]- Aquatic Therapy, [phone]- Splinting, Patient/Family education, Balance training, Stair training, Taping, Dry Needling, Joint mobilization, Joint manipulation, Spinal manipulation, Spinal mobilization, Scar mobilization, and DME instructions.   PLAN FOR NEXT SESSION:  Review HEP and goals; measure AROM of Cspine; noted cervical tightness, work on balance, progress habituation exercises, VOR exercises; consider positional testing  Would like to do 2 x a week but coming from [place] so will do 1 x  a week   11:02 AM, 12/12/23 Snyder Colavito Small Taleah Bellantoni MPT [facility] [place] [phone] Ph:[phone]  [facility] Medicare Auth Request Information  Date of referral: 11/26/2023 Referring provider: Shirline Frees, NP Referring diagnosis (ICD 10)? R42 Treatment diagnosis (ICD 10)? (if different than referring diagnosis) R42, R26.2, R26.89  Functional Tool Score: DHI 30/100  What was this (referring dx) caused by? Ongoing Issue  Ashby Dawes of Condition: Chronic (continuous duration > 3 months)   Laterality: Both  Current Functional Measure Score: Other DHI 30/100  Objective measurements identify impairments when they are compared to normal values, the uninvolved extremity, and prior level of function.  [x]  Yes  []  No  Objective assessment of functional ability: Moderate functional limitations   Briefly describe symptoms: dizziness and imbalance  How did symptoms start: 5 years ago  Average pain intensity:  Last 24 hours: 0  Past week: 0-5  How often does the pt experience symptoms? Frequently  How much have the symptoms interfered with usual daily activities? Quite a bit  How has condition changed since care began at this facility? No change  In general, how is the patients overall health? Good   BACK PAIN (STarT Back Screening Tool) No

## 2023-12-12 ENCOUNTER — Ambulatory Visit (HOSPITAL_COMMUNITY): Attending: Adult Health

## 2023-12-12 ENCOUNTER — Other Ambulatory Visit: Payer: Self-pay

## 2023-12-12 DIAGNOSIS — R2681 Unsteadiness on feet: Secondary | ICD-10-CM | POA: Insufficient documentation

## 2023-12-12 DIAGNOSIS — H8113 Benign paroxysmal vertigo, bilateral: Secondary | ICD-10-CM | POA: Insufficient documentation

## 2023-12-12 DIAGNOSIS — M5459 Other low back pain: Secondary | ICD-10-CM | POA: Diagnosis not present

## 2023-12-12 DIAGNOSIS — R2689 Other abnormalities of gait and mobility: Secondary | ICD-10-CM | POA: Insufficient documentation

## 2023-12-12 DIAGNOSIS — R42 Dizziness and giddiness: Secondary | ICD-10-CM | POA: Insufficient documentation

## 2023-12-12 DIAGNOSIS — R262 Difficulty in walking, not elsewhere classified: Secondary | ICD-10-CM | POA: Diagnosis not present

## 2023-12-12 DIAGNOSIS — M6281 Muscle weakness (generalized): Secondary | ICD-10-CM | POA: Diagnosis not present

## 2023-12-23 ENCOUNTER — Encounter: Payer: Self-pay | Admitting: Cardiovascular Disease

## 2023-12-23 ENCOUNTER — Other Ambulatory Visit: Payer: Self-pay | Admitting: Adult Health

## 2023-12-23 DIAGNOSIS — L218 Other seborrheic dermatitis: Secondary | ICD-10-CM | POA: Diagnosis not present

## 2023-12-23 DIAGNOSIS — L570 Actinic keratosis: Secondary | ICD-10-CM | POA: Diagnosis not present

## 2023-12-24 MED ORDER — EPINEPHRINE 0.3 MG/0.3ML IJ SOAJ: 0.3000 mg | Freq: Once | INTRAMUSCULAR | 0 refills | Status: DC | PRN

## 2023-12-24 MED ORDER — NITROGLYCERIN 0.4 MG SL SUBL: 0.4000 mg | SUBLINGUAL_TABLET | SUBLINGUAL | 1 refills | Status: AC | PRN

## 2023-12-28 ENCOUNTER — Emergency Department (HOSPITAL_BASED_OUTPATIENT_CLINIC_OR_DEPARTMENT_OTHER)

## 2023-12-28 ENCOUNTER — Encounter (HOSPITAL_BASED_OUTPATIENT_CLINIC_OR_DEPARTMENT_OTHER): Payer: Self-pay | Admitting: Emergency Medicine

## 2023-12-28 ENCOUNTER — Emergency Department (HOSPITAL_BASED_OUTPATIENT_CLINIC_OR_DEPARTMENT_OTHER)
Admission: EM | Admit: 2023-12-28 | Discharge: 2023-12-28 | Disposition: A | Discharge status: Home / Self Care | Attending: Emergency Medicine | Admitting: Emergency Medicine

## 2023-12-28 DIAGNOSIS — Z7982 Long term (current) use of aspirin: Secondary | ICD-10-CM | POA: Insufficient documentation

## 2023-12-28 DIAGNOSIS — R0789 Other chest pain: Secondary | ICD-10-CM | POA: Diagnosis not present

## 2023-12-28 DIAGNOSIS — I700 Atherosclerosis of aorta: Secondary | ICD-10-CM | POA: Insufficient documentation

## 2023-12-28 DIAGNOSIS — N281 Cyst of kidney, acquired: Secondary | ICD-10-CM | POA: Diagnosis not present

## 2023-12-28 DIAGNOSIS — R5383 Other fatigue: Secondary | ICD-10-CM | POA: Insufficient documentation

## 2023-12-28 DIAGNOSIS — R109 Unspecified abdominal pain: Secondary | ICD-10-CM | POA: Diagnosis not present

## 2023-12-28 DIAGNOSIS — M7989 Other specified soft tissue disorders: Secondary | ICD-10-CM | POA: Diagnosis not present

## 2023-12-28 DIAGNOSIS — Z86718 Personal history of other venous thrombosis and embolism: Secondary | ICD-10-CM | POA: Diagnosis not present

## 2023-12-28 DIAGNOSIS — R079 Chest pain, unspecified: Secondary | ICD-10-CM | POA: Insufficient documentation

## 2023-12-28 DIAGNOSIS — Z7901 Long term (current) use of anticoagulants: Secondary | ICD-10-CM | POA: Insufficient documentation

## 2023-12-28 DIAGNOSIS — I4510 Unspecified right bundle-branch block: Secondary | ICD-10-CM | POA: Diagnosis not present

## 2023-12-28 DIAGNOSIS — K5730 Diverticulosis of large intestine without perforation or abscess without bleeding: Secondary | ICD-10-CM | POA: Diagnosis not present

## 2023-12-28 DIAGNOSIS — R1084 Generalized abdominal pain: Secondary | ICD-10-CM | POA: Insufficient documentation

## 2023-12-28 DIAGNOSIS — Z951 Presence of aortocoronary bypass graft: Secondary | ICD-10-CM | POA: Insufficient documentation

## 2023-12-28 DIAGNOSIS — R1032 Left lower quadrant pain: Secondary | ICD-10-CM | POA: Diagnosis present

## 2023-12-28 DIAGNOSIS — I2510 Atherosclerotic heart disease of native coronary artery without angina pectoris: Secondary | ICD-10-CM | POA: Insufficient documentation

## 2023-12-28 LAB — CBC WITH DIFFERENTIAL/PLATELET
Abs Immature Granulocytes: 0.03 10*3/uL (ref 0.00–0.07)
Basophils Absolute: 0 10*3/uL (ref 0.0–0.1)
Basophils Relative: 1 %
Eosinophils Absolute: 0.1 10*3/uL (ref 0.0–0.5)
Eosinophils Relative: 3 %
HCT: 33.5 % — ABNORMAL LOW (ref 39.0–52.0)
Hemoglobin: 11.1 g/dL — ABNORMAL LOW (ref 13.0–17.0)
Immature Granulocytes: 1 %
Lymphocytes Relative: 35 %
Lymphs Abs: 1.1 10*3/uL (ref 0.7–4.0)
MCH: 32.6 pg (ref 26.0–34.0)
MCHC: 33.1 g/dL (ref 30.0–36.0)
MCV: 98.2 fL (ref 80.0–100.0)
Monocytes Absolute: 0.3 10*3/uL (ref 0.1–1.0)
Monocytes Relative: 11 %
Neutro Abs: 1.6 10*3/uL — ABNORMAL LOW (ref 1.7–7.7)
Neutrophils Relative %: 49 %
Platelets: 87 10*3/uL — ABNORMAL LOW (ref 150–400)
RBC: 3.41 MIL/uL — ABNORMAL LOW (ref 4.22–5.81)
RDW: 13.4 % (ref 11.5–15.5)
WBC: 3.2 10*3/uL — ABNORMAL LOW (ref 4.0–10.5)
nRBC: 0 % (ref 0.0–0.2)

## 2023-12-28 LAB — COMPREHENSIVE METABOLIC PANEL WITH GFR
ALT: 15 U/L (ref 0–44)
AST: 17 U/L (ref 15–41)
Albumin: 4.1 g/dL (ref 3.5–5.0)
Alkaline Phosphatase: 57 U/L (ref 38–126)
Anion gap: 7 (ref 5–15)
BUN: 32 mg/dL — ABNORMAL HIGH (ref 8–23)
CO2: 29 mmol/L (ref 22–32)
Calcium: 9.2 mg/dL (ref 8.9–10.3)
Chloride: 103 mmol/L (ref 98–111)
Creatinine, Ser: 0.9 mg/dL (ref 0.61–1.24)
GFR, Estimated: >60 mL/min (ref >60)
Glucose, Bld: 177 mg/dL — ABNORMAL HIGH (ref 70–99)
Potassium: 4.1 mmol/L (ref 3.5–5.1)
Sodium: 139 mmol/L (ref 135–145)
Total Bilirubin: 0.4 mg/dL (ref 0.0–1.2)
Total Protein: 6.4 g/dL — ABNORMAL LOW (ref 6.5–8.1)

## 2023-12-28 LAB — URINALYSIS, W/ REFLEX TO CULTURE (INFECTION SUSPECTED)
Bacteria, UA: NONE SEEN
Bilirubin Urine: NEGATIVE
Glucose, UA: NEGATIVE mg/dL
Hgb urine dipstick: NEGATIVE
Ketones, ur: NEGATIVE mg/dL
Leukocytes,Ua: NEGATIVE
Nitrite: NEGATIVE
Protein, ur: NEGATIVE mg/dL
Specific Gravity, Urine: 1.024 (ref 1.005–1.030)
pH: 6.5 (ref 5.0–8.0)

## 2023-12-28 LAB — BRAIN NATRIURETIC PEPTIDE: B Natriuretic Peptide: 111.8 pg/mL — ABNORMAL HIGH (ref 0.0–100.0)

## 2023-12-28 LAB — LACTIC ACID, PLASMA: Lactic Acid, Venous: 1 mmol/L (ref 0.5–1.9)

## 2023-12-28 LAB — TROPONIN I (HIGH SENSITIVITY)
Troponin I (High Sensitivity): 9 ng/L (ref <18)
Troponin I (High Sensitivity): 9 ng/L (ref <18)

## 2023-12-28 LAB — LIPASE, BLOOD: Lipase: 28 U/L (ref 11–51)

## 2023-12-28 MED ORDER — MORPHINE SULFATE (PF) 4 MG/ML IV SOLN
4.0000 mg | Freq: Once | INTRAVENOUS | Status: DC
  Filled 2023-12-28: qty 1

## 2023-12-28 NOTE — ED Provider Notes (Signed)
 [place] EMERGENCY DEPARTMENT AT [facility] Provider Note   CSN: [id number] Arrival date & time: 12/28/23  1635     History {Add pertinent medical, surgical, social history, OB history to HPI:1} Chief Complaint  Patient presents with   Chest Pain   Abdominal Pain    Michael Johnston is a 89 y.o. male.  He is brought in by family member for evaluation of chest and abdominal pain.  He said he did not feel well when he woke up was very fatigued.  Had some swelling in his legs.  Has had that before.  Around 4 PM he began experiencing pain in his left lower abdomen that wraps around through to his back.  It also was in his left chest.  Initially was 10 out of 10 and now rates it as 7 out of 10.  Had a normal bowel movement today and is urinating without any difficulty.  No blood in the urine.  No fevers or chills.  No cough or shortness of breath.  He is on Eliquis  for DVT PE.  The history is provided by the patient and a relative.  Chest Pain Pain location:  L chest Pain quality: aching   Associated symptoms: abdominal pain   Associated symptoms: no cough, no fever, no nausea, no shortness of breath and no vomiting   Abdominal pain:    Location:  LLQ   Quality: aching     Severity:  Severe   Onset quality:  Sudden   Duration:  1 hour   Timing:  Constant   Progression:  Improving   Chronicity:  New Abdominal Pain Pain location:  LLQ Pain radiates to:  Back Relieved by:  None tried Worsened by:  Nothing Ineffective treatments:  None tried Associated symptoms: chest pain   Associated symptoms: no cough, no diarrhea, no dysuria, no fever, no hematemesis, no hematochezia, no hematuria, no nausea, no shortness of breath and no vomiting        Home Medications Prior to Admission medications   Medication Sig Start Date End Date Taking? Authorizing Provider  aspirin  81 MG tablet Take 81 mg by mouth daily.    [provider]  atorvastatin  (LIPITOR) 20 MG  tablet TAKE 1 TABLET BY MOUTH EVERY DAY 07/25/23   Nafziger, Randel Buss, NP  cyanocobalamin  (VITAMIN B12) 1000 MCG/ML injection INJECT 1 ML INTRAMUSCULARLY EVERY OTHER WEEK 07/22/23   Nafziger, Randel Buss, NP  diclofenac  Sodium (VOLTAREN ) 1 % GEL Apply 2 g topically 4 (four) times daily as needed (For pain). 07/31/22   Nafziger, Randel Buss, NP  ELIQUIS  5 MG TABS tablet TAKE 1 TABLET BY MOUTH TWICE A DAY 12/09/23   Nafziger, Randel Buss, NP  EPINEPHrine  0.3 mg/0.3 mL IJ SOAJ injection Inject 0.3 mg into the muscle once as needed for anaphylaxis. 12/24/23   Nafziger, Randel Buss, NP  Ferrous Gluconate-C-Folic Acid  (IRON-C PO) Take by mouth.    [provider]  ketoconazole  (NIZORAL ) 2 % shampoo USE TWICE WEEKLY 07/30/23   Nafziger, Randel Buss, NP  metFORMIN  (GLUCOPHAGE ) 500 MG tablet TAKE 1 TABLET BY MOUTH EVERY DAY WITH BREAKFAST 07/25/23   Nafziger, Randel Buss, NP  metoprolol  tartrate (LOPRESSOR ) 25 MG tablet TAKE 1 TABLET BY MOUTH TWICE A DAY 05/30/23   Nafziger, Randel Buss, NP  nitroGLYCERIN  (NITROSTAT ) 0.4 MG SL tablet Place 1 tablet (0.4 mg total) under the tongue every 5 (five) minutes as needed for chest pain (3 doses max). 12/24/23   Loyde Rule, MD  SYRINGE-NEEDLE, DISP, 3 ML (B-D 3CC LUER-LOK  SYR 25GX1") 25G X 1" 3 ML MISC USE AS DIRECTED TO INJECT B12 02/07/22   Nafziger, Randel Buss, NP      Allergies    Levaquin  [levofloxacin  in d5w], Other, Ivp dye [iodinated contrast media], and Metformin  and related    Review of Systems   Review of Systems  Constitutional:  Negative for fever.  Respiratory:  Negative for cough and shortness of breath.   Cardiovascular:  Positive for chest pain.  Gastrointestinal:  Positive for abdominal pain. Negative for diarrhea, hematemesis, hematochezia, nausea and vomiting.  Genitourinary:  Negative for dysuria and hematuria.    Physical Exam Updated Vital Signs BP (!) 146/70 (BP Location: Right Arm)   Pulse 68   Temp 97.7 F (36.5 C) (Temporal)   Resp 20   SpO2 99%  Physical Exam Vitals and  nursing note reviewed.  Constitutional:      General: He is not in acute distress.    Appearance: Normal appearance. He is well-developed.  HENT:     Head: Normocephalic and atraumatic.  Eyes:     Conjunctiva/sclera: Conjunctivae normal.  Cardiovascular:     Rate and Rhythm: Normal rate and regular rhythm.     Heart sounds: Normal heart sounds. No murmur heard. Pulmonary:     Effort: Pulmonary effort is normal. No respiratory distress.     Breath sounds: Normal breath sounds.  Abdominal:     Palpations: Abdomen is soft.     Tenderness: There is no abdominal tenderness. There is no guarding or rebound.  Musculoskeletal:        General: No tenderness or deformity.     Cervical back: Neck supple.     Right lower leg: No tenderness.     Left lower leg: No tenderness.  Skin:    General: Skin is warm and dry.     Capillary Refill: Capillary refill takes less than 2 seconds.  Neurological:     General: No focal deficit present.     Mental Status: He is alert.     ED Results / Procedures / Treatments   Labs (all labs ordered are listed, but only abnormal results are displayed) Labs Reviewed - No data to display  EKG EKG Interpretation Date/Time:  Sunday December 28 2023 16:53:31 EDT Ventricular Rate:  67 PR Interval:  140 QRS Duration:  112 QT Interval:  404 QTC Calculation: 426 R Axis:   -32  Text Interpretation: *** Critical Test Result: AV Block Sinus tachycardia with 2nd degree A-V block with 3:1 A-V conduction Left axis deviation Incomplete right bundle branch block Moderate voltage criteria for LVH, may be normal variant ( R in aVL , Cornell product ) Abnormal ECG When compared with ECG of 22-Feb-2023 12:10, av block is new Confirmed by Racheal Buddle [phone]) on 12/28/2023 5:08:51 PM  Radiology No results found.  Procedures Procedures  {Document cardiac monitor, telemetry assessment procedure when appropriate:1}  Medications Ordered in ED Medications  morphine  (PF)  4 MG/ML injection 4 mg (has no administration in time range)    ED Course/ Medical Decision Making/ A&P   {   Click here for ABCD2, HEART and other calculatorsREFRESH Note before signing :1}                              Medical Decision Making Amount and/or Complexity of Data Reviewed Labs: ordered. Radiology: ordered.  Risk Prescription drug management.   This patient complains of ***; this involves an  extensive number of treatment Options and is a complaint that carries with it a high risk of complications and morbidity. The differential includes ***  I ordered, reviewed and interpreted labs, which included *** I ordered medication *** and reviewed PMP when indicated. I ordered imaging studies which included *** and I independently    visualized and interpreted imaging which showed *** Additional history obtained from *** Previous records obtained and reviewed *** I consulted *** and discussed lab and imaging findings and discussed disposition.  Cardiac monitoring reviewed, *** Social determinants considered, *** Critical Interventions: ***  After the interventions stated above, I reevaluated the patient and found *** Admission and further testing considered, ***   {Document critical care time when appropriate:1} {Document review of labs and clinical decision tools ie heart score, Chads2Vasc2 etc:1}  {Document your independent review of radiology images, and any outside records:1} {Document your discussion with family members, caretakers, and with consultants:1} {Document social determinants of health affecting pt's care:1} {Document your decision making why or why not admission, treatments were needed:1} Final Clinical Impression(s) / ED Diagnoses Final diagnoses:  None    Rx / DC Orders ED Discharge Orders     None

## 2023-12-28 NOTE — Discharge Instructions (Signed)
 You were seen in the emergency department for chest and abdominal pain.  You had blood work EKG chest x-ray and a CAT scan of your abdomen and pelvis that did not show any obvious explanation for your symptoms.  Your symptoms had improved so I think it is reasonable for you to follow-up with your primary care doctor.  Return if any worsening or concerning symptoms

## 2023-12-28 NOTE — ED Notes (Signed)
 Pt aware of the need for a urine... Unable to currently provide a sample.Marland KitchenMarland Kitchen

## 2023-12-28 NOTE — ED Notes (Signed)
 Discharge paperwork given and verbally understood.

## 2023-12-28 NOTE — ED Triage Notes (Signed)
 Left flank pain started today around 4pm Chest pain left side  nausea

## 2024-01-06 ENCOUNTER — Inpatient Hospital Stay: Attending: Hematology

## 2024-01-06 ENCOUNTER — Inpatient Hospital Stay: Payer: Medicare Other

## 2024-01-06 ENCOUNTER — Encounter (HOSPITAL_COMMUNITY): Payer: Self-pay

## 2024-01-06 ENCOUNTER — Ambulatory Visit (HOSPITAL_COMMUNITY)

## 2024-01-06 DIAGNOSIS — I2694 Multiple subsegmental pulmonary emboli without acute cor pulmonale: Secondary | ICD-10-CM

## 2024-01-06 DIAGNOSIS — R2681 Unsteadiness on feet: Secondary | ICD-10-CM | POA: Diagnosis not present

## 2024-01-06 DIAGNOSIS — Z86711 Personal history of pulmonary embolism: Secondary | ICD-10-CM | POA: Insufficient documentation

## 2024-01-06 DIAGNOSIS — D472 Monoclonal gammopathy: Secondary | ICD-10-CM | POA: Diagnosis not present

## 2024-01-06 DIAGNOSIS — D72819 Decreased white blood cell count, unspecified: Secondary | ICD-10-CM | POA: Insufficient documentation

## 2024-01-06 DIAGNOSIS — R262 Difficulty in walking, not elsewhere classified: Secondary | ICD-10-CM | POA: Insufficient documentation

## 2024-01-06 DIAGNOSIS — M6281 Muscle weakness (generalized): Secondary | ICD-10-CM | POA: Diagnosis not present

## 2024-01-06 DIAGNOSIS — D649 Anemia, unspecified: Secondary | ICD-10-CM | POA: Insufficient documentation

## 2024-01-06 DIAGNOSIS — R2689 Other abnormalities of gait and mobility: Secondary | ICD-10-CM | POA: Insufficient documentation

## 2024-01-06 DIAGNOSIS — Z86718 Personal history of other venous thrombosis and embolism: Secondary | ICD-10-CM | POA: Diagnosis not present

## 2024-01-06 DIAGNOSIS — D696 Thrombocytopenia, unspecified: Secondary | ICD-10-CM | POA: Insufficient documentation

## 2024-01-06 DIAGNOSIS — R42 Dizziness and giddiness: Secondary | ICD-10-CM | POA: Insufficient documentation

## 2024-01-06 DIAGNOSIS — I82432 Acute embolism and thrombosis of left popliteal vein: Secondary | ICD-10-CM

## 2024-01-06 DIAGNOSIS — Z7901 Long term (current) use of anticoagulants: Secondary | ICD-10-CM

## 2024-01-06 DIAGNOSIS — M5459 Other low back pain: Secondary | ICD-10-CM | POA: Insufficient documentation

## 2024-01-06 DIAGNOSIS — D61818 Other pancytopenia: Secondary | ICD-10-CM

## 2024-01-06 LAB — CBC WITH DIFFERENTIAL/PLATELET
Abs Immature Granulocytes: 0.01 10*3/uL (ref 0.00–0.07)
Basophils Absolute: 0 10*3/uL (ref 0.0–0.1)
Basophils Relative: 1 %
Eosinophils Absolute: 0.1 10*3/uL (ref 0.0–0.5)
Eosinophils Relative: 2 %
HCT: 34.8 % — ABNORMAL LOW (ref 39.0–52.0)
Hemoglobin: 11.6 g/dL — ABNORMAL LOW (ref 13.0–17.0)
Immature Granulocytes: 0 %
Lymphocytes Relative: 37 %
Lymphs Abs: 1.6 10*3/uL (ref 0.7–4.0)
MCH: 33 pg (ref 26.0–34.0)
MCHC: 33.3 g/dL (ref 30.0–36.0)
MCV: 99.1 fL (ref 80.0–100.0)
Monocytes Absolute: 0.4 10*3/uL (ref 0.1–1.0)
Monocytes Relative: 9 %
Neutro Abs: 2.3 10*3/uL (ref 1.7–7.7)
Neutrophils Relative %: 51 %
Platelets: 89 10*3/uL — ABNORMAL LOW (ref 150–400)
RBC: 3.51 MIL/uL — ABNORMAL LOW (ref 4.22–5.81)
RDW: 13.2 % (ref 11.5–15.5)
Smear Review: DECREASED
WBC: 4.4 10*3/uL (ref 4.0–10.5)
nRBC: 0 % (ref 0.0–0.2)

## 2024-01-06 LAB — COMPREHENSIVE METABOLIC PANEL WITH GFR
ALT: 17 U/L (ref 0–44)
AST: 19 U/L (ref 15–41)
Albumin: 3.8 g/dL (ref 3.5–5.0)
Alkaline Phosphatase: 62 U/L (ref 38–126)
Anion gap: 8 (ref 5–15)
BUN: 27 mg/dL — ABNORMAL HIGH (ref 8–23)
CO2: 25 mmol/L (ref 22–32)
Calcium: 9.3 mg/dL (ref 8.9–10.3)
Chloride: 103 mmol/L (ref 98–111)
Creatinine, Ser: 0.88 mg/dL (ref 0.61–1.24)
GFR, Estimated: >60 mL/min (ref >60)
Glucose, Bld: 160 mg/dL — ABNORMAL HIGH (ref 70–99)
Potassium: 4.2 mmol/L (ref 3.5–5.1)
Sodium: 136 mmol/L (ref 135–145)
Total Bilirubin: 0.5 mg/dL (ref 0.0–1.2)
Total Protein: 6.6 g/dL (ref 6.5–8.1)

## 2024-01-06 LAB — FERRITIN: Ferritin: 56 ng/mL (ref 24–336)

## 2024-01-06 LAB — LACTATE DEHYDROGENASE: LDH: 151 U/L (ref 98–192)

## 2024-01-06 LAB — IRON AND TIBC
Iron: 109 ug/dL (ref 45–182)
Saturation Ratios: 37 % (ref 17.9–39.5)
TIBC: 293 ug/dL (ref 250–450)
UIBC: 184 ug/dL

## 2024-01-06 NOTE — Therapy (Signed)
 OUTPATIENT PHYSICAL THERAPY VESTIBULAR TREATMENT     Patient Name: Michael Johnston MRN: [id number] DOB:[birth year], 89 y.o., male Today's Date: 01/06/2024  END OF SESSION:  PT End of Session - 01/06/24 1523     Visit Number 2    Number of Visits 6    Date for PT Re-Evaluation 01/23/24    Authorization Type [employer]    Progress Note Due on Visit 6    PT Start Time 1434    PT Stop Time 1515    PT Time Calculation (min) 41 min    Activity Tolerance Patient tolerated treatment well;Other (comment)   increased dizziness througout session   Behavior During Therapy [facility] for tasks assessed/performed              Past Medical History:  Diagnosis Date   Arthritis    "right shoulder" (05/24/2015)   CHEST PAIN    Chronic bronchitis (HCC)    "get it ~ q yr" (05/24/2015)   COLONIC POLYPS, HX OF    COPD (chronic obstructive pulmonary disease) (HCC)    "dx'd but I don't take RX for it" (05/24/2015)   Coronary artery disease    Cystic kidney disease    GERD (gastroesophageal reflux disease)    History of hiatal hernia    MI (myocardial infarction) (HCC) 09/2010   Mixed hyperlipidemia    Skin cancer    "top of my head only" (05/24/2015)   Sleep apnea    Syncope and collapse ~ 2013; 05/24/2015   Past Surgical History:  Procedure Laterality Date   CARDIAC CATHETERIZATION  09/2010   CHOLECYSTECTOMY OPEN  1998   COLECTOMY  1998   "partial"   CORONARY ANGIOPLASTY     CORONARY ARTERY BYPASS GRAFT  09/2010   Median sternotomy for coronary artery bypass grafting x3  (left internal mammary artery to distal left anterior  descending  coronary artery, saphenous vein graft to first diagonal branch,  saphenous vein graft to first  obtuse marginal branch, endoscopic  saphenous vein harvest from right thigh). SURGEON:  Mendel Stain.  Alva Jewels, MD  ASSISTANT:  Lindi Revering, PA-C  ANESTHESIA:  Ernest Head, MD    LEFT HEART CATH AND CORS/GRAFTS ANGIOGRAPHY N/A 08/22/2017   Procedure: LEFT HEART  CATH AND CORS/GRAFTS ANGIOGRAPHY;  Surgeon: [place], Peter M, MD;  Location: [facility] INVASIVE CV LAB;  Service: Cardiovascular;  Laterality: N/A;   SKIN CANCER EXCISION     "top of my head"   Patient Active Problem List   Diagnosis Date Noted   Pulmonary embolus (HCC) 02/08/2022   Combined forms of age-related cataract of left eye 11/10/2018   Combined forms of age-related cataract of right eye 11/03/2018   Intraoperative floppy iris syndrome (IFIS) 11/03/2018   Urine stream spraying 02/23/2018   Flank pain 02/22/2018   Chest pain 08/21/2017   Unstable angina (HCC) 08/21/2017   Renal cyst 12/20/2016   B12 deficiency 11/08/2016   Type 2 diabetes mellitus with neurological complications (HCC) 11/08/2016   Syncope and collapse 05/24/2015   Benign prostatic hyperplasia with urinary obstruction 12/12/2014   Carotid artery stenosis 05/22/2012   CAD (coronary artery disease) 05/14/2012   ED (erectile dysfunction) of organic origin 08/19/2011   FH: CABG (coronary artery bypass surgery) 01/28/2011   Mixed hyperlipidemia 10/23/2010   GERD (gastroesophageal reflux disease) 10/22/2010   History of colonic polyps 06/14/2010    PCP: Alto Atta, NP REFERRING PROVIDER: Alto Atta, NP  REFERRING DIAG:  H81.13 (ICD-10-CM) - Benign paroxysmal  positional vertigo due to bilateral vestibular disorder    THERAPY DIAG:  Dizziness and giddiness  Difficulty in walking, not elsewhere classified  Impairment of balance  Unsteadiness on feet  Muscle weakness (generalized)  Other low back pain  ONSET DATE: 5 years or so  Rationale for Evaluation and Treatment: Rehabilitation  SUBJECTIVE:   SUBJECTIVE STATEMENT: Pt states he fell in the living room on Sunday in socks and slid on tile, fell on his butt and didn't hurt anything. Still dealing with the dizziness.  Reports some spinning on occasion over the years but now has affected his walking; he staggers and walks like a "drunk man".   Worse in the mornings.  Over a year since he has had any spinning; now just feels off balance; more off balance with looking down or looking up or moving head quickly.   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 Yes: NPRS scale: 0/10 currently Pain location: stomach Pain description: right abdominal area Aggravating factors: unknown Relieving factors: unknown  PRECAUTIONS: Fall  WEIGHT BEARING RESTRICTIONS: No  FALLS: Has patient fallen in last 6 months? Yes. Number of falls 3  LIVING ENVIRONMENT: Lives with: lives with their spouse Lives in: House/apartment Stairs:  ramped entry Has following equipment at home: Single point cane and Walker - 2 wheeled  PLOF: Independent  PATIENT GOALS: get some relief from dizziness  OBJECTIVE:  Note: Objective measures were completed at Evaluation unless otherwise noted.  DIAGNOSTIC FINDINGS:   COGNITION: Overall cognitive status: Within functional limits for tasks assessed   SENSATION: Tingling left foot  POSTURE:  rounded shoulders, forward head, and forward flexed trunk  Cervical ROM:  stifness noted with extension and right rotation especially  Active AROM (deg) eval  Flexion 30  Extension 35, dizziness  Right lateral flexion 35, tight  Left lateral flexion 30  Right rotation 50,tightness  Left rotation 55  (Blank rows = not tested)  STRENGTH: not tested  LOWER EXTREMITY MMT:   MMT Right eval Left eval  Hip flexion    Hip abduction    Hip adduction    Hip internal rotation    Hip external rotation    Knee flexion    Knee extension    Ankle dorsiflexion    Ankle plantarflexion    Ankle inversion    Ankle eversion    (Blank rows = not tested)  BED MOBILITY:  Not tested  TRANSFERS: Assistive device utilized: None  Sit to stand: Modified independence Stand to sit:  Modified independence Chair to chair:  not tested Floor:  not tested    GAIT: Gait pattern: decreased ankle dorsiflexion- Right, decreased ankle dorsiflexion- Left, trunk flexed, wide BOS, and poor foot clearance- Left Distance walked: 50 ft in clinic Assistive device utilized: None Level of assistance: Modified independence and SBA Comments: foot slap with ambulation  FUNCTIONAL TESTS:  SLS right foot 1"; left unable  PATIENT SURVEYS:  DHI 30/100  VESTIBULAR ASSESSMENT:  GENERAL OBSERVATION: patient ambulates with wide BOS and forward flexed trunk; hooded eyes   SYMPTOM BEHAVIOR:  Subjective history: see above  Non-Vestibular symptoms: changes in hearing and changes in vision  Type of dizziness: Blurred Vision, Imbalance (Disequilibrium), and Unsteady with head/body turns  Frequency: frequently throughout the day  Duration: short lasting   Aggravating factors: Induced by motion: looking up at the  ceiling, turning head quickly, sitting in a moving car, and activity in general, Worse in the morning, Worse in the dark, Worse outside or in busy environment, and Moving eyes  Relieving factors: head stationary, slow movements, and avoid busy/distracting environments  Progression of symptoms: unchanged  OCULOMOTOR EXAM:  Ocular Alignment: normal  Ocular ROM: No Limitations  Spontaneous Nystagmus: absent  Gaze-Induced Nystagmus: absent  Smooth Pursuits: saccades  Saccades: extra eye movements  Convergence/Divergence: 3 cm   VESTIBULAR - OCULAR REFLEX:   Slow VOR: Positive Bilaterally  VOR Cancellation: Normal  Head-Impulse Test: HIT Right: positive HIT Left: positive  Dynamic Visual Acuity:  not assessed   POSITIONAL TESTING: Other: not tested at eval  MOTION SENSITIVITY:  Motion Sensitivity Quotient Intensity: 0 = none, 1 = Lightheaded, 2 = Mild, 3 = Moderate, 4 = Severe, 5 = Vomiting  Intensity  1. Sitting to supine   2. Supine to L side   3. Supine to R side   4.  Supine to sitting   5. L Hallpike-Dix   6. Up from L    7. R Hallpike-Dix   8. Up from R    9. Sitting, head tipped to L knee   10. Head up from L knee   11. Sitting, head tipped to R knee   12. Head up from R knee   13. Sitting head turns x5 4  14.Sitting head nods x5 3  15. In stance, 180 turn to L    16. In stance, 180 turn to R     OTHOSTATICS: not done  FUNCTIONAL GAIT: 5 times sit to stand: next visit SLS  right foot 1" and left unable                                                                                                                             TREATMENT DATE:  01/06/2024  Assessment: Cervical ROM [employer] pike, negative both sides (dizziness noted upon sitting up)   Neuromuscular Re-education: -Standing Balance Normal bos, EO, 30 seconds -Standing Balance Normal bos, EC, 30 seconds (hip strategy used heavily -4 inch step ups, 2 sets of 5 reps, inconsistent foot placement, foot drop noted bilaterally, CGA needed -VOR vertical, 30 second bouts, 2 reps (dizziness noted), pt cued for increased speed for increased symptom reproduction -VOR horizontal, 30 second bouts, 2 reps (dizziness noted), pt cued for decreased ROM so both eye can stay focused on target    12/12/23 physical therapy evaluation and HEP instruction   Canalith Repositioning:  Comment: negative bilaterally, 01/06/24 Gaze Adaptation:  x1 Viewing Horizontal: Position: sitting, Time: 10 reps, Reps: 10, and Comment: 4/5 dizziness and x1 Viewing Vertical:  Position: sitting , Reps: 10, and Comment: 3/5 dizziness Habituation:  Other: not today Other: not today  PATIENT EDUCATION: Education details: Patient educated on exam findings, POC, scope of PT, HEP, and what to expect next visit. Person educated: Patient Education method: Explanation, Demonstration, and Handouts  Education comprehension: verbalized understanding, returned demonstration, verbal cues required, and tactile cues  required  HOME EXERCISE PROGRAM: Access Code: [email or web address] URL: [email or web address] Date: 12/12/2023 Prepared by: AP - Rehab  Exercises - Seated Gaze Stabilization with Head Nod  - 5 x daily - 7 x weekly - 60 hold - Seated Gaze Stabilization with Head Rotation  - 5 x daily - 7 x weekly - 60 hold GOALS: Goals reviewed with patient? No  SHORT TERM GOALS: Target date: 01/02/24  patient will be independent with initial HEP  Baseline: Goal status: INITIAL  2.  Patient will report 50% improvement overall  Baseline:  Goal status: INITIAL  3.  Patient will be able to stand on each leg SLS x 4" to demonstrate improved functional balance.  Baseline:  Goal status: INITIAL   LONG TERM GOALS: Target date: 01/23/2024  Patient will be independent in self management strategies to improve quality of life and functional outcomes.  Baseline:  Goal status: INITIAL  2.  Patient will report 75% improvement overall  Baseline:  Goal status: INITIAL  3.  Patient will improve DHI score by 10 points to demonstrate decreased dizziness with functional activity Baseline: 30/100 Goal status: INITIAL  4.  Patient will remain free of falls Baseline:  Goal status: INITIAL   ASSESSMENT:  CLINICAL IMPRESSION: Patient continues to demonstrate dizziness with functional transfers, decreased LE strength, decreased gait quality and balance. Pt presents with SPC and has had at least one fall since last session, no injuries reported. Patient also demonstrates decreased endurance with aerobic based exercise during today's session requiring several seated rest breaks throughout. Pt tests negative in bilateral [facility] test, however dizziness noted upon sitting up from supine position. Pt postive for horizontal head thrust special test, continuing evidence for deficits with VOR. Patient able to progress dynamic balance and core activation exercises today with step ups, need for gait belt  and CGA with verbal cueing for increased foot clearance. Patient would continue to benefit from skilled physical therapy for decreased dizziness, increased endurance with ambulation, increased LE strength, and improved balance for improved quality of life, improved independence with gait training and continued progress towards therapy goals.   Patient is a 89 y.o. male who was seen today for physical therapy evaluation and treatment for Benign paroxysmal positional vertigo due to bilateral vestibular disorder. Patient demonstrates impaired VOR, balance deficits and gait abnormalities which are negatively impacting patient ability to perform ADLs and functional mobility tasks. Patient will benefit from skilled physical therapy services to address these deficits to improve level of function with ADLs, functional mobility tasks, and reduce risk for falls.   OBJECTIVE IMPAIRMENTS: Abnormal gait, decreased balance, decreased knowledge of condition, dizziness, impaired perceived functional ability, and pain.   ACTIVITY LIMITATIONS: carrying, lifting, bending, standing, stairs, transfers, and locomotion level  PARTICIPATION LIMITATIONS: cleaning, driving, and yard work  PERSONAL FACTORS:  HOH and impaired vision  are also affecting patient's functional outcome.   REHAB POTENTIAL: Good  CLINICAL DECISION MAKING: Evolving/moderate complexity  EVALUATION COMPLEXITY: Moderate   PLAN:  PT FREQUENCY: 1x/week  PT DURATION: 6 weeks  PLANNED INTERVENTIONS: [id number]- PT Re-evaluation, 97110-Therapeutic exercises, 97530- Therapeutic activity, 97112- Neuromuscular re-education, 97535- Self Care, [id number]- Manual therapy, [phone]- Gait training, [phone]- Orthotic Fit/training, [phone]- Canalith repositioning, [id number]- Aquatic Therapy, [phone]- Splinting, Patient/Family education, Balance training, Stair training, Taping, Dry Needling, Joint mobilization, Joint manipulation, Spinal manipulation, Spinal mobilization, Scar  mobilization, and DME instructions.   PLAN FOR NEXT SESSION: Progress VOR and  balance training, maybe educate on AFO to address drop foot?   Armond Bertin, PT, DPT [facility] Office: [phone] 5:18 PM, 01/06/24

## 2024-01-07 LAB — KAPPA/LAMBDA LIGHT CHAINS
Kappa free light chain: 66.4 mg/L — ABNORMAL HIGH (ref 3.3–19.4)
Kappa, lambda light chain ratio: 4.37 — ABNORMAL HIGH (ref 0.26–1.65)
Lambda free light chains: 15.2 mg/L (ref 5.7–26.3)

## 2024-01-08 LAB — PROTEIN ELECTROPHORESIS, SERUM
A/G Ratio: 1.4 (ref 0.7–1.7)
Albumin ELP: 3.8 g/dL (ref 2.9–4.4)
Alpha-1-Globulin: 0.2 g/dL (ref 0.0–0.4)
Alpha-2-Globulin: 0.7 g/dL (ref 0.4–1.0)
Beta Globulin: 1.4 g/dL — ABNORMAL HIGH (ref 0.7–1.3)
Gamma Globulin: 0.5 g/dL (ref 0.4–1.8)
Globulin, Total: 2.7 g/dL (ref 2.2–3.9)
M-Spike, %: 0.7 g/dL — ABNORMAL HIGH
Total Protein ELP: 6.5 g/dL (ref 6.0–8.5)

## 2024-01-08 LAB — UPEP/UIFE/LIGHT CHAINS/TP, 24-HR UR
% BETA, Urine: 25 %
ALPHA 1 URINE: 5.6 %
Albumin, U: 25.4 %
Alpha 2, Urine: 16.4 %
Free Kappa Lt Chains,Ur: 61.46 mg/L (ref 1.17–86.46)
Free Kappa/Lambda Ratio: 29.98 — ABNORMAL HIGH (ref 1.83–14.26)
Free Lambda Lt Chains,Ur: 2.05 mg/L (ref 0.27–15.21)
GAMMA GLOBULIN URINE: 27.7 %
M-SPIKE %, Urine: 15.7 % — ABNORMAL HIGH
M-Spike, Mg/24 Hr: 19 mg/(24.h) — ABNORMAL HIGH
Total Protein, Urine-Ur/day: 119 mg/(24.h) (ref 30–150)
Total Protein, Urine: 6.6 mg/dL
Total Volume: 1800

## 2024-01-12 NOTE — Progress Notes (Unsigned)
 [facility] [address], [place] [address]   CLINIC:  Medical Oncology/Hematology  PCP:  Alto Atta, NP [address] [place] [address] [phone]   REASON FOR VISIT:  Follow-up for unprovoked bilateral DVT and bilateral PE + MGUS + thrombocytopenia   CURRENT THERAPY: Eliquis    INTERVAL HISTORY:   Michael Johnston 89 y.o. male returns for routine follow-up of his DVT and PE, on chronic anticoagulation with Eliquis .  He was last seen by Sheril Dines PA-C on 07/08/2023.  At today's visit, he reports feeling fairly well.  He has not had any hospitalizations or major changes in his health since his last visit.  He does note some moderate fatigue and that he tires easily.  He continues to take Eliquis  5 mg twice daily, and is tolerating this well.   He denies any major bleeding events, epistaxis, hematemesis, or melena; he did note some dark stool from taking iron supplement.  He bruises easily, but denies any petechial rash.  He has some mild bilateral ankle swelling for the past few weeks, but denies any unilateral leg edema.  He denies any recurrent chest pain.  No dyspnea with exertion, shortness of breath at rest, or hemoptysis.  He has fallen about 6 times in the past 6 months; reports that he hit his head once but did not seek medical attention; no bleeding injuries as a result of falling.  He denies any new bone pain or recent fractures.  No new neurologic symptoms such as tinnitus, new-onset hearing loss, blurred vision, or headache. He does also have occasional vertigo.  He has some tingling in bilateral fingertips.  He denies any masses or lymphadenopathy.  He has occasional night sweats associated with feeling hot/flushed, that are relieved after he removes one of his blankets.  He has not had any unexplained fevers or chills.  His weight is stable.  He reports good appetite, but is very careful about what he eats due to his diabetes.  He  has 25% energy and 100% appetite.    ASSESSMENT & PLAN:  1.  Unprovoked bilateral DVT and bilateral PE - Seen  at the request of hospitalist (Dr. Nichole Barker) and PCP (NP Alto Atta) for evaluation of bilateral DVT and bilateral pulmonary embolism. -  Patient was seen by his PCP on 02/07/2021 for complaints of bilateral lower extremity edema.  D-dimer found to be significantly elevated at 11.01, therefore patient was sent to the ED where he was found to have bilateral age-indeterminate DVT with acute bilateral PE Vascular US  bilateral lower extremity venous (02/08/2022): Age-indeterminate DVT of bilateral lower extremities (right posterior tibial and peroneal veins + left popliteal vein, posterior tibial vein, and left peroneal veins) CTA chest (02/08/2022): Acute subsegmental pulmonary emboli in right upper and lower lobe, small clot burden 2D echo (02/09/2022): LVEF 60 to 65%, mildly reduced RV systolic function - Initially treated with IV heparin  while hospitalized, transitioned to oral Eliquis  at discharge. - Patient remained on Eliquis  through 01/09/2023, and opted to discontinue due to age, fall risk, and concerns regarding cost. - Patient stopped taking Eliquis  as of 01/09/2023 - subsequently presented to the ED on 01/20/2023 with acute left leg swelling and pain.  LLE venous ultrasound (01/20/2023) was positive for DVT in left popliteal vein and posterior tibial vein.  He was restarted on Eliquis  during ED visit on 01/20/2023. - LLE venous US  from 01/20/2023 did not comment on chronicity of DVT of left popliteal vein and posterior  tibial vein (and there had been no interval imaging since the first of DVTs were diagnosed in June 2023).  However, this is suspected to be acute recurrent DVT or extension of chronic DVT based on acute symptomatology, elevated D-dimer (0.91 on 01/24/2023), temporal relationship to discontinuation of anticoagulation, and acute drop in platelets. - Venous US  (06/19/2023): No evidence of  DVT in bilateral lower extremities - No obvious provoking factors of his DVT/PE events. - He denies any major bleeding events - Patient has fallen about once a month for the past 6 months, primarily related to "losing his balance."  He has a cane, but does not like to use it. - Most recent D-dimer (06/18/2023): Undetectable at <0.27 - PLAN: Since patient had apparent recurrent DVT after discontinuation of Eliquis , recommend continuing indefinite anticoagulation until such time that risk of bleeding outweighs risk of clotting events. - We discussed risks and benefits of prolonged anticoagulation, including risk of life-threatening bleeding event, which is considered to be less than the risk of recurrent blood clot if he were not on anticoagulation. - We discussed patient's risk of falls and education on home safety was provided.  Patient was STRONGLY recommended to use cane. - Patient instructed to seek medical attention if he has any falls where he strikes his head. - Patient remains eligible for Eliquis  5 mg twice daily dosing (based on age >80 years, we would consider decreasing to 2.5 mg twice daily if he had body weight <60 kg or serum creatinine >1.5 mg) - No utility for hypercoagulable studies, since this would not change management.   2.  MGUS, IgG kappa - Workup of anemia revealed MGUS in October 2024 - Initial MGUS/myeloma labs (06/18/2023): Immunofixation = IgG kappa monoclonal protein SPEP shows M spike = 0.6% Elevated kappa free light chain 72.4 with normal lambda 17.1, elevated FLC ratio 4.23 No CRAB features at this time: Hgb 12.6, creatinine 1.12, calcium  9.6 - Skeletal survey (06/25/2023): No suspicious lytic or sclerotic bone lesion identified - 24-hour urine/UPEP/UIFE (01/06/2024): Bence-Jones protein positive, kappa type Urine M spike = 19 mg/24-hr - Most recent MGUS/myeloma panel (01/06/2024): M spike 0.7% next 1 elevated kappa light chains 66.4, normal lambda 15.2, elevated  FLC ratio 4.37. Hgb 11.6, creatinine 0.88, calcium  9.3, normal LDH. - No B symptoms, new onset bone pain, or neurologic changes - Have discussed at length with patient regarding diagnosis and prognosis of MGUS, including 1% chance per year of progression to multiple myeloma - PLAN: MGUS labs overall stable.  No evidence of multiple myeloma at this time. - Recheck MGUS/myeloma labs in 6 months. - Will check 24-hour urine with UPEP/UIFE in 6 months - Annual skeletal survey due in 6 months October 2025  3.  Anemia, normocytic/macrocytic - Mild to moderate anemia since at least 2012.  Baseline Hgb for the past year around 11.0-13.0. - He had normal B12, MMA, folate, copper , homocystine in January 2024. - Labs from October 2024 showed normal B12, MMA, folate.  Reticulocytes 0.7% (reticulocyte index 0.67, hypoproliferative).  Normal erythropoietin  9.7. - He takes vitamin B12 injection at home every 2 weeks, prescribed by PCP. - He has been taking iron tablet daily since July 2024  - Denies any rectal bleeding, melena, or epistaxis. - Most recent labs (01/06/2024): Hgb 11.6/MCV 99.1.   Ferritin 56, iron saturation 37%.  (Labs from October 2024 showed B12 780, MMA normal, folate 15.5.) Normal creatinine and LDH. - DIFFERENTIAL DIAGNOSIS includes iron deficiency anemia, anemia of chronic disease.  Since  patient also has having thrombocytopenia and leukopenia, there is some moderate suspicion that he may have some developing bone marrow infiltrative process such as MDS.   - PLAN: Hemoglobin and ferritin trending slowly downward. - Recommend IV iron with Feraheme x 2  (discussed potential side effects and risk of reaction.  Patient is agreeable to proceed)  - Continue iron tablet EOD  - Repeat labs and RTC in 6 months  - If major deviations from baseline, would consider bone marrow biopsy.  Patient prefers to defer BMBx at this time.  4.  Thrombocytopenia (mild) & leukopenia - Mild intermittent  thrombocytopenia since at least 2012.  Baseline platelets usually >100, but with transient worsening of thrombocytopenia during hospital stays. - CT abdomen/pelvis (09/15/2020): Liver and spleen unremarkable. - Hematology workup (09/18/2022): CMP unremarkable. Normal copper , folate, homocystine, B12 and MMA. Immature platelet fraction 7.3 Negative ANA and rheumatoid factor. - Reports easy bruising.  No petechial rash, bleeding, or B symptoms. - Acute worsening of thrombocytopenia with platelets 65 on 01/19/2023, likely due to platelet consumption in the setting of acute DVT.  Platelets improved to 111 as of 01/24/2023. - Most recent labs (01/06/2024): Platelets 89.  WBC 4.4 with normal differential.  Normal LDH. - DIFFERENTIAL DIAGNOSIS immune mediated thrombocytopenia, or bone marrow infiltrative process such as MDS - PLAN: No treatment at this time.  Continue surveillance.   - It is reasonable to continue patient on Eliquis  as long as his platelets remain >50,000. - If major deviations from baseline, would consider bone marrow biopsy.  Patient prefers to defer BMBx at this time.  5.  Other history - PMH: Coronary artery disease s/p CABG, cystic kidney disease, GERD, hyperlipidemia, skin cancer, sleep apnea (does not use CPAP), and arthritis. - SOCIAL: Patient lives at home with his wife of 67 years.  He is very active.  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  He smoked when he was a teenager.  He denies any alcohol or illicit drug use. - FAMILY: Family history is negative for any blood clots or known hypercoagulable states.  Sister passed away from unspecified cancer.  PLAN SUMMARY:  >> IV iron w/ Feraheme x2 >> Labs in 6 months = CBC/D, CMP, LDH, SPEP, light chains, ferritin, iron/TIBC >> 24-hour urine (UPEP/UIFE) in 6 months >> SKELETAL SURVEY in 6 months  >> OFFICE visit in 6 months (1 week after labs/x-ray)     REVIEW OF SYSTEMS:  Review of Systems   Constitutional:  Positive for fatigue. Negative for appetite change, chills, diaphoresis, fever and unexpected weight change.  HENT:   Negative for lump/mass, nosebleeds and trouble swallowing (difficulty chewing).   Eyes:  Negative for eye problems.  Respiratory:  Negative for cough, hemoptysis and shortness of breath.   Cardiovascular:  Negative for chest pain, leg swelling and palpitations.  Gastrointestinal:  Negative for abdominal pain, blood in stool, constipation, diarrhea, nausea and vomiting.  Genitourinary:  Negative for hematuria.   Musculoskeletal:  Positive for arthralgias and gait problem (loses balance).  Skin: Negative.   Neurological:  Positive for dizziness, gait problem (loses balance) and numbness (tingling in fingertips). Negative for headaches and light-headedness.  Hematological:  Does not bruise/bleed easily.     PHYSICAL EXAM:  ECOG PERFORMANCE STATUS: 1 - Symptomatic but completely ambulatory  Vitals:   01/13/24 1011 01/13/24 1016  BP: (!) 158/66 (!) 156/83  Pulse: (!) 54   Resp: 17   Temp: 97.9 F (36.6 C)  SpO2: 98%     Filed Weights   01/13/24 1011  Weight: 173 lb (78.5 kg)    Physical Exam Constitutional:      Appearance: Normal appearance. He is normal weight.  Cardiovascular:     Rate and Rhythm: Bradycardia present.     Heart sounds: Normal heart sounds.     Comments: HR 54 Pulmonary:     Breath sounds: Normal breath sounds.  Neurological:     General: No focal deficit present.     Mental Status: Mental status is at baseline.  Psychiatric:        Behavior: Behavior normal. Behavior is cooperative.    PAST MEDICAL/SURGICAL HISTORY:  Past Medical History:  Diagnosis Date   Arthritis    "right shoulder" (05/24/2015)   CHEST PAIN    Chronic bronchitis (HCC)    "get it ~ q yr" (05/24/2015)   COLONIC POLYPS, HX OF    COPD (chronic obstructive pulmonary disease) (HCC)    "dx'd but I don't take RX for it" (05/24/2015)   Coronary  artery disease    Cystic kidney disease    GERD (gastroesophageal reflux disease)    History of hiatal hernia    MI (myocardial infarction) (HCC) 09/2010   Mixed hyperlipidemia    Skin cancer    "top of my head only" (05/24/2015)   Sleep apnea    Syncope and collapse ~ 2013; 05/24/2015   Past Surgical History:  Procedure Laterality Date   CARDIAC CATHETERIZATION  09/2010   CHOLECYSTECTOMY OPEN  1998   COLECTOMY  1998   "partial"   CORONARY ANGIOPLASTY     CORONARY ARTERY BYPASS GRAFT  09/2010   Median sternotomy for coronary artery bypass grafting x3  (left internal mammary artery to distal left anterior  descending  coronary artery, saphenous vein graft to first diagonal branch,  saphenous vein graft to first  obtuse marginal branch, endoscopic  saphenous vein harvest from right thigh). SURGEON:  Mendel Stain.  Alva Jewels, MD  ASSISTANT:  Lindi Revering, PA-C  ANESTHESIA:  Ernest Head, MD    LEFT HEART CATH AND CORS/GRAFTS ANGIOGRAPHY N/A 08/22/2017   Procedure: LEFT HEART CATH AND CORS/GRAFTS ANGIOGRAPHY;  Surgeon: [place], Peter M, MD;  Location: [facility] INVASIVE CV LAB;  Service: Cardiovascular;  Laterality: N/A;   SKIN CANCER EXCISION     "top of my head"    SOCIAL HISTORY:  Social History   Socioeconomic History   Marital status: Married    Spouse name: Not on file   Number of children: Not on file   Years of education: Not on file   Highest education level: Not on file  Occupational History   Not on file  Tobacco Use   Smoking status: Former    Current packs/day: 0.00    Average packs/day: 1.5 packs/day for 3.0 years (4.5 ttl pk-yrs)    Types: Cigarettes    Start date: 12/08/1948    Quit date: 12/09/1951    Years since quitting: 72.1   Smokeless tobacco: Never  Vaping Use   Vaping status: Never Used  Substance and Sexual Activity   Alcohol use: Not Currently    Comment: "no alcohol since 1953"   Drug use: No   Sexual activity: Not on file  Other Topics Concern   Not on file   Social History Narrative   Retired    Is a Education officer, 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 [place]    Social Drivers of Corporate investment banker Strain:  Low Risk  (10/16/2021)   Overall Financial Resource Strain (CARDIA)    Difficulty of Paying Living Expenses: Not hard at all  Food Insecurity: Low Risk  (11/27/2023)   Received from [facility]   Hunger Vital Sign    Worried About Running Out of Food in the Last Year: Never true    Ran Out of Food in the Last Year: Never true  Transportation Needs: No Transportation Needs (11/27/2023)   Received from [facility]    In the past 12 months, has lack of reliable transportation kept you from medical appointments, meetings, work or from getting things needed for daily living? : No  Physical Activity: Insufficiently Active (10/16/2021)   Exercise Vital Sign    Days of Exercise per Week: 2 days    Minutes of Exercise per Session: 40 min  Stress: No Stress Concern Present (10/16/2021)   [employer] of Occupational Health - Occupational Stress Questionnaire    Feeling of Stress : Not at all  Social Connections: Socially Integrated (10/16/2021)   Social Connection and Isolation Panel [NHANES]    Frequency of Communication with Friends and Family: More than three times a week    Frequency of Social Gatherings with Friends and Family: More than three times a week    Attends Religious Services: More than 4 times per year    Active Member of [employer] or Organizations: Yes    Attends Banker Meetings: More than 4 times per year    Marital Status: Married  Catering manager Violence: Not At Risk (10/16/2021)   Humiliation, Afraid, Rape, and Kick questionnaire    Fear of Current or Ex-Partner: No    Emotionally Abused: No    Physically Abused: No    Sexually Abused: No    FAMILY HISTORY:  Family History  Problem Relation Age of Onset   Lung cancer Sister    Heart attack Father     CURRENT MEDICATIONS:  Outpatient Encounter  Medications as of 01/13/2024  Medication Sig Note   aspirin  81 MG tablet Take 81 mg by mouth daily. 02/22/2023: Took 4 tablets today10.20 am ( 02/22/23)   atorvastatin  (LIPITOR) 20 MG tablet TAKE 1 TABLET BY MOUTH EVERY DAY    cyanocobalamin  (VITAMIN B12) 1000 MCG/ML injection INJECT 1 ML INTRAMUSCULARLY EVERY OTHER WEEK    diclofenac  Sodium (VOLTAREN ) 1 % GEL Apply 2 g topically 4 (four) times daily as needed (For pain).    ELIQUIS  5 MG TABS tablet TAKE 1 TABLET BY MOUTH TWICE A DAY    EPINEPHrine  0.3 mg/0.3 mL IJ SOAJ injection Inject 0.3 mg into the muscle once as needed for anaphylaxis.    Ferrous Gluconate-C-Folic Acid  (IRON-C PO) Take by mouth.    ketoconazole  (NIZORAL ) 2 % shampoo USE TWICE WEEKLY    metFORMIN  (GLUCOPHAGE ) 500 MG tablet TAKE 1 TABLET BY MOUTH EVERY DAY WITH BREAKFAST    metoprolol  tartrate (LOPRESSOR ) 25 MG tablet TAKE 1 TABLET BY MOUTH TWICE A DAY    nitroGLYCERIN  (NITROSTAT ) 0.4 MG SL tablet Place 1 tablet (0.4 mg total) under the tongue every 5 (five) minutes as needed for chest pain (3 doses max).    SYRINGE-NEEDLE, DISP, 3 ML (B-D 3CC LUER-LOK SYR 25GX1") 25G X 1" 3 ML MISC USE AS DIRECTED TO INJECT B12    No facility-administered encounter medications on file as of 01/13/2024.    ALLERGIES:  Allergies  Allergen Reactions   Levaquin  [Levofloxacin  In D5w] Swelling and Other (See Comments)  Eyes swollen   Other Anaphylaxis and Other (See Comments)    Bee sting   Ivp Dye [Iodinated Contrast Media] Other (See Comments)    Patient reports seizure   Metformin  And Related Nausea Only    LABORATORY DATA:  I have reviewed the labs as listed.  CBC    Component Value Date/Time   WBC 4.4 01/06/2024 1301   RBC 3.51 (L) 01/06/2024 1301   HGB 11.6 (L) 01/06/2024 1301   HCT 34.8 (L) 01/06/2024 1301   PLT 89 (L) 01/06/2024 1301   MCV 99.1 01/06/2024 1301   MCH 33.0 01/06/2024 1301   MCHC 33.3 01/06/2024 1301   RDW 13.2 01/06/2024 1301   LYMPHSABS 1.6 01/06/2024  1301   MONOABS 0.4 01/06/2024 1301   EOSABS 0.1 01/06/2024 1301   BASOSABS 0.0 01/06/2024 1301      Latest Ref Rng & Units 01/06/2024    1:01 PM 12/28/2023    5:44 PM 08/26/2023   10:42 AM  CMP  Glucose 70 - 99 mg/dL 782  956  213   BUN 8 - 23 mg/dL 27  32  23   Creatinine 0.61 - 1.24 mg/dL 0.86  5.78  4.69   Sodium 135 - 145 mmol/L 136  139  140   Potassium 3.5 - 5.1 mmol/L 4.2  4.1  4.2   Chloride 98 - 111 mmol/L 103  103  101   CO2 22 - 32 mmol/L 25  29  32   Calcium  8.9 - 10.3 mg/dL 9.3  9.2  9.1   Total Protein 6.5 - 8.1 g/dL 6.6  6.4  6.4   Total Bilirubin 0.0 - 1.2 mg/dL 0.5  0.4  0.6   Alkaline Phos 38 - 126 U/L 62  57  69   AST 15 - 41 U/L 19  17  23    ALT 0 - 44 U/L 17  15  24      DIAGNOSTIC IMAGING:  I have independently reviewed the relevant imaging and discussed with the patient.   WRAP UP:  All questions were answered. The patient knows to call the clinic with any problems, questions or concerns.  Medical decision making: Moderate  Time spent on visit: I spent 20 minutes counseling the patient face to face. The total time spent in the appointment was 30 minutes and more than 50% was on counseling.  Michael Dusky, PA-C  01/13/24 12:17 PM

## 2024-01-13 ENCOUNTER — Inpatient Hospital Stay: Payer: Medicare Other | Attending: Physician Assistant | Admitting: Physician Assistant

## 2024-01-13 ENCOUNTER — Ambulatory Visit (HOSPITAL_COMMUNITY): Attending: Adult Health

## 2024-01-13 ENCOUNTER — Encounter: Payer: Self-pay | Admitting: Physician Assistant

## 2024-01-13 VITALS — BP 156/83 | HR 54 | Temp 97.9°F | Resp 17 | Ht 69.5 in | Wt 173.0 lb

## 2024-01-13 DIAGNOSIS — M5459 Other low back pain: Secondary | ICD-10-CM | POA: Diagnosis not present

## 2024-01-13 DIAGNOSIS — D61818 Other pancytopenia: Secondary | ICD-10-CM

## 2024-01-13 DIAGNOSIS — D472 Monoclonal gammopathy: Secondary | ICD-10-CM | POA: Insufficient documentation

## 2024-01-13 DIAGNOSIS — Z86718 Personal history of other venous thrombosis and embolism: Secondary | ICD-10-CM | POA: Diagnosis not present

## 2024-01-13 DIAGNOSIS — R2689 Other abnormalities of gait and mobility: Secondary | ICD-10-CM | POA: Insufficient documentation

## 2024-01-13 DIAGNOSIS — R262 Difficulty in walking, not elsewhere classified: Secondary | ICD-10-CM | POA: Diagnosis not present

## 2024-01-13 DIAGNOSIS — I82432 Acute embolism and thrombosis of left popliteal vein: Secondary | ICD-10-CM | POA: Diagnosis not present

## 2024-01-13 DIAGNOSIS — Q619 Cystic kidney disease, unspecified: Secondary | ICD-10-CM | POA: Diagnosis not present

## 2024-01-13 DIAGNOSIS — D649 Anemia, unspecified: Secondary | ICD-10-CM | POA: Diagnosis not present

## 2024-01-13 DIAGNOSIS — R2681 Unsteadiness on feet: Secondary | ICD-10-CM | POA: Diagnosis not present

## 2024-01-13 DIAGNOSIS — D696 Thrombocytopenia, unspecified: Secondary | ICD-10-CM | POA: Diagnosis not present

## 2024-01-13 DIAGNOSIS — I2510 Atherosclerotic heart disease of native coronary artery without angina pectoris: Secondary | ICD-10-CM | POA: Diagnosis not present

## 2024-01-13 DIAGNOSIS — Z86711 Personal history of pulmonary embolism: Secondary | ICD-10-CM | POA: Insufficient documentation

## 2024-01-13 DIAGNOSIS — M6281 Muscle weakness (generalized): Secondary | ICD-10-CM | POA: Insufficient documentation

## 2024-01-13 DIAGNOSIS — I2694 Multiple subsegmental pulmonary emboli without acute cor pulmonale: Secondary | ICD-10-CM

## 2024-01-13 DIAGNOSIS — R42 Dizziness and giddiness: Secondary | ICD-10-CM | POA: Diagnosis not present

## 2024-01-13 DIAGNOSIS — D509 Iron deficiency anemia, unspecified: Secondary | ICD-10-CM | POA: Insufficient documentation

## 2024-01-13 DIAGNOSIS — Z7901 Long term (current) use of anticoagulants: Secondary | ICD-10-CM | POA: Diagnosis not present

## 2024-01-13 HISTORY — DX: Iron deficiency anemia, unspecified: D50.9

## 2024-01-13 NOTE — Patient Instructions (Addendum)
 [facility] at [facility] **VISIT SUMMARY & IMPORTANT INSTRUCTIONS **   You were seen today by Sheril Dines PA-C for your blood clot and low platelets.    BLOOD CLOTS Continue to take Eliquis  5 mg twice daily. Taking Eliquis  does come with increased risk of bleeding.  It is extremely important that you practice safety at home to avoid falls. If you have any major bleeding events or fall and hit your head, seek immediate medical attention. Please see attached handout for additional information regarding Eliquis  and home safety.  LOW PLATELETS/WHITE BLOOD CELLS & ANEMIA You continue to have mildly low platelets and mild anemia. This may be evidence of a developing bone marrow disorder (such as possible MDS), but you do not need any treatment for your low platelets or anemia at this time. We will continue to monitor your platelets and anemia follow-up visits.  IRON DEFICIENCY: Your iron levels are lower than at your last visit. We will schedule you for IV iron x 2.  (Please see attached handout for information regarding possible side effects.) Continue taking iron tablet (ferrous sulfate 325 mg) 3 times each week.   MGUS ("monoclonal gammopathy of undetermined significance") As we discussed, this is a "precancerous" condition where you have slightly increased plasma cells making a slightly increased amount of abnormal immunoglobulin proteins. Although MGUS is not causing any current problems in your body, it has a 1% each year risk of progression to multiple myeloma cancer. We will continue to monitor your labs every 6 months. We will also check your urine study again in 6 months. Please bring completed urine kit to your lab appointment in 6 months. We will check whole-body x-rays to make sure you do not have any signs of cancer in your bones. Please see the attached handout for further information regarding MGUS.   FOLLOW-UP APPOINTMENT: 6 months  ** Thank  you for trusting me with your healthcare!  I strive to provide all of my patients with quality care at each visit.  If you receive a survey for this visit, I would be so grateful to you for taking the time to provide feedback.  Thank you in advance!  ~ Kylen Schliep                   Dr. Paulett Boros   &   Sheril Dines, PA-C   - - - - - - - - - - - - - - - - - -    Thank you for choosing [facility] at [facility] to provide your oncology and hematology care.  To afford each patient quality time with our provider, please arrive at least 15 minutes before your scheduled appointment time.   If you have a lab appointment with the Cancer Center please come in thru the Main Entrance and check in at the main information desk.  You need to re-schedule your appointment should you arrive 10 or more minutes late.  We strive to give you quality time with our providers, and arriving late affects you and other patients whose appointments are after yours.  Also, if you no show three or more times for appointments you may be dismissed from the clinic at the providers discretion.     Again, thank you for choosing [facility].  Our hope is that these requests will decrease the amount of time that you wait before being seen by our physicians.  _____________________________________________________________  Should you have questions after your visit to [facility], please contact our office at [phone] and follow the prompts.  Our office hours are 8:00 a.m. and 4:30 p.m. Monday - Friday.  Please note that voicemails left after 4:00 p.m. may not be returned until the following business day.  We are closed weekends and major holidays.  You do have access to a nurse 24-7, just call the main number to the clinic [phone] and do not press any options, hold on the line and a nurse will answer the phone.    For prescription refill requests, have your  pharmacy contact our office and allow 72 hours.

## 2024-01-13 NOTE — Therapy (Signed)
 OUTPATIENT PHYSICAL THERAPY VESTIBULAR TREATMENT     Patient Name: Michael Johnston MRN: [id number] DOB:1932-04-23, 89 y.o., male Today's Date: 01/13/2024  END OF SESSION:  PT End of Session - 01/13/24 1152     Visit Number 3    Number of Visits 6    Date for PT Re-Evaluation 01/23/24    Authorization Type [employer] Medicare    Progress Note Due on Visit 6    PT Start Time 1150    PT Stop Time 1230    PT Time Calculation (min) 40 min    Activity Tolerance Patient tolerated treatment well;Other (comment)   increased dizziness througout session   Behavior During Therapy [facility] for tasks assessed/performed              Past Medical History:  Diagnosis Date   Arthritis    "right shoulder" (05/24/2015)   CHEST PAIN    Chronic bronchitis (HCC)    "get it ~ q yr" (05/24/2015)   COLONIC POLYPS, HX OF    COPD (chronic obstructive pulmonary disease) (HCC)    "dx'd but I don't take RX for it" (05/24/2015)   Coronary artery disease    Cystic kidney disease    GERD (gastroesophageal reflux disease)    History of hiatal hernia    Iron deficiency anemia 01/13/2024   MI (myocardial infarction) (HCC) 09/09/2010   Mixed hyperlipidemia    Skin cancer    "top of my head only" (05/24/2015)   Sleep apnea    Syncope and collapse ~ 2013; 05/24/2015   Past Surgical History:  Procedure Laterality Date   CARDIAC CATHETERIZATION  09/2010   CHOLECYSTECTOMY OPEN  1998   COLECTOMY  1998   "partial"   CORONARY ANGIOPLASTY     CORONARY ARTERY BYPASS GRAFT  09/2010   Median sternotomy for coronary artery bypass grafting x3  (left internal mammary artery to distal left anterior  descending  coronary artery, saphenous vein graft to first diagonal branch,  saphenous vein graft to first  obtuse marginal branch, endoscopic  saphenous vein harvest from right thigh). SURGEON:  Mendel Stain.  Alva Jewels, MD  ASSISTANT:  Lindi Revering, PA-C  ANESTHESIA:  Ernest Head, MD    LEFT HEART CATH AND CORS/GRAFTS ANGIOGRAPHY  N/A 08/22/2017   Procedure: LEFT HEART CATH AND CORS/GRAFTS ANGIOGRAPHY;  Surgeon: [place], Peter M, MD;  Location: [facility] INVASIVE CV LAB;  Service: Cardiovascular;  Laterality: N/A;   SKIN CANCER EXCISION     "top of my head"   Patient Active Problem List   Diagnosis Date Noted   Iron deficiency anemia 01/13/2024   Pulmonary embolus (HCC) 02/08/2022   Combined forms of age-related cataract of left eye 11/10/2018   Combined forms of age-related cataract of right eye 11/03/2018   Intraoperative floppy iris syndrome (IFIS) 11/03/2018   Urine stream spraying 02/23/2018   Flank pain 02/22/2018   Chest pain 08/21/2017   Unstable angina (HCC) 08/21/2017   Renal cyst 12/20/2016   B12 deficiency 11/08/2016   Type 2 diabetes mellitus with neurological complications (HCC) 11/08/2016   Syncope and collapse 05/24/2015   Benign prostatic hyperplasia with urinary obstruction 12/12/2014   Carotid artery stenosis 05/22/2012   CAD (coronary artery disease) 05/14/2012   ED (erectile dysfunction) of organic origin 08/19/2011   FH: CABG (coronary artery bypass surgery) 01/28/2011   Mixed hyperlipidemia 10/23/2010   GERD (gastroesophageal reflux disease) 10/22/2010   History of colonic polyps 06/14/2010    PCP: Alto Atta, NP REFERRING PROVIDER:  Nafziger, Randel Buss, NP  REFERRING DIAG:  H81.13 (ICD-10-CM) - Benign paroxysmal positional vertigo due to bilateral vestibular disorder    THERAPY DIAG:  Dizziness and giddiness  Difficulty in walking, not elsewhere classified  Impairment of balance  Unsteadiness on feet  Muscle weakness (generalized)  Other low back pain  ONSET DATE: 5 years or so  Rationale for Evaluation and Treatment: Rehabilitation  SUBJECTIVE:   SUBJECTIVE STATEMENT: Patient with no new falls since last treatment.  He was able to go play golf over the weekend; reports compliance with HEP  Reports some spinning on occasion over the years but now has affected his  walking; he staggers and walks like a "drunk man".  Worse in the mornings.  Over a year since he has had any spinning; now just feels off balance; more off balance with looking down or looking up or moving head quickly.   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 Yes: NPRS scale: 0/10 currently Pain location: stomach Pain description: right abdominal area Aggravating factors: unknown Relieving factors: unknown  PRECAUTIONS: Fall  WEIGHT BEARING RESTRICTIONS: No  FALLS: Has patient fallen in last 6 months? Yes. Number of falls 3  LIVING ENVIRONMENT: Lives with: lives with their spouse Lives in: House/apartment Stairs:  ramped entry Has following equipment at home: Single point cane and Walker - 2 wheeled  PLOF: Independent  PATIENT GOALS: get some relief from dizziness  OBJECTIVE:  Note: Objective measures were completed at Evaluation unless otherwise noted.  DIAGNOSTIC FINDINGS:   COGNITION: Overall cognitive status: Within functional limits for tasks assessed   SENSATION: Tingling left foot  POSTURE:  rounded shoulders, forward head, and forward flexed trunk  Cervical ROM:  stifness noted with extension and right rotation especially  Active AROM (deg) eval  Flexion 30  Extension 35, dizziness  Right lateral flexion 35, tight  Left lateral flexion 30  Right rotation 50,tightness  Left rotation 55  (Blank rows = not tested)  STRENGTH: not tested  LOWER EXTREMITY MMT:   MMT Right eval Left eval  Hip flexion    Hip abduction    Hip adduction    Hip internal rotation    Hip external rotation    Knee flexion    Knee extension    Ankle dorsiflexion    Ankle plantarflexion    Ankle inversion    Ankle eversion    (Blank rows = not tested)  BED MOBILITY:  Not tested  TRANSFERS: Assistive device utilized: None   Sit to stand: Modified independence Stand to sit: Modified independence Chair to chair:  not tested Floor:  not tested    GAIT: Gait pattern: decreased ankle dorsiflexion- Right, decreased ankle dorsiflexion- Left, trunk flexed, wide BOS, and poor foot clearance- Left Distance walked: 50 ft in clinic Assistive device utilized: None Level of assistance: Modified independence and SBA Comments: foot slap with ambulation  FUNCTIONAL TESTS:  SLS right foot 1"; left unable  PATIENT SURVEYS:  DHI 30/100  VESTIBULAR ASSESSMENT:  GENERAL OBSERVATION: patient ambulates with wide BOS and forward flexed trunk; hooded eyes   SYMPTOM BEHAVIOR:  Subjective history: see above  Non-Vestibular symptoms: changes in hearing and changes in vision  Type of dizziness: Blurred Vision, Imbalance (Disequilibrium), and Unsteady with head/body turns  Frequency: frequently throughout the day  Duration: short lasting   Aggravating factors: Induced  by motion: looking up at the ceiling, turning head quickly, sitting in a moving car, and activity in general, Worse in the morning, Worse in the dark, Worse outside or in busy environment, and Moving eyes  Relieving factors: head stationary, slow movements, and avoid busy/distracting environments  Progression of symptoms: unchanged  OCULOMOTOR EXAM:  Ocular Alignment: normal  Ocular ROM: No Limitations  Spontaneous Nystagmus: absent  Gaze-Induced Nystagmus: absent  Smooth Pursuits: saccades  Saccades: extra eye movements  Convergence/Divergence: 3 cm   VESTIBULAR - OCULAR REFLEX:   Slow VOR: Positive Bilaterally  VOR Cancellation: Normal  Head-Impulse Test: HIT Right: positive HIT Left: positive  Dynamic Visual Acuity:  not assessed   POSITIONAL TESTING: Other: not tested at eval  MOTION SENSITIVITY:  Motion Sensitivity Quotient Intensity: 0 = none, 1 = Lightheaded, 2 = Mild, 3 = Moderate, 4 = Severe, 5 = Vomiting  Intensity  1. Sitting to supine    2. Supine to L side   3. Supine to R side   4. Supine to sitting   5. L Hallpike-Dix   6. Up from L    7. R Hallpike-Dix   8. Up from R    9. Sitting, head tipped to L knee   10. Head up from L knee   11. Sitting, head tipped to R knee   12. Head up from R knee   13. Sitting head turns x5 4  14.Sitting head nods x5 3  15. In stance, 180 turn to L    16. In stance, 180 turn to R     OTHOSTATICS: not done  FUNCTIONAL GAIT: 5 times sit to stand: next visit SLS  right foot 1" and left unable                                                                                                                             TREATMENT DATE:  01/13/24 Seated  Gaze stabilization head turns and nods 2 x 10 each Gaze stabilization with 2 targets x 10 each Standing gaze stabilization x 10 with 1 target head turns and nods Standing: Tandem stance modified x 30" each Calf stretch 3 x 20"  01/06/2024  Assessment: Cervical ROM [employer], negative both sides (dizziness noted upon sitting up)   Neuromuscular Re-education: -Standing Balance Normal bos, EO, 30 seconds -Standing Balance Normal bos, EC, 30 seconds (hip strategy used heavily -4 inch step ups, 2 sets of 5 reps, inconsistent foot placement, foot drop noted bilaterally, CGA needed -VOR vertical, 30 second bouts, 2 reps (dizziness noted), pt cued for increased speed for increased symptom reproduction -VOR horizontal, 30 second bouts, 2 reps (dizziness noted), pt cued for decreased ROM so both eye can stay focused on target    12/12/23 physical therapy evaluation and HEP instruction   Canalith Repositioning:  Comment: negative bilaterally, 01/06/24 Gaze Adaptation:  x1 Viewing Horizontal: Position: sitting, Time: 10 reps, Reps: 10, and Comment: 4/5 dizziness  and x1 Viewing Vertical:  Position: sitting , Reps: 10, and Comment: 3/5 dizziness Habituation:  Other: not today Other: not today  PATIENT EDUCATION: Education details:  Patient educated on exam findings, POC, scope of PT, HEP, and what to expect next visit. Person educated: Patient Education method: Explanation, Demonstration, and Handouts Education comprehension: verbalized understanding, returned demonstration, verbal cues required, and tactile cues required  HOME EXERCISE PROGRAM: Access Code: [email or web address] URL: [email or web address] Date: 01/13/2024 Prepared by: AP - Rehab  Exercises -- Standing Gaze Stabilization with Two Near Targets and Head Rotation  - 5 x daily - 7 x weekly - 3 sets - 10 reps - Narrow Stance Gaze Stabilization with Two Near Targets and Head Rotation  - 5 x daily - 7 x weekly - 3 sets - 10 reps - Gastroc Stretch on Wall  - 2 x daily - 7 x weekly - 1 sets - 5 reps - 20 sec hold - tandem stance balance; try not to hold on  - 2 x daily - 7 x weekly - 1 sets - 3 reps - 30 sec hold  Access Code: [email or web address] URL: [email or web address] Date: 12/12/2023 Prepared by: AP - Rehab  Exercises - Seated Gaze Stabilization with Head Nod  - 5 x daily - 7 x weekly - 60 hold - Seated Gaze Stabilization with Head Rotation  - 5 x daily - 7 x weekly - 60 hold GOALS: Goals reviewed with patient? No  SHORT TERM GOALS: Target date: 01/02/24  patient will be independent with initial HEP  Baseline: Goal status: INITIAL  2.  Patient will report 50% improvement overall  Baseline:  Goal status: INITIAL  3.  Patient will be able to stand on each leg SLS x 4" to demonstrate improved functional balance.  Baseline:  Goal status: INITIAL   LONG TERM GOALS: Target date: 01/23/2024  Patient will be independent in self management strategies to improve quality of life and functional outcomes.  Baseline:  Goal status: INITIAL  2.  Patient will report 75% improvement overall  Baseline:  Goal status: INITIAL  3.  Patient will improve DHI score by 10 points to demonstrate decreased dizziness with functional  activity Baseline: 30/100 Goal status: INITIAL  4.  Patient will remain free of falls Baseline:  Goal status: INITIAL   ASSESSMENT:  CLINICAL IMPRESSION: Patient with noted "foot slap" with ambulation and decreased stance left leg versus right.  Discussed use of a dorsiflexion assist with patient and his son and he has had an AFO in the past and did not wear it because it rubbed his shin bone.  Added calf stretching to treatment due to limited dorsiflexion available; added balance activity and increased challenge with gaze stabilization to using 2 targets.  Updated HEP.  Patient needed CGA and occasional min A to recover for all standing activity today for safety.  Patient would continue to benefit from skilled physical therapy for decreased dizziness, increased endurance with ambulation, increased LE strength, and improved balance for improved quality of life, improved independence with gait training and continued progress towards therapy goals.   Patient is a 89 y.o. male who was seen today for physical therapy evaluation and treatment for Benign paroxysmal positional vertigo due to bilateral vestibular disorder. Patient demonstrates impaired VOR, balance deficits and gait abnormalities which are negatively impacting patient ability to perform ADLs and functional mobility tasks. Patient will benefit from skilled physical therapy services to address these deficits to improve level of function with ADLs, functional mobility tasks,  and reduce risk for falls.   OBJECTIVE IMPAIRMENTS: Abnormal gait, decreased balance, decreased knowledge of condition, dizziness, impaired perceived functional ability, and pain.   ACTIVITY LIMITATIONS: carrying, lifting, bending, standing, stairs, transfers, and locomotion level  PARTICIPATION LIMITATIONS: cleaning, driving, and yard work  PERSONAL FACTORS:  HOH and impaired vision  are also affecting patient's functional outcome.   REHAB POTENTIAL:  Good  CLINICAL DECISION MAKING: Evolving/moderate complexity  EVALUATION COMPLEXITY: Moderate   PLAN:  PT FREQUENCY: 1x/week  PT DURATION: 6 weeks  PLANNED INTERVENTIONS: 97164- PT Re-evaluation, 97110-Therapeutic exercises, 97530- Therapeutic activity, 97112- Neuromuscular re-education, 97535- Self Care, 16109- Manual therapy, [phone]- Gait training, [phone]- Orthotic Fit/training, [phone]- Canalith repositioning, [id number]- Aquatic Therapy, [phone]- Splinting, Patient/Family education, Balance training, Stair training, Taping, Dry Needling, Joint mobilization, Joint manipulation, Spinal manipulation, Spinal mobilization, Scar mobilization, and DME instructions.   PLAN FOR NEXT SESSION: Progress VOR and balance training.   12:38 PM, 01/13/24 Barack Nicodemus Small Daryus Sowash MPT [facility] [facility] [place] [phone]

## 2024-01-14 ENCOUNTER — Inpatient Hospital Stay

## 2024-01-14 VITALS — BP 165/61 | HR 55 | Temp 97.6°F | Resp 16

## 2024-01-14 DIAGNOSIS — D696 Thrombocytopenia, unspecified: Secondary | ICD-10-CM | POA: Diagnosis not present

## 2024-01-14 DIAGNOSIS — R262 Difficulty in walking, not elsewhere classified: Secondary | ICD-10-CM | POA: Diagnosis not present

## 2024-01-14 DIAGNOSIS — Z86718 Personal history of other venous thrombosis and embolism: Secondary | ICD-10-CM | POA: Diagnosis not present

## 2024-01-14 DIAGNOSIS — R2681 Unsteadiness on feet: Secondary | ICD-10-CM | POA: Diagnosis not present

## 2024-01-14 DIAGNOSIS — I2510 Atherosclerotic heart disease of native coronary artery without angina pectoris: Secondary | ICD-10-CM | POA: Diagnosis not present

## 2024-01-14 DIAGNOSIS — D472 Monoclonal gammopathy: Secondary | ICD-10-CM | POA: Diagnosis not present

## 2024-01-14 DIAGNOSIS — Q619 Cystic kidney disease, unspecified: Secondary | ICD-10-CM | POA: Diagnosis not present

## 2024-01-14 DIAGNOSIS — M6281 Muscle weakness (generalized): Secondary | ICD-10-CM | POA: Diagnosis not present

## 2024-01-14 DIAGNOSIS — R2689 Other abnormalities of gait and mobility: Secondary | ICD-10-CM | POA: Diagnosis not present

## 2024-01-14 DIAGNOSIS — R42 Dizziness and giddiness: Secondary | ICD-10-CM | POA: Diagnosis not present

## 2024-01-14 DIAGNOSIS — M5459 Other low back pain: Secondary | ICD-10-CM | POA: Diagnosis not present

## 2024-01-14 DIAGNOSIS — Z86711 Personal history of pulmonary embolism: Secondary | ICD-10-CM | POA: Diagnosis not present

## 2024-01-14 DIAGNOSIS — D509 Iron deficiency anemia, unspecified: Secondary | ICD-10-CM | POA: Diagnosis not present

## 2024-01-14 DIAGNOSIS — Z7901 Long term (current) use of anticoagulants: Secondary | ICD-10-CM | POA: Diagnosis not present

## 2024-01-14 DIAGNOSIS — D508 Other iron deficiency anemias: Secondary | ICD-10-CM

## 2024-01-14 MED ORDER — ACETAMINOPHEN 325 MG PO TABS
650.0000 mg | ORAL_TABLET | Freq: Once | ORAL | Status: AC
  Administered 2024-01-14: 650 mg via ORAL
  Filled 2024-01-14: qty 2

## 2024-01-14 MED ORDER — CETIRIZINE HCL 10 MG/ML IV SOLN
5.0000 mg | Freq: Once | INTRAVENOUS | Status: AC
  Administered 2024-01-14: 5 mg via INTRAVENOUS
  Filled 2024-01-14: qty 1

## 2024-01-14 MED ORDER — SODIUM CHLORIDE 0.9 % IV SOLN: INTRAVENOUS | Status: DC

## 2024-01-14 MED ORDER — SODIUM CHLORIDE 0.9 % IV SOLN
510.0000 mg | Freq: Once | INTRAVENOUS | Status: AC
  Administered 2024-01-14: 510 mg via INTRAVENOUS
  Filled 2024-01-14: qty 510

## 2024-01-14 NOTE — Progress Notes (Signed)
 Patient presents today for iron infusion. Patient is in satisfactory condition with no new complaints voiced.  Vital signs are stable.  We will proceed with infusion per provider orders.    Peripheral IV started with good blood return pre and post infusion.  Feraheme 510 mg given today per MD orders. Tolerated infusion without adverse affects. Vital signs stable. No complaints at this time. Discharged from clinic ambulatory in stable condition. Alert and oriented x 3. F/U with [facility] as scheduled.

## 2024-01-18 ENCOUNTER — Encounter: Payer: Self-pay | Admitting: Physician Assistant

## 2024-01-20 ENCOUNTER — Encounter (HOSPITAL_COMMUNITY)

## 2024-01-21 ENCOUNTER — Inpatient Hospital Stay

## 2024-01-21 VITALS — BP 145/73 | HR 50 | Temp 97.8°F | Resp 18

## 2024-01-21 DIAGNOSIS — Z7901 Long term (current) use of anticoagulants: Secondary | ICD-10-CM | POA: Diagnosis not present

## 2024-01-21 DIAGNOSIS — Z86711 Personal history of pulmonary embolism: Secondary | ICD-10-CM | POA: Diagnosis not present

## 2024-01-21 DIAGNOSIS — M5459 Other low back pain: Secondary | ICD-10-CM | POA: Diagnosis not present

## 2024-01-21 DIAGNOSIS — D696 Thrombocytopenia, unspecified: Secondary | ICD-10-CM | POA: Diagnosis not present

## 2024-01-21 DIAGNOSIS — I2510 Atherosclerotic heart disease of native coronary artery without angina pectoris: Secondary | ICD-10-CM | POA: Diagnosis not present

## 2024-01-21 DIAGNOSIS — R2681 Unsteadiness on feet: Secondary | ICD-10-CM | POA: Diagnosis not present

## 2024-01-21 DIAGNOSIS — R262 Difficulty in walking, not elsewhere classified: Secondary | ICD-10-CM | POA: Diagnosis not present

## 2024-01-21 DIAGNOSIS — Z86718 Personal history of other venous thrombosis and embolism: Secondary | ICD-10-CM | POA: Diagnosis not present

## 2024-01-21 DIAGNOSIS — M6281 Muscle weakness (generalized): Secondary | ICD-10-CM | POA: Diagnosis not present

## 2024-01-21 DIAGNOSIS — D509 Iron deficiency anemia, unspecified: Secondary | ICD-10-CM | POA: Diagnosis not present

## 2024-01-21 DIAGNOSIS — R2689 Other abnormalities of gait and mobility: Secondary | ICD-10-CM | POA: Diagnosis not present

## 2024-01-21 DIAGNOSIS — Q619 Cystic kidney disease, unspecified: Secondary | ICD-10-CM | POA: Diagnosis not present

## 2024-01-21 DIAGNOSIS — R42 Dizziness and giddiness: Secondary | ICD-10-CM | POA: Diagnosis not present

## 2024-01-21 DIAGNOSIS — D472 Monoclonal gammopathy: Secondary | ICD-10-CM | POA: Diagnosis not present

## 2024-01-21 DIAGNOSIS — D508 Other iron deficiency anemias: Secondary | ICD-10-CM

## 2024-01-21 MED ORDER — SODIUM CHLORIDE 0.9 % IV SOLN
510.0000 mg | Freq: Once | INTRAVENOUS | Status: AC
  Administered 2024-01-21: 510 mg via INTRAVENOUS
  Filled 2024-01-21: qty 510

## 2024-01-21 MED ORDER — ACETAMINOPHEN 325 MG PO TABS
650.0000 mg | ORAL_TABLET | Freq: Once | ORAL | Status: AC
  Administered 2024-01-21: 650 mg via ORAL
  Filled 2024-01-21: qty 2

## 2024-01-21 MED ORDER — CETIRIZINE HCL 10 MG/ML IV SOLN
5.0000 mg | Freq: Once | INTRAVENOUS | Status: AC
Start: 2024-01-21 — End: 2024-01-21
  Administered 2024-01-21: 5 mg via INTRAVENOUS
  Filled 2024-01-21: qty 1

## 2024-01-21 MED ORDER — SODIUM CHLORIDE 0.9 % IV SOLN
INTRAVENOUS | Status: DC
Start: 2024-01-21 — End: 2024-01-21

## 2024-01-21 NOTE — Patient Instructions (Signed)
 [facility] [place] - A DEPT OF [facility]  Discharge Instructions: Thank you for choosing [facility] to provide your oncology and hematology care.  If you have a lab appointment with the Cancer Center - please note that after April 8th, 2024, all labs will be drawn in the cancer center.  You do not have to check in or register with the main entrance as you have in the past but will complete your check-in in the cancer center.  Wear comfortable clothing and clothing appropriate for easy access to any Portacath or PICC line.   We strive to give you quality time with your provider. You may need to reschedule your appointment if you arrive late (15 or more minutes).  Arriving late affects you and other patients whose appointments are after yours.  Also, if you miss three or more appointments without notifying the office, you may be dismissed from the clinic at the provider's discretion.      For prescription refill requests, have your pharmacy contact our office and allow 72 hours for refills to be completed.    Today you received the following:  Feraheme.  Ferumoxytol Injection What is this medication? FERUMOXYTOL (FER ue MOX i tol) treats low levels of iron in your body (iron deficiency anemia). Iron is a mineral that plays an important role in making red blood cells, which carry oxygen from your lungs to the rest of your body. This medicine may be used for other purposes; ask your health care provider or pharmacist if you have questions. COMMON BRAND NAME(S): Feraheme What should I tell my care team before I take this medication? They need to know if you have any of these conditions: Anemia not caused by low iron levels High levels of iron in the blood Magnetic resonance imaging (MRI) test scheduled An unusual or allergic reaction to iron, other medications, foods, dyes, or preservatives Pregnant or trying to get pregnant Breastfeeding How should I  use this medication? This medication is injected into a vein. It is given by your care team in a hospital or clinic setting. Talk to your care team the use of this medication in children. Special care may be needed. Overdosage: If you think you have taken too much of this medicine contact a poison control center or emergency room at once. NOTE: This medicine is only for you. Do not share this medicine with others. What if I miss a dose? It is important not to miss your dose. Call your care team if you are unable to keep an appointment. What may interact with this medication? Other iron products This list may not describe all possible interactions. Give your health care provider a list of all the medicines, herbs, non-prescription drugs, or dietary supplements you use. Also tell them if you smoke, drink alcohol, or use illegal drugs. Some items may interact with your medicine. What should I watch for while using this medication? Visit your care team for regular checks on your progress. Tell your care team if your symptoms do not start to get better or if they get worse. You may need blood work done while you are taking this medication. You may need to eat more foods that contain iron. Talk to your care team. Foods that contain iron include whole grains or cereals, dried fruits, beans, peas, leafy green vegetables, and organ meats (liver, kidney). What side effects may I notice from receiving this medication? Side effects that you should  report to your care team as soon as possible: Allergic reactions--skin rash, itching, hives, swelling of the face, lips, tongue, or throat Low blood pressure--dizziness, feeling faint or lightheaded, blurry vision Shortness of breath Side effects that usually do not require medical attention (report to your care team if they continue or are bothersome): Flushing Headache Joint pain Muscle pain Nausea Pain, redness, or irritation at injection site This list  may not describe all possible side effects. Call your doctor for medical advice about side effects. You may report side effects to FDA at 1-800-FDA-1088. Where should I keep my medication? This medication is given in a hospital or clinic. It will not be stored at home. NOTE: This sheet is a summary. It may not cover all possible information. If you have questions about this medicine, talk to your doctor, pharmacist, or health care provider.  2024 Elsevier/Gold Standard (2023-04-16 00:00:00)    To help prevent nausea and vomiting after your treatment, we encourage you to take your nausea medication as directed.  BELOW ARE SYMPTOMS THAT SHOULD BE REPORTED IMMEDIATELY: *FEVER GREATER THAN 100.4 F (38 C) OR HIGHER *CHILLS OR SWEATING *NAUSEA AND VOMITING THAT IS NOT CONTROLLED WITH YOUR NAUSEA MEDICATION *UNUSUAL SHORTNESS OF BREATH *UNUSUAL BRUISING OR BLEEDING *URINARY PROBLEMS (pain or burning when urinating, or frequent urination) *BOWEL PROBLEMS (unusual diarrhea, constipation, pain near the anus) TENDERNESS IN MOUTH AND THROAT WITH OR WITHOUT PRESENCE OF ULCERS (sore throat, sores in mouth, or a toothache) UNUSUAL RASH, SWELLING OR PAIN  UNUSUAL VAGINAL DISCHARGE OR ITCHING   Items with * indicate a potential emergency and should be followed up as soon as possible or go to the Emergency Department if any problems should occur.  Please show the CHEMOTHERAPY ALERT CARD or IMMUNOTHERAPY ALERT CARD at check-in to the Emergency Department and triage nurse.  Should you have questions after your visit or need to cancel or reschedule your appointment, please contact [facility] [place] - A DEPT OF [facility] [phone]  and follow the prompts.  Office hours are 8:00 a.m. to 4:30 p.m. Monday - Friday. Please note that voicemails left after 4:00 p.m. may not be returned until the following business day.  We are closed weekends and major holidays. You have access to a  nurse at all times for urgent questions. Please call the main number to the clinic [phone] and follow the prompts.  For any non-urgent questions, you may also contact your provider using MyChart. We now offer e-Visits for anyone 39 and older to request care online for non-urgent symptoms. For details visit [email or web address].   Also download the MyChart app! Go to the app store, search "MyChart", open the app, select [place], and log in with your MyChart username and password.

## 2024-01-21 NOTE — Progress Notes (Signed)
 Patient presents today for iron infusion.  Patient is in satisfactory condition with no new complaints voiced.  Vital signs are stable.  IV placed in R arm.  IV flushed well with good blood return noted.  We will proceed with infusion per provider orders.    Patient tolerated infusion well with no complaints voiced.  Patient left ambulatory in stable condition.  Vital signs stable at discharge.  Follow up as scheduled.

## 2024-01-27 ENCOUNTER — Encounter (HOSPITAL_COMMUNITY)

## 2024-01-29 ENCOUNTER — Ambulatory Visit: Admitting: Family Medicine

## 2024-01-29 ENCOUNTER — Encounter: Payer: Self-pay | Admitting: Family Medicine

## 2024-01-29 VITALS — BP 128/60 | HR 50

## 2024-01-29 DIAGNOSIS — Z0000 Encounter for general adult medical examination without abnormal findings: Secondary | ICD-10-CM

## 2024-01-29 NOTE — Progress Notes (Unsigned)
 PATIENT CHECK-IN and HEALTH RISK ASSESSMENT QUESTIONNAIRE:  -completed by phone/video for upcoming Medicare Preventive Visit  Pre-Visit Check-in: 1)Vitals (height, wt, BP, etc) - record in vitals section for visit on day of visit Request home vitals (wt, BP, etc.) and enter into vitals, THEN update Vital Signs SmartPhrase below at the top of the HPI. See below.  2)Review and Update Medications, Allergies PMH, Surgeries, Social history in Epic 3)Hospitalizations in the last year with date/reason? no  4)Review and Update Care Team (patient's specialists) in Epic 5) Complete PHQ9 in Epic  6) Complete Fall Screening in Epic 7)Review all Health Maintenance Due and order under PCP if not done.  Medicare Wellness Patient Questionnaire:  Answer theses question about your habits: How often do you have a drink containing alcohol?no How many drinks containing alcohol do you have on a typical day when you are drinking?n/a How often do you have six or more drinks on one occasion? N/a Have you ever smoked?Yes  Quit date if applicable? 69 years   How many packs a day do/did you smoke? 1-2 Do you use smokeless tobacco? no Do you use an illicit drugs?no On average, how many days per week do you engage in moderate to strenuous exercise (like a brisk walk)?gets outside takes care of chickens, mows the grass, plants garden Are you sexually active? Yes Number of partners? 1 Typical breakfast: Varies  Typical lunch: Varies - usually a snack, farm fresh eggs, grits Typical dinner: Varies, protein and veggies, or a sandwich, sometimes leftovers Typical snacks: N/A   Beverages:  Coffee, water   Answer theses question about your everyday activities: Can you perform most household chores? Yes  Are you deaf or have significant trouble hearing?Yes  Do you feel that you have a problem with memory? No Do you feel safe at home? Yes  Last dentist visit? 3 weeks ago 8. Do you have any difficulty performing your  everyday activities? no Are you having any difficulty walking, taking medications on your own, and or difficulty managing daily home needs?no Do you have difficulty walking or climbing stairs? Yes  Do you have difficulty dressing or bathing? No Do you have difficulty doing errands alone such as visiting a doctor's office or shopping? no Do you currently have any difficulty preparing food and eating? no Do you currently have any difficulty using the toilet?no Do you have any difficulty managing your finances?no Do you have any difficulties with housekeeping of managing your housekeeping?no   Do you have Advanced Directives in place (Living Will, Healthcare Power or Attorney)?  Yes    Last eye Exam and location? 1 year ago, [facility] in [place]   Do you currently use prescribed or non-prescribed narcotic or opioid pain medications? no  Do you have a history or close family history of breast, ovarian, tubal or peritoneal cancer or a family member with BRCA (breast cancer susceptibility 1 and 2) gene mutations? no    Nurse/Assistant Credentials/time stamp: Mg 4:12     ----------------------------------------------------------------------------------------------------------------------------------------------------------------------------------------------------------------------  Because this visit was a virtual/telehealth visit, some criteria may be missing or patient reported. Any vitals not documented were not able to be obtained and vitals that have been documented are patient reported.    MEDICARE ANNUAL PREVENTIVE VISIT WITH PROVIDER: (Welcome to Medicare, initial annual wellness or annual wellness exam)  Virtual Visit via Phone Note  I connected with TABIAS SWAYZE on 02/05/24 by phone and verified that I am speaking with the correct person using two identifiers.He preferred  phone visit.   Location patient: home Location provider:work or home office Persons  participating in the virtual visit: patient, provider  Concerns and/or follow up today: doing PT for some balance issues.   See HM section in Epic for other details of completed HM.    ROS: negative for report of fevers, unintentional weight loss, vision changes, vision loss, hearing loss or change, chest pain, sob, hemoptysis, melena, hematochezia, hematuria, falls, bleeding or bruising, thoughts of suicide or self harm, memory loss  Patient-completed extensive health risk assessment - reviewed and discussed with the patient: See Health Risk Assessment completed with patient prior to the visit either above or in recent phone note. This was reviewed in detailed with the patient today and appropriate recommendations, orders and referrals were placed as needed per Summary below and patient instructions.   Review of Medical History: -PMH, PSH, Family History and current specialty and care providers reviewed and updated and listed below   Patient Care Team: Alto Atta, NP as PCP - General (Family Medicine) Loyde Rule, MD as PCP - Cardiology (Cardiology) Alver Austin, [facility] (Inactive) as Pharmacist (Pharmacist)   Past Medical History:  Diagnosis Date   Arthritis    "right shoulder" (05/24/2015)   CHEST PAIN    Chronic bronchitis (HCC)    "get it ~ q yr" (05/24/2015)   COLONIC POLYPS, HX OF    COPD (chronic obstructive pulmonary disease) (HCC)    "dx'd but I don't take RX for it" (05/24/2015)   Coronary artery disease    Cystic kidney disease    GERD (gastroesophageal reflux disease)    History of hiatal hernia    Iron deficiency anemia 01/13/2024   MI (myocardial infarction) (HCC) 09/09/2010   Mixed hyperlipidemia    Skin cancer    "top of my head only" (05/24/2015)   Sleep apnea    Syncope and collapse ~ 2013; 05/24/2015    Past Surgical History:  Procedure Laterality Date   CARDIAC CATHETERIZATION  09/2010   CHOLECYSTECTOMY OPEN  1998   COLECTOMY  1998   "partial"    CORONARY ANGIOPLASTY     CORONARY ARTERY BYPASS GRAFT  09/2010   Median sternotomy for coronary artery bypass grafting x3  (left internal mammary artery to distal left anterior  descending  coronary artery, saphenous vein graft to first diagonal branch,  saphenous vein graft to first  obtuse marginal branch, endoscopic  saphenous vein harvest from right thigh). SURGEON:  Mendel Stain.  Alva Jewels, MD  ASSISTANT:  Lindi Revering, PA-C  ANESTHESIA:  Ernest Head, MD    LEFT HEART CATH AND CORS/GRAFTS ANGIOGRAPHY N/A 08/22/2017   Procedure: LEFT HEART CATH AND CORS/GRAFTS ANGIOGRAPHY;  Surgeon: [place], Peter M, MD;  Location: [facility] INVASIVE CV LAB;  Service: Cardiovascular;  Laterality: N/A;   SKIN CANCER EXCISION     "top of my head"    Social History   Socioeconomic History   Marital status: Married    Spouse name: Not on file   Number of children: Not on file   Years of education: Not on file   Highest education level: Not on file  Occupational History   Not on file  Tobacco Use   Smoking status: Former    Current packs/day: 0.00    Average packs/day: 1.5 packs/day for 3.0 years (4.5 ttl pk-yrs)    Types: Cigarettes    Start date: 12/08/1948    Quit date: 12/09/1951    Years since quitting: 72.2  Smokeless tobacco: Never  Vaping Use   Vaping status: Never Used  Substance and Sexual Activity   Alcohol use: Not Currently    Comment: "no alcohol since 1953"   Drug use: No   Sexual activity: Not on file  Other Topics Concern   Not on file  Social History Narrative   Retired    Is a Education officer, 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 [place]    Social Drivers of [employer]: Low Risk  (01/29/2024)   Overall Financial Resource Strain (CARDIA)    Difficulty of Paying Living Expenses: Not hard at all  Food Insecurity: No Food Insecurity (01/29/2024)   Hunger Vital Sign    Worried About Running Out of Food in the Last Year: Never true    Ran Out of Food in the Last Year: Never true   Transportation Needs: No Transportation Needs (01/29/2024)   PRAPARE - Administrator, Civil Service (Medical): No    Lack of Transportation (Non-Medical): No  Physical Activity: Patient Unable To Answer (01/29/2024)   Exercise Vital Sign    Days of Exercise per Week: Patient unable to answer    Minutes of Exercise per Session: Patient unable to answer  Stress: No Stress Concern Present (01/29/2024)   [employer] of Occupational Health - Occupational Stress Questionnaire    Feeling of Stress : Not at all  Social Connections: Unknown (01/29/2024)   Social Connection and Isolation Panel [NHANES]    Frequency of Communication with Friends and Family: Patient unable to answer    Frequency of Social Gatherings with Friends and Family: Patient unable to answer    Attends Religious Services: More than 4 times per year    Active Member of [employer] or Organizations: Yes    Attends Engineer, structural: More than 4 times per year    Marital Status: Married  Catering manager Violence: Not At Risk (01/29/2024)   Humiliation, Afraid, Rape, and Kick questionnaire    Fear of Current or Ex-Partner: No    Emotionally Abused: No    Physically Abused: No    Sexually Abused: No    Family History  Problem Relation Age of Onset   Lung cancer Sister    Heart attack Father     Current Outpatient Medications on File Prior to Visit  Medication Sig Dispense Refill   aspirin  81 MG tablet Take 81 mg by mouth daily.     atorvastatin  (LIPITOR) 20 MG tablet TAKE 1 TABLET BY MOUTH EVERY DAY 90 tablet 3   cyanocobalamin  (VITAMIN B12) 1000 MCG/ML injection INJECT 1 ML INTRAMUSCULARLY EVERY OTHER WEEK 3 mL 6   diclofenac  Sodium (VOLTAREN ) 1 % GEL Apply 2 g topically 4 (four) times daily as needed (For pain). 150 g 2   ELIQUIS  5 MG TABS tablet TAKE 1 TABLET BY MOUTH TWICE A DAY 180 tablet 0   EPINEPHrine  0.3 mg/0.3 mL IJ SOAJ injection Inject 0.3 mg into the muscle once as needed for  anaphylaxis. 1 each 0   Ferrous Gluconate-C-Folic Acid  (IRON-C PO) Take by mouth.     ketoconazole  (NIZORAL ) 2 % shampoo USE TWICE WEEKLY 120 mL 3   metFORMIN  (GLUCOPHAGE ) 500 MG tablet TAKE 1 TABLET BY MOUTH EVERY DAY WITH BREAKFAST 90 tablet 1   metoprolol  tartrate (LOPRESSOR ) 25 MG tablet TAKE 1 TABLET BY MOUTH TWICE A DAY 180 tablet 3   nitroGLYCERIN  (NITROSTAT ) 0.4 MG SL tablet Place 1 tablet (0.4 mg total) under the tongue every  5 (five) minutes as needed for chest pain (3 doses max). 25 tablet 1   SYRINGE-NEEDLE, DISP, 3 ML (B-D 3CC LUER-LOK SYR 25GX1") 25G X 1" 3 ML MISC USE AS DIRECTED TO INJECT B12 3 each 3   No current facility-administered medications on file prior to visit.    Allergies  Allergen Reactions   Levaquin  [Levofloxacin  In D5w] Swelling and Other (See Comments)    Eyes swollen   Other Anaphylaxis and Other (See Comments)    Bee sting   Ivp Dye [Iodinated Contrast Media] Other (See Comments)    Patient reports seizure   Metformin  And Related Nausea Only       Physical Exam Vitals requested from patient and listed below if patient had equipment and was able to obtain at home for this virtual visit: Vitals:   01/29/24 1559  BP: 128/60  Pulse: (!) 50   Estimated body mass index is 25.18 kg/m as calculated from the following:   Height as of 01/13/24: 5' 9.5" (1.765 m).   Weight as of 01/13/24: 173 lb (78.5 kg).  EKG (optional): deferred due to virtual visit  GENERAL: alert, oriented, no acute distress detected, full vision exam deferred due to pandemic and/or virtual encounter  PSYCH/NEURO: pleasant and cooperative, no obvious depression or anxiety, speech and thought processing grossly intact, Cognitive function grossly intact  Flowsheet Row Clinical Support from 01/29/2024 in [facility] [place]  PHQ-9 Total Score 1           01/29/2024    4:00 PM 11/26/2023   10:28 AM 10/17/2022   12:38 PM 08/21/2022    8:24 AM 10/29/2021     3:14 PM  Depression screen PHQ 2/9  Decreased Interest 0 0 0 0 0  Down, Depressed, Hopeless 0 0 0 0 0  PHQ - 2 Score 0 0 0 0 0  Altered sleeping 0  0  0  Tired, decreased energy 1  1    Change in appetite 0  0  0  Feeling bad or failure about yourself  0  0  0  Trouble concentrating 0  0  0  Moving slowly or fidgety/restless 0  0  0  Suicidal thoughts 0  0  0  PHQ-9 Score 1  1  0  Difficult doing work/chores Not difficult at all  Not difficult at all         08/21/2022    8:21 AM 09/18/2022   10:50 AM 10/17/2022   12:37 PM 11/26/2023   10:26 AM 01/29/2024    4:02 PM  Fall Risk  Falls in the past year? 1  1 1 1   Was there an injury with Fall? 0  1 0 0  Fall Risk Category Calculator 2  3 1 2   Fall Risk Category (Retired) Moderate      (RETIRED) Patient Fall Risk Level Moderate fall risk Low fall risk     Patient at Risk for Falls Due to History of fall(s)   No Fall Risks No Fall Risks  Fall risk Follow up Falls evaluation completed   Falls evaluation completed Falls evaluation completed     SUMMARY AND PLAN:  Medicare annual wellness visit, subsequent   Discussed applicable health maintenance/preventive health measures and advised and referred or ordered per patient preferences: -he agrees  to have eye report sent to [facility] when does -discussed vaccines due recs/risks and that he can get at the pharmacy if wishes to do.  Health Maintenance  Topic Date Due   OPHTHALMOLOGY EXAM  11/05/2022   DTaP/Tdap/Td (3 - Tdap) 09/15/2023   COVID-19 Vaccine (1) 11/03/2027 (Originally 06/30/1937)   Zoster Vaccines- Shingrix (1 of 2) 01/15/2028 (Originally 06/29/1951)   INFLUENZA VACCINE  04/09/2024   HEMOGLOBIN A1C  05/28/2024   FOOT EXAM  08/25/2024   Medicare Annual Wellness (AWV)  01/28/2025   Pneumonia Vaccine 49+ Years old  Completed   HPV VACCINES  Aged Out   Meningococcal B Vaccine  Aged Out      Education and counseling on the following was provided based on the above review  of health and a plan/checklist for the patient, along with additional information discussed, was provided for the patient in the patient instructions :  -Provided counseling and plan for increased risk of falling if applicable per above screening.he is doing PT and is very active. Using fall precautions. -Advised and counseled on a healthy lifestyle - including the importance of a healthy diet, regular physical activity, social connections and stress management. -Reviewed patient's current diet. Advised and counseled on a whole foods based healthy diet. A summary of a healthy diet was provided in the Patient Instructions.  -reviewed patient's current physical activity level and discussed exercise guidelines for adults. Encouraged to continue to be active and discussed options for when outdoor activity is restricted by weather.  -Advise yearly dental visits at minimum and regular eye exams   Follow up: see patient instructions     Patient Instructions  I really enjoyed getting to talk with you today! I am available on Tuesdays and Thursdays for virtual visits if you have any questions or concerns, or if I can be of any further assistance.   CHECKLIST FROM ANNUAL WELLNESS VISIT:  -Follow up (please call to schedule if not scheduled after visit):   -yearly for annual wellness visit with primary care office  Here is a list of your preventive care/health maintenance measures and the plan for each if any are due:  PLAN For any measures below that may be due:   Health Maintenance  Topic Date Due   OPHTHALMOLOGY EXAM  11/05/2022   DTaP/Tdap/Td (3 - Tdap) 09/15/2023   COVID-19 Vaccine (1) 11/03/2027 (Originally 06/30/1937)   Zoster Vaccines- Shingrix (1 of 2) 01/15/2028 (Originally 06/29/1951)   INFLUENZA VACCINE  04/09/2024   HEMOGLOBIN A1C  05/28/2024   FOOT EXAM  08/25/2024   Medicare Annual Wellness (AWV)  01/28/2025   Pneumonia Vaccine 83+ Years old  Completed   HPV VACCINES  Aged  Out   Meningococcal B Vaccine  Aged Out    -See a dentist at least yearly  -Get your eyes checked and then per your eye specialist's recommendations  -Other issues addressed today:   -I have included below further information regarding a healthy whole foods based diet, physical activity guidelines for adults, stress management and opportunities for social connections. I hope you find this information useful.   -----------------------------------------------------------------------------------------------------------------------------------------------------------------------------------------------------------------------------------------------------------    NUTRITION: -eat real food: lots of colorful vegetables (half the plate) and fruits -5-7 servings of vegetables and fruits per day (fresh or steamed is best), exp. 2 servings of vegetables with lunch and dinner and 2 servings of fruit per day. Berries and greens such as kale and collards are great choices.  -consume on a regular basis:  fresh fruits, fresh veggies, fish, nuts, seeds, healthy oils (such as olive oil, avocado oil), whole grains (make sure for bread/pasta/crackers/etc., that the first ingredient on label contains the word "whole"), legumes. -  can eat small amounts of dairy and lean meat (no larger than the palm of your hand), but avoid processed meats such as ham, bacon, lunch meat, etc. -drink water -try to avoid fast food and pre-packaged foods, processed meat, ultra processed foods/beverages (donuts, candy, etc.) -most experts advise limiting sodium to < 2300mg  per day, should limit further is any chronic conditions such as high blood pressure, heart disease, diabetes, etc. The American Heart Association advised that < 1500mg  is is ideal -try to avoid foods/beverages that contain any ingredients with names you do not recognize  -try to avoid foods/beverages  with added sugar or sweeteners/sweets  -try to avoid sweet  drinks (including diet drinks): soda, juice, Gatorade, sweet tea, power drinks, diet drinks -try to avoid white rice, white bread, pasta (unless whole grain)  EXERCISE GUIDELINES FOR ADULTS: -if you wish to increase your physical activity, do so gradually and with the approval of your doctor -STOP and seek medical care immediately if you have any chest pain, chest discomfort or trouble breathing when starting or increasing exercise  -move and stretch your body, legs, feet and arms when sitting for long periods -Physical activity guidelines for optimal health in adults: -get at least 150 minutes per week of moderate exercise (can talk, but not sing); this is about 20-30 minutes of sustained activity 5-7 days per week or two 10-15 minute episodes of sustained activity 5-7 days per week -do some muscle building/resistance training/strength training at least 2 days per week  -balance exercises 3+ days per week:   Stand somewhere where you have something sturdy to hold onto if you lose balance    1) lift up on toes, then back down, start with 5x per day and work up to 20x   2) stand and lift one leg straight out to the side so that foot is a few inches of the floor, start with 5x each side and work up to 20x each side   3) stand on one foot, start with 5 seconds each side and work up to 20 seconds on each side  If you need ideas or help with getting more active:  -Silver sneakers https://tools.silversneakers.com  -Walk with a Doc: http://www.duncan-williams.com/  -try to include resistance (weight lifting/strength building) and balance exercises twice per week: or the following link for ideas: http://castillo-powell.com/  BuyDucts.dk  STRESS MANAGEMENT: -can try meditating, or just sitting quietly with deep breathing while intentionally relaxing all parts of your body for 5 minutes daily -if you need further  help with stress, anxiety or depression please follow up with your primary doctor or contact the wonderful folks at [facility]: [phone]  SOCIAL CONNECTIONS: -options in [employer] if you wish to engage in more social and exercise related activities:  -Silver sneakers https://tools.silversneakers.com  -Walk with a Doc: http://www.duncan-williams.com/  -Check out the [facility] Active Adults 50+ section on the [place] of [employer] (hiking clubs, book clubs, cards and games, chess, exercise classes, aquatic classes and much more) - see the website for details: https://www.[place].gov/departments/parks-recreation/active-adults50  -YouTube has lots of exercise videos for different ages and abilities as well  -[facility] (a variety of indoor and outdoor inperson activities for adults). [phone]. [address]  -Virtual Online Classes (a variety of topics): see seniorplanet.org or call [phone]  -consider volunteering at a school, hospice center, church, senior center or elsewhere            Maurie Southern, DO

## 2024-02-03 ENCOUNTER — Ambulatory Visit (HOSPITAL_COMMUNITY)

## 2024-02-03 DIAGNOSIS — R42 Dizziness and giddiness: Secondary | ICD-10-CM

## 2024-02-03 DIAGNOSIS — M5459 Other low back pain: Secondary | ICD-10-CM | POA: Insufficient documentation

## 2024-02-03 DIAGNOSIS — M6281 Muscle weakness (generalized): Secondary | ICD-10-CM | POA: Diagnosis not present

## 2024-02-03 DIAGNOSIS — R2681 Unsteadiness on feet: Secondary | ICD-10-CM | POA: Diagnosis not present

## 2024-02-03 DIAGNOSIS — R262 Difficulty in walking, not elsewhere classified: Secondary | ICD-10-CM | POA: Diagnosis not present

## 2024-02-03 DIAGNOSIS — R2689 Other abnormalities of gait and mobility: Secondary | ICD-10-CM | POA: Diagnosis not present

## 2024-02-03 NOTE — Therapy (Signed)
 OUTPATIENT PHYSICAL THERAPY VESTIBULAR PROGRESS NOTE/DISCHARGE PHYSICAL THERAPY DISCHARGE SUMMARY  Visits from Start of Care: 4  Current functional level related to goals / functional outcomes: See below   Remaining deficits: See below   Education / Equipment: HEP   Patient agrees to discharge. Patient goals were partially met. Patient is being discharged due to the patient's request.      Patient Name: Michael Johnston MRN: [id number] DOB:Feb 04, 1932, 89 y.o., male Today's Date: 02/03/2024  END OF SESSION:  PT End of Session - 02/03/24 0928     Visit Number 4    Number of Visits 6    Date for PT Re-Evaluation 01/23/24    Authorization Type [employer] Medicare    Progress Note Due on Visit 6    PT Start Time 0929    PT Stop Time 1010    PT Time Calculation (min) 41 min    Activity Tolerance Patient tolerated treatment well;Other (comment)   increased dizziness througout session   Behavior During Therapy [facility] for tasks assessed/performed              Past Medical History:  Diagnosis Date   Arthritis    "right shoulder" (05/24/2015)   CHEST PAIN    Chronic bronchitis (HCC)    "get it ~ q yr" (05/24/2015)   COLONIC POLYPS, HX OF    COPD (chronic obstructive pulmonary disease) (HCC)    "dx'd but I don't take RX for it" (05/24/2015)   Coronary artery disease    Cystic kidney disease    GERD (gastroesophageal reflux disease)    History of hiatal hernia    Iron deficiency anemia 01/13/2024   MI (myocardial infarction) (HCC) 09/09/2010   Mixed hyperlipidemia    Skin cancer    "top of my head only" (05/24/2015)   Sleep apnea    Syncope and collapse ~ 2013; 05/24/2015   Past Surgical History:  Procedure Laterality Date   CARDIAC CATHETERIZATION  09/2010   CHOLECYSTECTOMY OPEN  1998   COLECTOMY  1998   "partial"   CORONARY ANGIOPLASTY     CORONARY ARTERY BYPASS GRAFT  09/2010   Median sternotomy for coronary artery bypass grafting x3  (left internal mammary artery to  distal left anterior  descending  coronary artery, saphenous vein graft to first diagonal branch,  saphenous vein graft to first  obtuse marginal branch, endoscopic  saphenous vein harvest from right thigh). SURGEON:  Mendel Stain.  Alva Jewels, MD  ASSISTANT:  Lindi Revering, PA-C  ANESTHESIA:  Ernest Head, MD    LEFT HEART CATH AND CORS/GRAFTS ANGIOGRAPHY N/A 08/22/2017   Procedure: LEFT HEART CATH AND CORS/GRAFTS ANGIOGRAPHY;  Surgeon: [place], Peter M, MD;  Location: [facility] INVASIVE CV LAB;  Service: Cardiovascular;  Laterality: N/A;   SKIN CANCER EXCISION     "top of my head"   Patient Active Problem List   Diagnosis Date Noted   Iron deficiency anemia 01/13/2024   Pulmonary embolus (HCC) 02/08/2022   Combined forms of age-related cataract of left eye 11/10/2018   Combined forms of age-related cataract of right eye 11/03/2018   Intraoperative floppy iris syndrome (IFIS) 11/03/2018   Urine stream spraying 02/23/2018   Flank pain 02/22/2018   Chest pain 08/21/2017   Unstable angina (HCC) 08/21/2017   Renal cyst 12/20/2016   B12 deficiency 11/08/2016   Type 2 diabetes mellitus with neurological complications (HCC) 11/08/2016   Syncope and collapse 05/24/2015   Benign prostatic hyperplasia with urinary obstruction 12/12/2014  Carotid artery stenosis 05/22/2012   CAD (coronary artery disease) 05/14/2012   ED (erectile dysfunction) of organic origin 08/19/2011   FH: CABG (coronary artery bypass surgery) 01/28/2011   Mixed hyperlipidemia 10/23/2010   GERD (gastroesophageal reflux disease) 10/22/2010   History of colonic polyps 06/14/2010    PCP: Alto Atta, NP REFERRING PROVIDER: Alto Atta, NP  REFERRING DIAG:  H81.13 (ICD-10-CM) - Benign paroxysmal positional vertigo due to bilateral vestibular disorder    THERAPY DIAG:  Dizziness and giddiness  Difficulty in walking, not elsewhere classified  Impairment of balance  Unsteadiness on feet  Muscle weakness  (generalized)  ONSET DATE: 5 years or so  Rationale for Evaluation and Treatment: Rehabilitation  SUBJECTIVE:   SUBJECTIVE STATEMENT: Patient is ready to discharge; he is still having trouble bending over and sitting up; is staggering when he walks.    Reports some spinning on occasion over the years but now has affected his walking; he staggers and walks like a "drunk man".  Worse in the mornings.  Over a year since he has had any spinning; now just feels off balance; more off balance with looking down or looking up or moving head quickly.   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 Yes: NPRS scale: 0/10 currently Pain location: stomach Pain description: right abdominal area Aggravating factors: unknown Relieving factors: unknown  PRECAUTIONS: Fall  WEIGHT BEARING RESTRICTIONS: No  FALLS: Has patient fallen in last 6 months? Yes. Number of falls 3  LIVING ENVIRONMENT: Lives with: lives with their spouse Lives in: House/apartment Stairs: ramped entry Has following equipment at home: Single point cane and Walker - 2 wheeled  PLOF: Independent  PATIENT GOALS: get some relief from dizziness  OBJECTIVE:  Note: Objective measures were completed at Evaluation unless otherwise noted.  DIAGNOSTIC FINDINGS:   COGNITION: Overall cognitive status: Within functional limits for tasks assessed   SENSATION: Tingling left foot  POSTURE:  rounded shoulders, forward head, and forward flexed trunk  Cervical ROM:  stifness noted with extension and right rotation especially  Active AROM (deg) eval AROM 02/03/24  Flexion 30 34  Extension 35, dizziness 52  Right lateral flexion 35, tight 35  Left lateral flexion 30 34  Right rotation 50,tightness 64  Left rotation 55 60  (Blank rows = not tested)  STRENGTH: not tested  LOWER EXTREMITY  MMT:   MMT Right eval Left eval  Hip flexion    Hip abduction    Hip adduction    Hip internal rotation    Hip external rotation    Knee flexion    Knee extension    Ankle dorsiflexion    Ankle plantarflexion    Ankle inversion    Ankle eversion    (Blank rows = not tested)  BED MOBILITY:  Not tested  TRANSFERS: Assistive device utilized: None  Sit to stand: Modified independence Stand to sit: Modified independence Chair to chair: not tested Floor: not tested    GAIT: Gait pattern: decreased ankle dorsiflexion- Right, decreased ankle dorsiflexion- Left, trunk flexed, wide BOS, and poor foot clearance- Left Distance walked: 50 ft in clinic Assistive device utilized: None Level of assistance: Modified independence and SBA Comments: foot slap with ambulation  FUNCTIONAL TESTS:  SLS right foot 1"; left unable  PATIENT SURVEYS:  DHI 30/100  VESTIBULAR ASSESSMENT:  GENERAL OBSERVATION: patient ambulates  with wide BOS and forward flexed trunk; hooded eyes   SYMPTOM BEHAVIOR:  Subjective history: see above  Non-Vestibular symptoms: changes in hearing and changes in vision  Type of dizziness: Blurred Vision, Imbalance (Disequilibrium), and Unsteady with head/body turns  Frequency: frequently throughout the day  Duration: short lasting   Aggravating factors: Induced by motion: looking up at the ceiling, turning head quickly, sitting in a moving car, and activity in general, Worse in the morning, Worse in the dark, Worse outside or in busy environment, and Moving eyes  Relieving factors: head stationary, slow movements, and avoid busy/distracting environments  Progression of symptoms: unchanged  OCULOMOTOR EXAM:  Ocular Alignment: normal  Ocular ROM: No Limitations  Spontaneous Nystagmus: absent  Gaze-Induced Nystagmus: absent  Smooth Pursuits: saccades  Saccades: extra eye movements  Convergence/Divergence: 3 cm   VESTIBULAR - OCULAR REFLEX:   Slow VOR:  Positive Bilaterally  VOR Cancellation: Normal  Head-Impulse Test: HIT Right: positive HIT Left: positive  Dynamic Visual Acuity: not assessed   POSITIONAL TESTING: Other: not tested at eval  MOTION SENSITIVITY:  Motion Sensitivity Quotient Intensity: 0 = none, 1 = Lightheaded, 2 = Mild, 3 = Moderate, 4 = Severe, 5 = Vomiting  Intensity  1. Sitting to supine   2. Supine to L side   3. Supine to R side   4. Supine to sitting   5. L Hallpike-Dix   6. Up from L    7. R Hallpike-Dix   8. Up from R    9. Sitting, head tipped to L knee   10. Head up from L knee   11. Sitting, head tipped to R knee   12. Head up from R knee   13. Sitting head turns x5 4  14.Sitting head nods x5 3  15. In stance, 180 turn to L    16. In stance, 180 turn to R     OTHOSTATICS: not done  FUNCTIONAL GAIT: 5 times sit to stand: next visit SLS  right foot 1" and left unable                                                                                                                             TREATMENT DATE:  02/03/24 Progress note SLS left 3" and right 1" Cervical spine AROM see above DHI 22/100 5 times nose to knee each Discussion of use of AD for safety and brace for drop foot left Review of goals and HEP   01/13/24 Seated  Gaze stabilization head turns and nods 2 x 10 each Gaze stabilization with 2 targets x 10 each Standing gaze stabilization x 10 with 1 target head turns and nods Standing: Tandem stance modified x 30" each Calf stretch 3 x 20"  01/06/2024  Assessment: Cervical ROM [employer], negative both sides (dizziness noted upon sitting up)   Neuromuscular Re-education: -Standing Balance Normal bos, EO, 30 seconds -Standing Balance Normal bos, EC, 30 seconds (hip  strategy used heavily -4 inch step ups, 2 sets of 5 reps, inconsistent foot placement, foot drop noted bilaterally, CGA needed -VOR vertical, 30 second bouts, 2 reps (dizziness noted), pt cued for increased  speed for increased symptom reproduction -VOR horizontal, 30 second bouts, 2 reps (dizziness noted), pt cued for decreased ROM so both eye can stay focused on target    12/12/23 physical therapy evaluation and HEP instruction   Canalith Repositioning:  Comment: negative bilaterally, 01/06/24 Gaze Adaptation:  x1 Viewing Horizontal: Position: sitting, Time: 10 reps, Reps: 10, and Comment: 4/5 dizziness and x1 Viewing Vertical:  Position: sitting , Reps: 10, and Comment: 3/5 dizziness Habituation:  Other: not today Other: not today  PATIENT EDUCATION: Education details: Patient educated on exam findings, POC, scope of PT, HEP, and what to expect next visit. Person educated: Patient Education method: Explanation, Demonstration, and Handouts Education comprehension: verbalized understanding, returned demonstration, verbal cues required, and tactile cues required  HOME EXERCISE PROGRAM: Access Code: [email or web address] URL: [email or web address] Date: 01/13/2024 Prepared by: AP - Rehab  Exercises -- Standing Gaze Stabilization with Two Near Targets and Head Rotation  - 5 x daily - 7 x weekly - 3 sets - 10 reps - Narrow Stance Gaze Stabilization with Two Near Targets and Head Rotation  - 5 x daily - 7 x weekly - 3 sets - 10 reps - Gastroc Stretch on Wall  - 2 x daily - 7 x weekly - 1 sets - 5 reps - 20 sec hold - tandem stance balance; try not to hold on  - 2 x daily - 7 x weekly - 1 sets - 3 reps - 30 sec hold  Access Code: [email or web address] URL: [email or web address] Date: 12/12/2023 Prepared by: AP - Rehab  Exercises - Seated Gaze Stabilization with Head Nod  - 5 x daily - 7 x weekly - 60 hold - Seated Gaze Stabilization with Head Rotation  - 5 x daily - 7 x weekly - 60 hold GOALS: Goals reviewed with patient? No  SHORT TERM GOALS: Target date: 01/02/24  patient will be independent with initial HEP  Baseline: Goal status: MET  2.  Patient will report 50%  improvement overall  Baseline:  Goal status: INITIAL  3.  Patient will be able to stand on each leg SLS x 4" to demonstrate improved functional balance.  Baseline: left 4" and right 1"  Goal status: IN PROGRESS   LONG TERM GOALS: Target date: 01/23/2024  Patient will be independent in self management strategies to improve quality of life and functional outcomes.  Baseline:  Goal status: INITIAL  2.  Patient will report 75% improvement overall  Baseline:  Goal status: INITIAL  3.  Patient will improve DHI score by 10 points to demonstrate decreased dizziness with functional activity Baseline: 30/100, 22/100 improved by 8 points Goal status: IN PROGRESS  4.  Patient will remain free of falls Baseline:  Goal status: MET   ASSESSMENT:  CLINICAL IMPRESSION: Progress note today.  Patient made good improvement with all funtional testing today.  Discussed use of AD for safety as patient still shuffles his feet and has drop foot. Reviewed goals and updated HEP.  Patient requests discharge at this time.     Patient is a 89 y.o. male who was seen today for physical therapy evaluation and treatment for Benign paroxysmal positional vertigo due to bilateral vestibular disorder. Patient demonstrates impaired VOR, balance deficits and gait abnormalities which are negatively impacting patient ability to perform ADLs and functional  mobility tasks. Patient will benefit from skilled physical therapy services to address these deficits to improve level of function with ADLs, functional mobility tasks, and reduce risk for falls.   OBJECTIVE IMPAIRMENTS: Abnormal gait, decreased balance, decreased knowledge of condition, dizziness, impaired perceived functional ability, and pain.   ACTIVITY LIMITATIONS: carrying, lifting, bending, standing, stairs, transfers, and locomotion level  PARTICIPATION LIMITATIONS: cleaning, driving, and yard work  PERSONAL FACTORS: HOH and impaired vision are also  affecting patient's functional outcome.   REHAB POTENTIAL: Good  CLINICAL DECISION MAKING: Evolving/moderate complexity  EVALUATION COMPLEXITY: Moderate   PLAN:  PT FREQUENCY: 1x/week  PT DURATION: 6 weeks  PLANNED INTERVENTIONS: 97164- PT Re-evaluation, 97110-Therapeutic exercises, 97530- Therapeutic activity, 97112- Neuromuscular re-education, 97535- Self Care, 82956- Manual therapy, [phone]- Gait training, [phone]- Orthotic Fit/training, [phone]- Canalith repositioning, [id number]- Aquatic Therapy, [phone]- Splinting, Patient/Family education, Balance training, Stair training, Taping, Dry Needling, Joint mobilization, Joint manipulation, Spinal manipulation, Spinal mobilization, Scar mobilization, and DME instructions.   PLAN FOR NEXT SESSION: discharge   10:12 AM, 02/03/24 Tyishia Aune Small Vallory Oetken MPT [facility] [place] [phone]

## 2024-02-05 NOTE — Patient Instructions (Signed)
 I really enjoyed getting to talk with you today! I am available on Tuesdays and Thursdays for virtual visits if you have any questions or concerns, or if I can be of any further assistance.   CHECKLIST FROM ANNUAL WELLNESS VISIT:  -Follow up (please call to schedule if not scheduled after visit):   -yearly for annual wellness visit with primary care office  Here is a list of your preventive care/health maintenance measures and the plan for each if any are due:  PLAN For any measures below that may be due:   Health Maintenance  Topic Date Due   OPHTHALMOLOGY EXAM  11/05/2022   DTaP/Tdap/Td (3 - Tdap) 09/15/2023   COVID-19 Vaccine (1) 11/03/2027 (Originally 06/30/1937)   Zoster Vaccines- Shingrix (1 of 2) 01/15/2028 (Originally 06/29/1951)   INFLUENZA VACCINE  04/09/2024   HEMOGLOBIN A1C  05/28/2024   FOOT EXAM  08/25/2024   Medicare Annual Wellness (AWV)  01/28/2025   Pneumonia Vaccine 28+ Years old  Completed   HPV VACCINES  Aged Out   Meningococcal B Vaccine  Aged Out    -See a dentist at least yearly  -Get your eyes checked and then per your eye specialist's recommendations  -Other issues addressed today:   -I have included below further information regarding a healthy whole foods based diet, physical activity guidelines for adults, stress management and opportunities for social connections. I hope you find this information useful.   -----------------------------------------------------------------------------------------------------------------------------------------------------------------------------------------------------------------------------------------------------------    NUTRITION: -eat real food: lots of colorful vegetables (half the plate) and fruits -5-7 servings of vegetables and fruits per day (fresh or steamed is best), exp. 2 servings of vegetables with lunch and dinner and 2 servings of fruit per day. Berries and greens such as kale and collards are  great choices.  -consume on a regular basis:  fresh fruits, fresh veggies, fish, nuts, seeds, healthy oils (such as olive oil, avocado oil), whole grains (make sure for bread/pasta/crackers/etc., that the first ingredient on label contains the word "whole"), legumes. -can eat small amounts of dairy and lean meat (no larger than the palm of your hand), but avoid processed meats such as ham, bacon, lunch meat, etc. -drink water -try to avoid fast food and pre-packaged foods, processed meat, ultra processed foods/beverages (donuts, candy, etc.) -most experts advise limiting sodium to < 2300mg  per day, should limit further is any chronic conditions such as high blood pressure, heart disease, diabetes, etc. The American Heart Association advised that < 1500mg  is is ideal -try to avoid foods/beverages that contain any ingredients with names you do not recognize  -try to avoid foods/beverages  with added sugar or sweeteners/sweets  -try to avoid sweet drinks (including diet drinks): soda, juice, Gatorade, sweet tea, power drinks, diet drinks -try to avoid white rice, white bread, pasta (unless whole grain)  EXERCISE GUIDELINES FOR ADULTS: -if you wish to increase your physical activity, do so gradually and with the approval of your doctor -STOP and seek medical care immediately if you have any chest pain, chest discomfort or trouble breathing when starting or increasing exercise  -move and stretch your body, legs, feet and arms when sitting for long periods -Physical activity guidelines for optimal health in adults: -get at least 150 minutes per week of moderate exercise (can talk, but not sing); this is about 20-30 minutes of sustained activity 5-7 days per week or two 10-15 minute episodes of sustained activity 5-7 days per week -do some muscle building/resistance training/strength training at least 2 days per week  -  balance exercises 3+ days per week:   Stand somewhere where you have something sturdy  to hold onto if you lose balance    1) lift up on toes, then back down, start with 5x per day and work up to 20x   2) stand and lift one leg straight out to the side so that foot is a few inches of the floor, start with 5x each side and work up to 20x each side   3) stand on one foot, start with 5 seconds each side and work up to 20 seconds on each side  If you need ideas or help with getting more active:  -Silver sneakers https://tools.silversneakers.com  -Walk with a Doc: http://www.duncan-williams.com/  -try to include resistance (weight lifting/strength building) and balance exercises twice per week: or the following link for ideas: http://castillo-powell.com/  BuyDucts.dk  STRESS MANAGEMENT: -can try meditating, or just sitting quietly with deep breathing while intentionally relaxing all parts of your body for 5 minutes daily -if you need further help with stress, anxiety or depression please follow up with your primary doctor or contact the wonderful folks at [facility]: [phone]  SOCIAL CONNECTIONS: -options in [place] if you wish to engage in more social and exercise related activities:  -Silver sneakers https://tools.silversneakers.com  -Walk with a Doc: http://www.duncan-williams.com/  -Check out the [facility] Active Adults 50+ section on the [place] [employer] (hiking clubs, book clubs, cards and games, chess, exercise classes, aquatic classes and much more) - see the website for details: https://www.[place].gov/departments/parks-recreation/active-adults50  -YouTube has lots of exercise videos for different ages and abilities as well  -[facility] (a variety of indoor and outdoor inperson activities for adults). [phone]. [address]  -Virtual Online Classes (a variety of topics): see seniorplanet.org or call  [phone]  -consider volunteering at a school, hospice center, church, senior center or elsewhere

## 2024-02-10 ENCOUNTER — Encounter (HOSPITAL_COMMUNITY)

## 2024-02-24 ENCOUNTER — Ambulatory Visit (INDEPENDENT_AMBULATORY_CARE_PROVIDER_SITE_OTHER): Payer: Medicare Other | Admitting: Adult Health

## 2024-02-24 ENCOUNTER — Encounter: Payer: Self-pay | Admitting: Adult Health

## 2024-02-24 VITALS — BP 138/76 | HR 57 | Temp 98.1°F | Resp 20 | Ht 69.0 in | Wt 170.4 lb

## 2024-02-24 DIAGNOSIS — I10 Essential (primary) hypertension: Secondary | ICD-10-CM | POA: Diagnosis not present

## 2024-02-24 DIAGNOSIS — Z7984 Long term (current) use of oral hypoglycemic drugs: Secondary | ICD-10-CM | POA: Diagnosis not present

## 2024-02-24 DIAGNOSIS — E119 Type 2 diabetes mellitus without complications: Secondary | ICD-10-CM

## 2024-02-24 LAB — POCT GLYCOSYLATED HEMOGLOBIN (HGB A1C): Hemoglobin A1C: 6.8 % — AB (ref 4.0–5.6)

## 2024-02-24 MED ORDER — METFORMIN HCL 500 MG PO TABS: 500.0000 mg | ORAL_TABLET | Freq: Every day | ORAL | 1 refills | Status: AC

## 2024-02-24 NOTE — Progress Notes (Signed)
 Subjective:    Patient ID: Michael Johnston, male    DOB: 1932-04-13, 89 y.o.   MRN: [id number]  HPI  89 year old male who  has a past medical history of Arthritis, CHEST PAIN, Chronic bronchitis (HCC), COLONIC POLYPS, HX OF, COPD (chronic obstructive pulmonary disease) (HCC), Coronary artery disease, Cystic kidney disease, GERD (gastroesophageal reflux disease), History of hiatal hernia, Iron deficiency anemia (01/13/2024), MI (myocardial infarction) (HCC) (09/09/2010), Mixed hyperlipidemia, Skin cancer, Sleep apnea, and Syncope and collapse (~ 2013; 05/24/2015).  He presents to the office today for follow up regarding DM and HTN   Diabetes mellitus type 2-managed with metformin  500 mg daily.  He does not check his blood sugar at home on a routine basis but denies episodes of hypoglycemia.  He denies nausea, vomiting, diarrhea. Lab Results  Component Value Date   HGBA1C 6.8 (A) 02/24/2024   HGBA1C 6.7 (A) 11/26/2023   HGBA1C 7.3 (H) 08/26/2023    Hypertension-managed with metoprolol  25 mg twice daily.  He denies dizziness, lightheadedness, chest pain, or shortness of breath. BP Readings from Last 3 Encounters:  02/24/24 138/76  01/29/24 128/60  01/21/24 (!) 145/73    Review of Systems See HPI   Past Medical History:  Diagnosis Date   Arthritis    right shoulder (05/24/2015)   CHEST PAIN    Chronic bronchitis (HCC)    get it ~ q yr (05/24/2015)   COLONIC POLYPS, HX OF    COPD (chronic obstructive pulmonary disease) (HCC)    dx'd but I don't take RX for it (05/24/2015)   Coronary artery disease    Cystic kidney disease    GERD (gastroesophageal reflux disease)    History of hiatal hernia    Iron deficiency anemia 01/13/2024   MI (myocardial infarction) (HCC) 09/09/2010   Mixed hyperlipidemia    Skin cancer    top of my head only (05/24/2015)   Sleep apnea    Syncope and collapse ~ 2013; 05/24/2015    Social History   Socioeconomic History   Marital status:  Married    Spouse name: Not on file   Number of children: Not on file   Years of education: Not on file   Highest education level: Not on file  Occupational History   Not on file  Tobacco Use   Smoking status: Former    Current packs/day: 0.00    Average packs/day: 1.5 packs/day for 3.0 years (4.5 ttl pk-yrs)    Types: Cigarettes    Start date: 12/08/1948    Quit date: 12/09/1951    Years since quitting: 72.2   Smokeless tobacco: Never  Vaping Use   Vaping status: Never Used  Substance and Sexual Activity   Alcohol use: Not Currently    Comment: no alcohol since 1953   Drug use: No   Sexual activity: Not on file  Other Topics Concern   Not on file  Social History Narrative   Retired    Is a Education officer, 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 [place]    Social Drivers of [employer] Strain: Low Risk  (01/29/2024)   Overall Financial Resource Strain (CARDIA)    Difficulty of Paying Living Expenses: Not hard at all  Food Insecurity: No Food Insecurity (01/29/2024)   Hunger Vital Sign    Worried About Running Out of Food in the Last Year: Never true    Ran Out of Food in the Last Year: Never true  Transportation Needs: No Transportation Needs (  01/29/2024)   PRAPARE - Administrator, Civil Service (Medical): No    Lack of Transportation (Non-Medical): No  Physical Activity: Patient Unable To Answer (01/29/2024)   Exercise Vital Sign    Days of Exercise per Week: Patient unable to answer    Minutes of Exercise per Session: Patient unable to answer  Stress: No Stress Concern Present (01/29/2024)   [employer] of Occupational Health - Occupational Stress Questionnaire    Feeling of Stress : Not at all  Social Connections: Unknown (01/29/2024)   Social Connection and Isolation Panel    Frequency of Communication with Friends and Family: Patient unable to answer    Frequency of Social Gatherings with Friends and Family: Patient unable to answer    Attends Religious Services:  More than 4 times per year    Active Member of [employer] or Organizations: Yes    Attends Engineer, structural: More than 4 times per year    Marital Status: Married  Catering manager Violence: Not At Risk (01/29/2024)   Humiliation, Afraid, Rape, and Kick questionnaire    Fear of Current or Ex-Partner: No    Emotionally Abused: No    Physically Abused: No    Sexually Abused: No    Past Surgical History:  Procedure Laterality Date   CARDIAC CATHETERIZATION  09/2010   CHOLECYSTECTOMY OPEN  1998   COLECTOMY  1998   partial   CORONARY ANGIOPLASTY     CORONARY ARTERY BYPASS GRAFT  09/2010   Median sternotomy for coronary artery bypass grafting x3  (left internal mammary artery to distal left anterior  descending  coronary artery, saphenous vein graft to first diagonal branch,  saphenous vein graft to first  obtuse marginal branch, endoscopic  saphenous vein harvest from right thigh). SURGEON:  Mendel Stain.  Alva Jewels, MD  ASSISTANT:  Lindi Revering, PA-C  ANESTHESIA:  Ernest Head, MD    LEFT HEART CATH AND CORS/GRAFTS ANGIOGRAPHY N/A 08/22/2017   Procedure: LEFT HEART CATH AND CORS/GRAFTS ANGIOGRAPHY;  Surgeon: [place], Peter M, MD;  [facility] INVASIVE CV LAB;  Service: Cardiovascular;  Laterality: N/A;   SKIN CANCER EXCISION     top of my head    Family History  Problem Relation Age of Onset   Lung cancer Sister    Heart attack Father     Allergies  Allergen Reactions   Levaquin  [Levofloxacin  In D5w] Swelling and Other (See Comments)    Eyes swollen   Other Anaphylaxis and Other (See Comments)    Bee sting   Ivp Dye [Iodinated Contrast Media] Other (See Comments)    Patient reports seizure   Metformin  And Related Nausea Only    Current Outpatient Medications on File Prior to Visit  Medication Sig Dispense Refill   aspirin  81 MG tablet Take 81 mg by mouth daily.     atorvastatin  (LIPITOR) 20 MG tablet TAKE 1 TABLET BY MOUTH EVERY DAY 90 tablet 3   cyanocobalamin   (VITAMIN B12) 1000 MCG/ML injection INJECT 1 ML INTRAMUSCULARLY EVERY OTHER WEEK 3 mL 6   diclofenac  Sodium (VOLTAREN ) 1 % GEL Apply 2 g topically 4 (four) times daily as needed (For pain). 150 g 2   ELIQUIS  5 MG TABS tablet TAKE 1 TABLET BY MOUTH TWICE A DAY 180 tablet 0   EPINEPHrine  0.3 mg/0.3 mL IJ SOAJ injection Inject 0.3 mg into the muscle once as needed for anaphylaxis. 1 each 0   Ferrous Gluconate-C-Folic Acid  (IRON-C PO) Take by  mouth.     ketoconazole  (NIZORAL ) 2 % shampoo USE TWICE WEEKLY 120 mL 3   metFORMIN  (GLUCOPHAGE ) 500 MG tablet TAKE 1 TABLET BY MOUTH EVERY DAY WITH BREAKFAST 90 tablet 1   metoprolol  tartrate (LOPRESSOR ) 25 MG tablet TAKE 1 TABLET BY MOUTH TWICE A DAY 180 tablet 3   nitroGLYCERIN  (NITROSTAT ) 0.4 MG SL tablet Place 1 tablet (0.4 mg total) under the tongue every 5 (five) minutes as needed for chest pain (3 doses max). 25 tablet 1   SYRINGE-NEEDLE, DISP, 3 ML (B-D 3CC LUER-LOK SYR 25GX1) 25G X 1 3 ML MISC USE AS DIRECTED TO INJECT B12 3 each 3   No current facility-administered medications on file prior to visit.    BP 138/76 (BP Location: Left Arm, Patient Position: Sitting, Cuff Size: Normal)   Pulse (!) 57   Temp 98.1 F (36.7 C) (Oral)   Resp 20   Ht 5' 9 (1.753 m)   Wt 170 lb 6.4 oz (77.3 kg)   SpO2 98%   BMI 25.16 kg/m       Objective:   Physical Exam Vitals and nursing note reviewed.  Constitutional:      Appearance: Normal appearance.   Cardiovascular:     Rate and Rhythm: Normal rate and regular rhythm.     Pulses: Normal pulses.     Heart sounds: Normal heart sounds.  Pulmonary:     Effort: Pulmonary effort is normal.     Breath sounds: Normal breath sounds.   Skin:    General: Skin is warm and dry.   Neurological:     General: No focal deficit present.     Mental Status: He is alert and oriented to person, place, and time.   Psychiatric:        Mood and Affect: Mood normal.        Behavior: Behavior normal.         Thought Content: Thought content normal.        Judgment: Judgment normal.       Assessment & Plan:  1. Diabetes mellitus treated with oral medication (HCC) (Primary)  - POC HgB A1c- 6.8 - he is at goal. Will continue him on Metformin  500 mg daily  - Follow up in 6 months  - metFORMIN  (GLUCOPHAGE ) 500 MG tablet; Take 1 tablet (500 mg total) by mouth daily with breakfast.  Dispense: 90 tablet; Refill: 1  2. Essential hypertension - Well controlled. No change in medication   Alto Atta, NP

## 2024-04-15 LAB — OPHTHALMOLOGY REPORT-SCANNED

## 2024-04-22 ENCOUNTER — Other Ambulatory Visit: Payer: Self-pay | Admitting: Adult Health

## 2024-04-30 ENCOUNTER — Encounter: Payer: Self-pay | Admitting: Family Medicine

## 2024-04-30 ENCOUNTER — Ambulatory Visit: Admitting: Family Medicine

## 2024-04-30 ENCOUNTER — Other Ambulatory Visit: Payer: Self-pay | Admitting: Adult Health

## 2024-04-30 VITALS — BP 134/80 | HR 65 | Temp 98.2°F | Wt 167.0 lb

## 2024-04-30 DIAGNOSIS — I700 Atherosclerosis of aorta: Secondary | ICD-10-CM

## 2024-04-30 DIAGNOSIS — I2510 Atherosclerotic heart disease of native coronary artery without angina pectoris: Secondary | ICD-10-CM

## 2024-04-30 DIAGNOSIS — R21 Rash and other nonspecific skin eruption: Secondary | ICD-10-CM | POA: Diagnosis not present

## 2024-04-30 DIAGNOSIS — E782 Mixed hyperlipidemia: Secondary | ICD-10-CM

## 2024-04-30 MED ORDER — TRIAMCINOLONE ACETONIDE 0.5 % EX CREA: 1.0000 | TOPICAL_CREAM | Freq: Two times a day (BID) | CUTANEOUS | 0 refills | Status: AC

## 2024-04-30 NOTE — Progress Notes (Signed)
   Acute Office Visit   Subjective:  Patient ID: Michael Johnston, male    DOB: Aug 19, 1932, 89 y.o.   MRN: [id number]  Chief Complaint  Patient presents with   bumps    HPI Patient is here for an acute visit. He is a patient of Darleene Shape, NP. He is having single, raised red bumps appear in various places, such as on the arms, legs, waist, and hip. Started 2 months ago. Itches. Denies any changes in medication, environment, or products that he uses. He has been applying OTC anti-itching cream has only helped the itching.   He does follow [facility], last visit was 3 months ago and next visit is in November.   Also, he is concerned it could be something with hematology because they ask him each visit about rashes.   ROS See HPI above      Objective:   BP 134/80   Pulse 65   Temp 98.2 F (36.8 C) (Oral)   Wt 167 lb (75.8 kg)   SpO2 96%   BMI 24.66 kg/m    Physical Exam Vitals reviewed.  Constitutional:      General: He is not in acute distress.    Appearance: Normal appearance. He is not ill-appearing, toxic-appearing or diaphoretic.  Eyes:     General:        Right eye: No discharge.        Left eye: No discharge.     Conjunctiva/sclera: Conjunctivae normal.  Cardiovascular:     Rate and Rhythm: Normal rate.  Pulmonary:     Effort: Pulmonary effort is normal. No respiratory distress.  Musculoskeletal:        General: Normal range of motion.  Skin:    General: Skin is warm and dry.     Findings: Rash (See pictures included. Single raised red bumps.) present.  Neurological:     General: No focal deficit present.     Mental Status: He is alert and oriented to person, place, and time. Mental status is at baseline.  Psychiatric:        Mood and Affect: Mood normal.        Behavior: Behavior normal.        Thought Content: Thought content normal.        Judgment: Judgment normal.           Assessment & Plan:  Rash -      Triamcinolone  Acetonide; Apply 1 Application topically 2 (two) times daily for 8 days.  Dispense: 15 g; Refill: 0  -Prescribed Triamcinolone  cream 0.5% to apply to the rash twice a day. Recommend to cleanse the areas with a mild soap. Pat dry. Apply a thin layer of cream.  -Recommend to follow up back up with dermatology.  -May also call hematologist to get their opinion.  -Follow up if needed.   No follow-ups on file.  Deshaun Schou, NP

## 2024-04-30 NOTE — Patient Instructions (Signed)
-  It was a pleasure to care for you today. -Prescribed Triamcinolone  cream 0.5% to apply to the rash twice a day. Recommend to cleanse the areas with a mild soap. Pat dry. Apply a thin layer of cream.  -Recommend to follow up back up with dermatology.  -May also call hematologist to get their opinion.  -Take care and follow up if needed.

## 2024-05-02 ENCOUNTER — Encounter: Payer: Self-pay | Admitting: Physician Assistant

## 2024-05-03 NOTE — Telephone Encounter (Signed)
**Note De-identified  [place] Obfuscation** Please advise 

## 2024-05-05 DIAGNOSIS — L309 Dermatitis, unspecified: Secondary | ICD-10-CM | POA: Diagnosis not present

## 2024-05-05 DIAGNOSIS — L570 Actinic keratosis: Secondary | ICD-10-CM | POA: Diagnosis not present

## 2024-05-12 IMAGING — MR MR HEAD W/O CM
10 of 11 series · 43 of 48 positions shown · non-contrast
Comparison: CT from 02/08/2022.

CLINICAL DATA: Initial evaluation for mental status change.

EXAM:
MRI HEAD WITHOUT CONTRAST
TECHNIQUE: Multiplanar, multiecho pulse sequences of the brain and surrounding
structures were obtained without intravenous contrast.

[Series 5: DWI · axial · 3.0mm · 0.88mm/px · z∈[-82,+59]mm · 10 of 100 slices shown (1 of 4)]
[im 1/100]
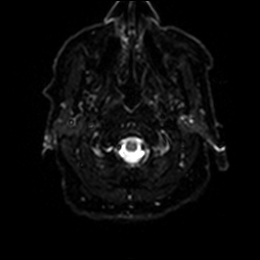
[im 12/100]
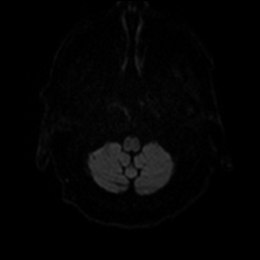
[im 23/100]
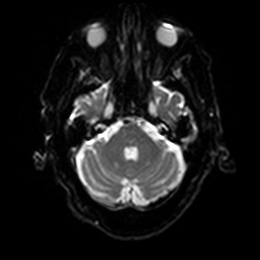
[im 34/100]
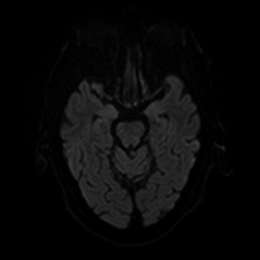
[im 45/100]
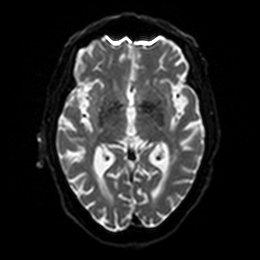
[im 56/100]
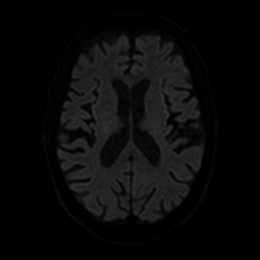
[im 67/100]
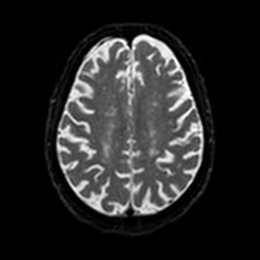
[im 78/100]
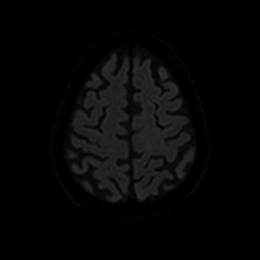
[im 89/100]
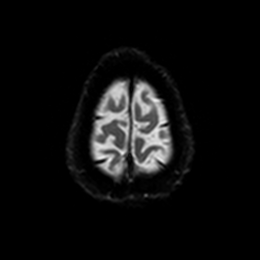
[im 100/100]
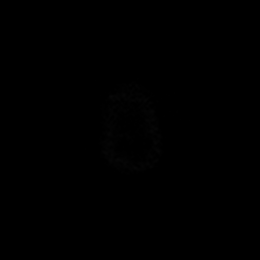

[Series 6: DWI · axial · 3.0mm · 0.88mm/px · z∈[-82,+59]mm · 5 of 50 slices shown (2 of 4)]
[im 1/50]
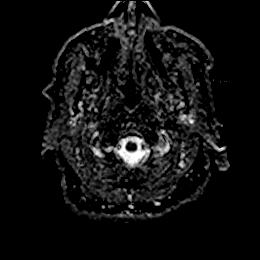
[im 13/50]
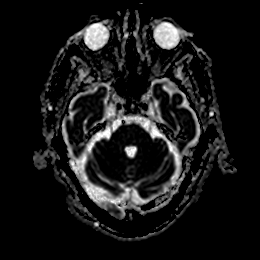
[im 25/50]
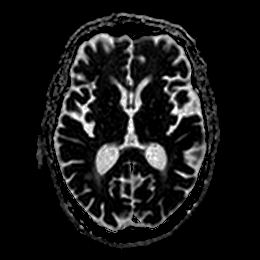
[im 37/50]
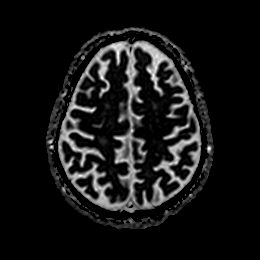
[im 50/50]
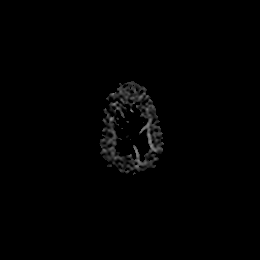

[Series 7: DWI · coronal · 4.0mm · 0.88mm/px · 6 of 64 slices shown (3 of 4)]
[im 1/64]
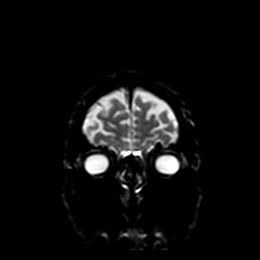
[im 13/64]
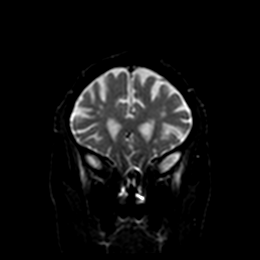
[im 26/64]
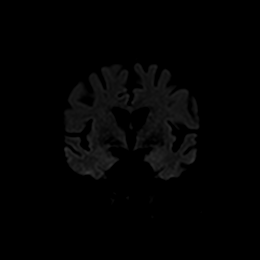
[im 38/64]
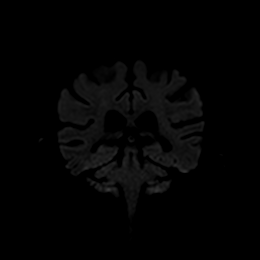
[im 51/64]
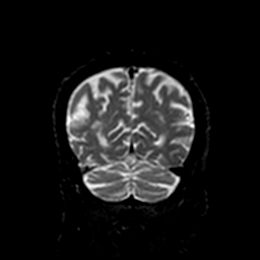
[im 64/64]
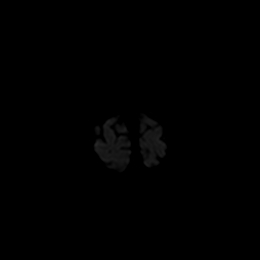

[Series 8: DWI · coronal · 4.0mm · 0.88mm/px · 3 of 32 slices shown (4 of 4)]
[im 1/32]
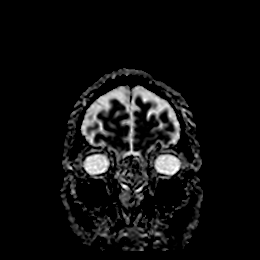
[im 16/32]
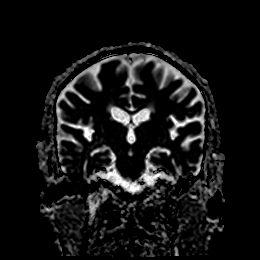
[im 32/32]
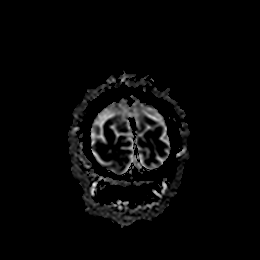

[Series 9: T1 · sagittal · 5.0mm · 0.75mm/px · 2 of 23 slices shown]
[im 1/23]
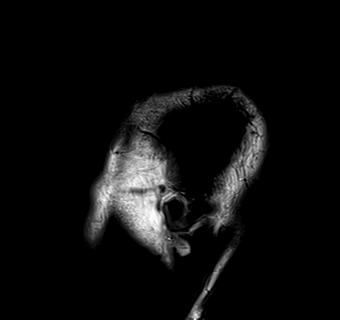
[im 23/23]
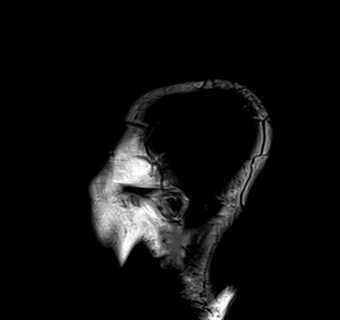

[Series 10: T2 · axial · 5.0mm · 0.72mm/px · z∈[-81,+58]mm · 2 of 25 slices shown (1 of 2)]
[im 1/25]
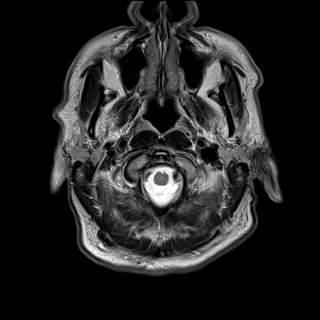
[im 25/25]
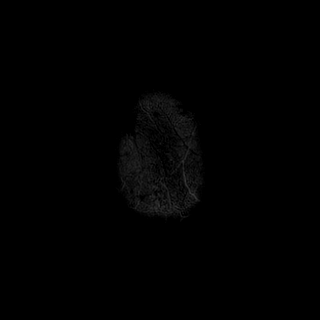

[Series 11: FLAIR · axial · 5.0mm · 0.45mm/px · z∈[-81,+57]mm · 2 of 25 slices shown]
[im 1/25]
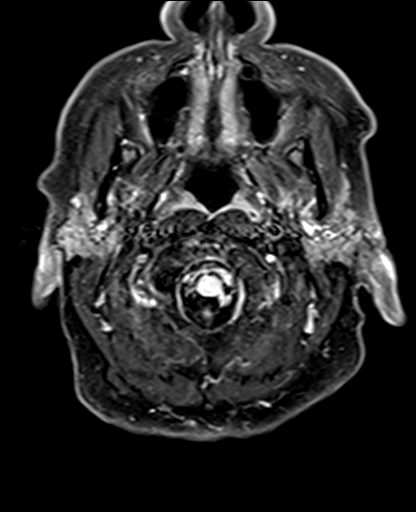
[im 25/25]
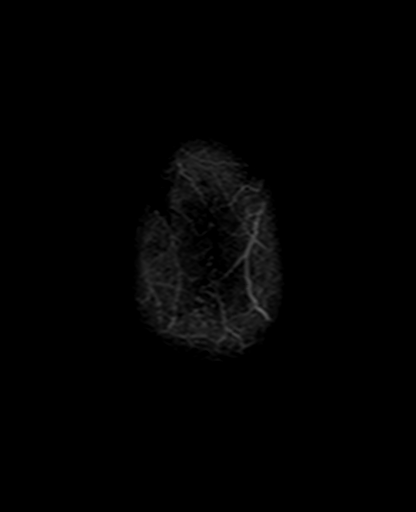

[Series 13: pha_images · axial · 3.0mm · 0.90mm/px · z∈[-97,+64]mm · 5 of 57 slices shown]
[im 1/57]
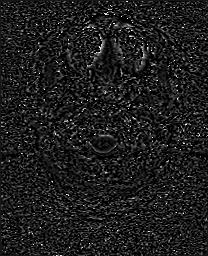
[im 15/57]
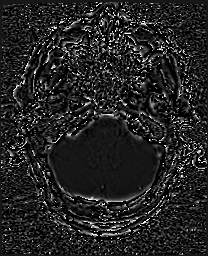
[im 29/57]
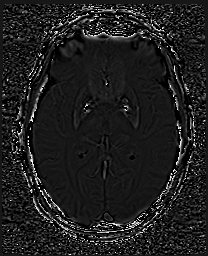
[im 43/57]
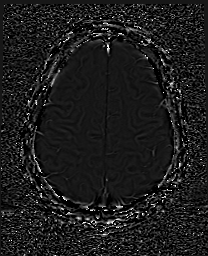
[im 57/57]
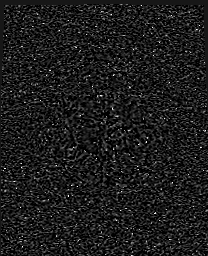

[Series 14: swi_images · axial · 3.0mm · 0.90mm/px · z∈[-97,+73]mm · 5 of 60 slices shown]
[im 1/60]
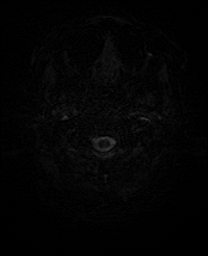
[im 15/60]
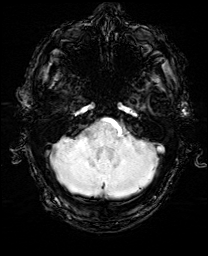
[im 30/60]
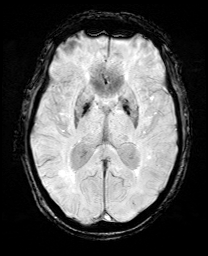
[im 45/60]
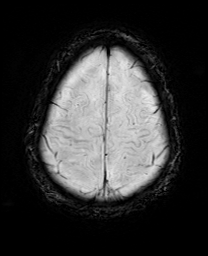
[im 60/60]
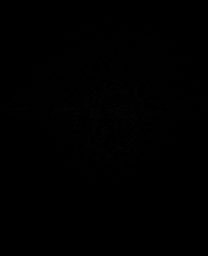

[Series 17: T2 · coronal · 5.0mm · 0.34mm/px · 3 of 29 slices shown (2 of 2)]
[im 1/29]
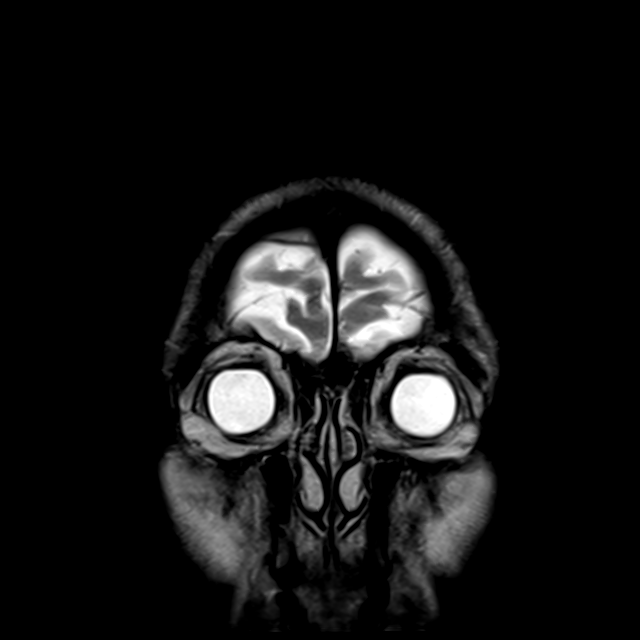
[im 15/29]
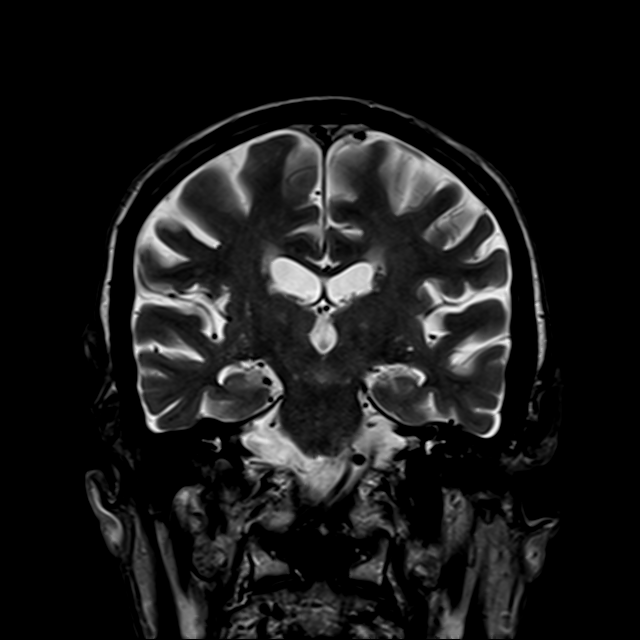
[im 29/29]
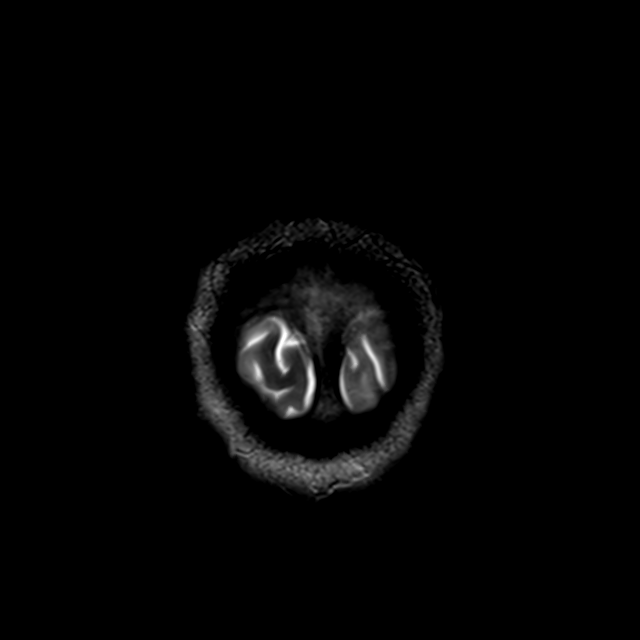

[43 of 48 positions shown; findings below may reference images not displayed]

FINDINGS: Brain: Cerebral volume within normal limits for age. Scattered
patchy T2/FLAIR hyperintensity involving the periventricular and
deep white matter both cerebral hemispheres, most likely related
chronic microvascular ischemic disease, moderate in nature. Mild
patchy involvement of the pons and cerebellum noted as well.

No evidence for acute or subacute infarct. Gray-white matter
differentiation maintained. No areas of chronic cortical infarction.
No acute or chronic intracranial blood products.

No mass lesion, mass effect or midline shift. No hydrocephalus or
extra-axial fluid collection. Pituitary gland suprasellar region
normal.

Vascular: Major intracranial vascular flow voids maintained.

Skull and upper cervical spine: Cranial junction within normal
limits. Bone marrow signal intensity normal. Moderate to advanced
spondylosis noted at C3-4 without high-grade spinal stenosis. No
scalp soft tissue abnormality.

Sinuses/Orbits: Prior bilateral ocular lens replacement. Paranasal
sinuses are largely clear. Trace left mastoid effusion, of doubtful
significance.

Other: Osteoarthritic changes noted about the left TMJ.
IMPRESSION: 1. No acute intracranial abnormality.
2. Moderate chronic microvascular ischemic disease.

## 2024-06-03 ENCOUNTER — Other Ambulatory Visit: Payer: Self-pay | Admitting: Adult Health

## 2024-07-14 ENCOUNTER — Inpatient Hospital Stay: Attending: Physician Assistant

## 2024-07-14 DIAGNOSIS — Z9181 History of falling: Secondary | ICD-10-CM | POA: Diagnosis not present

## 2024-07-14 DIAGNOSIS — I2694 Multiple subsegmental pulmonary emboli without acute cor pulmonale: Secondary | ICD-10-CM

## 2024-07-14 DIAGNOSIS — D61818 Other pancytopenia: Secondary | ICD-10-CM

## 2024-07-14 DIAGNOSIS — D696 Thrombocytopenia, unspecified: Secondary | ICD-10-CM | POA: Diagnosis not present

## 2024-07-14 DIAGNOSIS — I82532 Chronic embolism and thrombosis of left popliteal vein: Secondary | ICD-10-CM | POA: Diagnosis not present

## 2024-07-14 DIAGNOSIS — Z7901 Long term (current) use of anticoagulants: Secondary | ICD-10-CM | POA: Diagnosis not present

## 2024-07-14 DIAGNOSIS — D649 Anemia, unspecified: Secondary | ICD-10-CM | POA: Insufficient documentation

## 2024-07-14 DIAGNOSIS — D72819 Decreased white blood cell count, unspecified: Secondary | ICD-10-CM | POA: Insufficient documentation

## 2024-07-14 DIAGNOSIS — D472 Monoclonal gammopathy: Secondary | ICD-10-CM | POA: Diagnosis not present

## 2024-07-14 DIAGNOSIS — R296 Repeated falls: Secondary | ICD-10-CM | POA: Diagnosis not present

## 2024-07-14 DIAGNOSIS — I82432 Acute embolism and thrombosis of left popliteal vein: Secondary | ICD-10-CM

## 2024-07-14 DIAGNOSIS — I2699 Other pulmonary embolism without acute cor pulmonale: Secondary | ICD-10-CM | POA: Insufficient documentation

## 2024-07-14 LAB — CBC WITH DIFFERENTIAL/PLATELET
Abs Immature Granulocytes: 0.01 K/uL (ref 0.00–0.07)
Basophils Absolute: 0.1 K/uL (ref 0.0–0.1)
Basophils Relative: 1 %
Eosinophils Absolute: 0.1 K/uL (ref 0.0–0.5)
Eosinophils Relative: 2 %
HCT: 37.9 % — ABNORMAL LOW (ref 39.0–52.0)
Hemoglobin: 12.9 g/dL — ABNORMAL LOW (ref 13.0–17.0)
Immature Granulocytes: 0 %
Lymphocytes Relative: 34 %
Lymphs Abs: 1.8 K/uL (ref 0.7–4.0)
MCH: 33.9 pg (ref 26.0–34.0)
MCHC: 34 g/dL (ref 30.0–36.0)
MCV: 99.7 fL (ref 80.0–100.0)
Monocytes Absolute: 0.5 K/uL (ref 0.1–1.0)
Monocytes Relative: 9 %
Neutro Abs: 2.8 K/uL (ref 1.7–7.7)
Neutrophils Relative %: 54 %
Platelets: 98 K/uL — ABNORMAL LOW (ref 150–400)
RBC: 3.8 MIL/uL — ABNORMAL LOW (ref 4.22–5.81)
RDW: 12.9 % (ref 11.5–15.5)
WBC: 5.2 K/uL (ref 4.0–10.5)
nRBC: 0 % (ref 0.0–0.2)

## 2024-07-14 LAB — COMPREHENSIVE METABOLIC PANEL WITH GFR
ALT: 22 U/L (ref 0–44)
AST: 24 U/L (ref 15–41)
Albumin: 4.4 g/dL (ref 3.5–5.0)
Alkaline Phosphatase: 80 U/L (ref 38–126)
Anion gap: 8 (ref 5–15)
BUN: 27 mg/dL — ABNORMAL HIGH (ref 8–23)
CO2: 28 mmol/L (ref 22–32)
Calcium: 9.2 mg/dL (ref 8.9–10.3)
Chloride: 102 mmol/L (ref 98–111)
Creatinine, Ser: 0.96 mg/dL (ref 0.61–1.24)
GFR, Estimated: >60 mL/min (ref >60)
Glucose, Bld: 134 mg/dL — ABNORMAL HIGH (ref 70–99)
Potassium: 4.5 mmol/L (ref 3.5–5.1)
Sodium: 138 mmol/L (ref 135–145)
Total Bilirubin: 0.5 mg/dL (ref 0.0–1.2)
Total Protein: 7.1 g/dL (ref 6.5–8.1)

## 2024-07-14 LAB — IRON AND TIBC
Iron: 113 ug/dL (ref 45–182)
Saturation Ratios: 43 % — ABNORMAL HIGH (ref 17.9–39.5)
TIBC: 263 ug/dL (ref 250–450)
UIBC: 150 ug/dL

## 2024-07-14 LAB — FERRITIN: Ferritin: 369 ng/mL — ABNORMAL HIGH (ref 24–336)

## 2024-07-14 LAB — LACTATE DEHYDROGENASE: LDH: 185 U/L (ref 98–192)

## 2024-07-15 LAB — KAPPA/LAMBDA LIGHT CHAINS
Kappa free light chain: 79.1 mg/L — ABNORMAL HIGH (ref 3.3–19.4)
Kappa, lambda light chain ratio: 4.32 — ABNORMAL HIGH (ref 0.26–1.65)
Lambda free light chains: 18.3 mg/L (ref 5.7–26.3)

## 2024-07-20 LAB — PROTEIN ELECTROPHORESIS, SERUM
A/G Ratio: 1.4 (ref 0.7–1.7)
Albumin ELP: 3.9 g/dL (ref 2.9–4.4)
Alpha-1-Globulin: 0.2 g/dL (ref 0.0–0.4)
Alpha-2-Globulin: 0.7 g/dL (ref 0.4–1.0)
Beta Globulin: 1.4 g/dL — ABNORMAL HIGH (ref 0.7–1.3)
Gamma Globulin: 0.5 g/dL (ref 0.4–1.8)
Globulin, Total: 2.8 g/dL (ref 2.2–3.9)
M-Spike, %: 0.8 g/dL — ABNORMAL HIGH
Total Protein ELP: 6.7 g/dL (ref 6.0–8.5)

## 2024-07-20 NOTE — Progress Notes (Unsigned)
 [facility] [address] [place], [place] [address]   CLINIC:  Medical Oncology/Hematology  PCP:  Merna Huxley, NP [address] [place] [address] [phone]   REASON FOR VISIT:  Follow-up for unprovoked bilateral DVT and bilateral PE + MGUS + thrombocytopenia   CURRENT THERAPY: Eliquis    INTERVAL HISTORY:   Mr. Michael Johnston 89 y.o. male returns for routine follow-up of his DVT and PE, on chronic anticoagulation with Eliquis .  He was last seen by Pleasant Barefoot PA-C on 01/13/2024. He received IV Feraheme x 2 in May 2025.  No interim hospitalizations or major changes in his health since his last visit.   At today's visit, he reports feeling poorly due to ongoing vertigo and gait instability. He has 50% energy and 100% appetite. His weight is stable. He reports unchanged energy after IV iron.  He continues to take Eliquis  5 mg twice daily, and is tolerating this well.   He denies any major bleeding events, epistaxis, hematemesis, or melena; he did note some dark stool from taking iron supplement. He bruises easily, but denies any petechial rash. No symptoms concerning for recurrent DVT/PE.  He continues to fall frequently, about 6 times in the past 6 months.  He has not had any bleeding injuries as a result of falling.  No recent head strikes, and has previously been educated that he needs to proceed to ED for any fall resulting in head strike. Patient attributes his falls to worsening dizziness/vertigo, gait instability, and distal weakness in his hands.  He denies any new bone pain or recent fractures. He has chronic tingling in bilateral fingertips.   No B symptoms.   No masses or lymphadenopathy.  ASSESSMENT & PLAN:  1.  Unprovoked bilateral DVT and bilateral PE - Seen  at the request of hospitalist (Dr. Cherlyn) and PCP (NP Huxley Merna) for evaluation of bilateral DVT and bilateral pulmonary embolism. -  Patient was seen by his PCP on  02/07/2021 for complaints of bilateral lower extremity edema.  D-dimer found to be significantly elevated at 11.01, therefore patient was sent to the ED where he was found to have bilateral age-indeterminate DVT with acute bilateral PE Vascular US  bilateral lower extremity venous (02/08/2022): Age-indeterminate DVT of bilateral lower extremities (right posterior tibial and peroneal veins + left popliteal vein, posterior tibial vein, and left peroneal veins) CTA chest (02/08/2022): Acute subsegmental pulmonary emboli in right upper and lower lobe, small clot burden 2D echo (02/09/2022): LVEF 60 to 65%, mildly reduced RV systolic function - Initially treated with IV heparin  while hospitalized, transitioned to oral Eliquis  at discharge. - Patient remained on Eliquis  through 01/09/2023, and opted to discontinue due to age, fall risk, and concerns regarding cost. - Patient stopped taking Eliquis  as of 01/09/2023 - subsequently presented to the ED on 01/20/2023 with acute left leg swelling and pain.  LLE venous ultrasound (01/20/2023) was positive for DVT in left popliteal vein and posterior tibial vein.  He was restarted on Eliquis  during ED visit on 01/20/2023. - LLE venous US  from 01/20/2023 did not comment on chronicity of DVT of left popliteal vein and posterior tibial vein (and there had been no interval imaging since the first of DVTs were diagnosed in June 2023).  However, this is suspected to be acute recurrent DVT or extension of chronic DVT based on acute symptomatology, elevated D-dimer (0.91 on 01/24/2023), temporal relationship to discontinuation of anticoagulation, and acute drop in platelets. - Venous US  (06/19/2023): No evidence of  DVT in bilateral lower extremities - No obvious provoking factors of his DVT/PE events. - He denies any major bleeding events - Patient has fallen about once a month for the past year, primarily related to losing his balance.  He has a cane, but does not like to use it. - Most  recent D-dimer (06/18/2023): Undetectable at <0.27 - PLAN: Since patient had apparent recurrent DVT after discontinuation of Eliquis , recommend continuing indefinite anticoagulation until such time that risk of bleeding outweighs risk of clotting events. - We discussed risks and benefits of prolonged anticoagulation, including risk of life-threatening bleeding event, which is considered to be less than the risk of recurrent blood clot if he were not on anticoagulation. - We discussed patient's risk of falls and education on home safety was provided.  Patient was STRONGLY recommended to use cane. - Patient instructed to seek medical attention if he has any falls where he strikes his head.  Discussed home safety and fall prevention. - Patient remains eligible for Eliquis  5 mg twice daily dosing (based on age >80 years, we would consider decreasing to 2.5 mg twice daily if he had body weight <60 kg or serum creatinine >1.5 mg) - No utility for hypercoagulable studies, since this would not change management.   2.  MGUS, IgG kappa - Workup of anemia revealed MGUS in October 2024 - Initial MGUS/myeloma labs (06/18/2023): Immunofixation = IgG kappa monoclonal protein SPEP shows M spike = 0.6% Elevated kappa free light chain 72.4 with normal lambda 17.1, elevated FLC ratio 4.23 No CRAB features at this time: Hgb 12.6, creatinine 1.12, calcium  9.6 - Skeletal survey (06/25/2023): No suspicious lytic or sclerotic bone lesion identified - 24-hour urine/UPEP/UIFE (01/06/2024): Urine M spike 9 mg/24 hours.  Bence-Jones protein positive, kappa type Urine M spike = 19 mg/24-hr - Most recent MGUS/myeloma panel (07/14/2024): M spike 0.8%  Elevated kappa light chains 79.1, normal lambda 18.3, elevated FLC ratio 4.32. Hgb 12.9, creatinine 0.96, calcium  9.2, normal LDH. - RISK STRATIFICATION (per IMWG) = LOW-INTERMEDIATE RISK (1/3 risk factors) = (-) Non-IgG M protein, (-) M spike > 1.5, (+) elevated FLC ratio = (21%  risk of progression to MM over 20 years) - No B symptoms, new onset bone pain - Have discussed at length with patient regarding diagnosis and prognosis of MGUS, including 1% chance per year of progression to multiple myeloma - PLAN: MGUS labs overall stable.  No evidence of multiple myeloma at this time. - Recheck MGUS/myeloma labs in 6 months - Repeat 24-hour urine with UPEP/UIFE in 6 months - Consider repeat skeletal survey if any changes in symptoms or labs.  3.  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  Baseline Hgb for the past year around 11.0-13.0. - He had normal B12, MMA, folate, copper , homocystine in January 2024. - Labs from October 2024 showed normal B12, MMA, folate.  Reticulocytes 0.7% (reticulocyte index 0.67, hypoproliferative).  Normal erythropoietin  9.7. - He takes vitamin B12 injection at home every 2 weeks, prescribed by PCP. - He has been taking iron tablet daily since July 2024. - Received IV Feraheme x 2 in May 2025 - Denies any rectal bleeding, melena, or epistaxis. - Most recent labs (07/14/2024): Hgb 11.6/MCV 99.7.   Ferritin 369, iron saturation 43%.  (Labs from October 2024 showed B12 780, MMA normal, folate 15.5.) Normal creatinine and LDH. - DIFFERENTIAL DIAGNOSIS includes iron deficiency anemia, anemia of chronic disease.  Since patient also has having thrombocytopenia  and leukopenia, there is some moderate suspicion that he may have some developing bone marrow infiltrative process such as MDS.   - PLAN: No indication for IV iron at this time.  Patient can HOLD iron tablet at present.   - Repeat labs and RTC in 6 months  - If major deviations from baseline, would consider bone marrow biopsy.  Patient prefers to defer BMBx at this time.  4.  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  Baseline platelets usually >100, but with  transient worsening of thrombocytopenia during hospital stays. - CT abdomen/pelvis (09/15/2020): Liver and spleen unremarkable. - Hematology workup (09/18/2022): CMP unremarkable. Normal copper , folate, homocystine, B12 and MMA. Immature platelet fraction 7.3 Negative ANA and rheumatoid factor. - Reports easy bruising.  No petechial rash, bleeding, or B symptoms. - Acute worsening of thrombocytopenia with platelets 65 on 01/19/2023, likely due to platelet consumption in the setting of acute DVT.  Platelets improved to 111 as of 01/24/2023. - Most recent labs (07/14/2024): Platelets 98.  WBC 5.2 with normal differential.  Normal LDH. - DIFFERENTIAL DIAGNOSIS immune mediated thrombocytopenia, or bone marrow infiltrative process such as MDS - PLAN: No treatment at this time.  Continue surveillance.   - It is reasonable to continue patient on Eliquis  as long as his platelets remain >50,000. - If major deviations from baseline, would consider bone marrow biopsy.  Patient prefers to defer BMBx at this time.  5.  Frequent falls  - Patient reports frequent falls associated with worsening vertigo, gait instability, and progressive distal weakness in hands - PLAN: Referral to neurology for workup and treatment if necessary  6.  Other history - PMH: Coronary artery disease s/p CABG, cystic kidney disease, GERD, hyperlipidemia, skin cancer, sleep apnea (does not use CPAP), and arthritis. - SOCIAL: Patient lives at home with his wife of 67 years.  He is very active.  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  He smoked when he was a teenager.  He denies any alcohol or illicit drug use. - FAMILY: Family history is negative for any blood clots or known hypercoagulable states.  Sister passed away from unspecified cancer.  PLAN SUMMARY:  >> Referral entered to neurology (vertigo, gait instability, distal weakness in hands) >> Labs in 6 months = CBC/D, CMP, LDH, SPEP, light chains,  ferritin, iron/TIBC >> 24-hour urine (UPEP/UIFE) in 6 months >> OFFICE visit in 6 months (1 week after labs)     REVIEW OF SYSTEMS:  Review of Systems  Constitutional:  Positive for fatigue. Negative for appetite change, chills, diaphoresis, fever and unexpected weight change.  HENT:   Negative for lump/mass, nosebleeds and trouble swallowing.   Eyes:  Negative for eye problems.  Respiratory:  Negative for cough, hemoptysis and shortness of breath.   Cardiovascular:  Negative for chest pain, leg swelling and palpitations.  Gastrointestinal:  Negative for abdominal pain, blood in stool, constipation, diarrhea, nausea and vomiting.  Genitourinary:  Negative for hematuria.   Musculoskeletal:  Positive for arthralgias and gait problem (loses balance).  Skin: Negative.   Neurological:  Positive for dizziness, extremity weakness (hand weakness, dropping things frequently), gait problem (loses balance) and numbness (tingling in fingertips). Negative for headaches and light-headedness.  Hematological:  Does not bruise/bleed easily.     PHYSICAL EXAM:  ECOG PERFORMANCE STATUS: 1 - Symptomatic but completely ambulatory  Vitals:   07/21/24 1010 07/21/24 1019  BP: ROLLEN)  161/84 (!) 148/76  Pulse: 64   Resp: 18   Temp: (!) 97.2 F (36.2 C)   SpO2: 96%     Filed Weights   07/21/24 1010  Weight: 171 lb (77.6 kg)    Physical Exam Constitutional:      Appearance: Normal appearance. He is normal weight.  Cardiovascular:     Rate and Rhythm: Normal rate.     Heart sounds: Normal heart sounds.  Pulmonary:     Breath sounds: Normal breath sounds.  Neurological:     General: No focal deficit present.     Mental Status: Mental status is at baseline.  Psychiatric:        Behavior: Behavior normal. Behavior is cooperative.    PAST MEDICAL/SURGICAL HISTORY:  Past Medical History:  Diagnosis Date   Arthritis    right shoulder (05/24/2015)   CHEST PAIN    Chronic bronchitis (HCC)     get it ~ q yr (05/24/2015)   COLONIC POLYPS, HX OF    COPD (chronic obstructive pulmonary disease) (HCC)    dx'd but I don't take RX for it (05/24/2015)   Coronary artery disease    Cystic kidney disease    GERD (gastroesophageal reflux disease)    History of hiatal hernia    Iron deficiency anemia 01/13/2024   MI (myocardial infarction) (HCC) 09/09/2010   Mixed hyperlipidemia    Skin cancer    top of my head only (05/24/2015)   Sleep apnea    Syncope and collapse ~ 2013; 05/24/2015   Past Surgical History:  Procedure Laterality Date   CARDIAC CATHETERIZATION  09/2010   CHOLECYSTECTOMY OPEN  1998   COLECTOMY  1998   partial   CORONARY ANGIOPLASTY     CORONARY ARTERY BYPASS GRAFT  09/2010   Median sternotomy for coronary artery bypass grafting x3  (left internal mammary artery to distal left anterior  descending  coronary artery, saphenous vein graft to first diagonal branch,  saphenous vein graft to first  obtuse marginal branch, endoscopic  saphenous vein harvest from right thigh). SURGEON:  Sudie DEL.  Dusty, MD  ASSISTANT:  Lemond CHARLENA Cera, PA-C  ANESTHESIA:  Alm KYM Laity, MD    LEFT HEART CATH AND CORS/GRAFTS ANGIOGRAPHY N/A 08/22/2017   Procedure: LEFT HEART CATH AND CORS/GRAFTS ANGIOGRAPHY;  Surgeon: Jordan, Peter M, MD;  Location: [facility] INVASIVE CV LAB;  Service: Cardiovascular;  Laterality: N/A;   SKIN CANCER EXCISION     top of my head    SOCIAL HISTORY:  Social History   Socioeconomic History   Marital status: Married    Spouse name: Not on file   Number of children: Not on file   Years of education: Not on file   Highest education level: Not on file  Occupational History   Not on file  Tobacco Use   Smoking status: Former    Current packs/day: 0.00    Average packs/day: 1.5 packs/day for 3.0 years (4.5 ttl pk-yrs)    Types: Cigarettes    Start date: 12/08/1948    Quit date: 12/09/1951    Years since quitting: 72.6   Smokeless tobacco: Never  Vaping Use    Vaping status: Never Used  Substance and Sexual Activity   Alcohol use: Not Currently    Comment: no alcohol since 1953   Drug use: No   Sexual activity: Not on file  Other Topics Concern   Not on file  Social History Narrative   Retired  Is a education officer, 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 [place]    Social Drivers of [employer]: Low Risk  (01/29/2024)   Overall Financial Resource Strain (CARDIA)    Difficulty of Paying Living Expenses: Not hard at all  Food Insecurity: No Food Insecurity (01/29/2024)   Hunger Vital Sign    Worried About Running Out of Food in the Last Year: Never true    Ran Out of Food in the Last Year: Never true  Transportation Needs: No Transportation Needs (01/29/2024)   PRAPARE - Administrator, Civil Service (Medical): No    Lack of Transportation (Non-Medical): No  Physical Activity: Patient Unable To Answer (01/29/2024)   Exercise Vital Sign    Days of Exercise per Week: Patient unable to answer    Minutes of Exercise per Session: Patient unable to answer  Stress: No Stress Concern Present (01/29/2024)   [employer] of Occupational Health - Occupational Stress Questionnaire    Feeling of Stress : Not at all  Social Connections: Unknown (01/29/2024)   Social Connection and Isolation Panel    Frequency of Communication with Friends and Family: Patient unable to answer    Frequency of Social Gatherings with Friends and Family: Patient unable to answer    Attends Religious Services: More than 4 times per year    Active Member of [employer] or Organizations: Yes    Attends Engineer, Structural: More than 4 times per year    Marital Status: Married  Catering Manager Violence: Not At Risk (01/29/2024)   Humiliation, Afraid, Rape, and Kick questionnaire    Fear of Current or Ex-Partner: No    Emotionally Abused: No    Physically Abused: No    Sexually Abused: No    FAMILY HISTORY:  Family History  Problem Relation Age of Onset    Lung cancer Sister    Heart attack Father     CURRENT MEDICATIONS:  Outpatient Encounter Medications as of 07/21/2024  Medication Sig Note   aspirin  81 MG tablet Take 81 mg by mouth daily. 02/22/2023: Took 4 tablets today10.20 am ( 02/22/23)   atorvastatin  (LIPITOR) 20 MG tablet TAKE 1 TABLET BY MOUTH EVERY DAY    cyanocobalamin  (VITAMIN B12) 1000 MCG/ML injection INJECT 1 ML INTRAMUSCULARLY EVERY OTHER WEEK    diclofenac  Sodium (VOLTAREN ) 1 % GEL APPLY 2 GRAMS TOPICALLY 4 TIMES DAILY AS NEEDED FOR PAIN    ELIQUIS  5 MG TABS tablet TAKE 1 TABLET BY MOUTH TWICE A DAY    EPINEPHrine  0.3 mg/0.3 mL IJ SOAJ injection INJECT 0.3 MG INTO THE MUSCLE ONCE AS NEEDED FOR ANAPHYLAXIS    Ferrous Gluconate-C-Folic Acid  (IRON-C PO) Take by mouth.    ketoconazole  (NIZORAL ) 2 % shampoo USE TWICE WEEKLY    metFORMIN  (GLUCOPHAGE ) 500 MG tablet Take 1 tablet (500 mg total) by mouth daily with breakfast.    metoprolol  tartrate (LOPRESSOR ) 25 MG tablet TAKE 1 TABLET BY MOUTH TWICE A DAY    nitroGLYCERIN  (NITROSTAT ) 0.4 MG SL tablet Place 1 tablet (0.4 mg total) under the tongue every 5 (five) minutes as needed for chest pain (3 doses max).    SYRINGE-NEEDLE, DISP, 3 ML (B-D 3CC LUER-LOK SYR 25GX1) 25G X 1 3 ML MISC USE AS DIRECTED TO INJECT B12    No facility-administered encounter medications on file as of 07/21/2024.    ALLERGIES:  Allergies  Allergen Reactions   Levaquin  [Levofloxacin  In D5w] Swelling and Other (See Comments)    Eyes  swollen   Other Anaphylaxis and Other (See Comments)    Bee sting   Ivp Dye [Iodinated Contrast Media] Other (See Comments)    Patient reports seizure   Metformin  And Related Nausea Only    LABORATORY DATA:  I have reviewed the labs as listed.  CBC    Component Value Date/Time   WBC 5.2 07/14/2024 0955   RBC 3.80 (L) 07/14/2024 0955   HGB 12.9 (L) 07/14/2024 0955   HCT 37.9 (L) 07/14/2024 0955   PLT 98 (L) 07/14/2024 0955   MCV 99.7 07/14/2024 0955   MCH 33.9  07/14/2024 0955   MCHC 34.0 07/14/2024 0955   RDW 12.9 07/14/2024 0955   LYMPHSABS 1.8 07/14/2024 0955   MONOABS 0.5 07/14/2024 0955   EOSABS 0.1 07/14/2024 0955   BASOSABS 0.1 07/14/2024 0955      Latest Ref Rng & Units 07/14/2024    9:55 AM 01/06/2024    1:01 PM 12/28/2023    5:44 PM  CMP  Glucose 70 - 99 mg/dL 865  839  822   BUN 8 - 23 mg/dL 27  27  32   Creatinine 0.61 - 1.24 mg/dL 9.03  9.11  9.09   Sodium 135 - 145 mmol/L 138  136  139   Potassium 3.5 - 5.1 mmol/L 4.5  4.2  4.1   Chloride 98 - 111 mmol/L 102  103  103   CO2 22 - 32 mmol/L 28  25  29    Calcium  8.9 - 10.3 mg/dL 9.2  9.3  9.2   Total Protein 6.5 - 8.1 g/dL 7.1  6.6  6.4   Total Bilirubin 0.0 - 1.2 mg/dL 0.5  0.5  0.4   Alkaline Phos 38 - 126 U/L 80  62  57   AST 15 - 41 U/L 24  19  17    ALT 0 - 44 U/L 22  17  15      DIAGNOSTIC IMAGING:  I have independently reviewed the relevant imaging and discussed with the patient.   WRAP UP:  All questions were answered. The patient knows to call the clinic with any problems, questions or concerns.  Medical decision making: Moderate  Time spent on visit: I spent 20 minutes counseling the patient face to face. The total time spent in the appointment was 30 minutes and more than 50% was on counseling.  Pleasant CHRISTELLA Barefoot, PA-C  07/21/24 10:55 AM

## 2024-07-21 ENCOUNTER — Inpatient Hospital Stay: Admitting: Physician Assistant

## 2024-07-21 VITALS — BP 148/76 | HR 64 | Temp 97.2°F | Resp 18 | Ht 69.5 in | Wt 171.0 lb

## 2024-07-21 DIAGNOSIS — I2699 Other pulmonary embolism without acute cor pulmonale: Secondary | ICD-10-CM | POA: Diagnosis not present

## 2024-07-21 DIAGNOSIS — D472 Monoclonal gammopathy: Secondary | ICD-10-CM

## 2024-07-21 DIAGNOSIS — D696 Thrombocytopenia, unspecified: Secondary | ICD-10-CM | POA: Diagnosis not present

## 2024-07-21 DIAGNOSIS — I82432 Acute embolism and thrombosis of left popliteal vein: Secondary | ICD-10-CM | POA: Diagnosis not present

## 2024-07-21 DIAGNOSIS — I2694 Multiple subsegmental pulmonary emboli without acute cor pulmonale: Secondary | ICD-10-CM

## 2024-07-21 DIAGNOSIS — R296 Repeated falls: Secondary | ICD-10-CM

## 2024-07-21 DIAGNOSIS — D61818 Other pancytopenia: Secondary | ICD-10-CM | POA: Diagnosis not present

## 2024-07-21 DIAGNOSIS — D649 Anemia, unspecified: Secondary | ICD-10-CM

## 2024-07-21 DIAGNOSIS — Z7901 Long term (current) use of anticoagulants: Secondary | ICD-10-CM

## 2024-07-21 LAB — UPEP/UIFE/LIGHT CHAINS/TP, 24-HR UR
% BETA, Urine: 26 %
ALPHA 1 URINE: 2 %
Albumin, U: 33.8 %
Alpha 2, Urine: 12.9 %
Free Kappa Lt Chains,Ur: 37.21 mg/L (ref 1.17–86.46)
Free Kappa/Lambda Ratio: 24.16 — ABNORMAL HIGH (ref 1.83–14.26)
Free Lambda Lt Chains,Ur: 1.54 mg/L (ref 0.27–15.21)
GAMMA GLOBULIN URINE: 25.3 %
M-SPIKE %, Urine: 15.1 % — ABNORMAL HIGH
M-Spike, Mg/24 Hr: 9 mg/(24.h) — ABNORMAL HIGH
Total Protein, Urine-Ur/day: 58 mg/(24.h) (ref 30–150)
Total Protein, Urine: 5.5 mg/dL
Total Volume: 1050

## 2024-07-21 NOTE — Patient Instructions (Addendum)
 [facility] at [facility] **VISIT SUMMARY & IMPORTANT INSTRUCTIONS **   You were seen today by Pleasant Barefoot PA-C for your MGUS, blood clot, and low blood cells.    BLOOD CLOTS Continue to take Eliquis  5 mg twice daily. Taking Eliquis  does come with increased risk of bleeding.  It is extremely important that you practice safety at home to avoid falls. If you have any major bleeding events or fall and hit your head, seek immediate medical attention. Please see attached handout for additional information regarding Eliquis  and home safety.  LOW PLATELETS/WHITE BLOOD CELLS & ANEMIA You continue to have mildly low platelets and mild anemia. This may be evidence of a developing bone marrow disorder (such as possible MDS), but you do not need any treatment for your low platelets or anemia at this time. We will continue to monitor your platelets and anemia follow-up visits. Consider bone marrow biopsy if your blood levels get worse.  IRON DEFICIENCY: Your iron levels are much better after your IV iron. You can STOP taking your iron pill for the time being.  We will let you know if/when you need to restart it.  MGUS (monoclonal gammopathy of undetermined significance) As we discussed, this is a precancerous condition where you have slightly increased plasma cells making a slightly increased amount of abnormal immunoglobulin proteins. Although MGUS is not causing any current problems in your body, it has a 1% each year risk of progression to multiple myeloma cancer. We will continue to monitor your labs every 6 months. We will also check your urine study again in 6 months. Please bring completed urine kit to your lab appointment in 6 months.  FOLLOW-UP APPOINTMENT: 6 months  ** Thank you for trusting me with your healthcare!  I strive to provide all of my patients with quality care at each visit.  If you receive a survey for this visit, I would be so grateful to you  for taking the time to provide feedback.  Thank you in advance!  ~ Cayce Paschal                                        Dr. Mickiel Davonna Pleasant Barefoot, PA-C    Delon Hope, NP   - - - - - - - - - - - - - - - - - -     Thank you for choosing [facility] at [facility] to provide your oncology and hematology care.  To afford each patient quality time with our provider, please arrive at least 15 minutes before your scheduled appointment time.   If you have a lab appointment with the Cancer Center please come in thru the Main Entrance and check in at the main information desk.  You need to re-schedule your appointment should you arrive 10 or more minutes late.  We strive to give you quality time with our providers, and arriving late affects you and other patients whose appointments are after yours.  Also, if you no show three or more times for appointments you may be dismissed from the clinic at the providers discretion.     Again, thank you for choosing [facility].  Our hope is that these requests will decrease the amount of time that you wait before being seen by our physicians.  _____________________________________________________________  Should you have questions after your visit to [facility], please contact our office at [phone] and follow the prompts.  Our office hours are 8:00 a.m. and 4:30 p.m. Monday - Friday.  Please note that voicemails left after 4:00 p.m. may not be returned until the following business day.  We are closed weekends and major holidays.  You do have access to a nurse 24-7, just call the main number to the clinic [phone] and do not press any options, hold on the line and a nurse will answer the phone.    For prescription refill requests, have your pharmacy contact our office and allow 72 hours.

## 2024-08-19 ENCOUNTER — Other Ambulatory Visit: Payer: Self-pay | Admitting: Adult Health

## 2024-08-19 DIAGNOSIS — I2510 Atherosclerotic heart disease of native coronary artery without angina pectoris: Secondary | ICD-10-CM

## 2024-08-19 DIAGNOSIS — I10 Essential (primary) hypertension: Secondary | ICD-10-CM

## 2024-09-06 ENCOUNTER — Encounter: Payer: Self-pay | Admitting: *Deleted

## 2024-09-21 ENCOUNTER — Other Ambulatory Visit: Payer: Self-pay | Admitting: Adult Health

## 2024-09-21 DIAGNOSIS — E538 Deficiency of other specified B group vitamins: Secondary | ICD-10-CM

## 2024-09-22 ENCOUNTER — Encounter: Payer: Self-pay | Admitting: Adult Health

## 2024-09-22 ENCOUNTER — Ambulatory Visit: Admitting: Adult Health

## 2024-09-22 VITALS — BP 118/60 | HR 67 | Temp 98.0°F | Ht 69.5 in | Wt 169.0 lb

## 2024-09-22 DIAGNOSIS — H6123 Impacted cerumen, bilateral: Secondary | ICD-10-CM | POA: Diagnosis not present

## 2024-09-22 DIAGNOSIS — Z86711 Personal history of pulmonary embolism: Secondary | ICD-10-CM | POA: Diagnosis not present

## 2024-09-22 DIAGNOSIS — H8113 Benign paroxysmal vertigo, bilateral: Secondary | ICD-10-CM | POA: Diagnosis not present

## 2024-09-22 DIAGNOSIS — Z7984 Long term (current) use of oral hypoglycemic drugs: Secondary | ICD-10-CM | POA: Diagnosis not present

## 2024-09-22 DIAGNOSIS — D472 Monoclonal gammopathy: Secondary | ICD-10-CM

## 2024-09-22 DIAGNOSIS — Z0000 Encounter for general adult medical examination without abnormal findings: Secondary | ICD-10-CM | POA: Diagnosis not present

## 2024-09-22 DIAGNOSIS — R296 Repeated falls: Secondary | ICD-10-CM

## 2024-09-22 DIAGNOSIS — R440 Auditory hallucinations: Secondary | ICD-10-CM

## 2024-09-22 DIAGNOSIS — E538 Deficiency of other specified B group vitamins: Secondary | ICD-10-CM

## 2024-09-22 DIAGNOSIS — N400 Enlarged prostate without lower urinary tract symptoms: Secondary | ICD-10-CM

## 2024-09-22 DIAGNOSIS — I10 Essential (primary) hypertension: Secondary | ICD-10-CM

## 2024-09-22 DIAGNOSIS — H9193 Unspecified hearing loss, bilateral: Secondary | ICD-10-CM

## 2024-09-22 DIAGNOSIS — E782 Mixed hyperlipidemia: Secondary | ICD-10-CM | POA: Diagnosis not present

## 2024-09-22 DIAGNOSIS — E119 Type 2 diabetes mellitus without complications: Secondary | ICD-10-CM

## 2024-09-22 DIAGNOSIS — J302 Other seasonal allergic rhinitis: Secondary | ICD-10-CM | POA: Diagnosis not present

## 2024-09-22 DIAGNOSIS — D508 Other iron deficiency anemias: Secondary | ICD-10-CM

## 2024-09-22 DIAGNOSIS — I2510 Atherosclerotic heart disease of native coronary artery without angina pectoris: Secondary | ICD-10-CM

## 2024-09-22 DIAGNOSIS — G4730 Sleep apnea, unspecified: Secondary | ICD-10-CM

## 2024-09-22 LAB — LIPID PANEL
Cholesterol: 128 mg/dL (ref 28–200)
HDL: 60.8 mg/dL
LDL Cholesterol: 55 mg/dL (ref 10–99)
NonHDL: 66.83
Total CHOL/HDL Ratio: 2
Triglycerides: 61 mg/dL (ref 10.0–149.0)
VLDL: 12.2 mg/dL (ref 0.0–40.0)

## 2024-09-22 LAB — HEMOGLOBIN A1C: Hgb A1c MFr Bld: 7.1 % — ABNORMAL HIGH (ref 4.6–6.5)

## 2024-09-22 LAB — MICROALBUMIN / CREATININE URINE RATIO
Creatinine,U: 101.4 mg/dL
Microalb Creat Ratio: UNDETERMINED mg/g (ref 0.0–30.0)
Microalb, Ur: <0.7 mg/dL

## 2024-09-22 LAB — TSH: TSH: 3.44 u[IU]/mL (ref 0.35–5.50)

## 2024-09-22 LAB — VITAMIN B12: Vitamin B-12: 671 pg/mL (ref 211–911)

## 2024-09-22 MED ORDER — LORATADINE 10 MG PO TABS: 10.0000 mg | ORAL_TABLET | Freq: Every day | ORAL | 3 refills | Status: AC

## 2024-09-22 NOTE — Progress Notes (Signed)
 "  Subjective:    Patient ID: Michael Johnston, male    DOB: 10/13/1931, 89 y.o.   MRN: [id number]  HPI Patient presents for yearly preventative medicine examination. He is a pleasant 89 year old male who  has a past medical history of Arthritis, CHEST PAIN, Chronic bronchitis (HCC), COLONIC POLYPS, HX OF, COPD (chronic obstructive pulmonary disease) (HCC), Coronary artery disease, Cystic kidney disease, GERD (gastroesophageal reflux disease), History of hiatal hernia, Iron deficiency anemia (01/13/2024), MI (myocardial infarction) (HCC) (09/09/2010), Mixed hyperlipidemia, Skin cancer, Sleep apnea, and Syncope and collapse (~ 2013; 05/24/2015).  Diabetes mellitus type 2-managed with metformin  500 mg daily.  He does not check his blood sugar at home on a routine basis but denies episodes of hypoglycemia.  He denies nausea, vomiting, diarrhea. Lab Results  Component Value Date   HGBA1C 6.8 (A) 02/24/2024   HGBA1C 6.7 (A) 11/26/2023   HGBA1C 7.3 (H) 08/26/2023   Hypertension-managed with metoprolol  25 mg twice daily.  He denies dizziness, lightheadedness, chest pain, or shortness of breath. BP Readings from Last 3 Encounters:  09/22/24 118/60  07/21/24 (!) 148/76  04/30/24 134/80   Hyperlipidemia-managed with Lipitor 20 mg daily and aspirin  81 mg.  He denies myalgia or fatigue Lab Results  Component Value Date   CHOL 107 08/26/2023   HDL 52.50 08/26/2023   LDLCALC 39 08/26/2023   TRIG 80.0 08/26/2023   CHOLHDL 2 08/26/2023   CAD status post CABG-managed by cardiology.  Prescribed Lipitor 20 mg daily and aspirin  81 mg.  He has not had any chest pain, palpitations, shortness of breath, claudication symptoms or DOE.  B12 deficiency-receives monthly B12 injections. Lab Results  Component Value Date   VITAMINB12 855 08/26/2023    H/o PE -in 02/2022 -bilateral knees with subsegmental pulmonary emboli in the right upper and lower lobe with a small clot burden.  He was treated with IV  heparin  while hospitalized and transition to oral Eliquis  at discharge.  He denies any issues with bleeding.  Currently continues on Eliquis  5mg  twice daily.    MGUS , IgG, kappa - Work up for anemia in Oct 2024 revealed MGUS.  Initial MGUS/myeloma labs (06/18/2023): Immunofixation = IgG kappa monoclonal protein SPEP shows M spike = 0.6% Elevated kappa free light chain 72.4 with normal lambda 17.1, elevated FLC ratio 4.23 No CRAB features at this time: Hgb 12.6, creatinine 1.12, calcium  9.6 - Skeletal survey (06/25/2023): No suspicious lytic or sclerotic bone lesion identified - Urine immunofixation was Bence-Jones protein positive, kappa type with urine free light chain ratio 20.59.  Urine total protein 67 mg/24-hr  Iron Deficiency Anemia - taking oral iron supplement 2-3 times a week. Could not tolerate daily due to bowel issues. Lab Results  Component Value Date   IRON 113 07/14/2024   TIBC 263 07/14/2024   FERRITIN 369 (H) 07/14/2024   Frequent Falls - he mentioned this to his oncologist back in November. He has had frequent falls associated with worsening vertigo, gait instability and progressive weakness in his hands. He was referred to Neurology at this time but refused the appointment   Hearing loss - He reports that over the last few months he has been experiencing  people singing every night when he goes to bed. This will eventually stop and then restart. He also feels like he has pressure behind the ears. He does have chronic hearing loss and does not wear his hearing aids. He feels as though his hearing  has gotten any worse.  Additionally he reports that his wife tells him that he stops breathing when he is sleeping. He does report snoring very loudly. Does not necessarily have fatigue. When we discussed this he reports being diagnosed with sleep apnea  along time ago but does not wear a CPAP.   He also needs new referrals to all of his specialists - insurance is requiring.    All immunizations and health maintenance protocols were reviewed with the patient and needed orders were placed.  Appropriate screening laboratory values were ordered for the patient including screening of hyperlipidemia, renal function and hepatic function. If indicated by BPH, a PSA was ordered.  Medication reconciliation,  past medical history, social history, problem list and allergies were reviewed in detail with the patient  Goals were established with regard to weight loss, exercise, and  diet in compliance with medications Wt Readings from Last 3 Encounters:  09/22/24 169 lb (76.7 kg)  07/21/24 171 lb (77.6 kg)  04/30/24 167 lb (75.8 kg)    Review of Systems  Constitutional: Negative.   HENT:  Positive for hearing loss.   Eyes: Negative.   Respiratory: Negative.    Cardiovascular: Negative.   Gastrointestinal: Negative.   Endocrine: Negative.   Genitourinary: Negative.   Musculoskeletal:  Positive for arthralgias and back pain.  Skin: Negative.   Allergic/Immunologic: Negative.   Neurological: Negative.   Hematological: Negative.   Psychiatric/Behavioral:  Positive for hallucinations.    Past Medical History:  Diagnosis Date   Arthritis    right shoulder (05/24/2015)   CHEST PAIN    Chronic bronchitis (HCC)    get it ~ q yr (05/24/2015)   COLONIC POLYPS, HX OF    COPD (chronic obstructive pulmonary disease) (HCC)    dx'd but I don't take RX for it (05/24/2015)   Coronary artery disease    Cystic kidney disease    GERD (gastroesophageal reflux disease)    History of hiatal hernia    Iron deficiency anemia 01/13/2024   MI (myocardial infarction) (HCC) 09/09/2010   Mixed hyperlipidemia    Skin cancer    top of my head only (05/24/2015)   Sleep apnea    Syncope and collapse ~ 2013; 05/24/2015    Social History   Socioeconomic History   Marital status: Married    Spouse name: Not on file   Number of children: Not on file   Years of education: Not  on file   Highest education level: Not on file  Occupational History   Not on file  Tobacco Use   Smoking status: Former    Current packs/day: 0.00    Average packs/day: 1.5 packs/day for 3.0 years (4.5 ttl pk-yrs)    Types: Cigarettes    Start date: 12/08/1948    Quit date: 12/09/1951    Years since quitting: 72.8   Smokeless tobacco: Never  Vaping Use   Vaping status: Never Used  Substance and Sexual Activity   Alcohol use: Not Currently    Comment: no alcohol since 1953   Drug use: No   Sexual activity: Not on file  Other Topics Concern   Not on file  Social History Narrative   Retired    Is a education officer, 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 [place]    Social Drivers of Health   Tobacco Use: Medium Risk (09/22/2024)   Patient History    Smoking Tobacco Use: Former    Smokeless Tobacco Use: Never    Passive Exposure: Not on Actuary  Strain: Low Risk (01/29/2024)   Overall Financial Resource Strain (CARDIA)    Difficulty of Paying Living Expenses: Not hard at all  Food Insecurity: No Food Insecurity (01/29/2024)   Hunger Vital Sign    Worried About Running Out of Food in the Last Year: Never true    Ran Out of Food in the Last Year: Never true  Transportation Needs: No Transportation Needs (01/29/2024)   PRAPARE - Administrator, Civil Service (Medical): No    Lack of Transportation (Non-Medical): No  Physical Activity: Patient Unable To Answer (01/29/2024)   Exercise Vital Sign    Days of Exercise per Week: Patient unable to answer    Minutes of Exercise per Session: Patient unable to answer  Stress: No Stress Concern Present (01/29/2024)   [employer] of Occupational Health - Occupational Stress Questionnaire    Feeling of Stress : Not at all  Social Connections: Unknown (01/29/2024)   Social Connection and Isolation Panel    Frequency of Communication with Friends and Family: Patient unable to answer    Frequency of Social Gatherings with Friends and Family:  Patient unable to answer    Attends Religious Services: More than 4 times per year    Active Member of [employer] or Organizations: Yes    Attends Banker Meetings: More than 4 times per year    Marital Status: Married  Catering Manager Violence: Not At Risk (01/29/2024)   Humiliation, Afraid, Rape, and Kick questionnaire    Fear of Current or Ex-Partner: No    Emotionally Abused: No    Physically Abused: No    Sexually Abused: No  Depression (PHQ2-9): Low Risk (07/21/2024)   Depression (PHQ2-9)    PHQ-2 Score: 0  Alcohol Screen: Low Risk (01/29/2024)   Alcohol Screen    Last Alcohol Screening Score (AUDIT): 0  Housing: Low Risk (01/29/2024)   Housing Stability Vital Sign    Unable to Pay for Housing in the Last Year: No    Number of Times Moved in the Last Year: 0    Homeless in the Last Year: No  Utilities: Not At Risk (01/29/2024)   AHC Utilities    Threatened with loss of utilities: No  Health Literacy: Adequate Health Literacy (01/29/2024)   B1300 Health Literacy    Frequency of need for help with medical instructions: Rarely    Past Surgical History:  Procedure Laterality Date   CARDIAC CATHETERIZATION  09/2010   CHOLECYSTECTOMY OPEN  1998   COLECTOMY  1998   partial   CORONARY ANGIOPLASTY     CORONARY ARTERY BYPASS GRAFT  09/2010   Median sternotomy for coronary artery bypass grafting x3  (left internal mammary artery to distal left anterior  descending  coronary artery, saphenous vein graft to first diagonal branch,  saphenous vein graft to first  obtuse marginal branch, endoscopic  saphenous vein harvest from right thigh). SURGEON:  Sudie DEL.  Dusty, MD  ASSISTANT:  Lemond CHARLENA Cera, PA-C  ANESTHESIA:  Alm KYM Laity, MD    LEFT HEART CATH AND CORS/GRAFTS ANGIOGRAPHY N/A 08/22/2017   Procedure: LEFT HEART CATH AND CORS/GRAFTS ANGIOGRAPHY;  Surgeon: Jordan, Peter M, MD;  Location: [facility] INVASIVE CV LAB;  Service: Cardiovascular;  Laterality: N/A;   SKIN CANCER EXCISION      top of my head    Family History  Problem Relation Age of Onset   Lung cancer Sister    Heart attack Father     Allergies[1]  Medications Ordered Prior to Encounter[2]  BP 118/60   Pulse 67   Temp 98 F (36.7 C) (Oral)   Ht 5' 9.5 (1.765 m)   Wt 169 lb (76.7 kg)   SpO2 98%   BMI 24.60 kg/m       Objective:   Physical Exam Vitals and nursing note reviewed.  Constitutional:      General: He is not in acute distress.    Appearance: Normal appearance. He is not ill-appearing.  HENT:     Head: Normocephalic and atraumatic.     Right Ear: Tympanic membrane, ear canal and external ear normal. There is impacted cerumen.     Left Ear: Tympanic membrane, ear canal and external ear normal. There is impacted cerumen.     Nose: Nose normal. No congestion or rhinorrhea.     Mouth/Throat:     Mouth: Mucous membranes are moist.     Pharynx: Oropharynx is clear.  Eyes:     Extraocular Movements: Extraocular movements intact.     Conjunctiva/sclera: Conjunctivae normal.     Pupils: Pupils are equal, round, and reactive to light.  Neck:     Vascular: No carotid bruit.  Cardiovascular:     Rate and Rhythm: Normal rate and regular rhythm.     Pulses: Normal pulses.     Heart sounds: Normal heart sounds. No murmur heard.    No friction rub. No gallop.  Pulmonary:     Effort: Pulmonary effort is normal.     Breath sounds: Normal breath sounds.  Abdominal:     General: Abdomen is flat. Bowel sounds are normal. There is no distension.     Palpations: Abdomen is soft. There is no mass.     Tenderness: There is no abdominal tenderness. There is no guarding or rebound.     Hernia: No hernia is present.  Musculoskeletal:        General: Normal range of motion.     Cervical back: Normal range of motion and neck supple.  Lymphadenopathy:     Cervical: No cervical adenopathy.  Skin:    General: Skin is warm and dry.     Capillary Refill: Capillary refill takes less than 2  seconds.  Neurological:     General: No focal deficit present.     Mental Status: He is alert and oriented to person, place, and time.  Psychiatric:        Mood and Affect: Mood normal.        Behavior: Behavior normal.        Thought Content: Thought content normal.        Judgment: Judgment normal.        Assessment & Plan:   1. Routine general medical examination at a health care facility (Primary) Today patient counseled on age appropriate routine health concerns for screening and prevention, each reviewed and up to date or declined. Immunizations reviewed and up to date or declined. Labs ordered and reviewed. Risk factors for depression reviewed and negative. Hearing function and visual acuity are intact. ADLs screened and addressed as needed. Functional ability and level of safety reviewed and appropriate. Education, counseling and referrals performed based on assessed risks today. Patient provided with a copy of personalized plan for preventive services.   2. Diabetes mellitus treated with oral medication (HCC) - Consider increase in metformin  - Likely follow up in 6 months  - Lipid panel; Future - TSH; Future - Hemoglobin A1c; Future - Microalbumin/Creatinine Ratio, Urine; Future -  Microalbumin/Creatinine Ratio, Urine - Hemoglobin A1c - TSH - Lipid panel  3. Essential hypertension - Controlled. No change in medication  - Lipid panel; Future - TSH; Future - TSH - Lipid panel  4. Mixed hyperlipidemia - Consider increase in statin  - Lipid panel; Future - TSH; Future - TSH - Lipid panel  5. Coronary artery disease involving native coronary artery of native heart without angina pectoris - Continue with plan of care by cardiology  - Lipid panel; Future - TSH; Future - TSH - Lipid panel - Ambulatory referral to Cardiology  6. B12 deficiency  - Vitamin B12; Future - Vitamin B12  7. History of pulmonary embolism - Continue with Eliquis   - Lipid panel;  Future - TSH; Future - TSH - Lipid panel  8. MGUS (monoclonal gammopathy of unknown significance) - Per oncology  - Lipid panel; Future - TSH; Future - TSH - Lipid panel - Ambulatory referral to Hematology / Oncology  9. Other iron deficiency anemia - Recent labs show stable iron panel.  - Continue with iron supplement  - Lipid panel; Future - TSH; Future - TSH - Lipid panel  10. Frequent falls - Advised that it would be a good idea to have a neurology eval done. Phone number called to schedule - Lipid panel; Future - TSH; Future - TSH - Lipid panel  11. Benign paroxysmal positional vertigo due to bilateral vestibular disorder - Advised that it would be a good idea to have a neurology eval done. Phone number called to schedule  12. Bilateral hearing loss, unspecified hearing loss type  - Ambulatory referral to ENT  13. Auditory hallucinations - Likely due to hearing loss and sensory deprivation  - Ambulatory referral to ENT  14. Bilateral impacted cerumen Warm water was applied and gentle ear lavage performed to bilateral ears. There were no complications and following the disimpaction the tympanic membrane were visible bilaterally. Tympanic membranes are intact following the procedure.  Auditory canals are normal.  The patient reported relief of symptoms after removal of cerumen. Patient tolerated procedure well.    15. Seasonal allergies  - loratadine  (CLARITIN ) 10 MG tablet; Take 1 tablet (10 mg total) by mouth daily.  Dispense: 90 tablet; Refill: 3  16. Sleep apnea, unspecified type  - Ambulatory referral to Pulmonology  17. Benign prostatic hyperplasia, unspecified whether lower urinary tract symptoms present  - Ambulatory referral to Urology   I personally spent a total of 75 minutes in the care of the patient today including preparing to see the patient, getting/reviewing separately obtained history, performing a medically appropriate exam/evaluation,  counseling and educating, placing orders, documenting clinical information in the EHR, communicating results, and this did not include time to remove cerumen impaction.     [1]  Allergies Allergen Reactions   Levaquin  [Levofloxacin  In D5w] Swelling and Other (See Comments)    Eyes swollen   Other Anaphylaxis and Other (See Comments)    Bee sting   Ivp Dye [Iodinated Contrast Media] Other (See Comments)    Patient reports seizure   Metformin  And Related Nausea Only  [2]  Current Outpatient Medications on File Prior to Visit  Medication Sig Dispense Refill   aspirin  81 MG tablet Take 81 mg by mouth daily.     atorvastatin  (LIPITOR) 20 MG tablet TAKE 1 TABLET BY MOUTH EVERY DAY 90 tablet 3   cyanocobalamin  (VITAMIN B12) 1000 MCG/ML injection INJECT 1 ML INTRAMUSCULARLY EVERY OTHER WEEK 3 mL 6   diclofenac  Sodium (VOLTAREN )  1 % GEL APPLY 2 GRAMS TOPICALLY 4 TIMES DAILY AS NEEDED FOR PAIN 200 g 2   ELIQUIS  5 MG TABS tablet TAKE 1 TABLET BY MOUTH TWICE A DAY 180 tablet 0   EPINEPHrine  0.3 mg/0.3 mL IJ SOAJ injection INJECT 0.3 MG INTO THE MUSCLE ONCE AS NEEDED FOR ANAPHYLAXIS 2 each 0   Ferrous Gluconate-C-Folic Acid  (IRON-C PO) Take by mouth.     ketoconazole  (NIZORAL ) 2 % shampoo USE TWICE WEEKLY 120 mL 3   metFORMIN  (GLUCOPHAGE ) 500 MG tablet Take 1 tablet (500 mg total) by mouth daily with breakfast. 90 tablet 1   metoprolol  tartrate (LOPRESSOR ) 25 MG tablet TAKE 1 TABLET BY MOUTH TWICE A DAY 180 tablet 3   nitroGLYCERIN  (NITROSTAT ) 0.4 MG SL tablet Place 1 tablet (0.4 mg total) under the tongue every 5 (five) minutes as needed for chest pain (3 doses max). 25 tablet 1   SYRINGE-NEEDLE, DISP, 3 ML (B-D 3CC LUER-LOK SYR 25GX1) 25G X 1 3 ML MISC USE AS DIRECTED TO INJECT B12 3 each 3   No current facility-administered medications on file prior to visit.   "

## 2024-09-22 NOTE — Patient Instructions (Addendum)
 It was great seeing you today   We will follow up with you regarding your lab work   Please let me know if you need anything   [facility]  Phone: [phone]   I am going to refer you to ENT for a hearing exam and to pulmonary for a sleep study   Trying using a fan or white noise maker in the bedroom   I am also going to send in some claritin  for your allergies

## 2024-09-23 ENCOUNTER — Ambulatory Visit: Payer: Self-pay | Admitting: Adult Health

## 2024-10-13 ENCOUNTER — Encounter: Payer: Self-pay | Admitting: Internal Medicine

## 2024-10-13 ENCOUNTER — Ambulatory Visit: Payer: Self-pay

## 2024-10-13 ENCOUNTER — Ambulatory Visit: Admitting: Internal Medicine

## 2024-10-13 VITALS — BP 130/70 | HR 60 | Temp 98.0°F | Wt 172.9 lb

## 2024-10-13 DIAGNOSIS — J3489 Other specified disorders of nose and nasal sinuses: Secondary | ICD-10-CM

## 2024-10-13 DIAGNOSIS — R5383 Other fatigue: Secondary | ICD-10-CM

## 2024-10-13 DIAGNOSIS — J069 Acute upper respiratory infection, unspecified: Secondary | ICD-10-CM

## 2024-10-13 DIAGNOSIS — J029 Acute pharyngitis, unspecified: Secondary | ICD-10-CM

## 2024-10-13 LAB — POCT RAPID STREP A (OFFICE): Rapid Strep A Screen: NEGATIVE

## 2024-10-13 LAB — POCT INFLUENZA A/B
Influenza A, POC: NEGATIVE
Influenza B, POC: NEGATIVE

## 2024-10-13 LAB — POC COVID19 BINAXNOW: SARS Coronavirus 2 Ag: NEGATIVE

## 2024-10-13 NOTE — Telephone Encounter (Signed)
 FYI Only or Action Required?: FYI only for provider: appointment scheduled on 10/13/24.  Patient was last seen in primary care on 09/22/2024 by Merna Huxley, NP.  Called Nurse Triage reporting URI.  Symptoms began several days ago.  Interventions attempted: Nothing.  Symptoms are: stable.  Triage Disposition: See PCP When Office is Open (Within 3 Days)  Patient/caregiver understands and will follow disposition?: Yes  Message from [facility] sent at 10/13/2024  8:46 AM EST  Reason for Triage: Eye drainage, nasal drainage,  sore throat with pus   Reason for Disposition  [1] Sore throat with cough/cold symptoms AND [2] present > 5 days  Answer Assessment - Initial Assessment Questions 2. ONSET: When did the sinus pain start?  (e.g., hours, days)      3 days  5. NASAL CONGESTION: Is the nose blocked? If Yes, ask: Can you open it or must you breathe through your mouth?     Yes at night time  6. NASAL DISCHARGE: Do you have discharge from your nose? If so ask, What color?     yes 7. FEVER: Do you have a fever? If Yes, ask: What is it, how was it measured, and when did it start?      no 8. OTHER SYMPTOMS: Do you have any other symptoms? (e.g., sore throat, cough, earache, difficulty breathing)     Sore throat with white pus tonsil  bilat eye drainage  Answer Assessment - Initial Assessment Questions 1. ONSET: When did the throat start hurting? (Hours or days ago)      3 days ago  2. SEVERITY: How bad is the sore throat? (Scale 1-10; mild, moderate or severe)     Mild to moderate  4.  VIRAL SYMPTOMS: Are there any symptoms of a cold, such as a runny nose, cough, hoarse voice or red eyes?      Runny nose, slight cough, watery eyes 5. FEVER: Do you have a fever? If Yes, ask: What is your temperature, how was it measured, and when did it start?     no 6. PUS ON THE TONSILS: Is there pus on the tonsils in the back of your throat?     Yes 1 tonsil white and  swollen  7. OTHER SYMPTOMS: Do you have any other symptoms? (e.g., difficulty breathing, headache, rash)     Nasal congestion at night  Protocols used: Sinus Pain or Congestion-A-AH, Sore Throat-A-AH

## 2024-10-13 NOTE — Progress Notes (Signed)
 "    Established Patient Office Visit     CC/Reason for Visit: URI symptoms, sore throat  HPI: Michael Johnston is a 89 y.o. male who is coming in today for the above mentioned reasons.  Have been present for the past 3 days, no recent travel or sick contacts.  Has been having nasal congestion, runny nose, sore throat.   Past Medical/Surgical History: Past Medical History:  Diagnosis Date   Arthritis    right shoulder (05/24/2015)   CHEST PAIN    Chronic bronchitis (HCC)    get it ~ q yr (05/24/2015)   COLONIC POLYPS, HX OF    COPD (chronic obstructive pulmonary disease) (HCC)    dx'd but I don't take RX for it (05/24/2015)   Coronary artery disease    Cystic kidney disease    GERD (gastroesophageal reflux disease)    History of hiatal hernia    Iron deficiency anemia 01/13/2024   MI (myocardial infarction) (HCC) 09/09/2010   Mixed hyperlipidemia    Skin cancer    top of my head only (05/24/2015)   Sleep apnea    Syncope and collapse ~ 2013; 05/24/2015    Past Surgical History:  Procedure Laterality Date   CARDIAC CATHETERIZATION  09/2010   CHOLECYSTECTOMY OPEN  1998   COLECTOMY  1998   partial   CORONARY ANGIOPLASTY     CORONARY ARTERY BYPASS GRAFT  09/2010   Median sternotomy for coronary artery bypass grafting x3  (left internal mammary artery to distal left anterior  descending  coronary artery, saphenous vein graft to first diagonal branch,  saphenous vein graft to first  obtuse marginal branch, endoscopic  saphenous vein harvest from right thigh). SURGEON:  Sudie DEL.  Dusty, MD  ASSISTANT:  Lemond CHARLENA Cera, PA-C  ANESTHESIA:  Alm KYM Laity, MD    LEFT HEART CATH AND CORS/GRAFTS ANGIOGRAPHY N/A 08/22/2017   Procedure: LEFT HEART CATH AND CORS/GRAFTS ANGIOGRAPHY;  Surgeon: Jordan, Peter M, MD;  Location: [facility] INVASIVE CV LAB;  Service: Cardiovascular;  Laterality: N/A;   SKIN CANCER EXCISION     top of my head    Social History:  reports that he quit  smoking about 72 years ago. His smoking use included cigarettes. He started smoking about 75 years ago. He has a 4.5 pack-year smoking history. He has never used smokeless tobacco. He reports that he does not currently use alcohol. He reports that he does not use drugs.  Allergies: Allergies[1]  Family History:  Family History  Problem Relation Age of Onset   Lung cancer Sister    Heart attack Father     Current Medications[2]  Review of Systems:  Negative unless indicated in HPI.   Physical Exam: Vitals:   10/13/24 1030  BP: 130/70  Pulse: 60  Temp: 98 F (36.7 C)  TempSrc: Oral  SpO2: 98%  Weight: 172 lb 14.4 oz (78.4 kg)    Body mass index is 25.17 kg/m.   Physical Exam Vitals reviewed.  Constitutional:      Appearance: Normal appearance.  HENT:     Right Ear: Tympanic membrane, ear canal and external ear normal.     Left Ear: Tympanic membrane, ear canal and external ear normal.     Mouth/Throat:     Mouth: Mucous membranes are moist.     Pharynx: Posterior oropharyngeal erythema present.  Eyes:     Conjunctiva/sclera: Conjunctivae normal.     Pupils: Pupils are equal, round, and reactive to light.  Cardiovascular:     Rate and Rhythm: Normal rate and regular rhythm.  Pulmonary:     Effort: Pulmonary effort is normal.     Breath sounds: Normal breath sounds.  Neurological:     Mental Status: He is alert.      Impression and Plan:  Viral upper respiratory tract infection  Sore throat -     POC COVID-19 BinaxNow -     POCT Influenza A/B -     POCT rapid strep A  Other fatigue -     POC COVID-19 BinaxNow -     POCT Influenza A/B -     POCT rapid strep A  Nasal and sinus discharge -     POC COVID-19 BinaxNow -     POCT Influenza A/B   - In office flu, COVID and rapid strep tests are negative. -Given exam findings, PNA, pharyngitis, ear infection are not likely, hence abx have not been prescribed. -Have advised rest, fluids, OTC  antihistamines, cough suppressants and mucinex. -RTC if no improvement in 10-14 days.   Time spent:23 minutes reviewing chart, interviewing and examining patient and formulating plan of care.     [name], MD [facility]     [1]  Allergies Allergen Reactions   Levaquin  [Levofloxacin  In D5w] Swelling and Other (See Comments)    Eyes swollen   Other Anaphylaxis and Other (See Comments)    Bee sting   Ivp Dye [Iodinated Contrast Media] Other (See Comments)    Patient reports seizure   Metformin  And Related Nausea Only  [2]  Current Outpatient Medications:    aspirin  81 MG tablet, Take 81 mg by mouth daily., Disp: , Rfl:    atorvastatin  (LIPITOR) 20 MG tablet, TAKE 1 TABLET BY MOUTH EVERY DAY, Disp: 90 tablet, Rfl: 3   cyanocobalamin  (VITAMIN B12) 1000 MCG/ML injection, INJECT 1 ML INTRAMUSCULARLY EVERY OTHER WEEK, Disp: 3 mL, Rfl: 6   diclofenac  Sodium (VOLTAREN ) 1 % GEL, APPLY 2 GRAMS TOPICALLY 4 TIMES DAILY AS NEEDED FOR PAIN, Disp: 200 g, Rfl: 2   ELIQUIS  5 MG TABS tablet, TAKE 1 TABLET BY MOUTH TWICE A DAY, Disp: 180 tablet, Rfl: 0   EPINEPHrine  0.3 mg/0.3 mL IJ SOAJ injection, INJECT 0.3 MG INTO THE MUSCLE ONCE AS NEEDED FOR ANAPHYLAXIS, Disp: 2 each, Rfl: 0   Ferrous Gluconate-C-Folic Acid  (IRON-C PO), Take by mouth., Disp: , Rfl:    ketoconazole  (NIZORAL ) 2 % shampoo, USE TWICE WEEKLY, Disp: 120 mL, Rfl: 3   loratadine  (CLARITIN ) 10 MG tablet, Take 1 tablet (10 mg total) by mouth daily., Disp: 90 tablet, Rfl: 3   metFORMIN  (GLUCOPHAGE ) 500 MG tablet, Take 1 tablet (500 mg total) by mouth daily with breakfast., Disp: 90 tablet, Rfl: 1   metoprolol  tartrate (LOPRESSOR ) 25 MG tablet, TAKE 1 TABLET BY MOUTH TWICE A DAY, Disp: 180 tablet, Rfl: 3   nitroGLYCERIN  (NITROSTAT ) 0.4 MG SL tablet, Place 1 tablet (0.4 mg total) under the tongue every 5 (five) minutes as needed for chest pain (3 doses max)., Disp: 25 tablet, Rfl: 1   SYRINGE-NEEDLE,  DISP, 3 ML (B-D 3CC LUER-LOK SYR 25GX1) 25G X 1 3 ML MISC, USE AS DIRECTED TO INJECT B12, Disp: 3 each, Rfl: 3  "

## 2024-10-14 ENCOUNTER — Institutional Professional Consult (permissible substitution) (INDEPENDENT_AMBULATORY_CARE_PROVIDER_SITE_OTHER): Admitting: Physician Assistant

## 2024-10-27 ENCOUNTER — Institutional Professional Consult (permissible substitution) (INDEPENDENT_AMBULATORY_CARE_PROVIDER_SITE_OTHER): Admitting: Physician Assistant

## 2024-11-25 ENCOUNTER — Ambulatory Visit (HOSPITAL_BASED_OUTPATIENT_CLINIC_OR_DEPARTMENT_OTHER): Admitting: Pulmonary Disease

## 2025-01-18 ENCOUNTER — Inpatient Hospital Stay

## 2025-01-25 ENCOUNTER — Inpatient Hospital Stay: Admitting: Physician Assistant

## 2025-02-02 ENCOUNTER — Ambulatory Visit: Admitting: Diagnostic Neuroimaging
